# Patient Record
Sex: Female | Born: 1950 | Race: Black or African American | Hispanic: No | Marital: Single | Smoking: Former smoker
Health system: Southern US, Community
[De-identification: ages and names within clinical notes are randomized; demographics above are authoritative.]

## PROBLEM LIST (undated history)

## (undated) ENCOUNTER — Emergency Department (HOSPITAL_BASED_OUTPATIENT_CLINIC_OR_DEPARTMENT_OTHER): Admission: EM | Discharge status: Absent without leave | Payer: Medicare Other

## (undated) DIAGNOSIS — E785 Hyperlipidemia, unspecified: Secondary | ICD-10-CM

## (undated) DIAGNOSIS — Z860100 Personal history of colon polyps, unspecified: Secondary | ICD-10-CM

## (undated) DIAGNOSIS — M1990 Unspecified osteoarthritis, unspecified site: Secondary | ICD-10-CM

## (undated) DIAGNOSIS — C50919 Malignant neoplasm of unspecified site of unspecified female breast: Secondary | ICD-10-CM

## (undated) DIAGNOSIS — R9430 Abnormal result of cardiovascular function study, unspecified: Secondary | ICD-10-CM

## (undated) DIAGNOSIS — I219 Acute myocardial infarction, unspecified: Secondary | ICD-10-CM

## (undated) DIAGNOSIS — I10 Essential (primary) hypertension: Secondary | ICD-10-CM

## (undated) DIAGNOSIS — M5430 Sciatica, unspecified side: Secondary | ICD-10-CM

## (undated) DIAGNOSIS — G8929 Other chronic pain: Secondary | ICD-10-CM

## (undated) DIAGNOSIS — Z5189 Encounter for other specified aftercare: Secondary | ICD-10-CM

## (undated) DIAGNOSIS — F1010 Alcohol abuse, uncomplicated: Secondary | ICD-10-CM

## (undated) DIAGNOSIS — M549 Dorsalgia, unspecified: Secondary | ICD-10-CM

## (undated) DIAGNOSIS — Z720 Tobacco use: Secondary | ICD-10-CM

## (undated) DIAGNOSIS — E663 Overweight: Secondary | ICD-10-CM

## (undated) DIAGNOSIS — IMO0001 Reserved for inherently not codable concepts without codable children: Secondary | ICD-10-CM

## (undated) DIAGNOSIS — G4730 Sleep apnea, unspecified: Secondary | ICD-10-CM

## (undated) DIAGNOSIS — I2510 Atherosclerotic heart disease of native coronary artery without angina pectoris: Secondary | ICD-10-CM

## (undated) DIAGNOSIS — R112 Nausea with vomiting, unspecified: Secondary | ICD-10-CM

## (undated) DIAGNOSIS — I509 Heart failure, unspecified: Secondary | ICD-10-CM

## (undated) DIAGNOSIS — Z951 Presence of aortocoronary bypass graft: Secondary | ICD-10-CM

## (undated) DIAGNOSIS — N390 Urinary tract infection, site not specified: Secondary | ICD-10-CM

## (undated) HISTORY — DX: Tobacco use: Z72.0

## (undated) HISTORY — DX: Personal history of colonic polyps: Z86.010

## (undated) HISTORY — DX: Presence of aortocoronary bypass graft: Z95.1

## (undated) HISTORY — DX: Alcohol abuse, uncomplicated: F10.10

## (undated) HISTORY — DX: Encounter for other specified aftercare: Z51.89

## (undated) HISTORY — DX: Nausea with vomiting, unspecified: R11.2

## (undated) HISTORY — DX: Atherosclerotic heart disease of native coronary artery without angina pectoris: I25.10

## (undated) HISTORY — DX: Unspecified osteoarthritis, unspecified site: M19.90

## (undated) HISTORY — DX: Sleep apnea, unspecified: G47.30

## (undated) HISTORY — PX: CORONARY STENT PLACEMENT: SHX1402

## (undated) HISTORY — DX: Hyperlipidemia, unspecified: E78.5

## (undated) HISTORY — DX: Malignant neoplasm of unspecified site of unspecified female breast: C50.919

## (undated) HISTORY — DX: Personal history of colon polyps, unspecified: Z86.0100

## (undated) HISTORY — DX: Essential (primary) hypertension: I10

## (undated) HISTORY — DX: Overweight: E66.3

## (undated) HISTORY — DX: Abnormal result of cardiovascular function study, unspecified: R94.30

---

## 1981-09-14 HISTORY — PX: GASTRIC BYPASS: SHX52

## 1982-09-14 DIAGNOSIS — Z5189 Encounter for other specified aftercare: Secondary | ICD-10-CM

## 1982-09-14 HISTORY — DX: Encounter for other specified aftercare: Z51.89

## 1985-09-14 HISTORY — PX: MASTECTOMY: SHX3

## 1987-09-15 HISTORY — PX: ABDOMINAL HYSTERECTOMY: SHX81

## 2004-03-19 ENCOUNTER — Emergency Department (HOSPITAL_COMMUNITY): Admission: EM | Admit: 2004-03-19 | Discharge: 2004-03-19 | Payer: Self-pay | Admitting: Emergency Medicine

## 2004-06-14 ENCOUNTER — Emergency Department (HOSPITAL_COMMUNITY): Admission: EM | Admit: 2004-06-14 | Discharge: 2004-06-15 | Payer: Self-pay | Admitting: Emergency Medicine

## 2004-09-04 ENCOUNTER — Emergency Department (HOSPITAL_COMMUNITY): Admission: EM | Admit: 2004-09-04 | Discharge: 2004-09-04 | Payer: Self-pay | Admitting: Emergency Medicine

## 2005-03-11 ENCOUNTER — Emergency Department (HOSPITAL_COMMUNITY): Admission: EM | Admit: 2005-03-11 | Discharge: 2005-03-11 | Payer: Self-pay | Admitting: Emergency Medicine

## 2005-03-28 ENCOUNTER — Emergency Department (HOSPITAL_COMMUNITY): Admission: EM | Admit: 2005-03-28 | Discharge: 2005-03-29 | Payer: Self-pay | Admitting: Emergency Medicine

## 2005-04-14 ENCOUNTER — Emergency Department (HOSPITAL_COMMUNITY): Admission: EM | Admit: 2005-04-14 | Discharge: 2005-04-14 | Payer: Self-pay | Admitting: Emergency Medicine

## 2005-07-16 ENCOUNTER — Emergency Department (HOSPITAL_COMMUNITY): Admission: EM | Admit: 2005-07-16 | Discharge: 2005-07-17 | Payer: Self-pay | Admitting: *Deleted

## 2006-05-24 ENCOUNTER — Ambulatory Visit: Payer: Self-pay | Admitting: Cardiology

## 2006-05-24 ENCOUNTER — Inpatient Hospital Stay (HOSPITAL_COMMUNITY): Admission: EM | Admit: 2006-05-24 | Discharge: 2006-06-05 | Payer: Self-pay | Admitting: *Deleted

## 2006-05-26 ENCOUNTER — Encounter: Payer: Self-pay | Admitting: Vascular Surgery

## 2006-05-30 ENCOUNTER — Encounter: Payer: Self-pay | Admitting: Surgery

## 2006-06-17 ENCOUNTER — Ambulatory Visit: Payer: Self-pay | Admitting: Cardiology

## 2006-07-23 ENCOUNTER — Ambulatory Visit: Payer: Self-pay | Admitting: Cardiology

## 2006-10-29 ENCOUNTER — Ambulatory Visit: Payer: Self-pay | Admitting: Nurse Practitioner

## 2006-10-29 ENCOUNTER — Observation Stay (HOSPITAL_COMMUNITY): Admission: EM | Admit: 2006-10-29 | Discharge: 2006-10-30 | Payer: Self-pay | Admitting: Emergency Medicine

## 2006-11-11 ENCOUNTER — Ambulatory Visit: Payer: Self-pay

## 2006-11-16 ENCOUNTER — Ambulatory Visit: Payer: Self-pay

## 2006-11-16 ENCOUNTER — Ambulatory Visit: Payer: Self-pay | Admitting: Cardiology

## 2006-12-02 ENCOUNTER — Encounter (HOSPITAL_COMMUNITY): Admission: RE | Admit: 2006-12-02 | Discharge: 2007-01-24 | Payer: Self-pay | Admitting: Cardiology

## 2007-02-11 ENCOUNTER — Ambulatory Visit: Payer: Self-pay | Admitting: Oncology

## 2007-03-17 LAB — COMPREHENSIVE METABOLIC PANEL
ALT: 12 U/L (ref 0–35)
AST: 15 U/L (ref 0–37)
Albumin: 4.3 g/dL (ref 3.5–5.2)
BUN: 15 mg/dL (ref 6–23)
CO2: 25 mEq/L (ref 19–32)
Calcium: 9.6 mg/dL (ref 8.4–10.5)
Chloride: 107 mEq/L (ref 96–112)
Potassium: 4.2 mEq/L (ref 3.5–5.3)

## 2007-03-17 LAB — CBC WITH DIFFERENTIAL/PLATELET
BASO%: 0.5 % (ref 0.0–2.0)
Basophils Absolute: 0 10*3/uL (ref 0.0–0.1)
EOS%: 0.8 % (ref 0.0–7.0)
HCT: 38.7 % (ref 34.8–46.6)
HGB: 13.2 g/dL (ref 11.6–15.9)
MCH: 28.2 pg (ref 26.0–34.0)
MONO#: 0.5 10*3/uL (ref 0.1–0.9)
NEUT#: 2.7 10*3/uL (ref 1.5–6.5)
RDW: 15.7 % — ABNORMAL HIGH (ref 11.3–14.5)
WBC: 6.6 10*3/uL (ref 3.9–10.0)
lymph#: 3.3 10*3/uL (ref 0.9–3.3)

## 2007-03-17 LAB — LACTATE DEHYDROGENASE: LDH: 209 U/L (ref 94–250)

## 2007-04-18 ENCOUNTER — Ambulatory Visit: Payer: Self-pay | Admitting: Oncology

## 2007-04-28 ENCOUNTER — Encounter: Admission: RE | Admit: 2007-04-28 | Discharge: 2007-04-28 | Payer: Self-pay | Admitting: Oncology

## 2007-07-18 ENCOUNTER — Emergency Department (HOSPITAL_COMMUNITY): Admission: EM | Admit: 2007-07-18 | Discharge: 2007-07-18 | Payer: Self-pay | Admitting: Emergency Medicine

## 2007-07-20 ENCOUNTER — Ambulatory Visit: Payer: Self-pay | Admitting: Oncology

## 2007-08-05 ENCOUNTER — Emergency Department (HOSPITAL_COMMUNITY): Admission: EM | Admit: 2007-08-05 | Discharge: 2007-08-05 | Payer: Self-pay | Admitting: Emergency Medicine

## 2007-09-15 HISTORY — PX: CORONARY ARTERY BYPASS GRAFT: SHX141

## 2007-09-16 ENCOUNTER — Ambulatory Visit: Payer: Self-pay | Admitting: Gastroenterology

## 2007-10-30 ENCOUNTER — Emergency Department (HOSPITAL_COMMUNITY): Admission: EM | Admit: 2007-10-30 | Discharge: 2007-10-30 | Payer: Self-pay | Admitting: Emergency Medicine

## 2007-12-09 ENCOUNTER — Emergency Department (HOSPITAL_COMMUNITY): Admission: EM | Admit: 2007-12-09 | Discharge: 2007-12-09 | Payer: Self-pay | Admitting: Emergency Medicine

## 2007-12-13 ENCOUNTER — Emergency Department (HOSPITAL_COMMUNITY): Admission: EM | Admit: 2007-12-13 | Discharge: 2007-12-14 | Payer: Self-pay | Admitting: Emergency Medicine

## 2008-01-24 ENCOUNTER — Emergency Department (HOSPITAL_COMMUNITY): Admission: EM | Admit: 2008-01-24 | Discharge: 2008-01-24 | Payer: Self-pay | Admitting: Emergency Medicine

## 2008-02-14 ENCOUNTER — Emergency Department (HOSPITAL_COMMUNITY): Admission: EM | Admit: 2008-02-14 | Discharge: 2008-02-14 | Payer: Self-pay | Admitting: Emergency Medicine

## 2008-06-23 ENCOUNTER — Inpatient Hospital Stay (HOSPITAL_COMMUNITY): Admission: AD | Admit: 2008-06-23 | Discharge: 2008-06-24 | Payer: Self-pay | Admitting: Obstetrics & Gynecology

## 2008-07-23 ENCOUNTER — Ambulatory Visit: Payer: Self-pay | Admitting: Oncology

## 2008-07-23 LAB — COMPREHENSIVE METABOLIC PANEL
ALT: 16 U/L (ref 0–35)
BUN: 13 mg/dL (ref 6–23)
CO2: 25 mEq/L (ref 19–32)
Calcium: 9.1 mg/dL (ref 8.4–10.5)
Chloride: 106 mEq/L (ref 96–112)
Creatinine, Ser: 0.92 mg/dL (ref 0.40–1.20)
Glucose, Bld: 125 mg/dL — ABNORMAL HIGH (ref 70–99)
Total Bilirubin: 0.4 mg/dL (ref 0.3–1.2)

## 2008-07-23 LAB — CBC WITH DIFFERENTIAL/PLATELET
Basophils Absolute: 0 10*3/uL (ref 0.0–0.1)
Eosinophils Absolute: 0.1 10*3/uL (ref 0.0–0.5)
HCT: 39.1 % (ref 34.8–46.6)
HGB: 13.1 g/dL (ref 11.6–15.9)
LYMPH%: 47.5 % (ref 14.0–48.0)
MCHC: 33.4 g/dL (ref 32.0–36.0)
MONO#: 0.4 10*3/uL (ref 0.1–0.9)
NEUT%: 42.1 % (ref 39.6–76.8)
Platelets: 183 10*3/uL (ref 145–400)
WBC: 4.8 10*3/uL (ref 3.9–10.0)
lymph#: 2.3 10*3/uL (ref 0.9–3.3)

## 2008-07-23 LAB — LACTATE DEHYDROGENASE: LDH: 200 U/L (ref 94–250)

## 2008-10-19 ENCOUNTER — Ambulatory Visit: Payer: Self-pay | Admitting: Oncology

## 2008-10-23 ENCOUNTER — Ambulatory Visit: Payer: Self-pay | Admitting: Cardiology

## 2008-10-23 LAB — COMPREHENSIVE METABOLIC PANEL
Alkaline Phosphatase: 97 U/L (ref 39–117)
CO2: 25 mEq/L (ref 19–32)
Creatinine, Ser: 0.84 mg/dL (ref 0.40–1.20)
Glucose, Bld: 113 mg/dL — ABNORMAL HIGH (ref 70–99)
Sodium: 139 mEq/L (ref 135–145)
Total Bilirubin: 0.6 mg/dL (ref 0.3–1.2)
Total Protein: 7.1 g/dL (ref 6.0–8.3)

## 2008-10-23 LAB — CBC WITH DIFFERENTIAL/PLATELET
BASO%: 1.2 % (ref 0.0–2.0)
Eosinophils Absolute: 0 10*3/uL (ref 0.0–0.5)
HCT: 38.7 % (ref 34.8–46.6)
LYMPH%: 53.9 % — ABNORMAL HIGH (ref 14.0–48.0)
MCHC: 33.5 g/dL (ref 32.0–36.0)
MCV: 84 fL (ref 81.0–101.0)
MONO%: 6.2 % (ref 0.0–13.0)
NEUT%: 38.1 % — ABNORMAL LOW (ref 39.6–76.8)
Platelets: 237 10*3/uL (ref 145–400)
RBC: 4.61 10*6/uL (ref 3.70–5.32)

## 2008-10-23 LAB — LACTATE DEHYDROGENASE: LDH: 194 U/L (ref 94–250)

## 2008-11-09 ENCOUNTER — Emergency Department (HOSPITAL_COMMUNITY): Admission: EM | Admit: 2008-11-09 | Discharge: 2008-11-09 | Payer: Self-pay | Admitting: Emergency Medicine

## 2008-11-14 ENCOUNTER — Ambulatory Visit: Payer: Self-pay | Admitting: Gastroenterology

## 2008-11-28 ENCOUNTER — Encounter: Payer: Self-pay | Admitting: Gastroenterology

## 2008-11-28 ENCOUNTER — Ambulatory Visit: Payer: Self-pay | Admitting: Gastroenterology

## 2008-12-03 ENCOUNTER — Encounter: Payer: Self-pay | Admitting: Gastroenterology

## 2009-01-17 ENCOUNTER — Ambulatory Visit: Payer: Self-pay | Admitting: Cardiology

## 2009-01-30 ENCOUNTER — Emergency Department (HOSPITAL_COMMUNITY): Admission: EM | Admit: 2009-01-30 | Discharge: 2009-01-30 | Payer: Self-pay | Admitting: Emergency Medicine

## 2009-02-24 ENCOUNTER — Emergency Department (HOSPITAL_COMMUNITY): Admission: EM | Admit: 2009-02-24 | Discharge: 2009-02-24 | Payer: Self-pay | Admitting: Emergency Medicine

## 2009-03-02 ENCOUNTER — Emergency Department (HOSPITAL_COMMUNITY): Admission: EM | Admit: 2009-03-02 | Discharge: 2009-03-03 | Payer: Self-pay | Admitting: Internal Medicine

## 2009-04-25 ENCOUNTER — Ambulatory Visit: Payer: Self-pay | Admitting: Oncology

## 2009-04-29 LAB — COMPREHENSIVE METABOLIC PANEL
AST: 18 U/L (ref 0–37)
Albumin: 4.3 g/dL (ref 3.5–5.2)
Alkaline Phosphatase: 101 U/L (ref 39–117)
Potassium: 4.2 mEq/L (ref 3.5–5.3)
Sodium: 141 mEq/L (ref 135–145)
Total Bilirubin: 0.5 mg/dL (ref 0.3–1.2)
Total Protein: 7 g/dL (ref 6.0–8.3)

## 2009-04-29 LAB — CBC WITH DIFFERENTIAL/PLATELET
BASO%: 0.4 % (ref 0.0–2.0)
EOS%: 0.6 % (ref 0.0–7.0)
Eosinophils Absolute: 0 10*3/uL (ref 0.0–0.5)
LYMPH%: 51.8 % — ABNORMAL HIGH (ref 14.0–49.7)
MCH: 27.8 pg (ref 25.1–34.0)
MCHC: 32.7 g/dL (ref 31.5–36.0)
MCV: 85.1 fL (ref 79.5–101.0)
MONO%: 7.1 % (ref 0.0–14.0)
NEUT#: 2.1 10*3/uL (ref 1.5–6.5)
RBC: 4.52 10*6/uL (ref 3.70–5.45)
RDW: 16.7 % — ABNORMAL HIGH (ref 11.2–14.5)

## 2009-05-16 ENCOUNTER — Observation Stay (HOSPITAL_COMMUNITY): Admission: EM | Admit: 2009-05-16 | Discharge: 2009-05-17 | Payer: Self-pay | Admitting: Emergency Medicine

## 2009-05-16 ENCOUNTER — Ambulatory Visit: Payer: Self-pay | Admitting: Internal Medicine

## 2009-05-22 ENCOUNTER — Telehealth (INDEPENDENT_AMBULATORY_CARE_PROVIDER_SITE_OTHER): Payer: Self-pay | Admitting: *Deleted

## 2009-05-23 ENCOUNTER — Ambulatory Visit: Payer: Self-pay

## 2009-05-27 ENCOUNTER — Encounter (INDEPENDENT_AMBULATORY_CARE_PROVIDER_SITE_OTHER): Payer: Self-pay

## 2009-05-27 ENCOUNTER — Ambulatory Visit: Payer: Self-pay

## 2009-06-02 ENCOUNTER — Encounter: Payer: Self-pay | Admitting: Cardiology

## 2009-06-04 ENCOUNTER — Encounter (INDEPENDENT_AMBULATORY_CARE_PROVIDER_SITE_OTHER): Payer: Self-pay | Admitting: *Deleted

## 2009-06-05 ENCOUNTER — Telehealth (INDEPENDENT_AMBULATORY_CARE_PROVIDER_SITE_OTHER): Payer: Self-pay

## 2009-06-11 ENCOUNTER — Ambulatory Visit: Payer: Self-pay

## 2009-06-11 ENCOUNTER — Encounter: Payer: Self-pay | Admitting: Cardiology

## 2009-07-15 ENCOUNTER — Emergency Department (HOSPITAL_COMMUNITY): Admission: EM | Admit: 2009-07-15 | Discharge: 2009-07-16 | Payer: Self-pay | Admitting: Emergency Medicine

## 2009-08-14 ENCOUNTER — Telehealth (INDEPENDENT_AMBULATORY_CARE_PROVIDER_SITE_OTHER): Payer: Self-pay | Admitting: *Deleted

## 2009-09-11 ENCOUNTER — Emergency Department (HOSPITAL_COMMUNITY): Admission: EM | Admit: 2009-09-11 | Discharge: 2009-09-11 | Payer: Self-pay | Admitting: Emergency Medicine

## 2009-10-28 ENCOUNTER — Ambulatory Visit: Payer: Self-pay | Admitting: Oncology

## 2009-10-31 LAB — COMPREHENSIVE METABOLIC PANEL
ALT: 24 U/L (ref 0–35)
AST: 30 U/L (ref 0–37)
Albumin: 3.9 g/dL (ref 3.5–5.2)
Calcium: 8.6 mg/dL (ref 8.4–10.5)
Chloride: 106 mEq/L (ref 96–112)
Potassium: 4.1 mEq/L (ref 3.5–5.3)
Sodium: 140 mEq/L (ref 135–145)

## 2009-10-31 LAB — CBC WITH DIFFERENTIAL/PLATELET
BASO%: 0.2 % (ref 0.0–2.0)
EOS%: 0.1 % (ref 0.0–7.0)
MCH: 28.5 pg (ref 25.1–34.0)
MCHC: 33.2 g/dL (ref 31.5–36.0)
RDW: 16.5 % — ABNORMAL HIGH (ref 11.2–14.5)
lymph#: 1.1 10*3/uL (ref 0.9–3.3)

## 2009-11-28 ENCOUNTER — Encounter (INDEPENDENT_AMBULATORY_CARE_PROVIDER_SITE_OTHER): Payer: Self-pay | Admitting: *Deleted

## 2009-12-24 ENCOUNTER — Encounter: Payer: Self-pay | Admitting: Cardiology

## 2010-01-15 ENCOUNTER — Encounter: Payer: Self-pay | Admitting: Cardiology

## 2010-01-16 ENCOUNTER — Ambulatory Visit: Payer: Self-pay | Admitting: Cardiology

## 2010-02-20 ENCOUNTER — Ambulatory Visit: Payer: Self-pay | Admitting: Cardiology

## 2010-02-20 ENCOUNTER — Emergency Department (HOSPITAL_COMMUNITY): Admission: EM | Admit: 2010-02-20 | Discharge: 2010-02-20 | Payer: Self-pay | Admitting: Emergency Medicine

## 2010-04-17 ENCOUNTER — Emergency Department (HOSPITAL_COMMUNITY): Admission: EM | Admit: 2010-04-17 | Discharge: 2010-04-18 | Payer: Self-pay | Admitting: Emergency Medicine

## 2010-04-30 ENCOUNTER — Inpatient Hospital Stay (HOSPITAL_COMMUNITY): Admission: AD | Admit: 2010-04-30 | Discharge: 2010-04-30 | Payer: Self-pay | Admitting: Obstetrics & Gynecology

## 2010-06-12 ENCOUNTER — Emergency Department (HOSPITAL_COMMUNITY): Admission: EM | Admit: 2010-06-12 | Discharge: 2010-06-12 | Payer: Self-pay | Admitting: Emergency Medicine

## 2010-07-16 ENCOUNTER — Encounter: Payer: Self-pay | Admitting: Cardiology

## 2010-07-16 ENCOUNTER — Inpatient Hospital Stay (HOSPITAL_COMMUNITY): Admission: AD | Admit: 2010-07-16 | Discharge: 2010-07-20 | Payer: Self-pay | Admitting: Cardiology

## 2010-07-16 ENCOUNTER — Ambulatory Visit: Payer: Self-pay | Admitting: Cardiology

## 2010-07-17 ENCOUNTER — Ambulatory Visit: Payer: Self-pay | Admitting: Internal Medicine

## 2010-07-17 ENCOUNTER — Encounter: Payer: Self-pay | Admitting: Cardiology

## 2010-07-18 ENCOUNTER — Encounter: Payer: Self-pay | Admitting: Gastroenterology

## 2010-07-19 ENCOUNTER — Encounter: Payer: Self-pay | Admitting: Gastroenterology

## 2010-07-21 ENCOUNTER — Emergency Department (HOSPITAL_COMMUNITY): Admission: EM | Admit: 2010-07-21 | Discharge: 2010-07-21 | Payer: Self-pay | Admitting: Emergency Medicine

## 2010-07-21 ENCOUNTER — Encounter: Payer: Self-pay | Admitting: Cardiology

## 2010-08-11 ENCOUNTER — Encounter: Payer: Self-pay | Admitting: Cardiology

## 2010-08-12 ENCOUNTER — Ambulatory Visit: Payer: Self-pay | Admitting: Cardiology

## 2010-10-06 ENCOUNTER — Encounter (HOSPITAL_COMMUNITY): Payer: Self-pay | Admitting: Oncology

## 2010-10-15 NOTE — Consult Note (Signed)
[facility]   Imported By: Roderic Ovens 03/07/2010 16:15:12  _____________________________________________________________________  External Attachment:    Type:   Image     Comment:   External Document

## 2010-10-15 NOTE — Letter (Signed)
Summary: Appointment - Reminder 2  [facility], Main Office  [address]   [place], [place] [address]   Phone: [phone]  Fax: [phone]     November 28, 2009 MRN: [id number]   Patricia Cuevas [address] [place], [place]  [address]   Dear [name],  Our records indicate that it is time to schedule a follow-up appointment with Dr. Myrtis Ser. It is very important that we reach you to schedule this appointment. We look forward to participating in your health care needs. Please contact us at the number listed above at your earliest convenience to schedule your appointment.  If you are unable to make an appointment at this time, give [place] a call so we can update our records.     Sincerely,   [facility] Scheduling Team

## 2010-10-15 NOTE — Assessment & Plan Note (Signed)
Summary: dod add-on CP/lwb  Medications Added HYDROCODONE-ACETAMINOPHEN 10-325 MG TABS (HYDROCODONE-ACETAMINOPHEN) as needed ALBUTEROL SULFATE (2.5 MG/3ML) 0.083% NEBU (ALBUTEROL SULFATE) as needed      Allergies Added: NKDA  Visit Type:  Follow-up Primary Provider:  Ellison Hughs  CC:  Add on - Chest pains.  History of Present Illness: Yesterday she was having quite a bit of pain, with pain in R shoulder radiating to R arm  This was not like when she first had a heart attack.  She recently saw Dr. Myrtis Ser for these symptoms, and she was told to go to the ER.  NTG helped, and then went away.  She has had more problems recently, and the symptoms have been the same.   She sometimes will get short of breath.   She is getting tired more easily.  No pain at rest.  Patient does not smoke.  She stopped smoking about four years ago.    Current Medications (verified): 1)  Lipitor 80 Mg Tabs (Atorvastatin Calcium) .... Take One Tablet By Mouth Daily. 2)  Metoprolol Tartrate 25 Mg Tabs (Metoprolol Tartrate) .... Take One Tablet By Mouth Twice A Day 3)  Lasix 40 Mg Tabs (Furosemide) .[name] Kitchen.. 1 Once Daily 4)  Aspirin 81 Mg Tbec (Aspirin) .... Take One Tablet By Mouth Daily 5)  Lisinopril-Hydrochlorothiazide 20-12.5 Mg Tabs (Lisinopril-Hydrochlorothiazide) .[name] Kitchen.. 1 Once Daily 6)  Metformin Hcl 500 Mg Tabs (Metformin Hcl) .... Take 1 Once Daily 7)  Fosamax 70 Mg Tabs (Alendronate Sodium) .[name] Kitchen.. 1 Q Week 8)  Imdur 60 Mg Xr24h-Tab (Isosorbide Mononitrate) .[name] Kitchen.. 1 Once Daily 9)  Hydrocodone-Acetaminophen 10-325 Mg Tabs (Hydrocodone-Acetaminophen) .... As Needed 10)  Albuterol Sulfate (2.5 Mg/44ml) 0.083% Nebu (Albuterol Sulfate) .... As Needed  Allergies (verified): No Known Drug Allergies  Past History:  Past Medical History: Last updated: 01/16/2010 Dyslipidemia LV FUNCTION....normal  /  No echo data as of Jan 15, 2010 CAD.[name] KitchenMarland KitchenMyoview March, 2008... no scar or ischemia... normal EF /  nuclear...  September, 2010... no scar or ischemia.... ejection fraction 64% CABG  September, 2007.[name] KitchenLIMA to the LAD, SVG to diagonal, sequential SVG OM one and OM 2, sequential SVG distal RCA and PDA diabetes Overweight Tobacco and alcohol abuse in the past.... resolved stomach stapling 1985 followed by weight loss and then return of weight Status post hysterectomy Breast cancer status post bilateral mastectomy Osteoporosis Obstructive sleep apnea.... CPAP being arranged as of May, 2011  Past Surgical History: CABG 2007  Family History: Reviewed history from 12/08/2008 and no changes required. Mother deceased.  Had a history of hypertension,  diabetes, heart disease.  Father deceased secondary to coronary artery  disease.  Siblings:  One sister with coronary artery disease.  Social History: Reviewed history from 12/08/2008 and no changes required. Disabled  Divorced  Tobacco Use - Former.   Vital Signs:  Patient profile:   60 year old female Height:      59 inches Weight:      222 pounds BMI:     45.00 Pulse rate:   62 / minute Pulse rhythm:   regular Resp:     18 per minute BP sitting:   142 / 80  (left arm) Cuff size:   large  Vitals Entered By: Vikki Ports (July 16, 2010 12:03 PM)  Physical Exam  General:  Well developed, well nourished, in no acute distress. Head:  normocephalic and atraumatic Eyes:  PERRLA/EOM intact; conjunctiva and lids normal. Lungs:  Clear bilaterally to auscultation and percussion. Heart:  PMI non  displaced.  Normal S1 and S2.  No murmur Pulses:  pulses normal in all 4 extremities Extremities:  No clubbing or cyanosis. Neurologic:  Alert and oriented x 3.  No focal abnormalities.    EKG  Procedure date:  07/16/2010  Findings:      NSR.  Nonspecific T abnormality.  Impression & Recommendations:  Problem # 1:  CORONARY ARTERY BYPASS GRAFT, HX OF (ICD-V45.81) Prior CABG as noted.  No definite ECG changes.  Needs enzymes and rule out.   Favor restudy to assess coronary anatomy.  Symptoms releived by NTG.  Reviewed risks with patient, and she consents. She was scheduled for an endo tomorrow.    Problem # 2:  * BRADYCARDIA WITH SLEEP STUDY see report  Problem # 3:  DYSLIPIDEMIA (ICD-272.4) on LLT Her updated medication list for this problem includes:    Lipitor 80 Mg Tabs (Atorvastatin calcium) .[name] Kitchen... Take one tablet by mouth daily.  Problem # 4:  DM (ICD-250.00) On metformin.  Will hold for cath.  Her updated medication list for this problem includes:    Aspirin 81 Mg Tbec (Aspirin) .[name] Kitchen... Take one tablet by mouth daily    Lisinopril-hydrochlorothiazide 20-12.5 Mg Tabs (Lisinopril-hydrochlorothiazide) .[name] Kitchen... 1 once daily    Metformin Hcl 500 Mg Tabs (Metformin hcl) .[name] Kitchen... Take 1 once daily

## 2010-10-15 NOTE — Procedures (Signed)
Summary: Upper Endoscopy  Patient: Patricia Cuevas Note: All result statuses are Final unless otherwise noted.  Tests: (1) Upper Endoscopy (EGD)   EGD Upper Endoscopy       DONE     [facility]     [address]     [place], [place]  [address]           ENDOSCOPY PROCEDURE REPORT           PATIENT:  Patricia Cuevas, Patricia Cuevas  MR#:  [id number]     BIRTHDATE:  11-Jan-1951, 59 yrs. old  GENDER:  female           ENDOSCOPIST:  Barbette Hair. Arlyce Dice, MD     Referred by:           PROCEDURE DATE:  07/18/2010     PROCEDURE:  EGD, diagnostic 43235, Maloney Dilation of Esophagus     ASA CLASS:  Class II     INDICATIONS:  chest pain, dysphagia           MEDICATIONS:   Fentanyl 75 mcg IV, Versed 6 mg IV, glycopyrrolate     (Robinal) 0.2 mg IV     TOPICAL ANESTHETIC:  Cetacaine Spray           DESCRIPTION OF PROCEDURE:   After the risks benefits and     alternatives of the procedure were thoroughly explained, informed     consent was obtained.  The EG-[id number] ([id number]) endoscope was     introduced through the mouth and advanced to the , without     limitations.  The instrument was slowly withdrawn as the mucosa     was fully examined.     <<PROCEDUREIMAGES>>           A stricture was found at the gastroesophageal junction. Minimal     narrowing GE junction. Dilation with maloney dilator 18mm Minimal     resistance; no heme  Post-operative change was noted. Gastric     pouch with questionable gastrojejunostomy. Unable to pass scope     through either because of positioning or possibly stenosis (see     image002).    Retroflexed views revealed no abnormalities.    The     scope was then withdrawn from the patient and the procedure     completed.           COMPLICATIONS:  None           ENDOSCOPIC IMPRESSION:     1) Stricture at the gastroesophageal junction     2) Post-operative change - questionable stenosis at     gastrojejunostomy     RECOMMENDATIONS:     1) continue PPI   2) hold fosamax     3) UGI series           REPEAT EXAM:  No           ______________________________     Barbette Hair. Arlyce Dice, MD           CC:  Herby Abraham, MD           n.     Rosalie Doctor:   Barbette Hair. Leiya Keesey at 07/18/2010 03:54 PM           Christoper Allegra, [id number]  Note: An exclamation mark (!) indicates a result that was not dispersed into the flowsheet. Document Creation Date: 07/18/2010 3:55 PM _______________________________________________________________________  (1) Order result status: Final Collection or observation date-time: 07/18/2010 15:46 Requested date-time:  Receipt date-time:  Reported date-time:  Referring Physician:   Ordering Physician: Melvia Heaps [phone]) Specimen Source:  Source: Launa Grill Order Number: [id number] Lab site:

## 2010-10-15 NOTE — Miscellaneous (Signed)
  Clinical Lists Changes  Problems: Added new problem of DM (ICD-250.00) Observations: Added new observation of PAST MED HX: Dyslipidemia LV FUNCTION....normal  /  No echo data as of Jan 15, 2010 CAD.[name] KitchenMarland KitchenMyoview March, 2008... no scar or ischemia... normal EF CABG  September, 2007.[name] KitchenLIMA to the LAD, SVG to diagonal, sequential SVG OM one and OM 2, sequential SVG distal RCA and PDA diabetes Overweight Tobacco and alcohol abuse in the past.... resolved stomach stapling 1985 followed by weight loss and then return of weight Status post hysterectomy Breast cancer status post bilateral mastectomy Osteoporosis (01/15/2010 12:58) Added new observation of PRIMARY MD: Ellison Hughs (01/15/2010 12:58)       Past History:  Past Medical History: Dyslipidemia LV FUNCTION....normal  /  No echo data as of Jan 15, 2010 CAD.[name] KitchenMarland KitchenMyoview March, 2008... no scar or ischemia... normal EF CABG  September, 2007.[name] KitchenLIMA to the LAD, SVG to diagonal, sequential SVG OM one and OM 2, sequential SVG distal RCA and PDA diabetes Overweight Tobacco and alcohol abuse in the past.... resolved stomach stapling 1985 followed by weight loss and then return of weight Status post hysterectomy Breast cancer status post bilateral mastectomy Osteoporosis

## 2010-10-15 NOTE — Cardiovascular Report (Signed)
Summary: Pre Cath/Percutaneous Orders   Pre Cath/Percutaneous Orders   Imported By: Roderic Ovens 07/28/2010 12:22:32  _____________________________________________________________________  External Attachment:    Type:   Image     Comment:   External Document

## 2010-10-15 NOTE — Assessment & Plan Note (Signed)
Summary: eph/blockage/mt   Visit Type:  post hospital visit Primary Adelia Baptista:  Vedia Coffer, MSN, NP  CC:  CAD.  History of Present Illness: The patient is seen for a post hospital visit.  She had been admitted on July 16, 2010.  I have reviewed the hospital records.  She had significant nausea and vomiting.  There was concern that this could be both cardiac or GI.  A cardiac workup included repeat catheterization.  The results are outlined in the past medical history.  The patient did have one occluded vein graft to the diagonal.  Medical therapy was recommended.  She had a complete GI workup.  There were significant GI findings in her meds were adjusted.  She is seen GI followup as an outpatient.  She feels much better.  Current Medications (verified): 1)  Lipitor 80 Mg Tabs (Atorvastatin Calcium) .... Take One Tablet By Mouth Daily. 2)  Metoprolol Tartrate 25 Mg Tabs (Metoprolol Tartrate) .... Take One Tablet By Mouth Twice A Day 3)  Lasix 40 Mg Tabs (Furosemide) .[name] Kitchen.. 1 Once Daily 4)  Aspirin 81 Mg Tbec (Aspirin) .... Take One Tablet By Mouth Daily 5)  Lisinopril-Hydrochlorothiazide 20-12.5 Mg Tabs (Lisinopril-Hydrochlorothiazide) .[name] Kitchen.. 1 Once Daily 6)  Metformin Hcl 500 Mg Tabs (Metformin Hcl) .... Take 1 Once Daily 7)  Imdur 60 Mg Xr24h-Tab (Isosorbide Mononitrate) .[name] Kitchen.. 1 Once Daily 8)  Hydrocodone-Acetaminophen 10-325 Mg Tabs (Hydrocodone-Acetaminophen) .... As Needed 9)  Albuterol Sulfate (2.5 Mg/50ml) 0.083% Nebu (Albuterol Sulfate) .... As Needed 10)  Aciphex 20 Mg Tbec (Rabeprazole Sodium) .... Take 1 Tablet By Mouth Once A Day  Allergies (verified): No Known Drug Allergies  Past History:  Past Medical History: Dyslipidemia LV FUNCTION....normal  /  No echo data as of Jan 15, 2010 /  normal.... catheterization... November, 2011 CAD.[name] KitchenMarland KitchenMyoview March, 2008... no scar or ischemia... normal EF /  nuclear... September, 2010... no scar or ischemia.... ejection fraction 64% /   catheterization November 4,2011..patent LIMA to the LAD .[name] Kitchen small caliber distal vessel with diffuse plaque, patent SVG to first and second OM, patent SVG to PDA and PLA, occluded SVG to diagonal, medical therapy recommended CABG  September, 2007.[name] KitchenLIMA to the LAD, SVG to diagonal, sequential SVG OM one and OM 2, sequential SVG distal RCA and PDA diabetes Overweight Tobacco and alcohol abuse in the past.... resolved stomach stapling 1985 followed by weight loss and then return of weight Status post hysterectomy.. Breast cancer status post bilateral mastectomy Osteoporosis Obstructive sleep apnea.... CPAP being arranged as of May, 2011 Nausea, vomiting,... during hospitalization November, 2011... endoscopy.. 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  All other systems are reviewed and are negative.  Vital Signs:  Patient profile:   60 year old female Height:      59 inches Weight:      222 pounds BMI:     45.00 Pulse rate:   75 / minute BP sitting:   112 / 66  (left arm) Cuff size:   regular  Vitals Entered By: Hardin Negus, RMA (August 12, 2010 10:37 AM)  Physical Exam  General:  patient is stable but overweight. Eyes:  no xanthelasma. Neck:  no jugular distention. Lungs:  lungs are clear.  Respiratory effort  is not labored.   Heart:  cardiac exam reveals S1-S2.  No clicks or significant murmurs. Abdomen:  abdomen soft. Extremities:  no peripheral edema. Psych:  patient is oriented to person time and place.  Affect is normal.   Impression & Recommendations:  Problem # 1:  CAD (ICD-414.00)  Her updated medication list for this problem includes:     Metoprolol Tartrate 25 Mg Tabs (Metoprolol tartrate) .[name] Kitchen... Take one tablet by mouth twice a day    Aspirin 81 Mg Tbec (Aspirin) .[name] Kitchen... Take one tablet by mouth daily    Lisinopril-hydrochlorothiazide 20-12.5 Mg Tabs (Lisinopril-hydrochlorothiazide) .[name] Kitchen... 1 once daily    Imdur 60 Mg Xr24h-tab (Isosorbide mononitrate) .[name] Kitchen... 1 once daily Coronary disease is stable.  No further workup. I will see her back in one year for followup.  Problem # 2:  * GI SYMPTOMS The GI findings from her recent hospitalization are listed in the past medical history.  She is feeling better.  No further workup.  Patient Instructions: 1)  Your physician wants you to follow-up in:  1 year.  You will receive a reminder letter in the mail two months in advance. If you don't receive a letter, please call our office to schedule the follow-up appointment.

## 2010-10-15 NOTE — Miscellaneous (Signed)
  Clinical Lists Changes  Observations: Added new observation of PAST MED HX: Dyslipidemia LV FUNCTION....normal  /  No echo data as of Jan 15, 2010 /  normal.... catheterization... November, 2011 CAD.[name] KitchenMarland KitchenMyoview March, 2008... no scar or ischemia... normal EF /  nuclear... September, 2010... no scar or ischemia.... ejection fraction 64% /  catheterization November 4,2011..patent LIMA to the LAD .[name] Kitchen small caliber distal vessel with diffuse plaque, patent SVG to first and second OM, patent SVG to PDA and PLA, occluded SVG to diagonal, medical therapy recommended CABG  September, 2007.[name] KitchenLIMA to the LAD, SVG to diagonal, sequential SVG OM one and OM 2, sequential SVG distal RCA and PDA diabetes Overweight Tobacco and alcohol abuse in the past.... resolved stomach stapling 1985 followed by weight loss and then return of weight Status post hysterectomy Breast cancer status post bilateral mastectomy Osteoporosis Obstructive sleep apnea.... CPAP being arranged as of May, 2011 Nausea, vomiting,... during hospitalization November, 2011... endoscopy.. 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 catheterization... November, 2011 CAD.[name] KitchenMarland KitchenMyoview March, 2008... no scar or ischemia... normal EF /  nuclear... September, 2010... no scar or ischemia.... ejection fraction 64% /  catheterization November 4,2011..patent LIMA to the LAD .[name] Kitchen small caliber distal vessel with diffuse plaque, patent SVG to first and second OM, patent SVG to PDA and PLA, occluded SVG to diagonal, medical therapy recommended CABG  September, 2007.[name] KitchenLIMA to the  LAD, SVG to diagonal, sequential SVG OM one and OM 2, sequential SVG distal RCA and PDA diabetes Overweight Tobacco and alcohol abuse in the past.... resolved stomach stapling 1985 followed by weight loss and then return of weight Status post hysterectomy Breast cancer status post bilateral mastectomy Osteoporosis Obstructive sleep apnea.... CPAP being arranged as of May, 2011 Nausea, vomiting,... during hospitalization November, 2011... endoscopy.. stricture at GE junction and postoperative changes questionable for stenosis of gastrojejunostomy......, upper GI disruptive primary peristaltic wave,GE reflux, delayed emptying of the proximal gastric pouch

## 2010-10-15 NOTE — Assessment & Plan Note (Signed)
Summary: f1y  Medications Added VITAMIN D (ERGOCALCIFEROL) 50000 UNIT CAPS (ERGOCALCIFEROL) weekly      Allergies Added: NKDAI  Visit Type:  Follow-up Primary Provider:  Ellison Hughs  CC:  CAD.  History of Present Illness: The patient is seen for followup of coronary artery disease.  Patient underwent CABG in 2007.  Myoview revealed no scar or ischemia in March 2008. there was normal LV function at that time. I saw the patient in the office May, 2010.  She was stable at that time.  She was admitted to the hospital overnight in September, 2010.  There is no M. I.  She then had a followup nuclear scan June 11, 2009.  Ejection fraction was 64% and there was no ischemia or scar.  Patient has not been having chest pain or shortness of breath.  She has not had syncope or presyncope.  She had a sleep study and CPAP is being arranged but she doesn't have it yet.  She was told that "her heart stopped on 2 occasions."  I do not have these records yet.  Current Medications (verified): 1)  Lipitor 80 Mg Tabs (Atorvastatin Calcium) .... Take One Tablet By Mouth Daily. 2)  Metoprolol Tartrate 25 Mg Tabs (Metoprolol Tartrate) .... Take One Tablet By Mouth Twice A Day 3)  Lasix 40 Mg Tabs (Furosemide) .[name] Kitchen.. 1 Once Daily 4)  Aspirin 81 Mg Tbec (Aspirin) .... Take One Tablet By Mouth Daily 5)  Lisinopril-Hydrochlorothiazide 20-12.5 Mg Tabs (Lisinopril-Hydrochlorothiazide) .[name] Kitchen.. 1 Once Daily 6)  Vitamin D (Ergocalciferol) 50000 Unit Caps (Ergocalciferol) .... Weekly 7)  Metformin Hcl 500 Mg Tabs (Metformin Hcl) .... Take 1 Once Daily 8)  Fosamax 70 Mg Tabs (Alendronate Sodium) .[name] Kitchen.. 1 Q Week 9)  Imdur 60 Mg Xr24h-Tab (Isosorbide Mononitrate) .[name] Kitchen.. 1 Once Daily 10)  Hydrocodone .... As Needed 11)  Albuterol Sulfate .... As Needed  Allergies (verified): No Known Drug Allergies  Past History:  Past Medical History: Dyslipidemia LV FUNCTION....normal  /  No echo data as of Jan 15, 2010 CAD.[name] KitchenMarland KitchenMyoview March, 2008... no scar or ischemia... normal EF /  nuclear... September, 2010... no scar or ischemia.... ejection fraction 64% CABG  September, 2007.[name] KitchenLIMA to the LAD, SVG to diagonal, sequential SVG OM one and OM 2, sequential SVG distal RCA and PDA diabetes Overweight Tobacco and alcohol abuse in the past.... resolved stomach stapling 1985 followed by weight loss and then return of weight Status post hysterectomy Breast cancer status post bilateral mastectomy Osteoporosis Obstructive sleep apnea.... CPAP being arranged as of May, 2011  Review of Systems       Patient denies fever, chills, headaches,sweats, rash, headache.  She denies change in vision or change in hearing.  There is no chest pain or shortness of breath or cough.  She has no GI or GU symptoms.  All of the systems are reviewed and are negative.  Vital Signs:  Patient profile:   60 year old female Height:      59 inches Weight:      215 pounds BMI:     43.58 Pulse rate:   75 / minute BP sitting:   110 / 58  (left arm) Cuff size:   large  Vitals Entered By: Hardin Negus, RMA (Jan 16, 2010 10:58 AM)  Physical Exam  General:  patient is stable today. Head:  head is atraumatic. Eyes:  no xanthelasma. Neck:  no jugular venous distention. Chest Wall:  no chest wall tenderness. Lungs:  lungs are clear.  Respiratory effort is nonlabored. Heart:  cardiac exam reveals S1-S2.  No clicks or significant murmurs. Abdomen:  abdomen is soft. Msk:  no musculoskeletal deformities. Extremities:  no peripheral edema. Skin:  no skin rashes. Psych:  patient is oriented to person time and place.  Affect is normal.   Impression & Recommendations:  Problem # 1:  SLEEP APNEA, OBSTRUCTIVE (ICD-327.23) The patient gives a history now of having had a sleep study.  She is to receive CPAP.  Problem # 2:  CAD (ICD-414.00)  The following medications were removed from the medication list:    Nitroglycerin 0.4 Mg  Subl (Nitroglycerin) .[name] Kitchen... Take 1 tab sl as needed chest pain Her updated medication list for this problem includes:    Metoprolol Tartrate 25 Mg Tabs (Metoprolol tartrate) .[name] Kitchen... Take one tablet by mouth twice a day    Aspirin 81 Mg Tbec (Aspirin) .[name] Kitchen... Take one tablet by mouth daily    Lisinopril-hydrochlorothiazide 20-12.5 Mg Tabs (Lisinopril-hydrochlorothiazide) .[name] Kitchen... 1 once daily    Imdur 60 Mg Xr24h-tab (Isosorbide mononitrate) .[name] Kitchen... 1 once daily Coronary disease is stable.  I have reviewed completely the records from the hospital from September, 2010.  I have reviewed the nuclear scan report from September, 2010.  She is not having any significant symptoms at this time.  Medical therapy to be continued.  Problem # 3:  * BRADYCARDIA WITH SLEEP STUDY The patient reports that her "heart stopped" on 2 occasions during her sleep study.  I suspected she had bradycardia related to her sleep apnea.  We will obtain the full report.  She does not need further workup at this time until we have more information.  Problem # 4:  DYSLIPIDEMIA (ICD-272.4)  Her updated medication list for this problem includes:    Lipitor 80 Mg Tabs (Atorvastatin calcium) .[name] Kitchen... Take one tablet by mouth daily. Patient is being treated for her hyperlipidemia.  No change in therapy.  Patient Instructions: 1)  Follow up in 6 months

## 2010-10-16 NOTE — Consult Note (Signed)
Summary: [facility]  [facility]   Imported By: Marylou Mccoy 09/04/2010 14:44:57  _____________________________________________________________________  External Attachment:    Type:   Image     Comment:   External Document

## 2010-10-24 ENCOUNTER — Other Ambulatory Visit (HOSPITAL_COMMUNITY): Payer: Self-pay | Admitting: Oncology

## 2010-10-24 ENCOUNTER — Encounter (HOSPITAL_BASED_OUTPATIENT_CLINIC_OR_DEPARTMENT_OTHER): Payer: Medicaid Other | Admitting: Oncology

## 2010-10-24 DIAGNOSIS — C50919 Malignant neoplasm of unspecified site of unspecified female breast: Secondary | ICD-10-CM

## 2010-10-24 DIAGNOSIS — Z800 Family history of malignant neoplasm of digestive organs: Secondary | ICD-10-CM

## 2010-10-24 DIAGNOSIS — M899 Disorder of bone, unspecified: Secondary | ICD-10-CM

## 2010-10-24 LAB — COMPREHENSIVE METABOLIC PANEL
ALT: 18 U/L (ref 0–35)
CO2: 23 mEq/L (ref 19–32)
Calcium: 9.5 mg/dL (ref 8.4–10.5)
Chloride: 105 mEq/L (ref 96–112)
Creatinine, Ser: 0.82 mg/dL (ref 0.40–1.20)
Glucose, Bld: 139 mg/dL — ABNORMAL HIGH (ref 70–99)
Sodium: 140 mEq/L (ref 135–145)
Total Protein: 6.8 g/dL (ref 6.0–8.3)

## 2010-10-24 LAB — CBC WITH DIFFERENTIAL/PLATELET
BASO%: 0.5 % (ref 0.0–2.0)
Eosinophils Absolute: 0.1 10*3/uL (ref 0.0–0.5)
HCT: 40.9 % (ref 34.8–46.6)
MCHC: 32.5 g/dL (ref 31.5–36.0)
MONO#: 0.5 10*3/uL (ref 0.1–0.9)
NEUT#: 2.1 10*3/uL (ref 1.5–6.5)
NEUT%: 36.9 % — ABNORMAL LOW (ref 38.4–76.8)
WBC: 5.6 10*3/uL (ref 3.9–10.3)
lymph#: 3 10*3/uL (ref 0.9–3.3)

## 2010-10-24 LAB — LACTATE DEHYDROGENASE: LDH: 189 U/L (ref 94–250)

## 2010-11-25 LAB — GLUCOSE, CAPILLARY
Glucose-Capillary: 106 mg/dL — ABNORMAL HIGH (ref 70–99)
Glucose-Capillary: 107 mg/dL — ABNORMAL HIGH (ref 70–99)
Glucose-Capillary: 127 mg/dL — ABNORMAL HIGH (ref 70–99)
Glucose-Capillary: 128 mg/dL — ABNORMAL HIGH (ref 70–99)
Glucose-Capillary: 128 mg/dL — ABNORMAL HIGH (ref 70–99)
Glucose-Capillary: 129 mg/dL — ABNORMAL HIGH (ref 70–99)
Glucose-Capillary: 134 mg/dL — ABNORMAL HIGH (ref 70–99)
Glucose-Capillary: 143 mg/dL — ABNORMAL HIGH (ref 70–99)
Glucose-Capillary: 150 mg/dL — ABNORMAL HIGH (ref 70–99)
Glucose-Capillary: 156 mg/dL — ABNORMAL HIGH (ref 70–99)

## 2010-11-25 LAB — DIFFERENTIAL
Basophils Absolute: 0 10*3/uL (ref 0.0–0.1)
Basophils Absolute: 0 10*3/uL (ref 0.0–0.1)
Eosinophils Relative: 1 % (ref 0–5)
Eosinophils Relative: 1 % (ref 0–5)
Lymphocytes Relative: 30 % (ref 12–46)
Lymphocytes Relative: 49 % — ABNORMAL HIGH (ref 12–46)
Lymphs Abs: 2.3 10*3/uL (ref 0.7–4.0)
Monocytes Absolute: 0.4 10*3/uL (ref 0.1–1.0)
Neutro Abs: 4.5 10*3/uL (ref 1.7–7.7)
Neutrophils Relative %: 59 % (ref 43–77)

## 2010-11-25 LAB — BASIC METABOLIC PANEL
BUN: 8 mg/dL (ref 6–23)
Calcium: 8.3 mg/dL — ABNORMAL LOW (ref 8.4–10.5)
Calcium: 9.3 mg/dL (ref 8.4–10.5)
Chloride: 100 mEq/L (ref 96–112)
Creatinine, Ser: 0.86 mg/dL (ref 0.4–1.2)
Creatinine, Ser: 0.94 mg/dL (ref 0.4–1.2)
GFR calc Af Amer: >60 mL/min (ref >60)
GFR calc Af Amer: >60 mL/min (ref >60)
GFR calc non Af Amer: >60 mL/min (ref >60)
GFR calc non Af Amer: >60 mL/min (ref >60)
Sodium: 140 mEq/L (ref 135–145)

## 2010-11-25 LAB — CBC
HCT: 36.1 % (ref 36.0–46.0)
Hemoglobin: 10.8 g/dL — ABNORMAL LOW (ref 12.0–15.0)
MCH: 28.3 pg (ref 26.0–34.0)
MCH: 28.5 pg (ref 26.0–34.0)
MCHC: 31.2 g/dL (ref 30.0–36.0)
MCHC: 31.6 g/dL (ref 30.0–36.0)
MCV: 86.7 fL (ref 78.0–100.0)
MCV: 88.7 fL (ref 78.0–100.0)
Platelets: 183 10*3/uL (ref 150–400)
Platelets: 196 10*3/uL (ref 150–400)
Platelets: 201 10*3/uL (ref 150–400)
Platelets: 212 10*3/uL (ref 150–400)
Platelets: 219 10*3/uL (ref 150–400)
RBC: 3.94 MIL/uL (ref 3.87–5.11)
RBC: 4.05 MIL/uL (ref 3.87–5.11)
RBC: 4.07 MIL/uL (ref 3.87–5.11)
RBC: 4.54 MIL/uL (ref 3.87–5.11)
RBC: 4.7 MIL/uL (ref 3.87–5.11)
RDW: 15.1 % (ref 11.5–15.5)
RDW: 15.2 % (ref 11.5–15.5)
RDW: 15.5 % (ref 11.5–15.5)
WBC: 5.9 10*3/uL (ref 4.0–10.5)
WBC: 6.8 10*3/uL (ref 4.0–10.5)
WBC: 7.6 10*3/uL (ref 4.0–10.5)

## 2010-11-25 LAB — CARDIAC PANEL(CRET KIN+CKTOT+MB+TROPI)
CK, MB: 2.8 ng/mL (ref 0.3–4.0)
CK, MB: 4.4 ng/mL — ABNORMAL HIGH (ref 0.3–4.0)
CK, MB: 5 ng/mL — ABNORMAL HIGH (ref 0.3–4.0)
Relative Index: 1.2 (ref 0.0–2.5)
Relative Index: 1.5 (ref 0.0–2.5)
Relative Index: 1.5 (ref 0.0–2.5)
Total CK: 294 U/L — ABNORMAL HIGH (ref 7–177)
Total CK: 329 U/L — ABNORMAL HIGH (ref 7–177)
Troponin I: <0.01 ng/mL (ref 0.00–0.06)

## 2010-11-25 LAB — COMPREHENSIVE METABOLIC PANEL
AST: 27 U/L (ref 0–37)
Albumin: 3.8 g/dL (ref 3.5–5.2)
Chloride: 101 mEq/L (ref 96–112)
Creatinine, Ser: 0.77 mg/dL (ref 0.4–1.2)
GFR calc Af Amer: >60 mL/min (ref >60)
Potassium: 3.9 mEq/L (ref 3.5–5.1)
Total Bilirubin: 0.6 mg/dL (ref 0.3–1.2)

## 2010-11-25 LAB — D-DIMER, QUANTITATIVE: D-Dimer, Quant: <0.22 ug/mL-FEU (ref 0.00–0.48)

## 2010-11-28 LAB — CBC
Hemoglobin: 12.9 g/dL (ref 12.0–15.0)
MCH: 28.8 pg (ref 26.0–34.0)
MCHC: 32.7 g/dL (ref 30.0–36.0)
MCV: 88.1 fL (ref 78.0–100.0)
RBC: 4.49 MIL/uL (ref 3.87–5.11)

## 2010-11-28 LAB — URINE MICROSCOPIC-ADD ON

## 2010-11-28 LAB — URINALYSIS, ROUTINE W REFLEX MICROSCOPIC
Glucose, UA: NEGATIVE mg/dL
Ketones, ur: NEGATIVE mg/dL
Nitrite: POSITIVE — AB
Protein, ur: 30 mg/dL — AB
pH: 5.5 (ref 5.0–8.0)

## 2010-11-28 LAB — URINE CULTURE
Colony Count: >=100000
Culture  Setup Time: 201108180221

## 2010-12-01 LAB — POCT CARDIAC MARKERS
CKMB, poc: 3.7 ng/mL (ref 1.0–8.0)
Myoglobin, poc: 90.9 ng/mL (ref 12–200)
Troponin i, poc: <0.05 ng/mL (ref 0.00–0.09)
Troponin i, poc: <0.05 ng/mL (ref 0.00–0.09)

## 2010-12-01 LAB — CBC
Platelets: 193 10*3/uL (ref 150–400)
RDW: 15.9 % — ABNORMAL HIGH (ref 11.5–15.5)
WBC: 5.7 10*3/uL (ref 4.0–10.5)

## 2010-12-01 LAB — D-DIMER, QUANTITATIVE: D-Dimer, Quant: 0.26 ug/mL-FEU (ref 0.00–0.48)

## 2010-12-01 LAB — POCT I-STAT, CHEM 8
BUN: 15 mg/dL (ref 6–23)
Chloride: 109 mEq/L (ref 96–112)
Potassium: 3.9 mEq/L (ref 3.5–5.1)
Sodium: 141 mEq/L (ref 135–145)
TCO2: 24 mmol/L (ref 0–100)

## 2010-12-01 LAB — DIFFERENTIAL
Basophils Absolute: 0.1 10*3/uL (ref 0.0–0.1)
Lymphocytes Relative: 45 % (ref 12–46)
Lymphs Abs: 2.6 10*3/uL (ref 0.7–4.0)
Neutro Abs: 2.7 10*3/uL (ref 1.7–7.7)
Neutrophils Relative %: 47 % (ref 43–77)

## 2010-12-17 LAB — BASIC METABOLIC PANEL
BUN: 8 mg/dL (ref 6–23)
CO2: 28 mEq/L (ref 19–32)
Chloride: 106 mEq/L (ref 96–112)
Potassium: 4.1 mEq/L (ref 3.5–5.1)

## 2010-12-17 LAB — DIFFERENTIAL
Lymphs Abs: 3.2 10*3/uL (ref 0.7–4.0)
Monocytes Relative: 10 % (ref 3–12)
Neutro Abs: 2.3 10*3/uL (ref 1.7–7.7)
Neutrophils Relative %: 37 % — ABNORMAL LOW (ref 43–77)

## 2010-12-17 LAB — CBC
HCT: 37.3 % (ref 36.0–46.0)
MCV: 86.4 fL (ref 78.0–100.0)
Platelets: 204 10*3/uL (ref 150–400)
WBC: 6.1 10*3/uL (ref 4.0–10.5)

## 2010-12-19 LAB — COMPREHENSIVE METABOLIC PANEL
Alkaline Phosphatase: 98 U/L (ref 39–117)
BUN: 9 mg/dL (ref 6–23)
Chloride: 108 mEq/L (ref 96–112)
Glucose, Bld: 125 mg/dL — ABNORMAL HIGH (ref 70–99)
Potassium: 3.7 mEq/L (ref 3.5–5.1)
Total Bilirubin: 0.6 mg/dL (ref 0.3–1.2)

## 2010-12-19 LAB — BRAIN NATRIURETIC PEPTIDE: Pro B Natriuretic peptide (BNP): <30 pg/mL (ref 0.0–100.0)

## 2010-12-19 LAB — CK TOTAL AND CKMB (NOT AT ARMC)
CK, MB: 4.9 ng/mL — ABNORMAL HIGH (ref 0.3–4.0)
Total CK: 307 U/L — ABNORMAL HIGH (ref 7–177)

## 2010-12-19 LAB — GLUCOSE, CAPILLARY
Glucose-Capillary: 107 mg/dL — ABNORMAL HIGH (ref 70–99)
Glucose-Capillary: 121 mg/dL — ABNORMAL HIGH (ref 70–99)
Glucose-Capillary: 130 mg/dL — ABNORMAL HIGH (ref 70–99)

## 2010-12-19 LAB — LIPID PANEL
Cholesterol: 139 mg/dL (ref 0–200)
HDL: 41 mg/dL (ref >39)
Total CHOL/HDL Ratio: 3.4 RATIO

## 2010-12-19 LAB — DIFFERENTIAL
Basophils Relative: 1 % (ref 0–1)
Eosinophils Absolute: 0 10*3/uL (ref 0.0–0.7)
Neutro Abs: 3.3 10*3/uL (ref 1.7–7.7)
Neutrophils Relative %: 52 % (ref 43–77)

## 2010-12-19 LAB — POCT CARDIAC MARKERS: Myoglobin, poc: 74 ng/mL (ref 12–200)

## 2010-12-19 LAB — CBC
HCT: 39 % (ref 36.0–46.0)
Hemoglobin: 12.7 g/dL (ref 12.0–15.0)
WBC: 6.4 10*3/uL (ref 4.0–10.5)

## 2010-12-19 LAB — PROTIME-INR
INR: 1.1 (ref 0.00–1.49)
Prothrombin Time: 14 seconds (ref 11.6–15.2)

## 2010-12-19 LAB — CARDIAC PANEL(CRET KIN+CKTOT+MB+TROPI)
CK, MB: 9.8 ng/mL — ABNORMAL HIGH (ref 0.3–4.0)
Relative Index: 3.2 — ABNORMAL HIGH (ref 0.0–2.5)
Total CK: 304 U/L — ABNORMAL HIGH (ref 7–177)
Troponin I: 0.01 ng/mL (ref 0.00–0.06)

## 2010-12-22 LAB — URINALYSIS, ROUTINE W REFLEX MICROSCOPIC
Bilirubin Urine: NEGATIVE
Ketones, ur: NEGATIVE mg/dL
Nitrite: NEGATIVE
pH: 6.5 (ref 5.0–8.0)

## 2010-12-22 LAB — DIFFERENTIAL
Basophils Absolute: 0 10*3/uL (ref 0.0–0.1)
Eosinophils Absolute: 0 10*3/uL (ref 0.0–0.7)
Eosinophils Relative: 0 % (ref 0–5)
Eosinophils Relative: 0 % (ref 0–5)
Lymphocytes Relative: 42 % (ref 12–46)
Lymphocytes Relative: 45 % (ref 12–46)
Lymphs Abs: 3.5 10*3/uL (ref 0.7–4.0)
Monocytes Relative: 6 % (ref 3–12)
Monocytes Relative: 7 % (ref 3–12)
Neutro Abs: 4.4 10*3/uL (ref 1.7–7.7)
Neutrophils Relative %: 51 % (ref 43–77)

## 2010-12-22 LAB — COMPREHENSIVE METABOLIC PANEL
ALT: 14 U/L (ref 0–35)
AST: 21 U/L (ref 0–37)
Calcium: 9 mg/dL (ref 8.4–10.5)
GFR calc Af Amer: >60 mL/min (ref >60)
Sodium: 139 mEq/L (ref 135–145)
Total Protein: 6.9 g/dL (ref 6.0–8.3)

## 2010-12-22 LAB — CBC
HCT: 39.1 % (ref 36.0–46.0)
Hemoglobin: 13.1 g/dL (ref 12.0–15.0)
MCHC: 32.3 g/dL (ref 30.0–36.0)
MCHC: 33.4 g/dL (ref 30.0–36.0)
Platelets: 201 10*3/uL (ref 150–400)
RDW: 16.8 % — ABNORMAL HIGH (ref 11.5–15.5)
RDW: 17.2 % — ABNORMAL HIGH (ref 11.5–15.5)

## 2010-12-25 LAB — GLUCOSE, CAPILLARY: Glucose-Capillary: 145 mg/dL — ABNORMAL HIGH (ref 70–99)

## 2010-12-30 LAB — GLUCOSE, CAPILLARY: Glucose-Capillary: 150 mg/dL — ABNORMAL HIGH (ref 70–99)

## 2011-01-26 ENCOUNTER — Encounter: Payer: Self-pay | Admitting: Cardiology

## 2011-01-26 DIAGNOSIS — E119 Type 2 diabetes mellitus without complications: Secondary | ICD-10-CM | POA: Insufficient documentation

## 2011-01-26 DIAGNOSIS — C50919 Malignant neoplasm of unspecified site of unspecified female breast: Secondary | ICD-10-CM | POA: Insufficient documentation

## 2011-01-26 DIAGNOSIS — R9430 Abnormal result of cardiovascular function study, unspecified: Secondary | ICD-10-CM | POA: Insufficient documentation

## 2011-01-26 DIAGNOSIS — I10 Essential (primary) hypertension: Secondary | ICD-10-CM | POA: Insufficient documentation

## 2011-01-26 DIAGNOSIS — E785 Hyperlipidemia, unspecified: Secondary | ICD-10-CM | POA: Insufficient documentation

## 2011-01-27 NOTE — Assessment & Plan Note (Signed)
[facility]                         GASTROENTEROLOGY OFFICE NOTE   Patricia Cuevas                     MRN:          [id number]  DATE:09/16/2007                            DOB:          09-28-1950    REFERRING PHYSICIAN:  Ashok Croon   [name] was referred by a physician at the [facility].  She does not remember the physician's name and we have no  record of that visit here in E Chart or in the [facility] chart or in the  [facility] EMR System.   REASON FOR REFERRAL:  [name] was referred here to discuss colorectal  cancer screening with colonoscopy.   HPI:  [name] is a pleasant, 60 year old woman, who has never had  troubles with her bowels.  She has no constipation, diarrhea or overt GI  bleeding.  She was recently told that she should be evaluated for a  colonoscopy.  She had labs done in November of 2008 that showed she was  not anemic.   REVIEW OF SYSTEMS:  Notable for stable weight, otherwise essentially  normal, and is available on our nursing intake sheet.   PAST MEDICAL HISTORY:  Coronary artery disease with a six-vessel  coronary artery bypass grafting in 2007.  Gastric bypass in 1988.  She  had breast cancer, treated with bilateral mastectomy, no chemotherapy or  radiation.  She had a hysterectomy.  She has arthritis, elevated  cholesterol, diabetes, hypertension.   CURRENT MEDICINES:  Lipitor, lisinopril, metoprolol, aspirin, Arimidex,  Lasix, Fosamax, potassium and albuterol inhaler as directed.   ALLERGIES:  No known drug allergies.   SOCIAL HISTORY:  Divorced, two children.  Nonsmoker, nondrinker.   FAMILY HISTORY:  No colon cancer or colon polyps in family.   PHYSICAL EXAM:  Patient is 4 feet 11 inches, 225 pounds.  Blood pressure  126/80, pulse 80.  CONSTITUTIONAL:  Obese, otherwise well-appearing.  NEUROLOGIC:  Alert and oriented times three.  EYES:  Extraocular movements intact.  MOUTH:  Oropharynx moist, no lesions.  NECK:  Supple, no lymphadenopathy.  CARDIOVASCULAR/HEART:  Regular rate and rhythm.  LUNGS:  Clear to auscultation bilaterally.  ABDOMEN:  Soft, nontender, nondistended.  Normal bowel sounds.  EXTREMITIES:  No lower extremity edema.  SKIN:  No rashes or lesions on visible extremities.   ASSESSMENT AND PLAN:  Patient is a 60 year old woman at average risk for  colorectal cancer.   We will arrange for [name] to have colonoscopy performed at her  soonest convenience.  I see no reason for any further blood tests or  imaging studies prior to then.  I discussed risks, benefits, and  alternatives to colonoscopy and she understands and wishes to proceed.     Rachael Fee, MD  Electronically Signed    DPJ/MedQ  DD: 09/16/2007  DT: 09/16/2007  Job #: [id number]   cc:   [facility]

## 2011-01-27 NOTE — Assessment & Plan Note (Signed)
[facility]                            CARDIOLOGY OFFICE NOTE   [place]ERCAD[place], CO                     MRN:          [id number]  DATE:10/23/2008                            DOB:          12/22/1950    [name] is here for cardiology followup.  We know that she has  coronary disease.  She has had some chest pain.  She ran out of  nitroglycerin.  I am seeing her to help reassess her situation today.  The patient underwent CABG in September 2007.  She actually had a  followup nuclear scan in February 2008, showing no ischemia.   PAST MEDICAL HISTORY:   ALLERGIES:  No known drug allergies.   MEDICATIONS:  1. Lipitor 80.  2. Metoprolol 25 b.i.d.  3. Aspirin 81.  4. Vitamin D.  5. Metformin.  6. Lisinopril and hydrochlorothiazide.  7. Fosamax.   OTHER MEDICAL PROBLEMS:  See the list below.   REVIEW OF SYSTEMS:  She is not having any GI or GU complaints.  She has  no fevers or chills.  There are no headaches.  There is no skin rashes.  Otherwise, review of systems is negative.   PHYSICAL EXAMINATION:  VITAL SIGNS:  Blood pressure is 138/ 82 with a  pulse of 79.  GENERAL:  The patient is oriented to person, time, and place.  Affect is  normal.  HEENT:  No xanthelasma.  She has normal extraocular motion.  NECK:  There are no carotid bruits.  There is no jugular venous  distention.  LUNGS:  Clear.  Respiratory effort is not labored.  CARDIAC:  S1 with an S2.  There are no clicks or significant murmurs.  ABDOMEN:  Soft, but obese.  EXTREMITIES:  She has no peripheral edema.   EKG reveals nonspecific ST-T wave changes.  There are no change.   Problems include,  1. History of stomach stapling in 1985 with weight loss after that,      but return of weight.  2. History of good left ventricular function.  3. History of coronary artery bypass graft in September 2007.  4. Recurring chest pain.  I had seen her for this in 2008 and her  Myoview showed no ischemia.  I am not convinced it is ischemia at      this time; however, it is possible.  We will give her a trial of      Imdur to see if this helps.  5. History of status post hysterectomy.  6. Breast cancer status post bilateral mastectomy.  7. Hypercholesterolemia on medication.  8. Osteoporosis on Fosamax.   The patient is stable today.  We will give her prescriptions as needed.  Also I will start her on 60 mg of Imdur daily and I will see her for  followup.     Luis Abed, MD, [facility]  Electronically Signed    JDK/MedQ  DD: 10/23/2008  DT: 10/24/2008  Job #: [id number]   cc:   Vedia Coffer, NP

## 2011-01-27 NOTE — H&P (Signed)
[name], [name]              ACCOUNT NO.:  [id number]   MEDICAL RECORD NO.:  [id number]          PATIENT TYPE:  INP   LOCATION:  3735                         FACILITY:  [facility]   PHYSICIAN:  Patricia Canning. Ladona Ridgel, MD    DATE OF BIRTH:  06-15-1951   DATE OF ADMISSION:  05/16/2009  DATE OF DISCHARGE:                              HISTORY & PHYSICAL   PRIMARY CARDIOLOGIST:  Patricia Abed, MD, [facility]   PRIMARY CARE PHYSICIAN:  Patricia Cuevas, ANP-C   REASON FOR ADMISSION:  Chest pain.   HISTORY OF PRESENT ILLNESS:  [name] is a 60 year old African American  female with a known history of CAD, S/P CABG on May 31, 2006 (see  details in the past medical history section), NIDDM, morbid obesity,  remote tobacco abuse disorder with 120 plus pack-year history, quitting  4 years ago, history of EtOH abuse, quitting 7 years ago,  hyperlipidemia, and history of chest pain without evidence of cardiac  etiology, presenting with atypical chest pain since approximately 3 a.m.  this morning.  The patient developed a cold with productive cough  approximately 1 week ago, but otherwise was in her usual state of health  until approximately 3 a.m. this morning.  The patient was awakened with  sharp, right-sided chest pain lasting seconds, and diffuse chest pain,  also sharp and also lasting only seconds.  This was followed by a  choking sensation/shortness of breath and numbness in her upper  chest/neck.  The patient also describes chest burning in her chest  centrally that she likens to heartburn.  The patient reports funny  feeling with all these symptoms and some of what she describes she says  was also which she experienced in prior MI in 2007.  Her symptoms waxed  and waned all morning, however, she is currently asymptomatic.  When she  arrived, her heart rate and BP were within normal limits and remained  so.  EKG without acute changes and point-of-care markers negative x1.   PAST  MEDICAL HISTORY:  1. CAD.      a.     CABG x6 on May 31, 2006, with LIMA to LAD, SVG to D,       sequential SVG to OM1 and OM2, sequential SVG to distal RCA and       posterior descending artery.      b.     Last Myoview on November 16, 2006, without evidence of ischemia       or scar, normal EF.  2. Non-insulin-dependent diabetes mellitus.  3. Remote tobacco abuse, quit 4 years ago.      a.     120 plus pack-year history.  4. Hyperlipidemia.  5. Morbid obesity.  6. History of EtOH abuse, quit 7 years ago.  7. History of chest pain without objective evidence of cardiac      etiology.  8. S/P stomach stapling with significant weight loss at that time.  9. Osteoporosis (on Fosamax).  10.S/P hysterectomy.  11.Asthma.  12.History of nephrolithiasis.   SOCIAL HISTORY:  The patient lives in [place] with her husband.  She  has above-noted tobacco abuse history and EtOH abuse history.  She  denies any illicit drug use or herbal medications other than vitamin D.  She has a regular diet and rarely exercises.  Reports no changes in  chronic DOE nor any exertional angina recently.   REVIEW OF SYSTEMS:  Please see HPI.  Otherwise, the patient had minimal  hematuria last week, thought to be attributed to nephrolithiasis.  All  other systems reviewed and were negative.   CODE STATUS:  Full.   ALLERGIES:  NKDA.   MEDICATIONS:  1. Lipitor 80 mg p.o. nightly.  2. Metoprolol tartrate 25 mg p.o. b.i.d.  3. Lasix 40 mg p.o. daily.  4. Enteric-coated aspirin 81 mg p.o. daily.  5. Lisinopril 20 mg p.o. daily.  6. Hydrochlorothiazide 12.5 mg p.o. daily.  7. Vitamin D daily.  8. Metformin 500 mg p.o. daily.  9. Fosamax 70 mg p.o. every weekly.  10.Imdur XR 60 mg p.o. daily.  11.Nitroglycerin p.r.n. for chest pain.  12.Hydrocodone p.r.n.  13.Albuterol inhaler p.r.n.  14.In the ED, given 1-inch nitro paste.   PHYSICAL EXAMINATION:  VITAL SIGNS:  Initially in the ED, the patient   afebrile; BP 133/65, currently 122/65; initially, pulse 79, currently  remained in the 70s; respiration rate initially 16, now 18; she is 100%  on 3 L by nasal cannula.  GENERAL:  The patient is alert and oriented x3, in no apparent distress.  Able to speak and move fairly easily without any respiratory distress.  HEENT:  Head is normocephalic and atraumatic.  Pupils equal, round, and  reactive to light.  Extraocular muscles are intact.  Nares are patent  without discharge.  Oropharynx without erythema or exudates.  NECK:  Supple without lymphadenopathy.  No JVD.  No thyromegaly.  No  bruits.  HEART:  Rate is regular with audible S1 and S2.  No clicks, rubs,  murmurs, or gallops.  Pulses are 2+ and equal in both upper and lower  extremities bilaterally.  LUNGS:  Clear to auscultation bilaterally.  Decreased breath sounds at  the right base.  SKIN:  No rashes, lesions, or petechiae.  ABDOMEN:  Soft, nontender, and nondistended.  Normal abdominal bowel  sounds.  No rebound or guarding.  No hepatosplenomegaly.  No pulsations.  The patient is morbidly obese.  EXTREMITIES:  No clubbing, cyanosis, or edema.  MUSCULOSKELETAL:  No joint deformity or effusions.  No spinal or CVA  tenderness.  CHEST:  Nontender.  NEUROLOGIC:  Cranial nerves II through XII are grossly intact.  Strength  5/5 in all extremities and axial groups.  Normal sensation throughout  and normal cerebellar function.   RADIOLOGY:  Chest x-ray showed stable increased right hemidiaphragm with  right base atelectasis.   EKG rhythm, normal sinus, rate 76, biphasic T-waves in V1, V2, and  flattened I and aVL.  No significant Q-waves, normal axis, no evidence  of hypertrophy.  Intervals within normal limits.  No significant change  from prior tracing completed June 2009.   LABORATORIES:  WBC 6.4, normal differential, HGB 12.7, HCT 39.0, and PLT  count 200.  Sodium 143, potassium 3.7, chloride 108, CO2 of 26, BUN 9,   creatinine 0.79, and glucose 125.  Liver function tests within normal  limits.  BNP less than 30.  First set of point-of-care markers is  negative.   ASSESSMENT/PLAN:  [name] is a 60 year old African American female  with the above-noted medical history, who presents with atypical chest  pain  since approximately 3 a.m. this morning with thus far no objective  evidence of cardiac etiology.  1. Chest pain.  As it is atypical, we will admit to observation and      rule out serial cardiac enzymes.  Should all enzymes have been      negative, the patient will likely follow up with her primary      cardiologist for another Myoview.  2. Obesity.  The patient could benefit from nutrition consult as well      as renewed education on the importance of proper diet and exercise      compliance.  Question need for more structured plan for weight loss      given the patient's size.  We will discuss with attending.  3. Insulin-dependent diabetes.  We will continue metformin and      initiate insulin sliding scale sensitive.  4. Hyperlipidemia.  Continue home statin.   The patient will be admitted to observation on telemetry with likely  discharge in a.m.  If serial cardiac enzymes are negative, we will  follow up as above.      Jarrett Ables, PAC      Patricia Canning. Ladona Ridgel, MD  Electronically Signed    MS/MEDQ  D:  05/16/2009  T:  05/17/2009  Job:  [id number]

## 2011-01-30 NOTE — H&P (Signed)
Patricia Cuevas, Patricia Cuevas              ACCOUNT NO.:  [id number]   MEDICAL RECORD NO.:  [id number]          PATIENT TYPE:  OBV   LOCATION:  3701                         FACILITY:  [facility]   PHYSICIAN:  Dorian Pod, ACNP  DATE OF BIRTH:  [birth year]   DATE OF ADMISSION:  10/29/2006  DATE OF DISCHARGE:  10/30/2006                              HISTORY & PHYSICAL   PRIMARY CARDIOLOGIST:  Dr. Riley Kill; previously Dr. Lambert Keto in [place].   PRIMARY CARE:  Dr. Julieta Gutting.   Patricia Cuevas is a pleasant, 60 year old African American female with known  history of coronary artery disease, status post MI and coronary artery  bypass grafting in September of [address]19.  Previously had stents status  post MIs x2 at [facility], prior to moving to the [place]  area.  She presents to [facility] Emergency Room on the day of  admission, complaining of some chest discomfort that she describes more  GI in origin.  She states onset began last night around 10:30 p.m.  She  had eaten some fried pork chops and greens that had been flavored with  pork, and had tried to go to sleep.  Began having some indigestion,  substernal chest discomfort.  She states it felt like a tightness in her  esophageal area.  She took 1 nitroglycerin, with relief.  She originally  rated her pain a 10, and then it decreased to a 7, and then eventually  patient dozed off after about an hour.  She denied any associated  symptoms with the chest discomfort.  She states when she woke up this  morning and got up, she began having some reflux-type symptoms and  decided to come to the emergency room.  She states she has been doing  very well since she had her MI and bypass in the fall of last year.  She  has a very sedentary lifestyle.  Apparently she has a home health nurse  and aide who are with her on a daily basis, and a sister with all of her  housekeeping and chores.  The most exertional thing she does is to climb  steps to  go into her home.  This does not cause any chest discomfort,  but she does become dyspneic on exertion.  She states her CBGs run  between 110 and 120s at home.  Has never experienced any discomfort like  this before.  She states that it is nothing like her discomfort prior to  her MIs in the past.   ALLERGIES:  NO KNOWN DRUG ALLERGIES.   MEDICATIONS:  Include:  1. Lipitor 80.  2. Lisinopril 5.  3. Metoprolol 25 b.i.d.  4. Aspirin 325.  5. Arimidex 1 mg.  6. Lasix 40 daily.  7. Kay Ciel 20 daily .  8. Fosamax.  9. Albuterol inhaler p.r.n.   PAST MEDICAL HISTORY:  Includes:  1. Hypertension.  2. Hyperlipidemia.  3. Coronary artery disease status post CABG x6 in September of 2007.  4. Post MI.  Previous MIs and stents done at [facility] hospital.  5. Diabetes, which patient denies having.  6. Stomach  stapling in 1985.  Patient states prior to her stomach      staple she weighed 500+ pounds.  7. History of breast cancer status post bilateral mastectomies.   SOCIAL HISTORY:  She lives in [place] with her daughter and grandson.  She is unemployed, on disability.  She has 1 child.  She is divorced,  quit smoking in September of 2007.  She is on a regular diet.   FAMILY HISTORY:  Mother deceased.  Had a history of hypertension,  diabetes, heart disease.  Father deceased secondary to coronary artery  disease.  Siblings:  One sister with coronary artery disease.   REVIEW OF SYSTEMS:  Positive for chest pain, GERD symptoms, reflux.  All  other systems are negative, per patient.   PHYSICAL EXAMINATION:  VITAL SIGNS:  Temp 97.1.  Pulse 75.  Respirations  19.  Blood pressure 124/79.  Patient is saturating at 97% on room air.  GENERAL:  She is in no acute distress.  HEENT:  Normocephalic and atraumatic.  Pupils are equal, round and  reactive to light.  Sclerae are clear.  NECK:  Supple, without lymphadenopathy.  No bruits.  No definitive JVD.  However, difficult to evaluate,  secondary to body  habitus.  CARDIOVASCULAR:  Exam reveals an S1 and S2, regular rate and rhythm.  Pulses 2+ and equal, without bruits.  LUNGS:  She has distant breath sounds bilaterally, more pronounced in  the right base.  Also poor inspiratory effort.  SKIN:  Warm and dry.  ABDOMEN:  Soft and nontender.  Positive bowel sounds.  Very protuberant.  EXTREMITIES:  Lower extremities are without clubbing, cyanosis or edema.  NEUROLOGICALLY:  Patient is alert and oriented x3.  Cranial nerves II-  XII grossly intact.   Chest x-ray showing chronically elevated right diaphragm, with right  lower lobe atelectasis.  Otherwise,  no acute findings.  EKG had a rate  of 64, sinus rhythm, flat T waves, with improvement from previous EKG in  October of 2007.   LABORATORY WORK:  Hemoglobin and hematocrit 14.6 and 43, sodium 139,  potassium 4.0, BUN 7, creatinine 0.9, with a glucose of 85.  Cardiac  markers:  Troponin less than 0.05, CK-MB 1.2.   Dr. Alberta Bing brought in to examine and assess patient.  Chest  pain not really ischemic.  Admit patient for 23-hour observation, cycle  enzymes.  If stable, plan on discharging in the a.m., and follow up  outpatient with a stress Myoview with Dr. Riley Kill.      Dorian Pod, ACNP     MB/MEDQ  D:  10/29/2006  T:  10/30/2006  Job:  [id number]

## 2011-01-30 NOTE — Discharge Summary (Signed)
[name], [name]              ACCOUNT NO.:  [id number]   MEDICAL RECORD NO.:  [id number]          PATIENT TYPE:  OBV   LOCATION:  3701                         FACILITY:  [facility]   PHYSICIAN:  Gerrit Friends. Dietrich Pates, MD, FACCDATE OF BIRTH:  Nov 28, 1950   DATE OF ADMISSION:  10/29/2006  DATE OF DISCHARGE:  10/30/2006                               DISCHARGE SUMMARY   PRIMARY CARDIOLOGIST:  Dr. Myrtis Ser.   PRIMARY CARE:  Vedia Coffer.   DISCHARGE DIAGNOSIS:  Chest pain.   Negative cardiac enzymes this admission.  EKG showing shallow T-wave  inversions, interior septal with no change.  The patient is being  discharged home with instructions to complete an outpatient stress  Myoview and follow up with Dr. Tenny Craw.   PAST MEDICAL HISTORY:  Includes hypertension, hyperlipidemia, coronary  artery disease, status post CABG times 6 in September of 2007 following  an non-ST elevated MI, with previous stents and MI's at [facility].  Diabetes, which the patient denies having.  Stomach stapling  in 1985, the patient states at one time she weighed greater than 500  pounds.  History of breast cancer, status post bilateral mastectomy.   HOSPITAL COURSE:  [name] is a 60 year old African-American female,  disabled secondary to past medical history, who was at home on the day  of admission, had just eaten fried pork chops and greens cooked in pork  seasoning, who began having some indigestion and chest discomfort.  She  states she took a nitroglycerin and that seemed to help decrease her  pain from a 10 to a 7.  However, the pain continued for approximately  one hour and the patient finally dozed off.  On day of admission, she  states that when she got up, she once again began having reflux-type  symptoms.  She called our office and was instructed to come to the  emergency room for further evaluation.  In the ER, her EKG showed no  change from previous EKG's.  Point of care markers were  negative.  The  patient was admitted for 23-hours observation.  Ruled out for MI.  Dr.  Dietrich Pates in to see the patient on the day of discharge.  The patient is  being discharged home to follow up with Dr. Myrtis Ser on November 16, 2006 at  8:45 a.m.  Prior to this, she has a stress test scheduled, adenosine  Myoview November 11, 2006 at 8:15 a.m. at our office.  She was  instructed to continue a heart-healthy diabetic diet.  Activity as  tolerated.   MEDICATIONS AT DISCHARGE:  As prior to this admission including:  1. Lipitor 80.  2. Lisinopril 5.  3. Metoprolol 25 b.i.d.  4. Aspirin 325.  5. Arimidex 1 mg.  6. Lasix 40 mg.  7. Fosamax 70.  8. KCl 20 meq.  9. Albuterol inhaler as previously prescribed.  10.Oxycodone as previously prescribed.   Duration of discharge encounter is 30 minutes.      Dorian Pod, ACNP      Gerrit Friends. Dietrich Pates, MD, [facility]  Electronically Signed    MB/MEDQ  D:  10/30/2006  T:  10/31/2006  Job:  [id number]   cc:   Aurelio Brash, N.P.

## 2011-01-30 NOTE — Op Note (Signed)
NAME:  Patricia Cuevas, Patricia Cuevas              ACCOUNT NO.:  [id number]   MEDICAL RECORD NO.:  [id number]          PATIENT TYPE:  INP   LOCATION:  2314                         FACILITY:  [facility]   PHYSICIAN:  Evelene Croon, M.D.     DATE OF BIRTH:  09/20/1950   DATE OF PROCEDURE:  05/31/2006  DATE OF DISCHARGE:                                 OPERATIVE REPORT   PREOPERATIVE AND POSTOPERATIVE DIAGNOSIS:  Severe three-vessel coronary  artery disease with unstable angina, status post non-ST segment elevation  MI.   OPERATIVE PROCEDURE:  Median sternotomy, extracorporeal circulation,  coronary bypass graft surgery x 6 using a left internal mammary artery graft  to left anterior descending coronary artery, with a saphenous vein graft to  diagonal branch of the LAD, a sequential saphenous vein graft to the first  and second obtuse marginal branches of left circumflex coronary artery, and  a sequential saphenous vein graft to the distal right coronary artery and  the posterior descending coronary artery.  Endoscopic vein harvesting from  the right and left leg.   ATTENDING SURGEON:  Evelene Croon, M.D.   ASSISTANT:  Jerold Coombe, P.A.   ANESTHESIA:  General endotracheal.   CLINICAL HISTORY:  This patient is a 60 year old woman who was admitted with  unstable angina and ruled in for a non-ST segment elevation MI.  She had a  history of coronary disease status post stenting of her left circumflex  coronary artery in the past.  Repeat cardiac catheterization on 05/26/2006  showed severe three-vessel disease.  The LAD had a 50% stenosis after the  diagonal branch and then a 90% segmental stenosis.  The diagonal itself was  moderate size vessel that had 40 and 50% proximal stenoses.  The left  circumflex had 80% stenosis before the stent.  The stent extended up to the  bifurcation of the first and second marginal branches.  The stent was widely  patent.  The right coronary was diffusely diseased  with 40% proximal and a  long 80% midvessel stenosis.  The posterior descending branch had a 90%  ostial stenosis.  There were a couple small posterolateral branches.  The  left ventricular function was normal.  There is no significant gradient  across the aortic valve and no mitral regurgitation.  After review of the  angiogram and examination the patient was felt that coronary bypass graft  surgery was the best treatment to further ischemia and infarction.  I  discussed the operative procedure with the patient and her family including  alternatives, benefits, and risks including bleeding, blood transfusion,  infection, stroke, myocardial infarction, graft failure, and death.  They  understood and agreed to proceed.   OPERATIVE PROCEDURE:  The patient was taken off the room, placed on table  supine position.  After induction of general endotracheal anesthesia a Foley  catheter was placed in bladder using sterile technique.  Then the chest,  abdomen and both lower extremities were prepped and draped usual sterile  manner.  The chest was entered through a median sternotomy incision.  The  pericardium opened in the midline.  Examination of heart showed good  ventricular contractility.  The ascending aorta had no palpable plaques in  it.   Then the left internal mammary artery was flushed from the chest wall as  pedicle graft.  This is a medium caliber vessel with excellent blood flow  through it.  At the same time a segment of greater saphenous vein was  harvested from the right thigh using endoscopic vein harvest technique.  This vein was of medium size and good quality.  We also harvested a segment  of vein going down the right leg but this vein bifurcated just below the  knee and two small branches that were not of suitable size.  Therefore we  had to harvest another segment of saphenous vein from the left thigh using  endoscopic vein harvest technique and this vein was also of medium  size and  good quality.   Then the patient was heparinized and when an adequate activated clotting  time was achieved, the distal ascending aorta was cannulated using a 20-  [place] aortic cannula for arterial inflow.  Venous outflow was achieved  using a two-stage venous cannula for the right atrial appendage.  Antegrade  cardioplegia and vent cannula was inserted in the aortic root.   The patient placed on cardiopulmonary bypass and distal coronaries  identified.  The LAD was intramyocardial but was located in its midportion  were was a large graftable vessel.  Diagonal branch was diffusely diseased  but graftable distally.  The first and second marginal branches were both  large graftable vessels.  The stent appeared to extend right up into the  bifurcation of these vessels.  Therefore both were grafted.  The right  coronary artery was diffusely diseased.  Just beyond the takeoff of  posterior descending branch,  there was an area that was mildly diseased but  soft enough to graft.  The posterior descending itself was a medium size  graftable vessel distally.  The posterolateral branches themselves were not  graftable.   Then the aorta was crossclamped and 500 mL of cold blood antegrade  cardioplegia was administered in the aortic root with quick arrest the  heart.  Systemic hypothermia to 20 degrees centigrade and topical  hypothermic with iced saline was used.  Temperature probe placed in the  septum insulating pad in the pericardium.   The first distal anastomosis was performed to the first marginal branch.  The internal diameter was about 1.75 mm.  Conduit used was a segment of  greater saphenous vein, the anastomosis performed in a sequential side-to-  side manner using continuous 7-0 Prolene suture.  Flow was noted through the  graft and was excellent.   The second distal anastomosis was performed to the second marginal branch. The internal diameter of this vessel was also  about 1.75 mm.  Conduit used  was the same segment of greater saphenous vein.  The anastomosis performed  in a sequential end-to-side manner using continuous 7-0 Prolene suture.  Flow was noted through the graft and was excellent.  Then a dose of  cardioplegia given down vein graft and in the aortic root.   The third distal anastomosis was performed to the distal right coronary  artery just beyond takeoff of posterior descending branch.  The internal  diameter was about 1.75 mm.  Conduit used was a second segment of greater  saphenous vein.  The anastomosis formed a sequential side-to-side manner  using continuous 7-0 Prolene suture.  Flow was noted through the  graft and  was excellent.   The fourth distal anastomosis was performed to the posterior descending  branch.  The internal diameter was about 1.6 mm.  Conduit used was the same  segment of greater saphenous vein.  The anastomosis performed in a  sequential end-to-side manner using continuous 7-0 Prolene suture.  Flow was  measured through the graft was excellent.  Then another dose of cardioplegia  given down vein grafts and aortic root.   The fifth distal anastomosis was performed to diagonal branch.  The internal  diameter was 1.6 mm.  Conduit used was a third segment of greater saphenous  vein and anastomosis performed in end-to-side manner continuous 7-0 Prolene  suture.  Flow was measured through the graft and was excellent.   The sixth distal anastomosis was performed to the midportion of left  anterior descending coronary.  The internal diameter was about 2 mm.  Conduit used was a left internal mammary graft and this was brought through  an opening in the left pericardium anterior the phrenic nerve.  It was  anastomosed the LAD in end-to-side manner continuous 8-0 Prolene suture.  The pedicle was sutured the epicardium 6-0 Prolene procedures.  The patient  rewarmed 37 degrees centigrade.  The three proximal vein graft  anastomosis  was then performed the aortic root in end-to-side manner continuous 6-0  Prolene suture.  The clamp removed from mammary pedicle.  There is rapid  warming of the ventricular septum and return of spontaneous ventricular  fibrillation.  Crossclamp removed with time 91 minutes.  The patient  defibrillated into sinus rhythm.   The proximal and distal anastomoses appeared hemostatic, line of the grafts  satisfactory.  Graft markers placed around the proximal anastomoses.  Two  temporary right ventricular and right atrial pacing wires placed and brought  out through the skin.   When the patient rewarmed 37 degrees centigrade.  She was weaned from  cardiopulmonary bypass on no inotropic agents.  Total bypass time was 111  minutes.  Cardiac function appeared excellent with cardiac output of 4.5  liters minute.  Protamine was given and the venous and aortic cannulas  removed without difficulty.  Hemostasis was achieved.  The patient was given 10 units of platelets due to platelet count 100,000.  Then three chest tubes  were placed, tube in the post pericardium, one in left pleural space, one in  the anterior mediastinum.  The pericardium was loosely reapproximated over  the heart.  Sternum was closed #6 stainless steel wires.  Fascia was closed  with continuous #1 Vicryl suture.  Subcu tissue was closed with continuous 2-  0 Vicryl and the skin with 3-0 Vicryl subcuticular closure.  The lower  extremity vein harvest site was closed in layers in similar manner.  The  sponge, needles and instrument counts correct according to scrub nurse.  Dry  sterile dressing applied over the incisions around the chest tubes which  were hooked to Pleur-Evac suction.  The patient remained hemodynamically  stable, transferred to the SICU in guarded but stable condition.      Evelene Croon, M.D.  Electronically Signed     BB/MEDQ  D:  05/31/2006  T:  06/01/2006  Job:  [id number]   cc:   Arturo Morton.  Riley Kill, MD, [facility]

## 2011-01-30 NOTE — H&P (Signed)
NAMEQUETZALLI, CLOS              ACCOUNT NO.:  [id number]   MEDICAL RECORD NO.:  [id number]          PATIENT TYPE:  EMS   LOCATION:  ED                           [facility]ACILITY[facility]  [facility]   PHYSICIAN:  Lonia Blood, M.D.DATE OF BIRTH:  [birth year]   DATE OF ADMISSION:  05/24/2006  DATE OF DISCHARGE:                                HISTORY & PHYSICAL   PRIMARY CARE PHYSICIAN:  Unassigned.   CARDIOLOGIST:  Dr. Lambert Keto in [place], [place].   CHIEF COMPLAINT:  Chest pain.   HISTORY OF PRESENT ILLNESS:  Ms. 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  She is unable to tell me when  her last investigational cardiac study was accomplished.  She was in her  usual state of health until she ate a hotdog this evening.  She suffered the  immediate acute onset of left-sided chest pressure.  She is somewhat  difficult to pin down in describing the exact nature of the pain.  She  repeatedly refers to it, however, as a grabbing pressure sensation, but then  also mentions the word stabbing on numerous occasions.  She denies  diaphoresis or shortness of breath during her spell.  The episode was  worsened with physical exertion.  It improved with rest.  It lasted for  approximately 5-10 minutes initially and then resolved.  Over the next hour,  it recurred on a frequent basis.  Her daughter, with whom she lives, became  concerned and prompted her to present to the emergency room.  She has had  pain off and on during her ER stay.  She is not able to tell me if in fact  she was suffering pain during the time of her EKG.  She otherwise has not  had pain recently when exerting herself.  She has not noted significant  intolerance to exertion, though she admits that she is not a very active  person at  present.  She says she has been taking all of her medications.  There has been no fever, chills, nausea, vomiting, abdominal pain or  diarrhea.  She is being treated for a urinary tract infection using Cipro,  which was begun approximately 5 days ago.   REVIEW OF SYSTEMS:  A comprehensive review of systems is carried out and is  unremarkable with exception of the elements noted in the history of present  illness above.   PAST MEDICAL HISTORY:  1. Tobacco abuse in the amount of 1/2 pack per day for multiple years.  2. History of stomach stapling in 1985.  3. Hypertension.  4. Coronary artery disease, status post MI x2 per patient report with PTCA      and multiple stents placed approximately 3-5 years ago per patient.  5. Status post hysterectomy.  6. History of breast cancer, status post bilateral mastectomies.  7. Hypercholesterolemia.   OUTPATIENT MEDICATIONS:  1. Metoprolol ER 100 mg daily.  2. Arimidex 1 mg q.a.m.  3. Ciprofloxacin b.i.d. for 5 days now.  4. Fosamax daily.  5. Tums daily.  6. Aspirin 81 mg daily.  7. Lipitor, dose unknown, daily.   ALLERGIES:  No known drug allergies.   FAMILY HISTORY:  The patient's mother is deceased due to infection, but  also had diabetes and hypertension.  The patient's father is deceased with a  history of coronary artery disease, diabetes and hypertension, though the  age at which these began is not clear.  The patient has 1 younger sister  with coronary artery disease.   SOCIAL HISTORY:  The patient now lives in [place] and is interested in  changing all of her care to the [place] area.  She is unemployed.  She is  divorced.  She has 1 child who has diabetes.   DATA REVIEW:  Hemoglobin, white count, platelet count and MCV are normal.  Sodium, potassium, chloride, bicarb, BUN and creatinine are normal.  Calcium  is normal.  Serum glucose is elevated at 156.  LFTs are all normal.  Albumin  is 3.5.  Point-of-care markers are  negative x3 total.  D-dimer is normal.   Twelve-lead EKG reveals normal sinus rhythm and there are no acute ST or T  wave changes.   PHYSICAL EXAMINATION:  VITAL SIGNS:  Temperature 97.1, blood pressure  148/84, heart rate 61, respiratory rate 22, O2 SAT is 96% on room air.  GENERAL:  A well-developed, well-nourished female in no acute respiratory  distress with stable respirations.  HEENT:  Normocephalic, atraumatic.  Pupils equal, round and reactive to  light and accommodation.  Extraocular muscles intact bilaterally.  OC/OP  clear.  NECK:  Very short and obese, but no apparent JVD.  LUNGS:  Clear to auscultation bilaterally with no wheezes or rhonchi.  CARDIOVASCULAR:  Regular rate and rhythm without murmur, gallop or rub,  normal S1 and S2.  ABDOMEN:  Obese, soft.  Bowel sounds present.  No hepatosplenomegaly.  No  rebound.  No ascites.  EXTREMITIES:  No significant cyanosis, clubbing or edema in bilateral lower  extremities.  VASCULAR:  Dorsalis pedis 2+ bilaterally.  NEUROLOGIC:  Alert and oriented x4.  Cranial nerves II-XI intact laterally.  Strength 5/5 in bilateral upper and lower extremities.  Intact sensation to  tough throughout.   IMPRESSION AND PLAN:  1. Chest pain:  While there are many components of [name]'s chest pain      that are atypical, she also does have some characteristics that are      concerning for angina.  Her EKG is in fact completely normal; however,      I am not able to confirm that it was during an actual episode of pain.      Point-of-care cardiac markers are negative x3, but it is early in the      course for this patient and this does not rule out the possibility of      coronary disease or even a mild ischemia in the hyperacute period.  I      am comforted by a normal D-dimer, however.  Given the patient's      multiple risk factors and known personal history of coronary disease, I     do feel that a full-fledged 24-hour rule-out is  indicated.  We will      also consult the cardiologist at the light of day, so that the patient      can become connected with a  local doctor.  She will be treated with all      the typical medications.  2. Hypertension:  It is not clear to me why this patient is not on an ACE      inhibitor.  Her renal function is normal.  We will initiate therapy      during this hospital stay and follow her creatinine as well as her      potassium.  3. Tobacco abuse:  I have counseled the patient as to the absolute need      for her to discontinue smoking immediately.  I will follow this up with      a tobacco cessation consultation during this hospital stay to further      encourage this patient.  I have advised her of the multiple deleterious      effects of ongoing tobacco abuse and its direct correlation to her      coronary artery disease.  4. Hypercholesterolemia:  The patient reports that she is on Lipitor, but      does not have a bottle with her and cannot tell me the dose that she      takes.  We will check a fasting lipid level to confirm that she      requires this medication.  5. Hyperglycemia:  The patient has no personal history of diabetes, but      has multiple family members with diabetes.  I suspect that she likely      has diabetes that has simply not been diagnosed.  I will check a      hemoglobin A1c and will check capillary blood glucoses on a twice-daily      basis for 48 hours before proceeding further.      Lonia Blood, M.D.  Electronically Signed     JTM/MEDQ  D:  05/24/2006  T:  05/24/2006  Job:  [id number]

## 2011-01-30 NOTE — Consult Note (Signed)
Patricia Cuevas, Patricia Cuevas              ACCOUNT NO.:  [id number]   MEDICAL RECORD NO.:  [id number]          PATIENT TYPE:  INP   LOCATION:  1409                         FACILITY:  [facility]   PHYSICIAN:  Luis Abed, MD, FACCDATE OF BIRTH:  [birth year]   DATE OF CONSULTATION:  05/24/2006  DATE OF DISCHARGE:                                   CONSULTATION   PRIMARY CARE PHYSICIAN:  Aurelio Brash, N.P.  at [facility].   PRIMARY CARDIOLOGIST:  Dr. Lambert Keto at [facility].   CHIEF COMPLAINT:  Chest pain.   HISTORY OF PRESENT ILLNESS:  [name] is a 60 year old African American  female with a history of coronary artery disease who describes a long  history of exertional chest pain.  She saw her primary cardiologist and  apparently had an exercise treadmill test but not a nuclear study which is  reported to me as negative.  She describes the pain as burning and as it  gets worse there is also a shooting pain.  She feels that this is eased off  by someone pressing on her back and the scapular area, by rest and by  nitroglycerin and by Tums.  She states that she gets these symptoms  consistently when she exerts herself and that they have progressed to the  point that she is unable to walk from her home to the sidewalk without pain.   Yesterday at supper she has part of a hotdog, a piece of barbecued chicken  and some banana pudding.  After eating all this, she developed burning chest  pain for which she took a total of five Tums.  This did not resolve so she  took sublingual nitroglycerin.  When that did not relieve her symptoms, she  took a second sublingual nitroglycerin.  She states the pain was initially a  10/10 and after the medication it decreased to 03/10.  Her daughter was  concerned, so brought her to the emergency room anyway.  She developed  worsening chest pain and it reached again 10/10.  According to the record  the only medication she was given was  morphine.  She states that this does  help temporarily.  Since admission she has had five or six episodes but she  is getting complete relief of the symptoms in between.  Each episode last 2-  3 minutes.  She states that the symptoms she has been having with exertion  at home are the same as the symptoms she had yesterday but not as bad.  She  is currently pain free.   PAST MEDICAL HISTORY:  1. She is status post MI x2 with percutaneous intervention (records sent      for from Dr. Sharyl Nimrod office and from [facility]).  2. Hypertension.  3. Hyperlipidemia.  4. Obesity.  5. Ongoing tobacco use.  6. Family history of coronary artery disease.  7. Diabetes diagnosed today.  8. History of osteoporosis.  9. Recently diagnosed with urinary tract infection.  10.Remote history of breast cancer.   SURGICAL HISTORY:  She is status post cardiac catheterization as  well as  stomach stapling about 20 years ago, hysterectomy and bilateral mastectomy.   ALLERGIES:  No known drug allergies.   MEDICATIONS:  Currently include aspirin 81 mg a day.  Lovenox 100 mg q.12 h.  Zestril 10 mg a day.  Lopressor 50 mg b.i.d.  Protonix 40 mg a day.  Nitroglycerin paste 1/2 inch q. 6 hours.   SOCIAL HISTORY:  She lives in [place] with her daughter and two  grandchildren.  She is unemployed.  She has a 30 pack year history of  tobacco use.  She denies alcohol or drug abuse.   FAMILY HISTORY:  Both of her parents died in their 108s.  Her father had  heart disease.  Her mother did not.  Her sister died with heart disease in  her 34s.   REVIEW OF SYSTEMS:  About 20 years ago before the stomach stapling she  weighed 525 pounds.  She has multiple GI symptoms including abdominal pain,  frequent reflux symptoms as well as nausea and vomiting when she tries even  when she only eats a very small amount of food.  Yesterday was an usually  large meal.  She has some chronic dyspnea on exertion but exerts  herself  very little.  She has multiple arthralgias including right lower extremity  pain.  She denies any dysuria at this time.  Review of systems is otherwise  negative.   PHYSICAL EXAM:  VITAL SIGNS:  Temperature is 97.4, blood pressure 110/60,  heart rate 56, respiratory rate 20.  GENERAL:  She is well-developed obese African American female in no acute  distress.  HEENT:  Her head is normocephalic and atraumatic with pupils equal, round,  reactive to light accommodation, extraocular movements intact.  Sclera  clear, nares without discharge.  NECK:  There is no lymphadenopathy, thyromegaly, bruit or JVD noted.  CVA:  Heart is regular in rate and rhythm with an S1-S2 and soft systolic  murmur is noted.  Distal pulses are 2+ in all four extremities and no  femoral bruits are appreciated.  LUNGS:  With auscultation bilaterally.  SKIN:  No rashes or lesions noted.  ABDOMEN:  Soft with active bowel sounds.  Diffuse tenderness but no  splenomegaly is noted.  EXTREMITIES:  Her right lower extremity is diffusely tender.  There is no  point tenderness.  No cords are noted and there is no specific calf  tenderness.  There is no cyanosis, clubbing or edema.  MUSCULOSKELETAL:  There is no joint deformity or effusions and no spine or  CVA tenderness.  NEURO:  She is alert and oriented.  Cranial nerves II-XII grossly intact.   CHEST X-RAY:  Shows decreased lung volumes and no acute disease.   EKG is sinus rhythm rate 62 with no acute ischemic changes.   LABORATORY VALUES:  Hemoglobin A1c 7.3, D-dimer 0.27.  CK-MB #1 356/5.8, #2  301/5.6 (the index within normal limits).  Troponin I 0.04 then 0.09.  Point  care markers prior to these enzymes were negative x3.  Total cholesterol  237, triglycerides 162, HDL 42, LDL 163, hemoglobin 13.3, hematocrit 39.4,  WBC 6.5, platelets 237, sodium 139, potassium 3.9, chloride 105, CO2 28, BUN 13, creatinine 0.5, glucose 156, INR 1.1.   Chest pain is  difficult to distinguish GI symptoms from anginal symptoms but  her history is concerning for exertional angina and her troponin is mildly  elevated.  It is appropriate to continue her aspirin, Lovenox, beta blocker  and statin but  we will not add a 2B3A at this time.  Cardiac catheterization  versus stress testing was discussed with the patient, her ex-husband and her  daughter.  The risks and benefits of the cardiac catheterization as well as  a stress test were discussed.  Cardiac catheterization is indicated to  further define her anatomy as she is at high risk for progression of  coronary artery disease but the patient wishes to think about it and discuss  it with the family before she finally decides.   GI symptoms:  [name] was not on a daily proton pump inhibitor prior to  admission.  She also had frequent episodes of nausea and vomiting.  It is  appropriate to have her on a daily proton pump inhibitor and she may need  nutritional counseling to help her get appropriate nutrition in a volume her  stomach will tolerate.   [name] is otherwise under the excellent care of the [facility] team.     ______________________________  Theodore Demark, PA-C    ______________________________  Luis Abed, MD, [facility]    RB/MEDQ  D:  05/24/2006  T:  05/25/2006  Job:  [id number]   cc:   Dr. Harlin [facility]

## 2011-01-30 NOTE — Assessment & Plan Note (Signed)
[facility]                            CARDIOLOGY OFFICE NOTE   DANETTA, PROM                     MRN:          [id number]  DATE:11/16/2006                            DOB:          1951-08-26    [name] is seen for cardiology followup.  I had seen her last in  October of 2007.  Since that time, she has been admitted to [facility].  She had chest discomfort and was admitted for a short evaluation.  Enzymes were negative.  It was felt that she could be discharged home  with an outpatient Myoview scan.  This was done today.  It is normal.  There is no sign of scar or ischemia.   The patient notes today that she has discomfort in her right foot and  knee and shoulder.  When she has this discomfort, it causes her to have  more difficulty with her chest.  Overall, I believe her cardiac status  is stable.   PAST MEDICAL HISTORY:   ALLERGIES:  NO KNOWN DRUG ALLERGIES.   MEDICATIONS:  1. Lipitor 80.  2. Lisinopril 5.  3. Metoprolol 25 b.i.d.  4. Aspirin 325.  5. Arimidex 1.  6. Lasix 40.  7. Fosamax daily.  8. KCl and albuterol as directed.   OTHER MEDICAL PROBLEMS:  See the list below.   REVIEW OF SYSTEMS:  Patient is not having any GI or GU symptoms.  She  has pain in her right shoulder when she combs her hair.  She has  discomfort in her knee and her foot when she tries to walk.  Otherwise,  her review of systems is negative.   PHYSICAL EXAMINATION:  Weight today is 213 pounds.  This is similar to  the prior visit.  Blood pressure is 116/78 with a pulse of 73.  The patient is oriented to person, time, and place.  Her affect is  normal.  There is no xanthelasma.  There is normal extraocular motion.  LUNGS:  Clear.  Respiratory effort is not labored.  CARDIAC EXAM:  Reveals an S1 with an S2.  There are no clicks or  significant murmurs.  ABDOMEN:  Obese.  She has no significant peripheral edema.  She does have soreness when  moving her knee without any marked physical findings, and the same is  true in her shoulder.   Patient underwent a stress Myoview scan today done with adenosine.  It  is normal.  There is no sign of scar or ischemia.   PROBLEMS:  1. History of tobacco use in the past.  2. History of stomach stapling in 1985 with significant weight loss      after that.  3. History of good left ventricular function.  4. History of coronary artery bypass grafting, May 31, 2006,      with a coronary artery bypass grafting x6, left internal mammary      artery to the left anterior descending, saphenous vein graft to the      diagonal, sequential saphenous vein graft to the first obtuse      marginal and  second obtuse marginal of the circumflex, and      sequential saphenous vein graft to the distal right and posterior      descending.  5. Recurrent chest pain recently with an admission with no myocardial      infarction, and a Myoview scan showing no marked ischemia.  6. Status post hysterectomy.  7. History of breast cancer, status post bilateral mastectomy.  8. Hypercholesterolemia, on medication.  9. Osteoporosis.  The patient is on Fosamax.  10.Pain in the shoulder and in her knee.  I have encouraged her to see      her primary physician.  She is trying to have her primary care      transferred here to [facility].  In the meantime, if she needs to      take a medication for this discomfort, naproxen will be the drug of      choice from the cardiovascular viewpoint.  This can be obtained      through the brand name Aleve or in a generic form.  I will see her      back for cardiology followup in 6 months.     Luis Abed, MD, [facility]  Electronically Signed    JDK/MedQ  DD: 11/16/2006  DT: 11/16/2006  Job #: [id number]   cc:   Aurelio Brash, N.P., [facility]

## 2011-01-30 NOTE — Discharge Summary (Signed)
NAMELAVORIS, CANIZALES              ACCOUNT NO.:  [id number]   MEDICAL RECORD NO.:  [id number]          PATIENT TYPE:  INP   LOCATION:  2021                         FACILITY:  [facility]   PHYSICIAN:  Evelene Croon, M.D.     DATE OF BIRTH:  1950-12-02   DATE OF ADMISSION:  05/26/2006  DATE OF DISCHARGE:                                 DISCHARGE SUMMARY   PRIMARY DIAGNOSIS:  Severe 3-vessel coronary artery disease with unstable  angina status post non-ST-segment elevation myocardial infarction.   SECONDARY DIAGNOSES:  1. History of coronary artery disease status post myocardial infarction      with percutaneous intervention.  2. Hypertension.  3. Hyperlipidemia.  4. Obesity.  5. Ongoing tobacco use.  6. Family history of coronary artery disease.  7. Newly diagnosed diabetes mellitus.  8. History of osteoporosis.  9. Remote history of breast cancer.   IN HOSPITAL OPERATIONS/PROCEDURES:  1. Cardiac catheterization.  2. Coronary artery bypass grafting x6 using left internal mammary artery      to left anterior descending coronary artery, saphenous vein graft to      diagonal branch of LAD, sequential saphenous vein graft to first and      second obtuse marginal branches of the left circumflex coronary artery,      sequential saphenous vein graft to distal right coronary artery and      posterior descending coronary artery.  3. Endoscopic vein harvesting of right and left leg.   HISTORY AND PHYSICAL/HOSPITAL COURSE:  The patient is a 60 year old female  who was admitted with unstable angina ruled in for a non-ST segment  elevation myocardial infarction.  She had a history of coronary artery  disease status post stenting of her left circumflex coronary artery in the  past.  Repeat cardiac catheterization on 05/26/2006, showed severe 3-vessel  coronary artery disease.  LAD had a 50% stenosis at the diagonal branch and  then 90% segmental stenosis.  The diagonal itself was moderate in  size  vessel that had 40% to 50% proximal stenosis.  The left circumflex had 80%  stenosis before the stent.  The stent extended up to the bifurcation of the  first and second marginal branches.  The stent was widely patent.  The right  coronary artery was diffusely diseased with 40% proximal long, 80% mid  vessel stenosis.  Posterior descending branch had a 90% ostial stenosis.  Left ventricular function was normal.  There was no significant gradient  across the aortic valve and no mitral regurgitation.   Following catheterization, Dr. Laneta Simmers was consulted.  Dr. Laneta Simmers saw and  evaluated the patient.  He discussed with the patient undergoing coronary  artery bypass grafting.  He discussed the risks and benefits.  The patient  acknowledged her understanding and agreed to proceed.  Surgery was scheduled  for 05/31/2006.  Prior to undergoing surgery, the patient had bilateral  carotid duplex ultrasound scan done which showed no significant ICA  stenosis.  She did remain stable preoperatively.   For details of the patient's past medical history and physical examination  please see dictated history and  physical.   The patient was taken to the operating room on 05/31/2006, where she  underwent coronary artery bypass grafting x6 using the left internal mammary  artery graft to left anterior descending coronary artery, saphenous vein  graft to diagonal branch of the LAD, sequential saphenous vein graft to  first and second obtuse marginal branches of left circumflex coronary  artery, sequential saphenous vein graft to distal right coronary artery and  posterior descending coronary artery.  Endoscopic vein harvesting from right  and left leg. The patient tolerated the procedure well and was transferred  up to intensive care unit  in stable condition.   The patient's postoperative course is pretty much unremarkable.  Mainly  postoperatively she was seen to be hemodynamically stable.  She did  remain  intubated overnight due to poor weaning parameters.  Patient was able to be  extubated the morning of postop day 1.  Following extubation, the patient is  out of bed to chair.  She did have a low grade temperature postop day 1,  this was monitored which resolved by evening of postop day 1.  She remained  afebrile the remainder of her postoperative course.  The patient was out of  bed, ambulating well by postoperative day 2.  She was transferred out to  2000 on postoperative day 2.  Vital signs are monitored during her hospital  course and seemed to be stable.  She was able to be weaned off oxygen  satting greater than 90% on room air.  Aggressive incentive spirometer were  encouraged.  The patient seemed to be in normal sinus rhythm  postoperatively.  Respiratory was clear to auscultation bilaterally.  The  incision is clean, dry and intact and healing well.  She did have some  volume overload postoperatively and she was started diuretic.  Last weight  obtained postoperative day 4 was 231 pounds, preoperative of 225.  The  patient was tolerating a regular diet well, no nausea or vomiting noted.  Bowel movements within normal limits.  The patient was newly diagnosed  diabetic on admission.  Blood sugars were monitored postoperatively, the  major seemed to be stable.  The patient was not started on any insulin or  p.o. medication.  We will arrange for outpatient diabetes education.   The patient seemed to be ready for discharge home postoperative day 5,  06/05/2006.   FOLLOWUP:  A follow up appointment was arranged with Dr. Cornelius Moras on 06/28/2006  at 11:30 a.m.  The patient will need to follow up with Dr. Myrtis Ser in 2 weeks.  She will need to contact him for this appointment.  PA and lateral chest x-  ray will be obtained at this appointment which she will then bring with her  to Dr. Orvan July appointment.  ACTIVITY:  The patient instructed no driving until released to do so, no  lifting  over 10 pounds.  She was told to ambulate 3-4 times per day,  progress as tolerating.  Continue her breathing exercises.   INCISIONAL CARE:  The patient was told she is allowed to shower, washing her  incision using soap and water.  She is to contact the office if she develops  any drainage from any of her incision sites.  She is also to contact the  office if she develops temperature greater than 101.0 degree Fahrenheit.   DIET:  The patient was instructed on diet to be low fat, low salt.   DISCHARGE MEDICATIONS:  1. Aspirin 325 mg daily.  2. Lopressor 25 mg two times daily.  3. Lisinopril 5 mg daily.  4. Lipitor 80 mg at night.  5. Lasix 40 mg daily x5 days.  6. Potassium chloride 20 mEq daily x5 days.  7. Arimidex 1 mg daily.  8. Fosamax daily.  9. Albuterol 2 puffs p.r.n.  10.Oxycodone 5 mg 1-2 tabs every 4-6 hours p.r.n. pain.      Theda Belfast, [place]      Evelene Croon, M.D.  Electronically Signed    KMD/MEDQ  D:  06/04/2006  T:  06/04/2006  Job:  [id number]

## 2011-01-30 NOTE — Cardiovascular Report (Signed)
[name], Patricia              ACCOUNT NO.:  [id number]   MEDICAL RECORD NO.:  [id number]          PATIENT TYPE:  INP   LOCATION:  2902                         FACILITY:  [facility]   PHYSICIAN:  Arturo Morton. Riley Kill, MD, FACCDATE OF BIRTH:  March 07, 1951   DATE OF PROCEDURE:  DATE OF DISCHARGE:  05/26/2006                              CARDIAC CATHETERIZATION   INDICATIONS:  [name] is a 60 year old woman who presents with non-ST-  elevation MI with borderline enzymes.  She has had previous stenting in [place] to the circumflex coronary artery.  The current study is done to  assess coronary anatomy.   PROCEDURE:  1. Left heart catheterization.  2. Selective coronary arteriography.  3. Selective left ventriculography.  4. Subclavian angiography.   DESCRIPTION OF PROCEDURE:  The patient was brought to the catheterization  laboratory and prepped and draped in the usual fashion.  Through an anterior  puncture the right femoral artery was easily entered and a 6-French sheath  was placed.  Views of left and right coronaries were obtained in multiple  angiographic projections.  We obtained a subclavian view because of the need  for potential revascularization surgery.  Central aortic and left  ventricular pressures were measured with a pigtail.  Ventriculography was  performed in the RAO projection.  There were no complications.  The patient  was taken to the holding area in satisfactory clinical condition.   During the course the procedure the patient did receive labetalol because of  elevated blood pressure.   HEMODYNAMIC DATA:  1. Central aortic pressure 166/100, mean 128.  2. Left ventricular pressure 157/14.  3. No gradient pullback across aortic valve.   ANGIOGRAPHIC DATA:  1. Ventriculography was done in the RAO projection.  Overall systolic      function is preserved.  No definite wall motion abnormalities are seen.  2. The subclavian appears to be widely patent as does the  internal      mammary.  3. The right coronary artery is fairly heavily calcified and diffusely      diseased.  There is about 40% narrowing proximally, then segmental      disease of 50% up to 80% in the mid vessel overlying the RV branch.      Distally the PDA has what appears to be an 80-90% ostial stenosis.      There is collateralization of the PDA from the left coronary injection.  4. The left main is free of critical disease.  5. The LAD courses to the apex.  After the major diagonal is a 50%      segmental lesion, then a 90% area of focal stenosis.  The diagonal      itself has a fair amount of diffuse disease with 40% and 50% narrowing.  6. The circumflex has about 40% narrowing and then an 80% eccentric      stenosis prior to the stent.  The stent does not demonstrate extensive      in-stent restenosis but is patent.  The distal vessel consists of two      fairly large marginal  branches.   CONCLUSIONS:  1. Preserved left ventricular function.  2. Advance three-vessel coronary artery disease.   DISPOSITION:  The patient has evidence of three-vessel coronary disease and  preserved LV function.  She is a candidate for multivessel intervention but  the lesions are not ideal, involving both the ostium of the PDA and a  segmental lesion of the LAD.  She is a candidate, but revascularizations  surgery is also an option, and would result in decreased need for repeat  intervention at 1 year.  I will have the cardiac surgical team see the  patient and present the options, and I will discuss this with her as well.      Arturo Morton. Riley Kill, MD, [facility]  Electronically Signed     TDS/MEDQ  D:  05/26/2006  T:  05/26/2006  Job:  [id number]   cc:   Lonia Blood, M.D.  Thomas C. Wall, MD, [facility]  CV Laboratory

## 2011-01-30 NOTE — Assessment & Plan Note (Signed)
[facility]                              CARDIOLOGY OFFICE NOTE   KAZUE, CERRO                     MRN:          [id number]  DATE:06/17/2006                            DOB:          [birth year]    I saw [name] in consultation at [facility] on 05/24/2006.  She  has a history of coronary disease.  She had been followed in [facility], but  has asked that we follow her here on an ongoing basis.  She presented with  chest pain.  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  It was appropriate  to proceed with cardiac catheterization with her symptoms. This was done on  05/26/2006 by Dr. Riley Kill.  The patient had good LV function and advanced 3-  vessel disease. It was felt that CABG was indicated.  Surgery was done on  05/31/2006 by Dr. Laneta Simmers. The patient received CABG x6 with a LIMA to the  LAD, SVG to the diagonal, sequential SVG to the first and second OM of the  circumflex, sequential SVG to the distal right and the posterior descending.  She recovered nicely and has done well and she is now here for followup.  She is recovering and is not having any significant problems.   ALLERGIES:  No known drug allergies.   MEDICATIONS:  1. Lipitor 80.  2. __________ 1 mg daily.  3. Lisinopril 5.  4. Metoprolol 25 b.i.d. and aspirin 325.   PAST MEDICAL HISTORY:  Other medical problems see the list below.   REVIEW OF SYSTEMS:  She is recovering, today, and doing relatively well.  She has no significant complaints.   PHYSICAL EXAMINATION:  The patient is stable.  She is significantly  overweight.  She is oriented to person, time, and place.  Affect is normal.  Blood pressure is 109/76 with a pulse of 57.  Weight is 213 pounds.  HEENT:  Reveals no xanthelasma.  She has normal extraocular motion.  There were no carotid bruits. There is no jugular venous distention.  LUNGS:  Reveal some decreased breath sounds in her right base.  CARDIAC  EXAM:  Reveals an S1 with an S2.  There are no clicks or significant  murmurs.  Her chest wound is nicely healing.  Her legs are healed.  Her  abdomen is obese but soft. She has no significant peripheral edema.   EKG:  Today shows sinus rhythm.  She does have inverted T waves in V1-V3.   PROBLEMS INCLUDE:  1. History of tobacco in the past.  2. History of stomach stapling in 1985.  3. Hypertension.  4. Coronary disease, now status post coronary artery bypass graft.  5. Status post hysterectomy.  6. History of breast cancer, status post bilateral mastectomies.  7. Hypercholesterolemia.  8. Diabetes.  9. Osteoporosis.   Overall she is doing well.  She needs a chest x-ray today, which will be  obtained and she will take it with her to her appointment with Dr. Cornelius Moras who  is following up with Dr. Laneta Simmers.  I will see her  back for cardiology  followup in 4 weeks.            ______________________________  Luis Abed, MD, [facility]     JDK/MedQ  DD:  06/17/2006  DT:  06/18/2006  Job #:  [id number]   cc:   [facility] Aurelio Brash, NP

## 2011-02-04 ENCOUNTER — Encounter: Payer: Self-pay | Admitting: Cardiology

## 2011-02-05 ENCOUNTER — Ambulatory Visit (INDEPENDENT_AMBULATORY_CARE_PROVIDER_SITE_OTHER): Payer: Medicaid Other | Admitting: Cardiology

## 2011-02-05 ENCOUNTER — Encounter: Payer: Self-pay | Admitting: Cardiology

## 2011-02-05 ENCOUNTER — Emergency Department (HOSPITAL_COMMUNITY): Payer: Medicaid Other

## 2011-02-05 ENCOUNTER — Emergency Department (HOSPITAL_COMMUNITY)
Admission: EM | Admit: 2011-02-05 | Discharge: 2011-02-06 | Disposition: A | Discharge status: Home / Self Care | Payer: Medicaid Other | Attending: Emergency Medicine | Admitting: Emergency Medicine

## 2011-02-05 ENCOUNTER — Telehealth: Payer: Self-pay | Admitting: Gastroenterology

## 2011-02-05 DIAGNOSIS — E119 Type 2 diabetes mellitus without complications: Secondary | ICD-10-CM | POA: Insufficient documentation

## 2011-02-05 DIAGNOSIS — I2510 Atherosclerotic heart disease of native coronary artery without angina pectoris: Secondary | ICD-10-CM

## 2011-02-05 DIAGNOSIS — M7989 Other specified soft tissue disorders: Secondary | ICD-10-CM | POA: Insufficient documentation

## 2011-02-05 DIAGNOSIS — I10 Essential (primary) hypertension: Secondary | ICD-10-CM

## 2011-02-05 DIAGNOSIS — Y9289 Other specified places as the place of occurrence of the external cause: Secondary | ICD-10-CM | POA: Insufficient documentation

## 2011-02-05 DIAGNOSIS — J45909 Unspecified asthma, uncomplicated: Secondary | ICD-10-CM | POA: Insufficient documentation

## 2011-02-05 DIAGNOSIS — J3489 Other specified disorders of nose and nasal sinuses: Secondary | ICD-10-CM | POA: Insufficient documentation

## 2011-02-05 DIAGNOSIS — G4733 Obstructive sleep apnea (adult) (pediatric): Secondary | ICD-10-CM

## 2011-02-05 DIAGNOSIS — K219 Gastro-esophageal reflux disease without esophagitis: Secondary | ICD-10-CM

## 2011-02-05 DIAGNOSIS — E7800 Pure hypercholesterolemia, unspecified: Secondary | ICD-10-CM

## 2011-02-05 DIAGNOSIS — X500XXA Overexertion from strenuous movement or load, initial encounter: Secondary | ICD-10-CM | POA: Insufficient documentation

## 2011-02-05 DIAGNOSIS — R072 Precordial pain: Secondary | ICD-10-CM

## 2011-02-05 DIAGNOSIS — S93609A Unspecified sprain of unspecified foot, initial encounter: Secondary | ICD-10-CM | POA: Insufficient documentation

## 2011-02-05 DIAGNOSIS — M79609 Pain in unspecified limb: Secondary | ICD-10-CM | POA: Insufficient documentation

## 2011-02-05 NOTE — Patient Instructions (Signed)
Your physician recommends that you have lab work today: BMP and CBC  Your physician recommends that you continue on your current medications as directed. Please refer to the Current Medication list given to you today.  You have been referred to Dr Marcelyn Bruins for Obstructive Sleep Apnea and Dr Lina Sar for GI work-up  Your physician has requested that you have a lexiscan myoview. For further information please visit [email or web address] Please follow instruction sheet, as given.  Your physician recommends that you schedule a follow-up appointment in: 1 WEEK with Dr Myrtis Ser after [facility]

## 2011-02-06 LAB — BASIC METABOLIC PANEL
BUN: 10 mg/dL (ref 6–23)
CO2: 30 mEq/L (ref 19–32)
Chloride: 103 mEq/L (ref 96–112)
Creatinine, Ser: 0.9 mg/dL (ref 0.4–1.2)
Glucose, Bld: 117 mg/dL — ABNORMAL HIGH (ref 70–99)

## 2011-02-06 LAB — CBC WITH DIFFERENTIAL/PLATELET
Basophils Relative: 0.3 % (ref 0.0–3.0)
Hemoglobin: 13.6 g/dL (ref 12.0–15.0)
Lymphocytes Relative: 39.8 % (ref 12.0–46.0)
MCHC: 33.2 g/dL (ref 30.0–36.0)
Monocytes Relative: 4.5 % (ref 3.0–12.0)
Neutro Abs: 4.2 10*3/uL (ref 1.4–7.7)
RBC: 4.67 Mil/uL (ref 3.87–5.11)

## 2011-02-06 NOTE — Telephone Encounter (Signed)
That is good.  She had EGD with DR. Arlyce Dice 6 months ago in hospital already.

## 2011-02-06 NOTE — Telephone Encounter (Signed)
Pt put on for 03/10/11 for non cardiac chest pain.  Dr Riley Kill wanted pt seen in 2-3 weeks.  Merita Norton Dr Thomasene Mohair nurse asked if she could be worked in sooner with extender but this is not an urgent appt.  Is 03/10/11 ok?

## 2011-02-11 ENCOUNTER — Ambulatory Visit (HOSPITAL_COMMUNITY): Payer: Medicaid Other | Attending: Cardiology | Admitting: Radiology

## 2011-02-11 ENCOUNTER — Encounter: Payer: Self-pay | Admitting: Cardiology

## 2011-02-11 ENCOUNTER — Telehealth: Payer: Self-pay | Admitting: Cardiology

## 2011-02-11 DIAGNOSIS — R072 Precordial pain: Secondary | ICD-10-CM | POA: Insufficient documentation

## 2011-02-11 DIAGNOSIS — E119 Type 2 diabetes mellitus without complications: Secondary | ICD-10-CM

## 2011-02-11 DIAGNOSIS — I2510 Atherosclerotic heart disease of native coronary artery without angina pectoris: Secondary | ICD-10-CM

## 2011-02-11 DIAGNOSIS — R079 Chest pain, unspecified: Secondary | ICD-10-CM

## 2011-02-11 DIAGNOSIS — I25810 Atherosclerosis of coronary artery bypass graft(s) without angina pectoris: Secondary | ICD-10-CM

## 2011-02-11 MED ORDER — TECHNETIUM TC 99M TETROFOSMIN IV KIT
33.0000 | PACK | Freq: Once | INTRAVENOUS | Status: AC | PRN
  Administered 2011-02-11: 33 via INTRAVENOUS

## 2011-02-11 MED ORDER — REGADENOSON 0.4 MG/5ML IV SOLN
0.4000 mg | Freq: Once | INTRAVENOUS | Status: AC
  Administered 2011-02-11: 0.4 mg via INTRAVENOUS

## 2011-02-11 MED ORDER — TECHNETIUM TC 99M TETROFOSMIN IV KIT
11.0000 | PACK | Freq: Once | INTRAVENOUS | Status: AC | PRN
  Administered 2011-02-11: 11 via INTRAVENOUS

## 2011-02-11 NOTE — Telephone Encounter (Signed)
Calling re status of medical clearence

## 2011-02-11 NOTE — Progress Notes (Signed)
[facility] SITE 3 NUCLEAR MED [address] [place] [address] [phone]  Cardiology Nuclear Med Study  Patricia Cuevas is a 60 y.o. female [id number] 08-15-1951   Nuclear Med Background Indication for Stress Test:  Evaluation for Ischemia and Graft Patency History: '07 CABG: x6, 05/12 Echo: EF 65% @ [facility],07/18/10 Heart Catheterization: Occludded SVG-DIAG Med-Tx , 09/10 MPS: INF Thinning/(-) ischemia '07 Myocardial Infarction, and Stents: Prior to Bypass Cardiac Risk Factors: Family History - CAD, History of Smoking, Hypertension, Lipids and NIDDM  Symptoms:  Chest Pain and Palpitations   Nuclear Pre-Procedure Caffeine/Decaff Intake:  None NPO After: 9:00pm   Lungs:  clear IV 0.9% NS with Angio Cath:  22g  IV Site: R Hand  IV Started by:  Stanton Kidney, EMT-P  Chest Size (in):  48 Cup Size: n/a  Height: 4\' 11"  (1.499 m)  Weight:  224 lb (101.606 kg)  BMI:  Body mass index is 45.24 kg/(m^2). Tech Comments:  NA    Nuclear Med Study 1 or 2 day study: 1 day  Stress Test Type:  Eugenie Birks  Reading MD: Kristeen Miss, MD  Order Authorizing Provider:  J.Katz  Resting Radionuclide: Technetium 41m Tetrofosmin  Resting Radionuclide Dose: 11 mCi   Stress Radionuclide:  Technetium 65m Tetrofosmin  Stress Radionuclide Dose: 33 mCi           Stress Protocol Rest HR: 75 Stress HR: 107  Rest BP: 126/78 Stress BP: 136/76  Exercise Time (min): n/a METS: n/a   Predicted Max HR: 159 bpm % Max HR: 67.3 bpm Rate Pressure Product: 21308   Dose of Adenosine (mg):  n/a Dose of Lexiscan: 0.4 mg  Dose of Atropine (mg): n/a Dose of Dobutamine: n/a mcg/kg/min (at max HR)  Stress Test Technologist: Milana Na, EMT-P  Nuclear Technologist:  Domenic Polite, CNMT     Rest Procedure:  Myocardial perfusion imaging was performed at rest 45 minutes following the intravenous administration of Technetium 70m Tetrofosmin. Rest ECG: NSR  Stress Procedure:  The patient received  IV Lexiscan 0.4 mg over 15-seconds.  Technetium 71m Tetrofosmin injected at 30-seconds.  There were non significant changes and occ pvcs/pacs  with Lexiscan.  Quantitative spect images were obtained after a 45 minute delay. Stress ECG: No significant change from baseline ECG  QPS Raw Data Images:  Mild diaphragmatic attenuation.  Normal left ventricular size. Stress Images:  There is a small area of mild attenuation in the basal inferior region. Rest Images:  There is a small area of mild attenuation in the basal inferior region. Subtraction (SDS):  No evidence of ischemia. Transient Ischemic Dilatation (Normal <1.22):  0.97 Lung/Heart Ratio (Normal <0.45):  0.39  Quantitative Gated Spect Images QGS EDV:  75 ml QGS ESV:  27 ml QGS cine images:  NL LV Function; NL Wall Motion QGS EF: 64%  Impression Exercise Capacity:  Lexiscan with no exercise. BP Response:  Normal blood pressure response. Clinical Symptoms:  No chest pain. ECG Impression:  No significant ST segment change suggestive of ischemia. Comparison with Prior Nuclear Study: No significant change from previous study  Overall Impression:  Low risk stress nuclear study.  There is a very small area of mild attenuation in the inferior basal region at rest and with exertion.  The uptake in the other regions is normal.  The LV function is normal and the inferior wall contracts well.  This attenuation may be due to  diaphragmatic attenuation.   Elyn Aquas., MD, [facility]

## 2011-02-11 NOTE — Telephone Encounter (Signed)
[facility]--pt had myoview today and is scheduled to see Dr Myrtis Ser on 02/16/11 for follow-up.  This is Dr Henrietta Hoover patient.  Pt was suppose to have a GI procedure but this was cancelled due to pt complaining of CP. I spoke with Dorathy Daft and made her aware of this information. She request that we fax a note of clearance to [phone] after her 02/16/11 OV.

## 2011-02-12 ENCOUNTER — Encounter: Payer: Self-pay | Admitting: Cardiology

## 2011-02-12 DIAGNOSIS — G4733 Obstructive sleep apnea (adult) (pediatric): Secondary | ICD-10-CM | POA: Insufficient documentation

## 2011-02-12 DIAGNOSIS — R112 Nausea with vomiting, unspecified: Secondary | ICD-10-CM | POA: Insufficient documentation

## 2011-02-12 DIAGNOSIS — I2510 Atherosclerotic heart disease of native coronary artery without angina pectoris: Secondary | ICD-10-CM | POA: Insufficient documentation

## 2011-02-12 DIAGNOSIS — Z951 Presence of aortocoronary bypass graft: Secondary | ICD-10-CM | POA: Insufficient documentation

## 2011-02-12 DIAGNOSIS — E663 Overweight: Secondary | ICD-10-CM | POA: Insufficient documentation

## 2011-02-12 NOTE — Progress Notes (Signed)
Copy routed to Dr. Katz.Falecha L Clark ° ° °

## 2011-02-16 ENCOUNTER — Ambulatory Visit (INDEPENDENT_AMBULATORY_CARE_PROVIDER_SITE_OTHER): Payer: Medicaid Other | Admitting: Cardiology

## 2011-02-16 ENCOUNTER — Encounter: Payer: Self-pay | Admitting: Cardiology

## 2011-02-16 DIAGNOSIS — I10 Essential (primary) hypertension: Secondary | ICD-10-CM

## 2011-02-16 DIAGNOSIS — I25119 Atherosclerotic heart disease of native coronary artery with unspecified angina pectoris: Secondary | ICD-10-CM | POA: Insufficient documentation

## 2011-02-16 DIAGNOSIS — I2510 Atherosclerotic heart disease of native coronary artery without angina pectoris: Secondary | ICD-10-CM

## 2011-02-16 NOTE — Assessment & Plan Note (Signed)
Blood pressure is controlled. No change in therapy. 

## 2011-02-16 NOTE — Patient Instructions (Signed)
Your physician wants you to follow-up in:  6 months. You will receive a reminder letter in the mail two months in advance. If you don't receive a letter, please call our office to schedule the follow-up appointment.   

## 2011-02-16 NOTE — Progress Notes (Signed)
HPI Patient is seen for the continued evaluation of coronary artery disease.  She is having a GI workup in [facility] and we need further cardiac information before her GI workup can be completed.  She was seen in the office on Feb 05, 2011. It was felt that she should have a nuclear scan and this was arranged.  Skin was done Feb 11, 2011.  Ejection fraction was 64%.  There was no scar or ischemia.  Patient has known coronary disease.  She underwent CABG in 2007.  Her last catheterization was done in November, 2011.  The LIMA was patent to the LAD.  There was small caliber distal vessel with diffuse plaque.  There was a patent SVG to the first and second OM.  There was patent SVG to the posterior descending and posterolateral.  There was an occluded SVG to the diagonal.  Medical therapy had been recommended at that time.  Patient is not having any exertional symptoms at this time. No Known Allergies  Current Outpatient Prescriptions  Medication Sig Dispense Refill  . albuterol (PROVENTIL) (2.5 MG/3ML) 0.083% nebulizer solution Take 2.5 mg by nebulization every 6 (six) hours as needed.        [name] Kitchen atorvastatin (LIPITOR) 80 MG tablet Take 80 mg by mouth daily.        . furosemide (LASIX) 40 MG tablet Take 40 mg by mouth daily.        [name] Kitchen HYDROcodone-acetaminophen (NORCO) 10-325 MG per tablet Take 1 tablet by mouth every 6 (six) hours as needed.        . isosorbide mononitrate (IMDUR) 60 MG 24 hr tablet Take 60 mg by mouth daily.        [name] Kitchen lisinopril-hydrochlorothiazide (PRINZIDE,ZESTORETIC) 20-12.5 MG per tablet Take 1 tablet by mouth daily.        . metFORMIN (GLUMETZA) 500 MG (MOD) 24 hr tablet Take 500 mg by mouth daily with breakfast.        . metoprolol tartrate (LOPRESSOR) 25 MG tablet Take 25 mg by mouth 2 (two) times daily.        . RABEprazole (ACIPHEX) 20 MG tablet Take 20 mg by mouth daily.        [name] Kitchen aspirin 81 MG tablet Take 81 mg by mouth daily.          History   Social History  .  Marital Status: Divorced    Spouse Name: N/A    Number of Children: N/A  . Years of Education: N/A   Occupational History  . Disabled    Social History Main Topics  . Smoking status: Former Games developer  . Smokeless tobacco: Not on file  . Alcohol Use: Not on file  . Drug Use: Not on file  . Sexually Active: Not on file   Other Topics Concern  . Not on file   Social History Narrative  . No narrative on file    Family History  Problem Relation Age of Onset  . Hypertension Mother   . Heart disease Mother   . Diabetes Mother   . Heart disease Father   . Heart disease Sister     Past Medical History  Diagnosis Date  . Hx of CABG     2007  . Breast cancer     Double mastectomy 2007  . Hypertension   . Diabetes mellitus   . Ejection fraction     EF 65%, echo, [place], Jan 16, 2011  . Sleep apnea  CPAP being arranged May, 2011  . Dyslipidemia   . CAD (coronary artery disease)     Nuclear stress test Feb 11, 2011, EF 64%, no scar or ischemia    //         catheterization, November, 2011,patent LIMA-LAD-small caliber distal vessel with diffuse plaque, patent SVG to OM1 and OM 2, patent SVG to PDA and PLA, occluded SVG to diagonal, medical therapy  . Overweight     stomach stapling 1985 followed by weight loss, then return of weight  . Tobacco abuse     in the past, resolved  . Alcohol abuse     in the past, resolved  . Nausea & vomiting     hospitalization, November, 2011, stricture GE junction, questionable stenosis gastrojejunostomy, disruptive primary peristaltic wave, GE reflux, delayed emptying proximal gastric pouch    Past Surgical History  Procedure Date  . Coronary artery bypass graft     ROS  Patient denies fever, chills, headache, sweats, rash, change in vision, change in hearing, chest pain, cough, nausea vomiting, urinary symptoms.  All other systems are reviewed and are negative  PHYSICAL EXAM Patient is overweight.  She is stable.  Head is  atraumatic.  There is no xanthelasma.  There is no jugular venous distention.  Lungs are clear.  Respiratory effort is nonlabored.  Cardiac exam reveals muscle and S2.  No clicks or significant murmurs.  The abdomen is soft.  There is no peripheral edema. Filed Vitals:   02/16/11 1032  BP: 115/72  Pulse: 78  Height: 4\' 11"  (1.499 m)  Weight: 220 lb (99.791 kg)    EKG Is not done today.  I have reviewed the nuclear exercise test report carefully as outlined above.  ASSESSMENT & PLAN

## 2011-02-16 NOTE — Progress Notes (Signed)
Dr Myrtis Ser reviewed w/pt at [facility] 6/4 Patricia Cuevas 11:01 AM

## 2011-02-16 NOTE — Assessment & Plan Note (Signed)
Coronary disease is stable.  The patient's recent nuclear test reveals no ischemia.  She is cleared to have further GI evaluation.

## 2011-02-18 ENCOUNTER — Institutional Professional Consult (permissible substitution): Payer: Medicaid Other | Admitting: Pulmonary Disease

## 2011-02-20 ENCOUNTER — Encounter: Payer: Self-pay | Admitting: Pulmonary Disease

## 2011-02-20 ENCOUNTER — Ambulatory Visit (INDEPENDENT_AMBULATORY_CARE_PROVIDER_SITE_OTHER): Payer: Medicaid Other | Admitting: Pulmonary Disease

## 2011-02-20 VITALS — BP 112/70 | HR 72 | Temp 98.0°F | Ht 59.0 in | Wt 222.2 lb

## 2011-02-20 DIAGNOSIS — G4730 Sleep apnea, unspecified: Secondary | ICD-10-CM

## 2011-02-20 NOTE — Patient Instructions (Signed)
Will start on cpap for your sleep apnea.  Please call if having tolerance issues Work on weight loss followup with me in 5 weeks.

## 2011-02-20 NOTE — Progress Notes (Signed)
  Subjective:    Patient ID: Patricia Cuevas, female    DOB: 1950-10-09, 60 y.o.   MRN: [id number]  HPI The pt is a 60y/o female who I have been asked to see for possible osa.  She apparently underwent a sleep study in [place] back in FEB?, and has never been contacted with the results.  She was found to have moderate osa, with AHI 23/hr.  Her history is significant for: -loud snoring and abnormal breathing pattern during sleep -frequent awakenings, and only rested upon arising 50% of the time. -definite sleep pressure during the day with inactivity, and can doze at times -sleepiness while a passenger in a car, but not while driving. -weight up 10 pounds over the last 2 yrs, and epworth score today is 10.  Sleep Questionnaire: What time do you typically go to bed?( Between what hours) 10pm-11pm How long does it take you to fall asleep? 2-3 minutes How many times during the night do you wake up? 3 What time do you get out of bed to start your day? 0730 Do you drive or operate heavy machinery in your occupation? No How much has your weight changed (up or down) over the past two years? (In pounds) 10 lb (4.536 kg) Have you ever had a sleep study before? Yes If yes, location of study? Across the street from [place] If yes, date of study? Feb 2012 Do you currently use CPAP? No Do you wear oxygen at any time? No    Review of Systems  Constitutional: Negative for fever and unexpected weight change.  HENT: Positive for rhinorrhea and sinus pressure. Negative for ear pain, nosebleeds, congestion, sore throat, sneezing, trouble swallowing, dental problem and postnasal drip.   Eyes: Positive for redness and itching.  Respiratory: Positive for shortness of breath. Negative for cough, chest tightness and wheezing.   Cardiovascular: Negative for palpitations and leg swelling.  Gastrointestinal: Negative for nausea and vomiting.  Genitourinary: Negative for dysuria.  Musculoskeletal: Negative for joint swelling.    Skin: Negative for rash.  Neurological: Negative for headaches.  Hematological: Does not bruise/bleed easily.  Psychiatric/Behavioral: Negative for dysphoric mood. The patient is not nervous/anxious.        Objective:   Physical Exam Constitutional:  Obese female, no acute distress  HENT:  Nares patent without discharge, large turbinates with narrowing.  Oropharynx without exudate, palate and uvula are elongated with soft tissue redundancy  Eyes:  Perrla, eomi, no scleral icterus  Neck:  No JVD, ?enlarged thyroid  Cardiovascular:  Normal rate, regular rhythm, no rubs or gallops.  No murmurs        Intact distal pulses  Pulmonary :  Normal breath sounds, no stridor or respiratory distress   No rales, rhonchi, or wheezing  Abdominal:  Soft, nondistended, bowel sounds present.  No tenderness noted.   Musculoskeletal: mimal lower extremity edema noted.  Lymph Nodes:  No cervical lymphadenopathy noted  Skin:  No cyanosis noted  Neurologic:  Alert, appropriate, moves all 4 extremities without obvious deficit.         Assessment & Plan:

## 2011-02-21 NOTE — Progress Notes (Signed)
HPI:  [name] is seen as an add on today.  3 weeks ago she had a sinus infection, and started walking and gets tired very easily.  She had some chest discomfort.  She is having some ongoing GI evaluation in [facility] and they wanted her assessed here.  Her last cath showed an occluded diagonal graft, although the flow to the native diagonal was excellent.  She did not get her workup completed here.  She was seen there and evaluated.  She probably should also have sleep evaluation  Current Outpatient Prescriptions  Medication Sig Dispense Refill  . albuterol (PROVENTIL) (2.5 MG/3ML) 0.083% nebulizer solution Take 2.5 mg by nebulization every 6 (six) hours as needed.        [name] Kitchen aspirin 81 MG tablet Take 81 mg by mouth daily.        [name] Kitchen atorvastatin (LIPITOR) 80 MG tablet Take 80 mg by mouth daily.        . furosemide (LASIX) 40 MG tablet Take 40 mg by mouth daily.        [name] Kitchen HYDROcodone-acetaminophen (NORCO) 10-325 MG per tablet Take 1 tablet by mouth every 6 (six) hours as needed.        . isosorbide mononitrate (IMDUR) 60 MG 24 hr tablet Take 60 mg by mouth daily.        [name] Kitchen lisinopril-hydrochlorothiazide (PRINZIDE,ZESTORETIC) 20-12.5 MG per tablet Take 1 tablet by mouth daily.        . metFORMIN (GLUMETZA) 500 MG (MOD) 24 hr tablet Take 500 mg by mouth daily with breakfast.        . metoprolol tartrate (LOPRESSOR) 25 MG tablet Take 25 mg by mouth 2 (two) times daily.        . RABEprazole (ACIPHEX) 20 MG tablet Take 20 mg by mouth daily.          No Known Allergies  Past Medical History  Diagnosis Date  . Hx of CABG     2007  . Breast cancer     Double mastectomy 2007  . Hypertension   . Diabetes mellitus   . Ejection fraction     EF 65%, echo, [place], Jan 16, 2011  . Sleep apnea     CPAP being arranged May, 2011  . Dyslipidemia   . CAD (coronary artery disease)     Nuclear stress test Feb 11, 2011, EF 64%, no scar or ischemia    //         catheterization, November, 2011,patent  LIMA-LAD-small caliber distal vessel with diffuse plaque, patent SVG to OM1 and OM 2, patent SVG to PDA and PLA, occluded SVG to diagonal, medical therapy  . Overweight     stomach stapling 1985 followed by weight loss, then return of weight  . Tobacco abuse     in the past, resolved  . Alcohol abuse     in the past, resolved  . Nausea & vomiting     hospitalization, November, 2011, stricture GE junction, questionable stenosis gastrojejunostomy, disruptive primary peristaltic wave, GE reflux, delayed emptying proximal gastric pouch    Past Surgical History  Procedure Date  . Coronary artery bypass graft     Family History  Problem Relation Age of Onset  . Hypertension Mother   . Heart disease Mother   . Diabetes Mother   . Heart disease Father   . Heart disease Sister   . Stomach cancer Sister   . Asthma Sister     History  Social History  . Marital Status: Divorced    Spouse Name: N/A    Number of Children: N/A  . Years of Education: N/A   Occupational History  . Disabled    Social History Main Topics  . Smoking status: Former Smoker -- 3.0 packs/day for 27 years    Types: Cigarettes    Quit date: 09/14/2000  . Smokeless tobacco: Former Neurosurgeon    Types: Chew    Quit date: 09/14/1990  . Alcohol Use: No  . Drug Use: Not on file  . Sexually Active: Not on file   Other Topics Concern  . Not on file   Social History Narrative  . No narrative on file    ROS: Please see the HPI.  All other systems reviewed and negative.  PHYSICAL EXAM:  BP 112/80  Pulse 80  Ht 4\' 11"  (1.499 m)  Wt 221 lb (100.245 kg)  BMI 44.64 kg/m2  General: Well developed, well nourished, in no acute distress. Head:  Normocephalic and atraumatic. Neck: no JVD Lungs: Clear to auscultation and percussion. Heart: Normal S1 and S2.  No murmur, rubs or gallops.  Abdomen:  Normal bowel sounds; soft; non tender; no organomegaly Pulses: Pulses normal in all 4 extremities. Extremities: No  clubbing or cyanosis. No edema. Neurologic: Alert and oriented x 3.  EKG:  NSR.  Nonspecific T wave abnormality  ASSESSMENT AND PLAN:

## 2011-02-24 DIAGNOSIS — K219 Gastro-esophageal reflux disease without esophagitis: Secondary | ICD-10-CM | POA: Insufficient documentation

## 2011-02-24 NOTE — Assessment & Plan Note (Signed)
Stable at present.   

## 2011-02-24 NOTE — Assessment & Plan Note (Signed)
Would favor doing a nuclear scan, although cath data looked pretty good.  After this, we will have her see Dr. Myrtis Ser to summarize:  Cath data November 2011   CONCLUSIONS:   1. Normal left ventricular function.   2. Patent internal mammary to the LAD with a small caliber distal       vessel that may demonstrate some diffuse plaque.   3. Patent saphenous vein graft to the first and second obtuse       marginals.   4. Continued patent saphenous vein graft to the PDA, PLA segment.   5. Occluded saphenous vein graft to the diagonal.   6. Preserved left ventricular function.      DISPOSITION:  Based on the above findings, I would continue medical   therapy.  We will get a D-dimer given her presentation.  She was   scheduled for endoscopy.  I will review the findings with Dr. Myrtis Ser.

## 2011-02-24 NOTE — Assessment & Plan Note (Signed)
On going assessment 

## 2011-02-26 ENCOUNTER — Emergency Department (HOSPITAL_COMMUNITY)
Admission: EM | Admit: 2011-02-26 | Discharge: 2011-02-27 | Disposition: A | Discharge status: Home / Self Care | Payer: Medicaid Other | Attending: Emergency Medicine | Admitting: Emergency Medicine

## 2011-02-26 DIAGNOSIS — Z87442 Personal history of urinary calculi: Secondary | ICD-10-CM | POA: Insufficient documentation

## 2011-02-26 DIAGNOSIS — J45909 Unspecified asthma, uncomplicated: Secondary | ICD-10-CM | POA: Insufficient documentation

## 2011-02-26 DIAGNOSIS — E119 Type 2 diabetes mellitus without complications: Secondary | ICD-10-CM | POA: Insufficient documentation

## 2011-02-26 DIAGNOSIS — N3090 Cystitis, unspecified without hematuria: Secondary | ICD-10-CM | POA: Insufficient documentation

## 2011-02-26 DIAGNOSIS — I10 Essential (primary) hypertension: Secondary | ICD-10-CM | POA: Insufficient documentation

## 2011-02-26 DIAGNOSIS — Z853 Personal history of malignant neoplasm of breast: Secondary | ICD-10-CM | POA: Insufficient documentation

## 2011-02-26 DIAGNOSIS — I2510 Atherosclerotic heart disease of native coronary artery without angina pectoris: Secondary | ICD-10-CM | POA: Insufficient documentation

## 2011-02-27 ENCOUNTER — Encounter: Payer: Self-pay | Admitting: Pulmonary Disease

## 2011-02-27 LAB — URINALYSIS, ROUTINE W REFLEX MICROSCOPIC
Bilirubin Urine: NEGATIVE
Glucose, UA: NEGATIVE mg/dL
Ketones, ur: NEGATIVE mg/dL
Protein, ur: NEGATIVE mg/dL
Urobilinogen, UA: 1 mg/dL (ref 0.0–1.0)

## 2011-02-27 LAB — URINE MICROSCOPIC-ADD ON

## 2011-02-27 NOTE — Assessment & Plan Note (Signed)
The pt has moderate osa by her sleep study in the past, and clearly is symptomatic at night and during the day.  I have had a long discussion with the pt about sleep apnea, including its impact on QOL and CV health.  Treatment options can include a trial of weight loss alone, upper airway surgery, dental appliance, and cpap.  After discussing with her, will start with cpap while she works on weight loss.  I will set the patient up on cpap at a moderate pressure level to allow for desensitization, and will troubleshoot the device over the next 4-6weeks if needed.  The pt is to call me if having issues with tolerance.  Will then optimize the pressure once patient is able to wear cpap on a consistent basis.

## 2011-03-01 LAB — URINE CULTURE
Colony Count: >=100000
Culture  Setup Time: 201206151218

## 2011-03-10 ENCOUNTER — Ambulatory Visit (INDEPENDENT_AMBULATORY_CARE_PROVIDER_SITE_OTHER): Payer: Medicaid Other | Admitting: Gastroenterology

## 2011-03-10 ENCOUNTER — Encounter: Payer: Self-pay | Admitting: Gastroenterology

## 2011-03-10 VITALS — BP 138/76 | HR 64 | Ht 59.0 in | Wt 224.0 lb

## 2011-03-10 DIAGNOSIS — K625 Hemorrhage of anus and rectum: Secondary | ICD-10-CM

## 2011-03-10 NOTE — Progress Notes (Signed)
HPI: This is a pleasant 60 year old woman who saw a GI MD in [place] for the  "non-cardiac CP), saw him 2 months ago. We have none of those records and she doesn't really recall what he has told her. She was referred here by Dr. Riley Kill for evaluation of NCP.  Dr. Rodena Piety.  I believe she had some chest pains and Dr. Noe Gens recommended she have repeat cardiac evaluation before proceeding with any interventional, invasive testing.  I did colonoscopy for her 11/2008; 2 small TAs removed, +hems, she was recommended to have repeat colonoscopy at 5 year interval.  Recently hospitalized and underwent EGD with Dr. Arlyce Dice for 'dysphagia, CP." He described "minimal GE narrowing" which was dilated up to 18mm with maloney, ?stricture at remote GJ anastomosis site.  That was followed up with UGI, she showed delayed gastric empyting that actually improved when she was supine (worse when standing).  She has seen red blood in stool.   She was also seeing red blood in her urine as well. Has not seen a urologist yet.    She saw blood in stool 2 months ago, 3 times, only with BMs, no constipation.  No any pain.  She underwent FOBtesting by PCP and it was positive.  Intermittent abd pains.  Overall weight is up and down.    CBC last month was normal     Review of systems: Pertinent positive and negative review of systems were noted in the above HPI section.  All other review of systems was otherwise negative.   Past Medical History  Diagnosis Date  . Hx of CABG     2007  . Breast cancer     Double mastectomy 2007  . Hypertension   . Diabetes mellitus   . Ejection fraction     EF 65%, echo, [place], Jan 16, 2011  . Sleep apnea     CPAP being arranged May, 2011  . Dyslipidemia   . CAD (coronary artery disease)     Nuclear stress test Feb 11, 2011, EF 64%, no scar or ischemia    //         catheterization, November, 2011,patent LIMA-LAD-small caliber distal vessel with diffuse plaque, patent SVG to OM1  and OM 2, patent SVG to PDA and PLA, occluded SVG to diagonal, medical therapy  . Overweight     stomach stapling 1985 followed by weight loss, then return of weight  . Tobacco abuse     in the past, resolved  . Alcohol abuse     in the past, resolved  . Nausea & vomiting     hospitalization, November, 2011, stricture GE junction, questionable stenosis gastrojejunostomy, disruptive primary peristaltic wave, GE reflux, delayed emptying proximal gastric pouch  . Hx of colonic polyps     Past Surgical History  Procedure Date  . Coronary artery bypass graft   . Mastectomy   . Coronary stent placement   . Abdominal hysterectomy      reports that she quit smoking about 10 years ago. Her smoking use included Cigarettes. She has a 81 pack-year smoking history. She quit smokeless tobacco use about 20 years ago. Her smokeless tobacco use included Chew. She reports that she does not drink alcohol or use illicit drugs.  family history includes Asthma in her sister; Colon cancer in her father; Diabetes in her mother; Heart disease in her father, mother, and sister; Hypertension in her mother; and Stomach cancer in her sister.    Current Medications, Allergies  were all reviewed with the patient via [facility] electronic medical record system.    Physical Exam: BP 138/76  Pulse 64  Ht 4\' 11"  (1.499 m)  Wt 224 lb (101.606 kg)  BMI 45.24 kg/m2 Constitutional: generally well-appearing except for morbid obesity Psychiatric: alert and oriented x3 Eyes: extraocular movements intact Mouth: oral pharynx moist, no lesions Neck: supple no lymphadenopathy Cardiovascular: heart regular rate and rhythm: cabg scar Lungs: clear to auscultation bilaterally Abdomen: soft, nontender, nondistended, no obvious ascites, no peritoneal signs, normal bowel sounds Extremities: no lower extremity edema bilaterally Skin: no lesions on visible extremities Rectal examination with female assistance in room  found a small, nonthrombosed external anal hemorrhoids   Assessment and plan: 60 y.o. female with minor intermittent rectal bleeding from hemorrhoids  I reassured her that her minor red rectal bleeding is likely from hemorrhoids. She does tend to be constipated and has to push and strain and so I have asked her to start fiber supplements daily. She had a colonoscopy 2 years ago and that does not need to be repeated now.

## 2011-03-10 NOTE — Patient Instructions (Addendum)
We will get records sent from [facility] from Dr. Noe Gens (EGD, any esophageal testing, recent office notes) so that we do not order any unnecessary duplicate tests. Please start taking citrucel (orange flavored) powder fiber supplement.  This may cause some bloating at first but that usually goes away. Begin with a small spoonful and work your way up to a large, heaping spoonful daily over a week. Sitz baths twice daily. You are already set up for repeat colonoscopy for polyp surveillance in 2015 (February)     Sitz Bath A sitz bath is a warm water bath taken in the sitting position that covers only the hips and buttocks. It may be used for either healing or hygiene purposes. The water may contain medication. Sitz baths are often used to relieve pain, itching or muscle spasms. Moist heat will help you heal and relax. Follow these steps carefully: HOME CARE INSTRUCTIONS  Fill the bathtub half full with warm water. Not Too Hot!   Sit in the water and open the drain a little.   Turn on the warm water to keep the tub half full. Keep the water running constantly.   Soak in the water for 15 to 20 minutes.   After the sitz bath, blot dry the affected part first.   Take 3 to 4 sitz baths a day.  SEEK MEDICAL CARE IF: You get worse instead of better; stop the sitz baths. Document Released: 05/23/2004 Document Re-Released: 09/22/2009 [facility] Patient Information 2011 [place], [place].

## 2011-03-13 ENCOUNTER — Telehealth: Payer: Self-pay | Admitting: Gastroenterology

## 2011-03-13 NOTE — Telephone Encounter (Signed)
[name] per Dr Noe Gens nurse Angie the pt was treated in July 2011 for H pylori with Amox 500 2 po bid x 14 days and biaxin 500 1 po qd x 14 days.

## 2011-03-13 NOTE — Telephone Encounter (Signed)
EGD [place], Dr. Noe Gens, 1/11: ?4cm Barretts, s/p gastric bypass anatomy, 7mm antral ulcer; biopsies showed NO Barrett's, + H. Pylori EGD [place], Dr. Noe Gens, 7/11; 7mm persistent  Antral ulcer, s/p gastric bypass anatomy, "gastric mucosa @ distal 3cm of esophagus:" +/- for Barrett's changes, + h pylori  Colonoscopy Dr. Noe Gens, 10/2009: 2 8mm polyps removed; one was serrated adenoma, the other [place]; recommended to have repeat in 1 year due to poor prep.  I did colonoscopy for her 1 year prior, good prep, removed one TA, already in for recall in 2015 which is a better interval than Dr. Noe Gens'    I cannot tell if she was ever treated for H. Pylori by any of Dr. Noe Gens records.  Patty, can you call to check if they have any record of ever treating her H. Pylori ulcer noted in 2011 with antibiotics.

## 2011-03-27 ENCOUNTER — Encounter: Payer: Self-pay | Admitting: Pulmonary Disease

## 2011-03-27 ENCOUNTER — Ambulatory Visit (INDEPENDENT_AMBULATORY_CARE_PROVIDER_SITE_OTHER): Payer: Medicaid Other | Admitting: Pulmonary Disease

## 2011-03-27 VITALS — BP 110/70 | HR 70 | Temp 97.8°F | Ht 59.0 in | Wt 224.8 lb

## 2011-03-27 DIAGNOSIS — G4733 Obstructive sleep apnea (adult) (pediatric): Secondary | ICD-10-CM

## 2011-03-27 NOTE — Progress Notes (Signed)
  Subjective:    Patient ID: Patricia Cuevas, female    DOB: [birth year], 60 y.o.   MRN: [id number]  HPI The pt comes in today for f/u of her known moderate osa.  She was started on cpap last visit, and has done well with the device.  She has only been wearing 4 hrs a night because she thought that was her limit per dme, but I explained that is the minimum under medicare guidelines.  She wishes to wear all night.  She denies any issues with mask or pressure, and feels she is sleeping much better with improved daytime alertness.    Review of Systems  Constitutional: Negative for fever and unexpected weight change.  HENT: Negative for ear pain, nosebleeds, congestion, sore throat, rhinorrhea, sneezing, trouble swallowing, dental problem, postnasal drip and sinus pressure.   Eyes: Negative for redness and itching.  Respiratory: Negative for cough, chest tightness, shortness of breath and wheezing.   Cardiovascular: Negative for palpitations and leg swelling.  Gastrointestinal: Negative for nausea and vomiting.  Genitourinary: Negative for dysuria.  Musculoskeletal: Negative for joint swelling.  Skin: Negative for rash.  Neurological: Positive for headaches.  Hematological: Does not bruise/bleed easily.  Psychiatric/Behavioral: Negative for dysphoric mood. The patient is not nervous/anxious.        Objective:   Physical Exam Ow female in nad Nares without discharge, no skin breakdown or pressure necrosis from cpap mask Chest clear LE with mild edema, no cyanosis noted. Appears alert, not sleepy, moves all 4        Assessment & Plan:

## 2011-03-27 NOTE — Patient Instructions (Signed)
Continue on cpap.  Wear the whole night that you are sleeping Will get your pressure adjusted, and let you know the results Work on weight loss followup with me in 6mos.

## 2011-03-30 ENCOUNTER — Telehealth: Payer: Self-pay | Admitting: Pulmonary Disease

## 2011-03-30 NOTE — Telephone Encounter (Signed)
Pt says she is waking up every morning with a dry mouth and  for the past 2 mornings she has been coughing up yellow mucus in the mornings and feels like she has water in her ears. She does not have any trouble with CPAP pressure. Pls advise.

## 2011-03-30 NOTE — Telephone Encounter (Signed)
Dry mouth is typically caused by mouth opening if she has a nasal mask.  If she has a full face mask, more than likely due to a lack of humidity.  She needs to turn the heat up on her humidifier to get more moisture to see if that will help.  If she has nasal mask, may have to try a full face or add a chin strap.  She can also use nasal saline spray each night before putting on cpap to help moisturize and clean out nose.

## 2011-03-30 NOTE — Telephone Encounter (Signed)
ATC NA and no option given to leave a msg, WCB on 03/31/11

## 2011-03-31 NOTE — Assessment & Plan Note (Signed)
The pt is doing well with cpap, and denies any pressure or mask issues.  She now understands she is to wear the whole night.  We need to optimize her pressure, and have encouraged her to work on weight loss.  Care Plan:  At this point, will arrange for the patient's machine to be changed over to auto mode for 2 weeks to optimize their pressure.  I will review the downloaded data once sent by dme, and also evaluate for compliance, leaks, and residual osa.  I will call the patient and dme to discuss the results, and have the patient's machine set appropriately.  This will serve as the pt's cpap pressure titration.

## 2011-03-31 NOTE — Telephone Encounter (Signed)
Spoke with the pt and she states she has the full face mask, so I advised her to turn up the humidifier. She states she went to her PCP and she was diagnosed with a sinus infection so she thinks this has something to do with it as well. I advised to follow pcp recs for infection and to increase humidity and if no relief to let us know. Carron Curie, CMA

## 2011-04-27 ENCOUNTER — Emergency Department (HOSPITAL_COMMUNITY): Payer: Medicaid Other

## 2011-04-27 ENCOUNTER — Emergency Department (HOSPITAL_COMMUNITY)
Admission: EM | Admit: 2011-04-27 | Discharge: 2011-04-27 | Disposition: A | Discharge status: Home / Self Care | Payer: Medicaid Other | Attending: Emergency Medicine | Admitting: Emergency Medicine

## 2011-04-27 DIAGNOSIS — E119 Type 2 diabetes mellitus without complications: Secondary | ICD-10-CM | POA: Insufficient documentation

## 2011-04-27 DIAGNOSIS — R42 Dizziness and giddiness: Secondary | ICD-10-CM | POA: Insufficient documentation

## 2011-04-27 DIAGNOSIS — R059 Cough, unspecified: Secondary | ICD-10-CM | POA: Insufficient documentation

## 2011-04-27 DIAGNOSIS — I10 Essential (primary) hypertension: Secondary | ICD-10-CM | POA: Insufficient documentation

## 2011-04-27 DIAGNOSIS — R51 Headache: Secondary | ICD-10-CM | POA: Insufficient documentation

## 2011-04-27 DIAGNOSIS — I2510 Atherosclerotic heart disease of native coronary artery without angina pectoris: Secondary | ICD-10-CM | POA: Insufficient documentation

## 2011-04-27 DIAGNOSIS — J45909 Unspecified asthma, uncomplicated: Secondary | ICD-10-CM | POA: Insufficient documentation

## 2011-04-27 DIAGNOSIS — R05 Cough: Secondary | ICD-10-CM | POA: Insufficient documentation

## 2011-04-27 DIAGNOSIS — J3489 Other specified disorders of nose and nasal sinuses: Secondary | ICD-10-CM | POA: Insufficient documentation

## 2011-04-27 LAB — CBC
Hemoglobin: 12.8 g/dL (ref 12.0–15.0)
RBC: 4.53 MIL/uL (ref 3.87–5.11)

## 2011-04-27 LAB — POCT I-STAT, CHEM 8
Calcium, Ion: 1.23 mmol/L (ref 1.12–1.32)
Creatinine, Ser: 0.7 mg/dL (ref 0.50–1.10)
Hemoglobin: 13.9 g/dL (ref 12.0–15.0)
Sodium: 139 mEq/L (ref 135–145)
TCO2: 25 mmol/L (ref 0–100)

## 2011-04-27 LAB — DIFFERENTIAL
Basophils Absolute: 0 10*3/uL (ref 0.0–0.1)
Basophils Relative: 0 % (ref 0–1)
Neutro Abs: 1.8 10*3/uL (ref 1.7–7.7)
Neutrophils Relative %: 38 % — ABNORMAL LOW (ref 43–77)

## 2011-05-21 ENCOUNTER — Emergency Department (HOSPITAL_COMMUNITY)
Admission: EM | Admit: 2011-05-21 | Discharge: 2011-05-21 | Disposition: A | Discharge status: Home / Self Care | Payer: Medicaid Other | Attending: Emergency Medicine | Admitting: Emergency Medicine

## 2011-05-21 ENCOUNTER — Emergency Department (HOSPITAL_COMMUNITY): Payer: Medicaid Other

## 2011-05-21 DIAGNOSIS — R141 Gas pain: Secondary | ICD-10-CM | POA: Insufficient documentation

## 2011-05-21 DIAGNOSIS — J45909 Unspecified asthma, uncomplicated: Secondary | ICD-10-CM | POA: Insufficient documentation

## 2011-05-21 DIAGNOSIS — N390 Urinary tract infection, site not specified: Secondary | ICD-10-CM | POA: Insufficient documentation

## 2011-05-21 DIAGNOSIS — E119 Type 2 diabetes mellitus without complications: Secondary | ICD-10-CM | POA: Insufficient documentation

## 2011-05-21 DIAGNOSIS — I10 Essential (primary) hypertension: Secondary | ICD-10-CM | POA: Insufficient documentation

## 2011-05-21 DIAGNOSIS — R142 Eructation: Secondary | ICD-10-CM | POA: Insufficient documentation

## 2011-05-21 DIAGNOSIS — Z853 Personal history of malignant neoplasm of breast: Secondary | ICD-10-CM | POA: Insufficient documentation

## 2011-05-21 DIAGNOSIS — R109 Unspecified abdominal pain: Secondary | ICD-10-CM | POA: Insufficient documentation

## 2011-05-21 DIAGNOSIS — M545 Low back pain, unspecified: Secondary | ICD-10-CM | POA: Insufficient documentation

## 2011-05-21 DIAGNOSIS — I2510 Atherosclerotic heart disease of native coronary artery without angina pectoris: Secondary | ICD-10-CM | POA: Insufficient documentation

## 2011-05-21 DIAGNOSIS — M129 Arthropathy, unspecified: Secondary | ICD-10-CM | POA: Insufficient documentation

## 2011-05-21 DIAGNOSIS — Z79899 Other long term (current) drug therapy: Secondary | ICD-10-CM | POA: Insufficient documentation

## 2011-05-21 DIAGNOSIS — R112 Nausea with vomiting, unspecified: Secondary | ICD-10-CM | POA: Insufficient documentation

## 2011-05-21 DIAGNOSIS — Z951 Presence of aortocoronary bypass graft: Secondary | ICD-10-CM | POA: Insufficient documentation

## 2011-05-21 DIAGNOSIS — R10819 Abdominal tenderness, unspecified site: Secondary | ICD-10-CM | POA: Insufficient documentation

## 2011-05-21 LAB — POCT I-STAT, CHEM 8
Calcium, Ion: 1.15 mmol/L (ref 1.12–1.32)
Glucose, Bld: 181 mg/dL — ABNORMAL HIGH (ref 70–99)
HCT: 43 % (ref 36.0–46.0)
Hemoglobin: 14.6 g/dL (ref 12.0–15.0)

## 2011-05-21 LAB — LACTIC ACID, PLASMA: Lactic Acid, Venous: 1.4 mmol/L (ref 0.5–2.2)

## 2011-05-21 LAB — URINALYSIS, ROUTINE W REFLEX MICROSCOPIC
Hgb urine dipstick: NEGATIVE
Protein, ur: NEGATIVE mg/dL
Urobilinogen, UA: 1 mg/dL (ref 0.0–1.0)

## 2011-05-21 LAB — DIFFERENTIAL
Basophils Absolute: 0 10*3/uL (ref 0.0–0.1)
Basophils Relative: 0 % (ref 0–1)
Eosinophils Absolute: 0 10*3/uL (ref 0.0–0.7)
Eosinophils Relative: 1 % (ref 0–5)

## 2011-05-21 LAB — CBC
Platelets: 200 10*3/uL (ref 150–400)
RDW: 14.9 % (ref 11.5–15.5)
WBC: 6.6 10*3/uL (ref 4.0–10.5)

## 2011-05-21 LAB — URINE MICROSCOPIC-ADD ON

## 2011-05-21 MED ORDER — IOHEXOL 300 MG/ML  SOLN
100.0000 mL | Freq: Once | INTRAMUSCULAR | Status: AC | PRN
  Administered 2011-05-21: 100 mL via INTRAVENOUS

## 2011-05-22 LAB — URINE CULTURE: Colony Count: >=100000

## 2011-05-24 ENCOUNTER — Other Ambulatory Visit: Payer: Self-pay | Admitting: Pulmonary Disease

## 2011-05-24 DIAGNOSIS — G4733 Obstructive sleep apnea (adult) (pediatric): Secondary | ICD-10-CM

## 2011-06-09 LAB — URINE MICROSCOPIC-ADD ON

## 2011-06-09 LAB — URINALYSIS, ROUTINE W REFLEX MICROSCOPIC
Glucose, UA: 250 — AB
Nitrite: NEGATIVE
Protein, ur: NEGATIVE
pH: 5.5

## 2011-06-09 LAB — DIFFERENTIAL
Basophils Absolute: 0.1
Eosinophils Absolute: 0.1
Lymphocytes Relative: 49 — ABNORMAL HIGH
Monocytes Relative: 11
Neutro Abs: 2.5
Neutrophils Relative %: 38 — ABNORMAL LOW

## 2011-06-09 LAB — POCT I-STAT, CHEM 8
BUN: 9
Calcium, Ion: 1.18
Sodium: 139
TCO2: 24

## 2011-06-09 LAB — URINE CULTURE
Colony Count: NO GROWTH
Culture: NO GROWTH

## 2011-06-09 LAB — CBC
Hemoglobin: 12.2
RDW: 16.1 — ABNORMAL HIGH

## 2011-06-11 LAB — POCT I-STAT, CHEM 8
Chloride: 104
Creatinine, Ser: 0.8
Glucose, Bld: 91
Hemoglobin: 13.6
Potassium: 4.1

## 2011-06-11 LAB — POCT CARDIAC MARKERS
CKMB, poc: 3.2
Troponin i, poc: <0.05

## 2011-06-11 LAB — URINE MICROSCOPIC-ADD ON

## 2011-06-11 LAB — DIFFERENTIAL
Eosinophils Absolute: 0
Lymphs Abs: 2.7
Monocytes Relative: 8
Neutrophils Relative %: 44

## 2011-06-11 LAB — CBC
MCV: 83.6
RBC: 4.47
WBC: 6.1

## 2011-06-11 LAB — URINALYSIS, ROUTINE W REFLEX MICROSCOPIC
Glucose, UA: NEGATIVE
Hgb urine dipstick: NEGATIVE
Nitrite: POSITIVE — AB
Specific Gravity, Urine: 1.026
pH: 5.5

## 2011-06-11 LAB — URINE CULTURE: Colony Count: 80000

## 2011-06-16 LAB — URINALYSIS, ROUTINE W REFLEX MICROSCOPIC
Glucose, UA: NEGATIVE
Ketones, ur: NEGATIVE
pH: 7

## 2011-06-16 LAB — URINE CULTURE: Colony Count: >=100000

## 2011-06-16 LAB — URINE MICROSCOPIC-ADD ON

## 2011-06-23 LAB — I-STAT 8, (EC8 V) (CONVERTED LAB)
Acid-Base Excess: 2
BUN: 9
Bicarbonate: 29.9 — ABNORMAL HIGH
HCT: 43
Hemoglobin: 14.6
Operator id: 151321
Sodium: 141
TCO2: 32
pCO2, Ven: 57.4 — ABNORMAL HIGH

## 2011-06-23 LAB — URINALYSIS, ROUTINE W REFLEX MICROSCOPIC
Bilirubin Urine: NEGATIVE
Glucose, UA: NEGATIVE
Hgb urine dipstick: NEGATIVE
Ketones, ur: NEGATIVE
Nitrite: NEGATIVE
Protein, ur: NEGATIVE
Specific Gravity, Urine: 1.024
Urobilinogen, UA: 1
pH: 7

## 2011-06-23 LAB — CBC
HCT: 39
Hemoglobin: 12.9
MCHC: 32.9
MCV: 83.4
Platelets: 254
RBC: 4.68
RDW: 16.5 — ABNORMAL HIGH
WBC: 7

## 2011-06-23 LAB — DIFFERENTIAL
Basophils Absolute: 0
Basophils Relative: 1
Eosinophils Absolute: 0
Eosinophils Relative: 1
Lymphocytes Relative: 48 — ABNORMAL HIGH
Lymphs Abs: 3.4 — ABNORMAL HIGH
Monocytes Absolute: 0.5
Monocytes Relative: 7
Neutro Abs: 3.1
Neutrophils Relative %: 44

## 2011-06-23 LAB — HEPATIC FUNCTION PANEL
ALT: 19
AST: 32
Alkaline Phosphatase: 103
Indirect Bilirubin: 0.9
Total Protein: 6.5

## 2011-06-23 LAB — POCT CARDIAC MARKERS
CKMB, poc: 3.9
Troponin i, poc: <0.05

## 2011-06-23 LAB — URINE MICROSCOPIC-ADD ON

## 2011-07-16 ENCOUNTER — Encounter: Payer: Self-pay | Admitting: Cardiology

## 2011-07-16 ENCOUNTER — Ambulatory Visit (INDEPENDENT_AMBULATORY_CARE_PROVIDER_SITE_OTHER): Payer: Medicaid Other | Admitting: Cardiology

## 2011-07-16 DIAGNOSIS — I10 Essential (primary) hypertension: Secondary | ICD-10-CM

## 2011-07-16 DIAGNOSIS — E663 Overweight: Secondary | ICD-10-CM

## 2011-07-16 DIAGNOSIS — I2510 Atherosclerotic heart disease of native coronary artery without angina pectoris: Secondary | ICD-10-CM

## 2011-07-16 NOTE — Assessment & Plan Note (Signed)
Blood pressure is controlled. No change in therapy. 

## 2011-07-16 NOTE — Patient Instructions (Signed)
Your physician wants you to follow-up in:  12 months.  You will receive a reminder letter in the mail two months in advance. If you don't receive a letter, please call our office to schedule the follow-up appointment.   

## 2011-07-16 NOTE — Assessment & Plan Note (Signed)
Continued attempts for weight loss are still important for her.

## 2011-07-16 NOTE — Progress Notes (Signed)
HPI Patient is seen for cardiology followup.  He is doing well.  She is not having any significant chest pain.  I saw her last in June, 2012.  She underwent CABG in 2007.  Her last catheter was in November, 2011.  Medical therapy was recommended.  A nuclear scan had been done in May, 2012 showing no significant ischemia or scar. No Known Allergies  Current Outpatient Prescriptions  Medication Sig Dispense Refill  . albuterol (PROVENTIL) (2.5 MG/3ML) 0.083% nebulizer solution Take 2.5 mg by nebulization every 6 (six) hours as needed.        [name] Kitchen aspirin 81 MG tablet Take 81 mg by mouth daily.        . furosemide (LASIX) 40 MG tablet Take 40 mg by mouth daily.        [name] Kitchen HYDROcodone-acetaminophen (NORCO) 10-325 MG per tablet Take 1 tablet by mouth every 6 (six) hours as needed.        . isosorbide mononitrate (IMDUR) 60 MG 24 hr tablet Take 60 mg by mouth daily.        [name] Kitchen lisinopril-hydrochlorothiazide (PRINZIDE,ZESTORETIC) 20-12.5 MG per tablet Take 1 tablet by mouth daily.        . metFORMIN (GLUMETZA) 500 MG (MOD) 24 hr tablet Take 500 mg by mouth daily with breakfast.        . metoprolol tartrate (LOPRESSOR) 25 MG tablet Take 25 mg by mouth 2 (two) times daily.        [name] Kitchen atorvastatin (LIPITOR) 80 MG tablet Take 80 mg by mouth daily.          History   Social History  . Marital Status: Single    Spouse Name: N/A    Number of Children: N/A  . Years of Education: N/A   Occupational History  . Disabled    Social History Main Topics  . Smoking status: Former Smoker -- 3.0 packs/day for 27 years    Types: Cigarettes    Quit date: 09/14/2000  . Smokeless tobacco: Former Neurosurgeon    Types: Chew    Quit date: 09/14/1990  . Alcohol Use: No  . Drug Use: No  . Sexually Active: Not on file   Other Topics Concern  . Not on file   Social History Narrative  . No narrative on file    Family History  Problem Relation Age of Onset  . Hypertension Mother   . Heart disease Mother   . Diabetes  Mother   . Heart disease Father   . Heart disease Sister   . Stomach cancer Sister   . Asthma Sister   . Colon cancer Father     Past Medical History  Diagnosis Date  . Hx of CABG     2007  . Breast cancer     Double mastectomy 2007  . Hypertension   . Diabetes mellitus   . Ejection fraction     EF 65%, echo, [place], Jan 16, 2011  . Sleep apnea     CPAP being arranged May, 2011  . Dyslipidemia   . CAD (coronary artery disease)     Nuclear stress test Feb 11, 2011, EF 64%, no scar or ischemia    //         catheterization, November, 2011,patent LIMA-LAD-small caliber distal vessel with diffuse plaque, patent SVG to OM1 and OM 2, patent SVG to PDA and PLA, occluded SVG to diagonal, medical therapy  . Overweight     stomach stapling 1985 followed by  weight loss, then return of weight  . Tobacco abuse     in the past, resolved  . Alcohol abuse     in the past, resolved  . Nausea & vomiting     hospitalization, November, 2011, stricture GE junction, questionable stenosis gastrojejunostomy, disruptive primary peristaltic wave, GE reflux, delayed emptying proximal gastric pouch  . Hx of colonic polyps     Past Surgical History  Procedure Date  . Coronary artery bypass graft   . Mastectomy   . Coronary stent placement   . Abdominal hysterectomy     ROS  Patient denies fever, chills, headache, sweats, rash, change in vision, change in hearing, chest pain, cough, nausea vomiting, urinary symptoms.  All other systems are reviewed and are negative. PHYSICAL EXAM Patient is oriented to person time and place.  Affect is normal.  She is here with a friend/family member.  There is no jugular venous distention.  She is overweight.  Lungs are clear.  Respiratory effort is nonlabored.  Cardiac exam reveals S1 and S2.  There are no clicks or significant murmurs.  Abdomen is soft there is no peripheral edema. Filed Vitals:   07/16/11 1010  BP: 128/76  Pulse: 70  Height: 4\' 11"  (1.499  m)  Weight: 222 lb (100.699 kg)    EKG is not done today.  ASSESSMENT & PLAN

## 2011-07-16 NOTE — Assessment & Plan Note (Signed)
Coronary disease is stable.  No further workup is needed. 

## 2011-09-23 ENCOUNTER — Encounter (HOSPITAL_COMMUNITY): Payer: Self-pay | Admitting: Emergency Medicine

## 2011-09-23 ENCOUNTER — Emergency Department (HOSPITAL_COMMUNITY): Payer: Medicaid Other

## 2011-09-23 ENCOUNTER — Emergency Department (HOSPITAL_COMMUNITY)
Admission: EM | Admit: 2011-09-23 | Discharge: 2011-09-24 | Disposition: A | Discharge status: Home / Self Care | Payer: Medicaid Other | Attending: Emergency Medicine | Admitting: Emergency Medicine

## 2011-09-23 DIAGNOSIS — Z853 Personal history of malignant neoplasm of breast: Secondary | ICD-10-CM | POA: Insufficient documentation

## 2011-09-23 DIAGNOSIS — J111 Influenza due to unidentified influenza virus with other respiratory manifestations: Secondary | ICD-10-CM | POA: Insufficient documentation

## 2011-09-23 DIAGNOSIS — E785 Hyperlipidemia, unspecified: Secondary | ICD-10-CM | POA: Insufficient documentation

## 2011-09-23 DIAGNOSIS — Z7982 Long term (current) use of aspirin: Secondary | ICD-10-CM | POA: Insufficient documentation

## 2011-09-23 DIAGNOSIS — R509 Fever, unspecified: Secondary | ICD-10-CM | POA: Insufficient documentation

## 2011-09-23 DIAGNOSIS — I2510 Atherosclerotic heart disease of native coronary artery without angina pectoris: Secondary | ICD-10-CM | POA: Insufficient documentation

## 2011-09-23 DIAGNOSIS — R070 Pain in throat: Secondary | ICD-10-CM | POA: Insufficient documentation

## 2011-09-23 DIAGNOSIS — R5383 Other fatigue: Secondary | ICD-10-CM | POA: Insufficient documentation

## 2011-09-23 DIAGNOSIS — R05 Cough: Secondary | ICD-10-CM | POA: Insufficient documentation

## 2011-09-23 DIAGNOSIS — R5381 Other malaise: Secondary | ICD-10-CM | POA: Insufficient documentation

## 2011-09-23 DIAGNOSIS — R059 Cough, unspecified: Secondary | ICD-10-CM | POA: Insufficient documentation

## 2011-09-23 DIAGNOSIS — Z79899 Other long term (current) drug therapy: Secondary | ICD-10-CM | POA: Insufficient documentation

## 2011-09-23 DIAGNOSIS — J3489 Other specified disorders of nose and nasal sinuses: Secondary | ICD-10-CM | POA: Insufficient documentation

## 2011-09-23 DIAGNOSIS — R51 Headache: Secondary | ICD-10-CM | POA: Insufficient documentation

## 2011-09-23 DIAGNOSIS — E119 Type 2 diabetes mellitus without complications: Secondary | ICD-10-CM | POA: Insufficient documentation

## 2011-09-23 DIAGNOSIS — IMO0001 Reserved for inherently not codable concepts without codable children: Secondary | ICD-10-CM | POA: Insufficient documentation

## 2011-09-23 DIAGNOSIS — I10 Essential (primary) hypertension: Secondary | ICD-10-CM | POA: Insufficient documentation

## 2011-09-23 MED ORDER — ONDANSETRON HCL 4 MG PO TABS: 4.0000 mg | ORAL_TABLET | Freq: Four times a day (QID) | ORAL | Status: AC

## 2011-09-23 NOTE — ED Provider Notes (Signed)
History     CSN: [id number]  Arrival date & time 09/23/11  4098   First MD Initiated Contact with Patient 09/23/11 2150      Chief Complaint  Patient presents with  . Generalized Body Aches    (Consider location/radiation/quality/duration/timing/severity/associated sxs/prior treatment) HPI Comments: Patient reports she's had myalgias, low-grade fever, headache, sore throat, cough for the past week was seen by her primary care physician was given a Z-Pak for sinusitis, but nothing for her myalgias and was told she most likely had the flu, or she's not had her immunization this year.  She's just not feeling well.  Still having cough, myalgias, when she does take Tylenol or Motrin.  Her fever does resolve and she feels marginally better  The history is provided by the patient.    Past Medical History  Diagnosis Date  . Hx of CABG     2007  . Breast cancer     Double mastectomy 2007  . Hypertension   . Diabetes mellitus   . Ejection fraction     EF 65%, echo, [place], Jan 16, 2011  . Sleep apnea     CPAP being arranged May, 2011  . Dyslipidemia   . CAD (coronary artery disease)     Nuclear stress test Feb 11, 2011, EF 64%, no scar or ischemia    //         catheterization, November, 2011,patent LIMA-LAD-small caliber distal vessel with diffuse plaque, patent SVG to OM1 and OM 2, patent SVG to PDA and PLA, occluded SVG to diagonal, medical therapy  . Overweight     stomach stapling 1985 followed by weight loss, then return of weight  . Tobacco abuse     in the past, resolved  . Alcohol abuse     in the past, resolved  . Nausea & vomiting     hospitalization, November, 2011, stricture GE junction, questionable stenosis gastrojejunostomy, disruptive primary peristaltic wave, GE reflux, delayed emptying proximal gastric pouch  . Hx of colonic polyps     Past Surgical History  Procedure Date  . Coronary artery bypass graft   . Mastectomy   . Coronary stent placement   .  Abdominal hysterectomy     Family History  Problem Relation Age of Onset  . Hypertension Mother   . Heart disease Mother   . Diabetes Mother   . Heart disease Father   . Heart disease Sister   . Stomach cancer Sister   . Asthma Sister   . Colon cancer Father     History  Substance Use Topics  . Smoking status: Former Smoker -- 3.0 packs/day for 27 years    Types: Cigarettes    Quit date: 09/14/2000  . Smokeless tobacco: Former Neurosurgeon    Types: Chew    Quit date: 09/14/1990  . Alcohol Use: No    OB History    Grav Para Term Preterm Abortions TAB SAB Ect Mult Living                  Review of Systems  Constitutional: Positive for fever, chills and fatigue.  HENT: Positive for rhinorrhea and sinus pressure.   Respiratory: Positive for cough.   Cardiovascular: Negative for chest pain.  Gastrointestinal: Negative for nausea.  Genitourinary: Negative for dysuria.  Musculoskeletal: Positive for myalgias.  Neurological: Negative for dizziness, weakness and numbness.  Hematological: Negative.   Psychiatric/Behavioral: Negative.     Allergies  Review of patient's allergies indicates no known  allergies.  Home Medications   Current Outpatient Rx  Name Route Sig Dispense Refill  . ALBUTEROL SULFATE (2.5 MG/3ML) 0.083% IN NEBU Nebulization Take 2.5 mg by nebulization every 6 (six) hours as needed. Shortness of breath    . ASPIRIN 81 MG PO TABS Oral Take 81 mg by mouth daily.      . ATORVASTATIN CALCIUM 80 MG PO TABS Oral Take 80 mg by mouth daily.      . FUROSEMIDE 40 MG PO TABS Oral Take 40 mg by mouth daily.      [name] Kitchen HYDROCODONE-ACETAMINOPHEN 10-325 MG PO TABS Oral Take 1 tablet by mouth every 6 (six) hours as needed. For pain    . ISOSORBIDE MONONITRATE ER 60 MG PO TB24 Oral Take 60 mg by mouth daily.      [name] Kitchen LISINOPRIL-HYDROCHLOROTHIAZIDE 20-12.5 MG PO TABS Oral Take 1 tablet by mouth daily.      [name] Kitchen METFORMIN HCL ER (MOD) 1000 MG PO TB24 Oral Take 1,000 mg by mouth daily  with breakfast.    . METOPROLOL TARTRATE 25 MG PO TABS Oral Take 25 mg by mouth 2 (two) times daily.      [name] Kitchen NITROGLYCERIN 0.4 MG SL SUBL Sublingual Place 0.4 mg under the tongue every 5 (five) minutes as needed. For chest pain    . ONDANSETRON HCL 4 MG PO TABS Oral Take 1 tablet (4 mg total) by mouth every 6 (six) hours. 12 tablet 0    BP 133/78  Pulse 84  Temp(Src) 98.3 F (36.8 C) (Oral)  Resp 17  SpO2 97%  Physical Exam  Constitutional: She is oriented to person, place, and time. She appears well-developed.  HENT:  Head: Normocephalic.  Eyes: Pupils are equal, round, and reactive to light.  Neck: Normal range of motion.  Cardiovascular: Normal rate.   Pulmonary/Chest: No respiratory distress. She has no wheezes. She exhibits no tenderness.  Abdominal: Bowel sounds are normal.  Musculoskeletal: Normal range of motion. She exhibits no edema and no tenderness.  Neurological: She is alert and oriented to person, place, and time.  Skin: Skin is warm and dry.    ED Course  Procedures (including critical care time)  Labs Reviewed - No data to display Dg Chest 2 View  09/23/2011  *RADIOLOGY REPORT*  Clinical Data: Cough.  Short of breath.  Fever.  CHEST - 2 VIEW  Comparison: 07/21/2010.  Findings: Median sternotomy.  Right axillary dissection.  Surgical staples near the stomach; question gastric bypass.  Chronic elevation of the right hemidiaphragm.  Left basilar atelectasis. No airspace disease, effusion or consolidation. Right AC joint osteoarthritis.  IMPRESSION: Chronic changes of the chest and abdomen without acute cardiopulmonary disease.  Original Report Authenticated By: Andreas Newport, M.D.     1. Influenza      Will obtain a chest x-ray just to verify that there is no complicating pneumonia from her influenza MDM  Is likely, seasonal influenza        Arman Filter, NP 09/23/11 2259  Arman Filter, NP 09/23/11 2352

## 2011-09-23 NOTE — ED Notes (Signed)
Pt st's she was dx with Flu by her MD last week.  Was feeling better till yesterday, starting aching all over again.

## 2011-09-23 NOTE — ED Notes (Signed)
Diagnosed with flu last week and given zpak. C/o increased cough and body aches beginning yesterday. Also assoc with hot and cold chills. Pt in nad.

## 2011-09-23 NOTE — ED Notes (Signed)
Patient transported to X-ray 

## 2011-09-24 NOTE — ED Provider Notes (Signed)
Medical screening examination/treatment/procedure(s) were performed by non-physician practitioner and as supervising physician I was immediately available for consultation/collaboration.  Jaydynn Wolford L Amiyrah Lamere, MD 09/24/11 1656 

## 2011-10-02 ENCOUNTER — Telehealth: Payer: Self-pay | Admitting: Oncology

## 2011-10-02 NOTE — Telephone Encounter (Signed)
called pt and informed her that her appt for feb was moved to 11/13/2011

## 2011-10-27 ENCOUNTER — Telehealth: Payer: Self-pay | Admitting: Oncology

## 2011-10-27 NOTE — Telephone Encounter (Signed)
called pt and informed her that we moved her appt on 03/01 to 03/21

## 2011-11-13 ENCOUNTER — Other Ambulatory Visit: Payer: Medicaid Other | Admitting: Lab

## 2011-11-13 ENCOUNTER — Ambulatory Visit: Payer: Medicaid Other | Admitting: Oncology

## 2011-12-03 ENCOUNTER — Other Ambulatory Visit (HOSPITAL_BASED_OUTPATIENT_CLINIC_OR_DEPARTMENT_OTHER): Payer: Medicaid Other | Admitting: Lab

## 2011-12-03 ENCOUNTER — Ambulatory Visit (HOSPITAL_BASED_OUTPATIENT_CLINIC_OR_DEPARTMENT_OTHER): Payer: Medicaid Other | Admitting: Physician Assistant

## 2011-12-03 ENCOUNTER — Other Ambulatory Visit: Payer: Medicaid Other | Admitting: Lab

## 2011-12-03 ENCOUNTER — Ambulatory Visit: Payer: Medicaid Other | Admitting: Physician Assistant

## 2011-12-03 VITALS — BP 126/73 | HR 80 | Temp 97.1°F | Ht 59.0 in | Wt 224.1 lb

## 2011-12-03 DIAGNOSIS — C50919 Malignant neoplasm of unspecified site of unspecified female breast: Secondary | ICD-10-CM

## 2011-12-03 DIAGNOSIS — M949 Disorder of cartilage, unspecified: Secondary | ICD-10-CM

## 2011-12-03 DIAGNOSIS — Z800 Family history of malignant neoplasm of digestive organs: Secondary | ICD-10-CM

## 2011-12-03 LAB — COMPREHENSIVE METABOLIC PANEL
ALT: 26 U/L (ref 0–35)
AST: 23 U/L (ref 0–37)
CO2: 29 mEq/L (ref 19–32)
Chloride: 105 mEq/L (ref 96–112)
Sodium: 142 mEq/L (ref 135–145)
Total Bilirubin: 0.4 mg/dL (ref 0.3–1.2)
Total Protein: 6.3 g/dL (ref 6.0–8.3)

## 2011-12-03 LAB — CBC WITH DIFFERENTIAL/PLATELET
BASO%: 0.9 % (ref 0.0–2.0)
EOS%: 1.2 % (ref 0.0–7.0)
LYMPH%: 46.8 % (ref 14.0–49.7)
MCH: 28.6 pg (ref 25.1–34.0)
MCHC: 33.2 g/dL (ref 31.5–36.0)
MONO#: 0.7 10*3/uL (ref 0.1–0.9)
RBC: 4.63 10*6/uL (ref 3.70–5.45)
WBC: 7.7 10*3/uL (ref 3.9–10.3)
lymph#: 3.6 10*3/uL — ABNORMAL HIGH (ref 0.9–3.3)

## 2011-12-03 LAB — LACTATE DEHYDROGENASE: LDH: 207 U/L (ref 94–250)

## 2011-12-03 NOTE — Progress Notes (Signed)
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  Since her last clinic visit in February 2012, she had  URI requiring Zpack  With resolution of symptoms. She also had Klebsiella in urine in 02/2011, treated with antibiotics. Currently, she reports some intermittent mild fatigue, but no difficulty completing ADLs.  No fevers, chills or night sweats.  No shortness of breath or cough.  She has normal appetite and has had no issues with nausea, vomiting, constipation or diarrhea.  No abdominal pain or cramping or rectal bleeding.  No dysuria, urinary frequency or hematuria.  No swelling of extremities although she experiences some arthritic pain controlled with medications.  Her last labs on February 2012 showed an LDH of 189. Today's LDH pending. She states she is up todate with mammograms, and is due for another one soon. Reports are not available for review.    MEDICAL HISTORY: Past Medical History  Diagnosis Date  . Hx of CABG     2007  . Breast cancer     Double mastectomy 2007  . Hypertension   . Diabetes mellitus   . Ejection fraction     EF 65%, echo, [place], Jan 16, 2011  . Sleep apnea     CPAP being arranged May, 2011  . Dyslipidemia   . CAD (coronary artery disease)     Nuclear stress test Feb 11, 2011, EF 64%, no scar or ischemia    //         catheterization, November, 2011,patent LIMA-LAD-small caliber distal vessel with diffuse plaque, patent SVG to OM1 and OM 2, patent SVG to PDA and PLA, occluded SVG to diagonal, medical therapy  . Overweight     stomach stapling 1985 followed by weight loss, then return of weight  . Tobacco abuse     in the past, resolved  . Alcohol abuse     in the past, resolved  . Nausea & vomiting     hospitalization, November, 2011, stricture GE junction, questionable stenosis gastrojejunostomy,  disruptive primary peristaltic wave, GE reflux, delayed emptying proximal gastric pouch  . Hx of colonic polyps     SURGICAL HISTORY:  Past Surgical History  Procedure Date  . Coronary artery bypass graft   . Mastectomy   . Coronary stent placement   . Abdominal hysterectomy     MEDICATIONS: Current Outpatient Prescriptions  Medication Sig Dispense Refill  . albuterol (PROVENTIL) (2.5 MG/3ML) 0.083% nebulizer solution Take 2.5 mg by nebulization every 6 (six) hours as needed. Shortness of breath      . aspirin 81 MG tablet Take 81 mg by mouth daily.        [name] Kitchen atorvastatin (LIPITOR) 80 MG tablet Take 80 mg by mouth daily.        . furosemide (LASIX) 40 MG tablet Take 40 mg by mouth daily.        [name] Kitchen HYDROcodone-acetaminophen (NORCO) 10-325 MG per tablet Take 1 tablet by mouth every 6 (six) hours as needed. For pain      . isosorbide mononitrate (IMDUR) 60 MG 24 hr tablet Take 60 mg by mouth daily.        [name] Kitchen lisinopril-hydrochlorothiazide (PRINZIDE,ZESTORETIC) 20-12.5 MG per tablet Take 1 tablet by mouth daily.        . metFORMIN (GLUMETZA) 1000 MG (MOD) 24 hr tablet Take 1,000 mg by mouth daily with breakfast.      .  metoprolol tartrate (LOPRESSOR) 25 MG tablet Take 25 mg by mouth 2 (two) times daily.        . nitroGLYCERIN (NITROSTAT) 0.4 MG SL tablet Place 0.4 mg under the tongue every 5 (five) minutes as needed. For chest pain        ALLERGIES:   has no known allergies.  REVIEW OF SYSTEMS:  The rest of the 14-point review of system was negative.   There were no vitals filed for this visit. Wt Readings from Last 3 Encounters:  07/16/11 222 lb (100.699 kg)  03/27/11 224 lb 12.8 oz (101.969 kg)  03/10/11 224 lb (101.606 kg)   ECOG Performance status:   PHYSICAL EXAMINATION:   In general, this is a well-developed, well-nourished black female in no acute distress.  HEENT:  Sclerae nonicteric.  There is no oral thrush or mucositis.  Skin:  No rashes or lesions.  Lymph:  No peripheral  lymphadenopathy.  Cardiac:  Reveals regular rate and rhythm without murmurs or gallops.  Peripheral pulses are palpable.  Chest:  Lungs are clear to auscultation.  She has bilateral mastectomy scars with no chest wall tenderness or masses palpated.  Abdomen:  Positive bowel sounds, soft, nontender, nondistended.  No organomegaly.  Extremities:  Without edema or cyanosis.  Neuro:  Alert and oriented times three.  Strength, sensation and coordination all grossly intact.    LABORATORY/RADIOLOGY DATA:   Lab 12/03/11 1043  WBC 7.7  HGB 13.3  HCT 39.9  PLT 221  MCV 86.2  MCH 28.6  MCHC 33.2  RDW 16.3*  LYMPHSABS 3.6*  MONOABS 0.7  EOSABS 0.1  BASOSABS 0.1  BANDABS --    CMP   No results found for this basename: NA:5,K:5,CL:5,CO2:5,GLUCOSE:5,BUN:5,CREATININE:5,GFRCGP,:5,CALCIUM:5,MG:5,AST:5,ALT:5,ALKPHOS:5,BILITOT:5 in the last 168 hours      Component Value Date/Time   BILITOT 0.4 10/24/2010 1233   BILIDIR 0.5* 07/18/2007 1916   IBILI 0.9 07/18/2007 1916     Radiology Studies:  No results found.     ASSESSMENT AND PLAN:   1.  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  Tumor was 2.0 x 1.7 x 2.2 cm with vascular invasion.  Estrogen receptor 95% and progesterone receptor 20%.  HER-2/neu was 1+.  She had initially undergone a right lumpectomy, but went on to receive modified right radical mastectomy with tumor stage T2 N0.  She also underwent left mastectomy at the same time.  Postoperatively, she did not receive radiation chemotherapy, but at some point was on both tamoxifen and Arimidex, which she continued through November 2009.  She has had no evidence of disease recurrence since that time.  Of note, her last chest x-ray PA and lateral was during a hospitalization in July 21, 2010 and revealed increase elevation of the right hemi-diaphragm stable minimal bibasilar atelectasis or scarring. Patient is clinically  stable a this time.   2.  Dr. Arline Asp has seen and evaluated the patient. He discussed with her her current status.He has given the patient the option to return to our office in 1 year versus return to her primary physician who is to follow up with yearly mammograms. She has opted to follow up with her PCP . We will be happy to see the patient as needed, and has been instructed to call us if she has any questions or concerns. It has been a pleasure to care for [name].

## 2012-01-11 ENCOUNTER — Encounter (HOSPITAL_COMMUNITY): Payer: Self-pay | Admitting: *Deleted

## 2012-01-11 ENCOUNTER — Emergency Department (HOSPITAL_COMMUNITY)
Admission: EM | Admit: 2012-01-11 | Discharge: 2012-01-11 | Disposition: A | Discharge status: Home / Self Care | Payer: Medicaid Other | Attending: Emergency Medicine | Admitting: Emergency Medicine

## 2012-01-11 DIAGNOSIS — Z853 Personal history of malignant neoplasm of breast: Secondary | ICD-10-CM | POA: Insufficient documentation

## 2012-01-11 DIAGNOSIS — J309 Allergic rhinitis, unspecified: Secondary | ICD-10-CM | POA: Insufficient documentation

## 2012-01-11 DIAGNOSIS — G4730 Sleep apnea, unspecified: Secondary | ICD-10-CM | POA: Insufficient documentation

## 2012-01-11 DIAGNOSIS — I10 Essential (primary) hypertension: Secondary | ICD-10-CM | POA: Insufficient documentation

## 2012-01-11 DIAGNOSIS — J320 Chronic maxillary sinusitis: Secondary | ICD-10-CM | POA: Insufficient documentation

## 2012-01-11 DIAGNOSIS — Z87891 Personal history of nicotine dependence: Secondary | ICD-10-CM | POA: Insufficient documentation

## 2012-01-11 DIAGNOSIS — I2510 Atherosclerotic heart disease of native coronary artery without angina pectoris: Secondary | ICD-10-CM | POA: Insufficient documentation

## 2012-01-11 DIAGNOSIS — Z951 Presence of aortocoronary bypass graft: Secondary | ICD-10-CM | POA: Insufficient documentation

## 2012-01-11 DIAGNOSIS — Z79899 Other long term (current) drug therapy: Secondary | ICD-10-CM | POA: Insufficient documentation

## 2012-01-11 DIAGNOSIS — E119 Type 2 diabetes mellitus without complications: Secondary | ICD-10-CM | POA: Insufficient documentation

## 2012-01-11 DIAGNOSIS — J45909 Unspecified asthma, uncomplicated: Secondary | ICD-10-CM | POA: Insufficient documentation

## 2012-01-11 MED ORDER — PREDNISONE 20 MG PO TABS: 40.0000 mg | ORAL_TABLET | Freq: Every day | ORAL | Status: AC

## 2012-01-11 MED ORDER — CETIRIZINE HCL 10 MG PO TABS: 10.0000 mg | ORAL_TABLET | Freq: Every day | ORAL | Status: DC

## 2012-01-11 MED ORDER — AZITHROMYCIN 250 MG PO TABS: 250.0000 mg | ORAL_TABLET | Freq: Every day | ORAL | Status: AC

## 2012-01-11 NOTE — ED Notes (Signed)
Pt c/o wheezing, woke up from sleep at 3 am, used inhaler x 3 with no relief.  Audible expiratory wheezing

## 2012-01-11 NOTE — Discharge Instructions (Signed)
Please use the medicines exactly as prescribed and return to see your doctor in the next few days if your symptoms continue.  Please return for severe or worsening pain, cough, high fevers.  If the cough / itching or sneezing and congestion is severe you may take 50mg  of benadryl in addition to the other medicines listed above.  You are not wheezing right now - continue to use your albuterol as needed for ongoing wheezing or coughing.

## 2012-01-11 NOTE — ED Provider Notes (Signed)
History     CSN: [id number]  Arrival date & time 01/11/12  0540   First MD Initiated Contact with Patient 01/11/12 0559      Chief Complaint  Patient presents with  . Asthma    (Consider location/radiation/quality/duration/timing/severity/associated sxs/prior treatment) HPI Comments: Pt has had 2 days of cough, sinus drainage, nasal congestion, eye itching, and sneezing - she states she has had allergic rhinitis and seasonal allergies in the past similar to this.  She denies fevers.  She has had albuterol pta with some improvement.  Sx are moderate at this time.  Patient is a 61 y.o. female presenting with asthma. The history is provided by the patient and the spouse.  Asthma Associated symptoms include shortness of breath. Pertinent negatives include no chest pain and no headaches.    Past Medical History  Diagnosis Date  . Hx of CABG     2007  . Breast cancer     Double mastectomy 2007  . Hypertension   . Diabetes mellitus   . Ejection fraction     EF 65%, echo, [place], Jan 16, 2011  . Sleep apnea     CPAP being arranged May, 2011  . Dyslipidemia   . CAD (coronary artery disease)     Nuclear stress test Feb 11, 2011, EF 64%, no scar or ischemia    //         catheterization, November, 2011,patent LIMA-LAD-small caliber distal vessel with diffuse plaque, patent SVG to OM1 and OM 2, patent SVG to PDA and PLA, occluded SVG to diagonal, medical therapy  . Overweight     stomach stapling 1985 followed by weight loss, then return of weight  . Tobacco abuse     in the past, resolved  . Alcohol abuse     in the past, resolved  . Nausea & vomiting     hospitalization, November, 2011, stricture GE junction, questionable stenosis gastrojejunostomy, disruptive primary peristaltic wave, GE reflux, delayed emptying proximal gastric pouch  . Hx of colonic polyps   . Asthma     Past Surgical History  Procedure Date  . Coronary artery bypass graft   . Mastectomy   . Coronary  stent placement   . Abdominal hysterectomy     Family History  Problem Relation Age of Onset  . Hypertension Mother   . Heart disease Mother   . Diabetes Mother   . Heart disease Father   . Heart disease Sister   . Stomach cancer Sister   . Asthma Sister   . Colon cancer Father     History  Substance Use Topics  . Smoking status: Former Smoker -- 3.0 packs/day for 27 years    Types: Cigarettes    Quit date: 09/14/2000  . Smokeless tobacco: Former Neurosurgeon    Types: Chew    Quit date: 09/14/1990  . Alcohol Use: No    OB History    Grav Para Term Preterm Abortions TAB SAB Ect Mult Living                  Review of Systems  Constitutional: Negative for fever and chills.  HENT: Positive for congestion, rhinorrhea and sneezing.   Eyes: Positive for itching. Negative for photophobia, pain, redness and visual disturbance.  Respiratory: Positive for cough, shortness of breath and wheezing. Negative for chest tightness.   Cardiovascular: Negative for chest pain.  Gastrointestinal: Negative for nausea and vomiting.  Genitourinary: Negative for dysuria.  Musculoskeletal: Negative for back  pain and joint swelling.  Skin: Negative for rash.  Neurological: Negative for headaches.    Allergies  Review of patient's allergies indicates no known allergies.  Home Medications   Current Outpatient Rx  Name Route Sig Dispense Refill  . ALBUTEROL SULFATE (2.5 MG/3ML) 0.083% IN NEBU Nebulization Take 2.5 mg by nebulization every 6 (six) hours as needed. Shortness of breath    . ASPIRIN 81 MG PO TABS Oral Take 81 mg by mouth daily.      . AZITHROMYCIN 250 MG PO TABS Oral Take 1 tablet (250 mg total) by mouth daily. Take first 2 tablets together, then 1 every day until finished. 6 tablet 0  . CETIRIZINE HCL 10 MG PO TABS Oral Take 1 tablet (10 mg total) by mouth daily. 30 tablet 1  . LISINOPRIL-HYDROCHLOROTHIAZIDE 20-12.5 MG PO TABS Oral Take 1 tablet by mouth daily.      [name] Kitchen NITROGLYCERIN  0.4 MG SL SUBL Sublingual Place 0.4 mg under the tongue every 5 (five) minutes as needed. For chest pain    . PREDNISONE 20 MG PO TABS Oral Take 2 tablets (40 mg total) by mouth daily. 10 tablet 0    BP 133/62  Pulse 85  Temp(Src) 98.3 F (36.8 C) (Oral)  Resp 16  SpO2 94%  Physical Exam  Nursing note and vitals reviewed. Constitutional: She appears well-developed and well-nourished. No distress.  HENT:  Head: Normocephalic and atraumatic.  Mouth/Throat: Oropharynx is clear and moist. No oropharyngeal exudate.       OP clear, MMM, nares with clear rhinorrhea and swelling of turbinats bialterally.  ttp over sinuses  Eyes: EOM are normal. Pupils are equal, round, and reactive to light. Right eye exhibits no discharge. Left eye exhibits no discharge. No scleral icterus.       Slight conj injection bil  Neck: Normal range of motion. Neck supple. No JVD present. No thyromegaly present.  Cardiovascular: Normal rate, regular rhythm, normal heart sounds and intact distal pulses.  Exam reveals no gallop and no friction rub.   No murmur heard. Pulmonary/Chest: Effort normal and breath sounds normal. No respiratory distress. She has no wheezes. She has no rales.       NO WHEEZING, normal WOB  Abdominal: Soft. Bowel sounds are normal. She exhibits no distension and no mass. There is no tenderness.  Musculoskeletal: Normal range of motion. She exhibits no edema and no tenderness.  Lymphadenopathy:    She has no cervical adenopathy.  Neurological: She is alert. Coordination normal.  Skin: Skin is warm and dry. No rash noted. No erythema.  Psychiatric: She has a normal mood and affect. Her behavior is normal.    ED Course  Procedures (including critical care time)  Labs Reviewed - No data to display No results found.   1. Allergic rhinitis   2. Asthma   3.  sinusitis    MDM  Pt appears to have allergic rhinitis and seasonal allergies - no wheezing at this time, no resp distress.   Appears stable for d/c - start meds as below.  Pt took amox for sinusitis 1 month ago per med records from her primary doctor.  States improved but now has sx again - likely interrelated to allergies.  Discharge Prescriptions include:  Zyrtec Prednisone Zithromax        Vida Roller, MD 01/11/12 [phone]

## 2012-01-11 NOTE — ED Notes (Signed)
Patient is AOx4 and comfortable with her discharge instructions. 

## 2012-01-29 ENCOUNTER — Inpatient Hospital Stay (HOSPITAL_COMMUNITY)
Admission: EM | Admit: 2012-01-29 | Discharge: 2012-01-30 | DRG: 313 | Disposition: A | Discharge status: Home / Self Care | Payer: Medicaid Other | Attending: Cardiology | Admitting: Cardiology

## 2012-01-29 ENCOUNTER — Emergency Department (HOSPITAL_COMMUNITY): Payer: Medicaid Other

## 2012-01-29 ENCOUNTER — Encounter (HOSPITAL_COMMUNITY): Payer: Self-pay

## 2012-01-29 DIAGNOSIS — Z9861 Coronary angioplasty status: Secondary | ICD-10-CM

## 2012-01-29 DIAGNOSIS — I252 Old myocardial infarction: Secondary | ICD-10-CM

## 2012-01-29 DIAGNOSIS — K219 Gastro-esophageal reflux disease without esophagitis: Secondary | ICD-10-CM | POA: Diagnosis present

## 2012-01-29 DIAGNOSIS — E119 Type 2 diabetes mellitus without complications: Secondary | ICD-10-CM | POA: Diagnosis present

## 2012-01-29 DIAGNOSIS — R0789 Other chest pain: Principal | ICD-10-CM | POA: Diagnosis present

## 2012-01-29 DIAGNOSIS — I2510 Atherosclerotic heart disease of native coronary artery without angina pectoris: Secondary | ICD-10-CM | POA: Diagnosis present

## 2012-01-29 DIAGNOSIS — E669 Obesity, unspecified: Secondary | ICD-10-CM | POA: Diagnosis present

## 2012-01-29 DIAGNOSIS — Z79899 Other long term (current) drug therapy: Secondary | ICD-10-CM

## 2012-01-29 DIAGNOSIS — G4733 Obstructive sleep apnea (adult) (pediatric): Secondary | ICD-10-CM | POA: Diagnosis present

## 2012-01-29 DIAGNOSIS — E785 Hyperlipidemia, unspecified: Secondary | ICD-10-CM | POA: Diagnosis present

## 2012-01-29 DIAGNOSIS — Z7982 Long term (current) use of aspirin: Secondary | ICD-10-CM

## 2012-01-29 DIAGNOSIS — R079 Chest pain, unspecified: Secondary | ICD-10-CM

## 2012-01-29 DIAGNOSIS — I25119 Atherosclerotic heart disease of native coronary artery with unspecified angina pectoris: Secondary | ICD-10-CM | POA: Diagnosis present

## 2012-01-29 DIAGNOSIS — I10 Essential (primary) hypertension: Secondary | ICD-10-CM | POA: Diagnosis present

## 2012-01-29 DIAGNOSIS — J45909 Unspecified asthma, uncomplicated: Secondary | ICD-10-CM | POA: Diagnosis present

## 2012-01-29 DIAGNOSIS — Z853 Personal history of malignant neoplasm of breast: Secondary | ICD-10-CM

## 2012-01-29 DIAGNOSIS — E663 Overweight: Secondary | ICD-10-CM | POA: Diagnosis present

## 2012-01-29 DIAGNOSIS — Z951 Presence of aortocoronary bypass graft: Secondary | ICD-10-CM

## 2012-01-29 DIAGNOSIS — Z87891 Personal history of nicotine dependence: Secondary | ICD-10-CM

## 2012-01-29 HISTORY — DX: Acute myocardial infarction, unspecified: I21.9

## 2012-01-29 LAB — DIFFERENTIAL
Basophils Absolute: 0 10*3/uL (ref 0.0–0.1)
Basophils Relative: 0 % (ref 0–1)
Eosinophils Absolute: 0 10*3/uL (ref 0.0–0.7)
Eosinophils Relative: 1 % (ref 0–5)
Monocytes Absolute: 0.4 10*3/uL (ref 0.1–1.0)
Monocytes Relative: 9 % (ref 3–12)
Neutro Abs: 1.2 10*3/uL — ABNORMAL LOW (ref 1.7–7.7)

## 2012-01-29 LAB — CBC
HCT: 38.7 % (ref 36.0–46.0)
HCT: 39.5 % (ref 36.0–46.0)
Hemoglobin: 12.5 g/dL (ref 12.0–15.0)
Hemoglobin: 12.8 g/dL (ref 12.0–15.0)
MCH: 28.2 pg (ref 26.0–34.0)
MCH: 28.4 pg (ref 26.0–34.0)
MCHC: 32.3 g/dL (ref 30.0–36.0)
MCV: 87.6 fL (ref 78.0–100.0)
Platelets: 191 10*3/uL (ref 150–400)
RBC: 4.51 MIL/uL (ref 3.87–5.11)
RDW: 14.8 % (ref 11.5–15.5)

## 2012-01-29 LAB — CARDIAC PANEL(CRET KIN+CKTOT+MB+TROPI)
CK, MB: 4.5 ng/mL — ABNORMAL HIGH (ref 0.3–4.0)
Total CK: 278 U/L — ABNORMAL HIGH (ref 7–177)
Troponin I: <0.3 ng/mL (ref <0.30)
Troponin I: <0.3 ng/mL (ref <0.30)

## 2012-01-29 LAB — BASIC METABOLIC PANEL
BUN: 8 mg/dL (ref 6–23)
Calcium: 9 mg/dL (ref 8.4–10.5)
Chloride: 103 mEq/L (ref 96–112)
Creatinine, Ser: 0.71 mg/dL (ref 0.50–1.10)
GFR calc Af Amer: >90 mL/min (ref >90)
GFR calc non Af Amer: >90 mL/min (ref >90)

## 2012-01-29 LAB — POCT I-STAT TROPONIN I: Troponin i, poc: 0.03 ng/mL (ref 0.00–0.08)

## 2012-01-29 LAB — GLUCOSE, CAPILLARY: Glucose-Capillary: 135 mg/dL — ABNORMAL HIGH (ref 70–99)

## 2012-01-29 MED ORDER — PANTOPRAZOLE SODIUM 40 MG PO TBEC
40.0000 mg | DELAYED_RELEASE_TABLET | Freq: Every day | ORAL | Status: DC
  Administered 2012-01-30: 40 mg via ORAL

## 2012-01-29 MED ORDER — SODIUM CHLORIDE 0.9 % IJ SOLN: 3.0000 mL | INTRAMUSCULAR | Status: DC | PRN

## 2012-01-29 MED ORDER — SIMVASTATIN 20 MG PO TABS
20.0000 mg | ORAL_TABLET | Freq: Every day | ORAL | Status: DC
  Administered 2012-01-30: 20 mg via ORAL
  Filled 2012-01-29: qty 1

## 2012-01-29 MED ORDER — LISINOPRIL-HYDROCHLOROTHIAZIDE 20-12.5 MG PO TABS: 1.0000 | ORAL_TABLET | Freq: Every day | ORAL | Status: DC

## 2012-01-29 MED ORDER — LORATADINE 10 MG PO TABS
10.0000 mg | ORAL_TABLET | Freq: Every day | ORAL | Status: DC
  Administered 2012-01-30: 10 mg via ORAL
  Filled 2012-01-29: qty 1

## 2012-01-29 MED ORDER — GLIPIZIDE 10 MG PO TABS
10.0000 mg | ORAL_TABLET | Freq: Every day | ORAL | Status: DC
  Administered 2012-01-30: 10 mg via ORAL
  Filled 2012-01-29 (×2): qty 1

## 2012-01-29 MED ORDER — LISINOPRIL 20 MG PO TABS
20.0000 mg | ORAL_TABLET | Freq: Every day | ORAL | Status: DC
  Administered 2012-01-30: 20 mg via ORAL
  Filled 2012-01-29: qty 1

## 2012-01-29 MED ORDER — HYDROCODONE-ACETAMINOPHEN 10-325 MG PO TABS: 1.0000 | ORAL_TABLET | Freq: Every day | ORAL | Status: DC | PRN

## 2012-01-29 MED ORDER — ONDANSETRON HCL 4 MG/2ML IJ SOLN: 4.0000 mg | Freq: Four times a day (QID) | INTRAMUSCULAR | Status: DC | PRN

## 2012-01-29 MED ORDER — SUCRALFATE 1 GM/10ML PO SUSP
1.0000 g | Freq: Two times a day (BID) | ORAL | Status: DC
  Filled 2012-01-29 (×2): qty 10

## 2012-01-29 MED ORDER — HYDROCHLOROTHIAZIDE 12.5 MG PO CAPS
12.5000 mg | ORAL_CAPSULE | Freq: Every day | ORAL | Status: DC
  Administered 2012-01-30: 12.5 mg via ORAL
  Filled 2012-01-29: qty 1

## 2012-01-29 MED ORDER — METOCLOPRAMIDE HCL 5 MG PO TABS
5.0000 mg | ORAL_TABLET | Freq: Two times a day (BID) | ORAL | Status: DC
  Filled 2012-01-29: qty 1

## 2012-01-29 MED ORDER — ALBUTEROL SULFATE HFA 108 (90 BASE) MCG/ACT IN AERS: 2.0000 | INHALATION_SPRAY | Freq: Four times a day (QID) | RESPIRATORY_TRACT | Status: DC | PRN

## 2012-01-29 MED ORDER — ZOLPIDEM TARTRATE 5 MG PO TABS: 10.0000 mg | ORAL_TABLET | Freq: Every evening | ORAL | Status: DC | PRN

## 2012-01-29 MED ORDER — NITROGLYCERIN 0.4 MG SL SUBL: 0.4000 mg | SUBLINGUAL_TABLET | SUBLINGUAL | Status: DC | PRN

## 2012-01-29 MED ORDER — ALPRAZOLAM 0.25 MG PO TABS: 0.2500 mg | ORAL_TABLET | Freq: Two times a day (BID) | ORAL | Status: DC | PRN

## 2012-01-29 MED ORDER — ENOXAPARIN SODIUM 40 MG/0.4ML ~~LOC~~ SOLN
40.0000 mg | SUBCUTANEOUS | Status: DC
  Administered 2012-01-29: 40 mg via SUBCUTANEOUS
  Filled 2012-01-29 (×2): qty 0.4

## 2012-01-29 MED ORDER — SODIUM CHLORIDE 0.9 % IV SOLN: 250.0000 mL | INTRAVENOUS | Status: DC | PRN

## 2012-01-29 MED ORDER — METOCLOPRAMIDE HCL 5 MG PO TABS
5.0000 mg | ORAL_TABLET | Freq: Two times a day (BID) | ORAL | Status: DC
  Administered 2012-01-30: 5 mg via ORAL
  Filled 2012-01-29 (×3): qty 1

## 2012-01-29 MED ORDER — SODIUM CHLORIDE 0.9 % IJ SOLN
3.0000 mL | Freq: Two times a day (BID) | INTRAMUSCULAR | Status: DC
  Administered 2012-01-29 – 2012-01-30 (×2): 3 mL via INTRAVENOUS

## 2012-01-29 MED ORDER — ASPIRIN 81 MG PO CHEW
324.0000 mg | CHEWABLE_TABLET | Freq: Once | ORAL | Status: AC
  Administered 2012-01-29: 324 mg via ORAL
  Filled 2012-01-29: qty 4

## 2012-01-29 MED ORDER — ASPIRIN EC 81 MG PO TBEC
81.0000 mg | DELAYED_RELEASE_TABLET | Freq: Every day | ORAL | Status: DC
  Administered 2012-01-30: 81 mg via ORAL
  Filled 2012-01-29: qty 1

## 2012-01-29 MED ORDER — ACETAMINOPHEN 325 MG PO TABS: 650.0000 mg | ORAL_TABLET | ORAL | Status: DC | PRN

## 2012-01-29 NOTE — ED Notes (Signed)
Pt complains of chest pain onset 20 mins ago while walking into store also reports headache

## 2012-01-29 NOTE — ED Notes (Signed)
MD at bedside.  Cardiologist.

## 2012-01-29 NOTE — ED Provider Notes (Signed)
History     CSN: [id number]  Arrival date & time 01/29/12  1130   First MD Initiated Contact with Patient 01/29/12 1139      Chief Complaint  Patient presents with  . Chest Pain    (Consider location/radiation/quality/duration/timing/severity/associated sxs/prior treatment) HPI History provided by pt.   Pt was walking through store at approx 11am today when she developed a "hard" or sharp,  pain in the center of her chest that radiated to her throat and lasted for close to an hour.  After pain resolved, had multiple brief pains of lesser severity at rest.  No modifying factors.  Associated w/ SOB; no nausea or diaphoresis.  Has not had LE pain/edema.  Pt has h/o MI and reports that today's symptoms are similar to what she experienced at that time.  Per prior chart, pt had a CABG in 2007, most recent cath in 07/2010(medical management recommended) and nuclear scan in 01/2011 that didn't show any significant ischemia.     Past Medical History  Diagnosis Date  . Hx of CABG     2007  . Breast cancer     Double mastectomy 2007  . Hypertension   . Diabetes mellitus   . Ejection fraction     EF 65%, echo, [place], Jan 16, 2011  . Sleep apnea     CPAP being arranged May, 2011  . Dyslipidemia   . CAD (coronary artery disease)     Nuclear stress test Feb 11, 2011, EF 64%, no scar or ischemia    //         catheterization, November, 2011,patent LIMA-LAD-small caliber distal vessel with diffuse plaque, patent SVG to OM1 and OM 2, patent SVG to PDA and PLA, occluded SVG to diagonal, medical therapy  . Overweight     stomach stapling 1985 followed by weight loss, then return of weight  . Tobacco abuse     in the past, resolved  . Alcohol abuse     in the past, resolved  . Nausea & vomiting     hospitalization, November, 2011, stricture GE junction, questionable stenosis gastrojejunostomy, disruptive primary peristaltic wave, GE reflux, delayed emptying proximal gastric pouch  . Hx of  colonic polyps   . Asthma     Past Surgical History  Procedure Date  . Coronary artery bypass graft   . Mastectomy   . Coronary stent placement   . Abdominal hysterectomy     Family History  Problem Relation Age of Onset  . Hypertension Mother   . Heart disease Mother   . Diabetes Mother   . Heart disease Father   . Heart disease Sister   . Stomach cancer Sister   . Asthma Sister   . Colon cancer Father     History  Substance Use Topics  . Smoking status: Former Smoker -- 3.0 packs/day for 27 years    Types: Cigarettes    Quit date: 09/14/2000  . Smokeless tobacco: Former Neurosurgeon    Types: Chew    Quit date: 09/14/1990  . Alcohol Use: No    OB History    Grav Para Term Preterm Abortions TAB SAB Ect Mult Living                  Review of Systems  All other systems reviewed and are negative.    Allergies  Review of patient's allergies indicates no known allergies.  Home Medications   Current Outpatient Rx  Name Route Sig Dispense Refill  .  ALBUTEROL SULFATE HFA 108 (90 BASE) MCG/ACT IN AERS Inhalation Inhale 2 puffs into the lungs every 6 (six) hours as needed. For shortness of breath    . ALBUTEROL SULFATE (2.5 MG/3ML) 0.083% IN NEBU Nebulization Take 2.5 mg by nebulization every 6 (six) hours as needed. Shortness of breath    . ASPIRIN EC 81 MG PO TBEC Oral Take 81 mg by mouth daily.    [name] Kitchen CETIRIZINE HCL 10 MG PO TABS Oral Take 10 mg by mouth daily.    [name] Kitchen GLIPIZIDE 10 MG PO TABS Oral Take 10 mg by mouth daily.    [name] Kitchen HYDROCODONE-ACETAMINOPHEN 10-300 MG PO TABS Oral Take 1 tablet by mouth 2 (two) times daily as needed. For pain    . LISINOPRIL-HYDROCHLOROTHIAZIDE 20-12.5 MG PO TABS Oral Take 1 tablet by mouth daily.      [name] Kitchen METOCLOPRAMIDE HCL 5 MG PO TABS Oral Take 5 mg by mouth 2 (two) times daily.    [name] Kitchen NITROGLYCERIN 0.4 MG SL SUBL Sublingual Place 0.4 mg under the tongue every 5 (five) minutes as needed. For chest pain    . RABEPRAZOLE SODIUM 20 MG PO TBEC Oral  Take 20 mg by mouth daily.    [name] Kitchen SIMVASTATIN 20 MG PO TABS Oral Take 20 mg by mouth daily.    . SUCRALFATE 1 GM/10ML PO SUSP Oral Take 1 g by mouth 2 (two) times daily.      BP 171/68  Pulse 79  Temp(Src) 98.1 F (36.7 C) (Oral)  Resp 18  SpO2 96%  Physical Exam  Nursing note and vitals reviewed. Constitutional: She is oriented to person, place, and time. She appears well-developed and well-nourished. No distress.       obese  HENT:  Head: Normocephalic and atraumatic.  Eyes:       Normal appearance  Neck: Normal range of motion.  Cardiovascular: Normal rate, regular rhythm and intact distal pulses.   Pulmonary/Chest: Effort normal and breath sounds normal. No respiratory distress.       Mild sternal ttp.  No pleuritic pain reported  Abdominal: Soft. Bowel sounds are normal. She exhibits no distension. There is no tenderness. There is no guarding.  Musculoskeletal: Normal range of motion.       No peripheral edema or calf tenderness  Neurological: She is alert and oriented to person, place, and time.  Skin: Skin is warm and dry. No rash noted.  Psychiatric: She has a normal mood and affect. Her behavior is normal.    ED Course  Procedures (including critical care time)  Labs Reviewed  DIFFERENTIAL - Abnormal; Notable for the following:    Neutrophils Relative 27 (*)    Neutro Abs 1.2 (*)    Lymphocytes Relative 63 (*)    All other components within normal limits  BASIC METABOLIC PANEL - Abnormal; Notable for the following:    Glucose, Bld 149 (*)    All other components within normal limits  CARDIAC PANEL(CRET KIN+CKTOT+MB+TROPI) - Abnormal; Notable for the following:    Total CK 294 (*)    CK, MB 5.0 (*)    All other components within normal limits  GLUCOSE, CAPILLARY - Abnormal; Notable for the following:    Glucose-Capillary 135 (*)    All other components within normal limits  CBC  POCT I-STAT TROPONIN I  CBC  CREATININE, SERUM  CARDIAC PANEL(CRET  KIN+CKTOT+MB+TROPI)  CARDIAC PANEL(CRET KIN+CKTOT+MB+TROPI)  BASIC METABOLIC PANEL  CBC   Dg Chest 2 View  01/29/2012  *RADIOLOGY REPORT*  Clinical Data: 61 year old female with chest pain, shortness of breath.  CHEST - 2 VIEW  Comparison: 09/23/2011 and earlier.  Findings: Chronic moderate elevation of the right hemidiaphragm is stable.  Stable cardiac size and mediastinal contours.  Sequelae of CABG.  No pneumothorax, pulmonary edema, pleural effusion or acute pulmonary opacity.  Stable postoperative changes to the right axilla. No acute osseous abnormality identified.  IMPRESSION: No acute cardiopulmonary abnormality.  Original Report Authenticated By: Ulla Potash III, M.D.     1. Chest pain   2. CAD (coronary artery disease)       MDM  61yo F w/ h/o MI and CABG presents w/ exertional, radiating CP that feels similar to her past MI.  Currently CP free. Aspirin given. No acute findings on exam.  EKG non-ischemic.  Troponin and CXR neg.  [facility] consulted for admission.          Otilio Miu, [place] 01/29/12 2230

## 2012-01-29 NOTE — Consult Note (Addendum)
Cardiology Admission Note   Patient ID: [name] MRN: [id number], DOB/AGE: [birth year]   Admit date: 01/29/2012 Date of Consult: 01/29/2012  Primary Physician: Aurelio Brash., NP, NP Primary Cardiologist: Jerral Bonito, MD  Pt. Profile: [name] is a 61yo female with PMHx significant for CAD (past cardiac history outlined below), type 2 DM, HTN, HL, history of tobacco (20+ pack-year history) and alcohol abuse, breast cancer (double mastectomy 2007 s/p tamoxifen and arimidex therapy; no recurrence since 05/2008), OSA and obesity who presents to [facility] ED today with complaints of chest pain.   Problem List: Past Medical History  Diagnosis Date  . Hx of CABG     2007  . Breast cancer     Double mastectomy 2007; s/p tamoxifen and arimidex therapy; no recurrence since 05/2008  . Hypertension   . Diabetes mellitus   . Ejection fraction     EF 65%, echo, [place], Jan 16, 2011  . Sleep apnea     CPAP being arranged May, 2011  . Dyslipidemia   . CAD (coronary artery disease)     Nuclear stress test Feb 11, 2011, EF 64%, no scar or ischemia    //         catheterization, November, 2011,patent LIMA-LAD-small caliber distal vessel with diffuse plaque, patent SVG to OM1 and OM 2, patent SVG to PDA and PLA, occluded SVG to diagonal, medical therapy  . Overweight     stomach stapling 1985 followed by weight loss, then return of weight  . Tobacco abuse     in the past, resolved  . Alcohol abuse     in the past, resolved  . Nausea & vomiting     hospitalization, November, 2011, stricture GE junction, questionable stenosis gastrojejunostomy, disruptive primary peristaltic wave, GE reflux, delayed emptying proximal gastric pouch  . Hx of colonic polyps   . Asthma     Past Surgical History  Procedure Date  . Coronary artery bypass graft   . Mastectomy   . Coronary stent placement   . Abdominal hysterectomy     Past Cardiac History  CABG x 6 in 09/07: LIMA-LAD, SVG-diagonal,  sequential SVG-OM1-OM2, sequential SVG-distal RCA and PDA.   Last 2D echo 05/12: LVEF 65%, no WMAs, mild LVH and mild diastolic dysfunction, moderate RV dilatation, normal function, mild LA dilatation  Last cardiac cath 11/11:   1. Normal left ventricular function. 2. Patent internal mammary to the LAD with a small caliber distal     vessel that may demonstrate some diffuse plaque. 3. Patent saphenous vein graft to the first and second obtuse     marginals. 4. Continued patent saphenous vein graft to the PDA, PLA segment. 5. Occluded saphenous vein graft to the diagonal. 6. Preserved left ventricular function.  Last stress test 05/12: Low-risk stress nuclear test. There is a very small area of mild attenuation in the inferior basal region at rest and with exertion. The uptake in the other regions is normal. The LV function is normal and the inferior wall contracts well. This attenuation may be due to diaphragmatic attenuation.  Allergies: No Known Allergies  HPI:   Prior cath as noted above. Nonobstructive CAD noted. The recommendation was for medical therapy at that time. A Lexiscan Myoview revealed no evidence of ischemia. She was last seen in the office by Dr. Myrtis Ser in 11/12. CAD and HTN were found to be stable.   She reports walking to the store when she experienced sharp, substernal chest pain  radiating to her neck. It lasted for "a couple minutes" and remitted. She reported associated HA, palpitations and lightheadedness. She denies shortness of breath, syncope, n/v/d, diaphoresis, orthopnea, PND or LE swelling. She denies fevers or chills. She does endorse a sinus infection last week with associated upper respiratory congestion. She reports a recent trip to [place], [place] a couple weeks ago, but states she stopped to rest frequently. She reports taking antibiotics for a UTI two weeks ago. Otherwise no urinary changes. She employs the use of a walker to ambulate and takes short walks. There  has been no decreased activity level. This episode was reminiscent of her prior angina, but less severe and transient.   Upon ED arrival, EKG reveals NSR without evidence of ischemia. POC troponin-I was WNL. CBC and BMET unremarkable. CXR reveals no acute cardiopulmonary abnormalities. BP is elevated at 145-171/68-88. Otherwise VSS.   Home Medications: Prior to Admission medications   Medication Sig Start Date End Date Taking? Authorizing Provider  albuterol (PROVENTIL HFA;VENTOLIN HFA) 108 (90 BASE) MCG/ACT inhaler Inhale 2 puffs into the lungs every 6 (six) hours as needed. For shortness of breath   Yes Historical Provider, MD  albuterol (PROVENTIL) (2.5 MG/3ML) 0.083% nebulizer solution Take 2.5 mg by nebulization every 6 (six) hours as needed. Shortness of breath   Yes Historical Provider, MD  aspirin EC 81 MG tablet Take 81 mg by mouth daily.   Yes Historical Provider, MD  cetirizine (ZYRTEC) 10 MG tablet Take 10 mg by mouth daily.   Yes Historical Provider, MD  glipiZIDE (GLUCOTROL) 10 MG tablet Take 10 mg by mouth daily.   Yes Historical Provider, MD  Hydrocodone-Acetaminophen 10-300 MG TABS Take 1 tablet by mouth 2 (two) times daily as needed. For pain   Yes Historical Provider, MD  lisinopril-hydrochlorothiazide (PRINZIDE,ZESTORETIC) 20-12.5 MG per tablet Take 1 tablet by mouth daily.     Yes Historical Provider, MD  metoCLOPramide (REGLAN) 5 MG tablet Take 5 mg by mouth 2 (two) times daily.   Yes Historical Provider, MD  nitroGLYCERIN (NITROSTAT) 0.4 MG SL tablet Place 0.4 mg under the tongue every 5 (five) minutes as needed. For chest pain   Yes Historical Provider, MD  RABEprazole (ACIPHEX) 20 MG tablet Take 20 mg by mouth daily.   Yes Historical Provider, MD  simvastatin (ZOCOR) 20 MG tablet Take 20 mg by mouth daily.   Yes Historical Provider, MD  sucralfate (CARAFATE) 1 GM/10ML suspension Take 1 g by mouth 2 (two) times daily.   Yes Historical Provider, MD    Inpatient  Medications:     . aspirin  324 mg Oral Once    (Not in a hospital admission)  Family History  Problem Relation Age of Onset  . Hypertension Mother   . Heart disease Mother   . Diabetes Mother   . Heart disease Father   . Heart disease Sister   . Stomach cancer Sister   . Asthma Sister   . Colon cancer Father      History   Social History  . Marital Status: Single    Spouse Name: N/A    Number of Children: N/A  . Years of Education: N/A   Occupational History  . Disabled    Social History Main Topics  . Smoking status: Former Smoker -- 3.0 packs/day for 27 years    Types: Cigarettes    Quit date: 09/14/2000  . Smokeless tobacco: Former Neurosurgeon    Types: Chew    Quit date: 09/14/1990  .  Alcohol Use: No  . Drug Use: No  . Sexually Active: Not on file   Other Topics Concern  . Not on file   Social History Narrative  . No narrative on file     Review of Systems: General: negative for chills, fever, night sweats or weight changes.  Cardiovascular: positive for chest pain, palpitations, lightheadedness, negative for dyspnea on exertion, edema, orthopnea, paroxysmal nocturnal dyspnea or shortness of breath Dermatological: negative for rash Respiratory: negative for cough or wheezing Urologic: negative for hematuria Abdominal: negative for nausea, vomiting, diarrhea, bright red blood per rectum, melena, or hematemesis Neurologic: negative for visual changes, syncope, or dizziness All other systems reviewed and are otherwise negative except as noted above.  Physical Exam: Blood pressure 175/70, pulse 81, temperature 98.1 F (36.7 C), temperature source Oral, resp. rate 21, SpO2 98.00%.   General: Obese, well developed, in no acute distress. Head: Normocephalic, atraumatic, sclera non-icteric, no xanthomas, nares are without discharge.  Neck: Negative for carotid bruits. JVD not elevated. Lungs: Clear bilaterally to auscultation without wheezes, rales, or rhonchi.  Breathing is unlabored. Heart: RRR with S1 S2. No murmurs, rubs, or gallops appreciated. Abdomen: Soft, non-tender, non-distended with normoactive bowel sounds. No hepatomegaly. No rebound/guarding. No obvious abdominal masses. Msk:  Strength and tone appears normal for age. Extremities: No clubbing, cyanosis or edema.  Distal pedal pulses are 2+ and equal bilaterally. Neuro: Alert and oriented X 3. Moves all extremities spontaneously. Psych:  Responds to questions appropriately with a normal affect.  Labs: Recent Labs  Basename 01/29/12 1206   WBC 4.5   HGB 12.5   HCT 38.7   MCV 87.2   PLT 185    Lab 01/29/12 1206  NA 139  K 3.9  CL 103  CO2 25  BUN 8  CREATININE 0.71  CALCIUM 9.0  PROT --  BILITOT --  ALKPHOS --  ALT --  AST --  AMYLASE --  LIPASE --  GLUCOSE 149*   Radiology/Studies: Dg Chest 2 View  01/29/2012  *RADIOLOGY REPORT*  Clinical Data: 61 year old female with chest pain, shortness of breath.  CHEST - 2 VIEW  Comparison: 09/23/2011 and earlier.  Findings: Chronic moderate elevation of the right hemidiaphragm is stable.  Stable cardiac size and mediastinal contours.  Sequelae of CABG.  No pneumothorax, pulmonary edema, pleural effusion or acute pulmonary opacity.  Stable postoperative changes to the right axilla. No acute osseous abnormality identified.  IMPRESSION: No acute cardiopulmonary abnormality.  Original Report Authenticated By: Ulla Potash III, M.D.    EKG: NSR, 86 bpm, no ST-T wave changes  ASSESSMENT:   1. Chest pain 2. CAD 3. Type 2 DM 4. HTN 5. HL 6. OSA  - not on CPAP for the past 2 months.  7. History of breast cancer  - no recurrence since 05/2008  DISCUSSION/PLAN:  Chest pain somewhat reminiscent of prior angina and MI in a patient with known cardiac disease s/p bypass grafting six years ago and significant cardiac risk factors. Initial troponin and EKG in the ED reveal no evidence of acute ischemia. Comfortable at present. No prior  or subsequent episodes of chest pain. She did have some evidence of prior graft occlusion (SVG-diagonal); however believed to be due to competitive antegrade flow from the diagonal. Would favor evaluating for continued graft patency. Admit to telemetry for ACS rule-out. Cycle cardiac biomarkers. Daily EKGs. SSI while inpatient. Continue ASA/ACEi/statin/HCTZ. Will add NTG SL PRN for pain.  Signed, R. Hurman Horn, PA-C 01/29/2012, 2:39 PM  History and all data above reviewed.  Patient examined.  I agree with the findings as above.  The patient exam reveals COR: RRR  ,  Lungs: Clear  ,  Abd: Positive bowel sounds, no rebound no guarding, Ext No edema no calf tenderness.  All available labs, radiology testing, previous records reviewed. Agree with documented assessment and plan.  Chest pain mostly atypical.  However, she has known disease.  No objective evidence of ischemia.  Rule out MI if enzymes negative and no further pain she could be discharged in the am with out patient testing.  Jerico Grisso  3:00 PM  01/29/2012

## 2012-01-29 NOTE — ED Notes (Signed)
Pt c/o mid-sternum chest pain radiating up into her neck while walking into a store, pt reports her chest pain was relieved when she set down, pt denies sob, N/V, back.abd pain, or diaphoresis.

## 2012-01-29 NOTE — ED Notes (Signed)
IV team paged for IV access.

## 2012-01-29 NOTE — ED Notes (Signed)
[id number] Ready 

## 2012-01-30 LAB — CARDIAC PANEL(CRET KIN+CKTOT+MB+TROPI): CK, MB: 3.9 ng/mL (ref 0.3–4.0)

## 2012-01-30 LAB — BASIC METABOLIC PANEL
Chloride: 104 mEq/L (ref 96–112)
GFR calc Af Amer: >90 mL/min (ref >90)
GFR calc non Af Amer: >90 mL/min (ref >90)
Potassium: 3.9 mEq/L (ref 3.5–5.1)
Sodium: 139 mEq/L (ref 135–145)

## 2012-01-30 LAB — CBC
HCT: 38.8 % (ref 36.0–46.0)
Hemoglobin: 12.6 g/dL (ref 12.0–15.0)
RBC: 4.44 MIL/uL (ref 3.87–5.11)
WBC: 4.8 10*3/uL (ref 4.0–10.5)

## 2012-01-30 NOTE — ED Provider Notes (Signed)
Medical screening examination/treatment/procedure(s) were conducted as a shared visit with non-physician practitioner(s) and myself.  I personally evaluated the patient during the encounter  Cheri Guppy, MD 01/30/12 [phone]

## 2012-01-30 NOTE — Discharge Summary (Signed)
Discharge Summary   Patient ID: Patricia Cuevas: [id number], DOB/AGE: [birth year] 61 y.o. Admit date: 01/29/2012 D/C date:     01/30/2012    Primary Care Physician:  VERTEFEUILLE,CYNTHIA K., NP, NP  Primary Cardiologist:  Dr. Zackery Barefoot  Primary Electrophysiologist:  None   Reason for Admission:  Chest Pain   Primary Discharge Diagnoses:   1.  Chest Pain  - Atypical;  Etiology not clear  - Plan outpatient stress testing to assess for ischemia   Secondary Discharge Diagnoses:   Past Medical History  Diagnosis Date  . Hx of CABG     2007  . Breast cancer     Double mastectomy 2007; s/p tamoxifen and arimidex therapy; no recurrence since 05/2008  . Hypertension   . Diabetes mellitus   . Ejection fraction     EF 65%, echo, [place], Jan 16, 2011  . Sleep apnea     CPAP being arranged May, 2011  . Dyslipidemia   . CAD (coronary artery disease)     Nuclear stress test Feb 11, 2011, EF 64%, no scar or ischemia    //         catheterization, November, 2011,patent LIMA-LAD-small caliber distal vessel with diffuse plaque, patent SVG to OM1 and OM 2, patent SVG to PDA and PLA, occluded SVG to diagonal, medical therapy  . Overweight     stomach stapling 1985 followed by weight loss, then return of weight  . Tobacco abuse     in the past, resolved  . Alcohol abuse     in the past, resolved  . Nausea & vomiting     hospitalization, November, 2011, stricture GE junction, questionable stenosis gastrojejunostomy, disruptive primary peristaltic wave, GE reflux, delayed emptying proximal gastric pouch  . Hx of colonic polyps   . Asthma   . Myocardial infarction      Procedures Performed This Admission:   None   Hospital Course: Patricia Cuevas is a 61 y.o. female with a h/o CAD, s/p CABG 2007, DM2, HTN, HL, tobacco abuse and ETOH abuse, breast CA, s/p bilat mastectomy, OSA and obesity.   She presented to the ED with complaints of chest pain with assoc headache, palpitations and  lightheadedness.  She was admitted for further eval and management.  Symptoms largely felt to be atypical.  Last myoview in 5/12 was low risk.  She subsequently ruled out of MI.  ECGs remained stable.  She was evaluated by Dr. Hillis Range on 01/30/2012 and felt stable for d/c to home.  She will need outpatient stress testing and f/u in the office.    Discharge Vitals: Blood pressure 127/69, pulse 71, temperature 98.2 F (36.8 C), temperature source Oral, resp. rate 14, SpO2 99.00%.  Labs: Lab Results  Component Value Date   WBC 4.8 01/30/2012   HGB 12.6 01/30/2012   HCT 38.8 01/30/2012   MCV 87.4 01/30/2012   PLT 178 01/30/2012     Lab 01/30/12 0354  NA 139  K 3.9  CL 104  CO2 25  BUN 8  CREATININE 0.68  CALCIUM 9.4  PROT --  BILITOT --  ALKPHOS --  ALT --  AST --  GLUCOSE 182*    Basename 01/30/12 0354 01/29/12 2035 01/29/12 1622  CKTOTAL 253* 278* 294*  CKMB 3.9 4.5* 5.0*  TROPONINI <0.30 <0.30 <0.30    Diagnostic Studies/Procedures:    Dg Chest 2 View  01/29/2012    IMPRESSION: No acute cardiopulmonary abnormality.  Original Report  Authenticated By: Harley Hallmark, M.D.    Discharge Medications   Medication List  As of 01/30/2012 12:42 PM   TAKE these medications         albuterol (2.5 MG/3ML) 0.083% nebulizer solution   Commonly known as: PROVENTIL   Take 2.5 mg by nebulization every 6 (six) hours as needed. Shortness of breath      albuterol 108 (90 BASE) MCG/ACT inhaler   Commonly known as: PROVENTIL HFA;VENTOLIN HFA   Inhale 2 puffs into the lungs every 6 (six) hours as needed. For shortness of breath      aspirin EC 81 MG tablet   Take 81 mg by mouth daily.      cetirizine 10 MG tablet   Commonly known as: ZYRTEC   Take 10 mg by mouth daily.      glipiZIDE 10 MG tablet   Commonly known as: GLUCOTROL   Take 10 mg by mouth daily.      Hydrocodone-Acetaminophen 10-300 MG Tabs   Take 1 tablet by mouth 2 (two) times daily as needed. For pain       lisinopril-hydrochlorothiazide 20-12.5 MG per tablet   Commonly known as: PRINZIDE,ZESTORETIC   Take 1 tablet by mouth daily.      metoCLOPramide 5 MG tablet   Commonly known as: REGLAN   Take 5 mg by mouth 2 (two) times daily.      nitroGLYCERIN 0.4 MG SL tablet   Commonly known as: NITROSTAT   Place 0.4 mg under the tongue every 5 (five) minutes as needed. For chest pain      RABEprazole 20 MG tablet   Commonly known as: ACIPHEX   Take 20 mg by mouth daily.      simvastatin 20 MG tablet   Commonly known as: ZOCOR   Take 20 mg by mouth daily.      sucralfate 1 GM/10ML suspension   Commonly known as: CARAFATE   Take 1 g by mouth 2 (two) times daily.            Disposition   The patient will be discharged in stable condition to home. Discharge Orders    Future Orders Please Complete By Expires   Diet - low sodium heart healthy      Diet Carb Modified      Increase activity slowly        Follow-up Information    Follow up with [facility] in 1 week. (office will call to arrange stress testing)    Contact information:   [address] [place] [address]       Follow up with Willa Rough, MD in 2 weeks. (office will call to schedule follow up with Dr. Myrtis Ser or Physician Assistant)    Contact information:   [address] [place] [address] [phone]            Duration of Discharge Encounter: Greater than 30 minutes including physician and PA time.  Luna Glasgow, PA-C  12:42 PM 01/30/2012        [address] [place], [place]  [address] Phone: [phone] Fax:  [phone]   I have seen, examined the patient, and reviewed the above assessment and plan.   Co Sign: Hillis Range, MD 01/30/2012 4:22 PM

## 2012-01-30 NOTE — Progress Notes (Signed)
SUBJECTIVE: The patient is doing well today.  At this time, she denies chest pain, shortness of breath, or any new concerns.  Her pain has resolved and she wants to go home.     [name] Kitchen aspirin  324 mg Oral Once  . aspirin EC  81 mg Oral Daily  . enoxaparin  40 mg Subcutaneous Q24H  . glipiZIDE  10 mg Oral QAC breakfast  . hydrochlorothiazide  12.5 mg Oral Daily  . lisinopril  20 mg Oral Daily  . loratadine  10 mg Oral Daily  . metoCLOPramide  5 mg Oral BID AC  . pantoprazole  40 mg Oral Q1200  . simvastatin  20 mg Oral Daily  . sodium chloride  3 mL Intravenous Q12H  . sucralfate  1 g Oral BID AC  . DISCONTD: lisinopril-hydrochlorothiazide  1 tablet Oral Daily  . DISCONTD: metoCLOPramide  5 mg Oral BID AC      OBJECTIVE: Physical Exam: Filed Vitals:   01/29/12 1413 01/29/12 1700 01/29/12 2200 01/30/12 0600  BP: 175/70 159/81 128/62 127/69  Pulse: 81 82 69 71  Temp:  97.5 F (36.4 C) 98.3 F (36.8 C) 98.2 F (36.8 C)  TempSrc:  Oral    Resp: 21 20 18 14   SpO2: 98% 97% 96% 99%    Intake/Output Summary (Last 24 hours) at 01/30/12 1044 Last data filed at 01/30/12 0900  Gross per 24 hour  Intake    240 ml  Output      0 ml  Net    240 ml    Telemetry reveals sinus rhythm  GEN- The patient is overweight appearing, alert and oriented x 3 today.   Head- normocephalic, atraumatic Eyes-  Sclera clear, conjunctiva pink Ears- hearing intact Oropharynx- clear Neck- supple, no JVP Lymph- no cervical lymphadenopathy Lungs- Clear to ausculation bilaterally, normal work of breathing Heart- Regular rate and rhythm, no murmurs, rubs or gallops, PMI not laterally displaced GI- soft, NT, ND, + BS Extremities- no clubbing, cyanosis, or edema   LABS: Basic Metabolic Panel:  Basename 01/30/12 0354 01/29/12 1623 01/29/12 1206  NA 139 -- 139  K 3.9 -- 3.9  CL 104 -- 103  CO2 25 -- 25  GLUCOSE 182* -- 149*  BUN 8 -- 8  CREATININE 0.68 0.67 --  CALCIUM 9.4 -- 9.0  MG -- -- --    PHOS -- -- --   Liver Function Tests: No results found for this basename: AST:2,ALT:2,ALKPHOS:2,BILITOT:2,PROT:2,ALBUMIN:2 in the last 72 hours No results found for this basename: LIPASE:2,AMYLASE:2 in the last 72 hours CBC:  Basename 01/30/12 0354 01/29/12 1623 01/29/12 1206  WBC 4.8 4.8 --  NEUTROABS -- -- 1.2*  HGB 12.6 12.8 --  HCT 38.8 39.5 --  MCV 87.4 87.6 --  PLT 178 191 --   Cardiac Enzymes:  Basename 01/30/12 0354 01/29/12 2035 01/29/12 1622  CKTOTAL 253* 278* 294*  CKMB 3.9 4.5* 5.0*  CKMBINDEX -- -- --  TROPONINI <0.30 <0.30 <0.30    ASSESSMENT AND PLAN:  Principal Problem:  *Chest pain Active Problems:  Hypertension  Diabetes mellitus  Dyslipidemia  Hx of CABG  OSA (obstructive sleep apnea)  CAD (coronary artery disease)  GERD (gastroesophageal reflux disease)  1. Atypical Chest pain - troponin negative 2. CAD  3. Type 2 DM  4. HTN  5. HL  6. OSA  - not on CPAP for the past 2 months.  7. History of breast cancer    Discharge to home today with outpatient  myoview.  We will continue her current medicines and make sure that she has slNTG at home.   Hillis Range, MD 01/30/2012 10:44 AM

## 2012-02-03 NOTE — Progress Notes (Signed)
Utilization review completed.  

## 2012-02-09 ENCOUNTER — Ambulatory Visit (HOSPITAL_COMMUNITY): Payer: Medicaid Other | Attending: Cardiology | Admitting: Radiology

## 2012-02-09 DIAGNOSIS — J45909 Unspecified asthma, uncomplicated: Secondary | ICD-10-CM | POA: Insufficient documentation

## 2012-02-09 DIAGNOSIS — E119 Type 2 diabetes mellitus without complications: Secondary | ICD-10-CM | POA: Insufficient documentation

## 2012-02-09 DIAGNOSIS — R002 Palpitations: Secondary | ICD-10-CM | POA: Insufficient documentation

## 2012-02-09 DIAGNOSIS — R42 Dizziness and giddiness: Secondary | ICD-10-CM | POA: Insufficient documentation

## 2012-02-09 DIAGNOSIS — Z8249 Family history of ischemic heart disease and other diseases of the circulatory system: Secondary | ICD-10-CM | POA: Insufficient documentation

## 2012-02-09 DIAGNOSIS — E785 Hyperlipidemia, unspecified: Secondary | ICD-10-CM | POA: Insufficient documentation

## 2012-02-09 DIAGNOSIS — Z87891 Personal history of nicotine dependence: Secondary | ICD-10-CM | POA: Insufficient documentation

## 2012-02-09 DIAGNOSIS — R079 Chest pain, unspecified: Secondary | ICD-10-CM | POA: Insufficient documentation

## 2012-02-09 DIAGNOSIS — Z951 Presence of aortocoronary bypass graft: Secondary | ICD-10-CM | POA: Insufficient documentation

## 2012-02-09 DIAGNOSIS — I2510 Atherosclerotic heart disease of native coronary artery without angina pectoris: Secondary | ICD-10-CM

## 2012-02-09 DIAGNOSIS — I10 Essential (primary) hypertension: Secondary | ICD-10-CM | POA: Insufficient documentation

## 2012-02-09 DIAGNOSIS — I252 Old myocardial infarction: Secondary | ICD-10-CM | POA: Insufficient documentation

## 2012-02-09 MED ORDER — TECHNETIUM TC 99M TETROFOSMIN IV KIT
33.0000 | PACK | Freq: Once | INTRAVENOUS | Status: AC | PRN
  Administered 2012-02-09: 33 via INTRAVENOUS

## 2012-02-09 MED ORDER — TECHNETIUM TC 99M TETROFOSMIN IV KIT
11.0000 | PACK | Freq: Once | INTRAVENOUS | Status: AC | PRN
  Administered 2012-02-09: 11 via INTRAVENOUS

## 2012-02-09 MED ORDER — REGADENOSON 0.4 MG/5ML IV SOLN
0.4000 mg | Freq: Once | INTRAVENOUS | Status: AC
  Administered 2012-02-09: 0.4 mg via INTRAVENOUS

## 2012-02-09 NOTE — Progress Notes (Signed)
[facility] SITE 3 NUCLEAR MED [address] [place] [address] [phone]  Cardiology Nuclear Med Study  Patricia Cuevas is a 61 y.o. female     MRN : [id number]     DOB: [birth year]  Procedure Date: 02/09/2012  Nuclear Med Background Indication for Stress Test:  Evaluation for Ischemia, Graft Patency and 01/29/12 Post Hospital: [facility]> Chest Pain, (-) enzymes History:  Asthma and '07MI-CABG x6,Stents prior to bypass, 07/2010 Heart Cath: occludded SVG-Diagonal other grafts patent Rx management 5/12 MPS: EF: 64% (-) ischemia -ECHO: EF: 65% @ [facility] Cardiac Risk Factors: Family History - CAD, History of Smoking, Hypertension, Lipids and NIDDM  Symptoms:  Chest Pain, Light-Headedness and Palpitations   Nuclear Pre-Procedure Caffeine/Decaff Intake:  None NPO After: 10:00pm   Lungs:  clear O2 Sat: 95% on room air. IV 0.9% NS with Angio Cath:  20g  IV Site: R Antecubital  IV Started by:  Stanton Kidney, EMT-P  Chest Size (in):  48 Cup Size: D  Height: 4\' 11"  (1.499 m)  Weight:  217 lb (98.431 kg)  BMI:  Body mass index is 43.83 kg/(m^2). Tech Comments:  NA    Nuclear Med Study 1 or 2 day study: 1 day  Stress Test Type:  Eugenie Birks  Reading MD: Cassell Clement, MD  Order Authorizing Provider:  J.Katz MD  Resting Radionuclide: Technetium 10m Tetrofosmin  Resting Radionuclide Dose: 11.0 mCi   Stress Radionuclide:  Technetium 41m Tetrofosmin  Stress Radionuclide Dose: 33.0 mCi           Stress Protocol Rest HR: 63 Stress HR: 107  Rest BP: 134/83 Stress BP: 148/89  Exercise Time (min): n/a METS: n/a   Predicted Max HR: 158 bpm % Max HR: 67.72 bpm Rate Pressure Product: 53664   Dose of Adenosine (mg):  n/a Dose of Lexiscan: 0.4 mg  Dose of Atropine (mg): n/a Dose of Dobutamine: n/a mcg/kg/min (at max HR)  Stress Test Technologist: Milana Na, EMT-P  Nuclear Technologist:  Domenic Polite, CNMT     Rest Procedure:  Myocardial perfusion imaging was performed at  rest 45 minutes following the intravenous administration of Technetium 64m Tetrofosmin. Rest ECG: Sinus Bradycardia  Stress Procedure:  The patient received IV Lexiscan 0.4 mg over 15-seconds.  Technetium 95m Tetrofosmin injected at 30-seconds.  There were non specific changes, fatigue, feels weird all over, and a rare pvc with Lexiscan.  Quantitative spect images were obtained after a 45 minute delay. Stress ECG: No significant change from baseline ECG  QPS Raw Data Images:  Normal; no motion artifact; normal heart/lung ratio. Stress Images:  Normal homogeneous uptake in all areas of the myocardium. Rest Images:  Normal homogeneous uptake in all areas of the myocardium. Subtraction (SDS):  No evidence of ischemia. Transient Ischemic Dilatation (Normal <1.22): 0.83 Lung/Heart Ratio (Normal <0.45):  0.39  Quantitative Gated Spect Images QGS EDV:  70 ml QGS ESV:  24 ml  Impression Exercise Capacity:  Lexiscan with no exercise. BP Response:  Normal blood pressure response. Clinical Symptoms:  No chest pain. ECG Impression:  No significant ST segment change suggestive of ischemia. Comparison with Prior Nuclear Study: No significant change from previous study  Overall Impression:  Normal stress nuclear study.  LV Ejection Fraction: 66%.  LV Wall Motion:  NL LV Function; NL Wall Motion  [employer]

## 2012-02-16 ENCOUNTER — Encounter: Payer: Self-pay | Admitting: Cardiology

## 2012-02-17 ENCOUNTER — Ambulatory Visit (INDEPENDENT_AMBULATORY_CARE_PROVIDER_SITE_OTHER): Payer: Medicaid Other | Admitting: Cardiology

## 2012-02-17 ENCOUNTER — Encounter: Payer: Self-pay | Admitting: Cardiology

## 2012-02-17 VITALS — BP 110/80 | HR 76 | Ht 59.0 in | Wt 217.0 lb

## 2012-02-17 DIAGNOSIS — I2510 Atherosclerotic heart disease of native coronary artery without angina pectoris: Secondary | ICD-10-CM

## 2012-02-17 DIAGNOSIS — R9430 Abnormal result of cardiovascular function study, unspecified: Secondary | ICD-10-CM

## 2012-02-17 DIAGNOSIS — R0989 Other specified symptoms and signs involving the circulatory and respiratory systems: Secondary | ICD-10-CM

## 2012-02-17 DIAGNOSIS — E785 Hyperlipidemia, unspecified: Secondary | ICD-10-CM

## 2012-02-17 DIAGNOSIS — I10 Essential (primary) hypertension: Secondary | ICD-10-CM

## 2012-02-17 NOTE — Assessment & Plan Note (Signed)
Lipids are being treated. No change in therapy. 

## 2012-02-17 NOTE — Assessment & Plan Note (Signed)
Coronary disease is stable. Her recent nuclear scan reveals no obvious ischemia. No further workup.

## 2012-02-17 NOTE — Patient Instructions (Signed)
Your physician wants you to follow-up in:  12 months.  You will receive a reminder letter in the mail two months in advance. If you don't receive a letter, please call our office to schedule the follow-up appointment.   

## 2012-02-17 NOTE — Assessment & Plan Note (Addendum)
LV function continues to be normal. Ejection fraction was 66% by her nuclear scan on Feb 09, 2012.

## 2012-02-17 NOTE — Assessment & Plan Note (Signed)
Blood pressure is controlled. No change in therapy. 

## 2012-02-17 NOTE — Progress Notes (Signed)
HPI  Patient is seen to followup coronary disease. She is also seen to followup a hospitalization. The patient has known coronary disease. She had some chest pain and was admitted to the hospital on Jan 29, 2012. I have reviewed the discharge summary. I have reviewed the lab work. The patient had a followup outpatient nuclear stress study. This was done May, 2013. Ejection fraction is normal. There was no significant ischemia.  Patient returns today. She's not having any chest pain. She does have some exertional shortness of breath. She is overweight.  No Known Allergies  Current Outpatient Prescriptions  Medication Sig Dispense Refill  . albuterol (PROVENTIL HFA;VENTOLIN HFA) 108 (90 BASE) MCG/ACT inhaler Inhale 2 puffs into the lungs every 6 (six) hours as needed. For shortness of breath      . albuterol (PROVENTIL) (2.5 MG/3ML) 0.083% nebulizer solution Take 2.5 mg by nebulization every 6 (six) hours as needed. Shortness of breath      . aspirin EC 81 MG tablet Take 81 mg by mouth daily.      . cetirizine (ZYRTEC) 10 MG tablet Take 10 mg by mouth daily.      [name] Kitchen glipiZIDE (GLUCOTROL) 10 MG tablet Take 10 mg by mouth daily.      . Hydrocodone-Acetaminophen 10-300 MG TABS Take 1 tablet by mouth 2 (two) times daily as needed. For pain      . lisinopril-hydrochlorothiazide (PRINZIDE,ZESTORETIC) 20-12.5 MG per tablet Take 1 tablet by mouth daily.        . metoCLOPramide (REGLAN) 5 MG tablet Take 5 mg by mouth 2 (two) times daily.      . nitroGLYCERIN (NITROSTAT) 0.4 MG SL tablet Place 0.4 mg under the tongue every 5 (five) minutes as needed. For chest pain      . RABEprazole (ACIPHEX) 20 MG tablet Take 20 mg by mouth daily.      . simvastatin (ZOCOR) 20 MG tablet Take 20 mg by mouth daily.      . sucralfate (CARAFATE) 1 GM/10ML suspension Take 1 g by mouth 2 (two) times daily.        History   Social History  . Marital Status: Single    Spouse Name: N/A    Number of Children: N/A  .  Years of Education: N/A   Occupational History  . Disabled    Social History Main Topics  . Smoking status: Former Smoker -- 3.0 packs/day for 27 years    Types: Cigarettes    Quit date: 09/14/2000  . Smokeless tobacco: Never Used  . Alcohol Use: No  . Drug Use: No  . Sexually Active: No   Other Topics Concern  . Not on file   Social History Narrative  . No narrative on file    Family History  Problem Relation Age of Onset  . Hypertension Mother   . Heart disease Mother   . Diabetes Mother   . Heart disease Father   . Heart disease Sister   . Stomach cancer Sister   . Asthma Sister   . Colon cancer Father     Past Medical History  Diagnosis Date  . Hx of CABG     2007  . Breast cancer     Double mastectomy 2007; s/p tamoxifen and arimidex therapy; no recurrence since 05/2008  . Hypertension   . Diabetes mellitus   . Ejection fraction     EF 65%, echo, [place], Jan 16, 2011  . Sleep apnea  CPAP being arranged May, 2011  . Dyslipidemia   . CAD (coronary artery disease)     Nuclear stress test Feb 11, 2011, EF 64%, no scar or ischemia    //         catheterization, November, 2011,patent LIMA-LAD-small caliber distal vessel with diffuse plaque, patent SVG to OM1 and OM 2, patent SVG to PDA and PLA, occluded SVG to diagonal, medical therapy  . Overweight     stomach stapling 1985 followed by weight loss, then return of weight  . Tobacco abuse     in the past, resolved  . Alcohol abuse     in the past, resolved  . Nausea & vomiting     hospitalization, November, 2011, stricture GE junction, questionable stenosis gastrojejunostomy, disruptive primary peristaltic wave, GE reflux, delayed emptying proximal gastric pouch  . Hx of colonic polyps   . Asthma   . Myocardial infarction     Past Surgical History  Procedure Date  . Coronary artery bypass graft   . Mastectomy   . Coronary stent placement   . Abdominal hysterectomy     ROS  Patient denies  fever, chills, headache, sweats, rash, change in vision, change in hearing, chest pain, cough, nausea vomiting, urinary symptoms. All other systems are reviewed and are negative.  PHYSICAL EXAM Patient is overweight. She is stable. There is no jugulovenous distention. Lungs are clear. Respiratory effort is nonlabored. Cardiac exam reveals S1 and S2. There no clicks or significant murmurs. The abdomen is soft. There is no peripheral edema. There no musculoskeletal deformities. There are no skin rashes.  Filed Vitals:   02/17/12 1049  BP: 110/80  Pulse: 76  Height: 4\' 11"  (1.499 m)  Weight: 217 lb (98.431 kg)     ASSESSMENT & PLAN

## 2012-05-18 ENCOUNTER — Telehealth: Payer: Self-pay | Admitting: *Deleted

## 2012-05-18 NOTE — Telephone Encounter (Signed)
Walk in form received at 9:50 for her with c/o "pain" written and "need to see doctor."  Told [name] she does not know who her doctor is.  Reviewed Epic and [facility] records.  Patient seen last December 03, 2011 by [facility] and has been released to PCP after being followed for adenocarcinoma of right breast.  Has been released to PCP.  [name] reports coming here because of "stool problems".  Asked about pain she wrote on walk in form and admits to having pain with stools.  Has recently been evaluated with stool cards and learned she has blood in her stool.  Was told to come here after being asked what doctors she has seen in the past.  Asked who her PCP is and was given name Ellison Hughs who this nurse is not familiar with.  Asked if she's had a colonoscopy or other GI test.  Denies colonoscopy.  Instructed to call PCP and ask for GI consult.    [name] says she can't afford the bras for her breast and this nurse told her to have PCP write a prescription for Bra's and prosthesis.  She asked for the names of providers she has seen at [facility] to be written.  Left to get this information and patient no longer in the lobby.  [facility], restrooms, [place] and halls.  [name] has left the building.

## 2012-05-18 NOTE — Telephone Encounter (Signed)
Called [name] at home number.  Explained that I came back to lobby with information she requested and she had left.  Asked if she still wanted providers names and she does.  Confirmed home address and will mail last two office notes.  Reviewed our discussion earlier today.  Asked who Dr. Dell Ponto is and she has "seen him once at [facility] [address] Office but usually see's Arline Asp who is a NP.  Instructed to have MD make GI referral and order bras.

## 2012-06-08 ENCOUNTER — Emergency Department (HOSPITAL_COMMUNITY)
Admission: EM | Admit: 2012-06-08 | Discharge: 2012-06-08 | Disposition: A | Discharge status: Home / Self Care | Payer: Medicaid Other | Attending: Emergency Medicine | Admitting: Emergency Medicine

## 2012-06-08 ENCOUNTER — Emergency Department (HOSPITAL_COMMUNITY): Payer: Medicaid Other

## 2012-06-08 ENCOUNTER — Encounter (HOSPITAL_COMMUNITY): Payer: Self-pay | Admitting: Emergency Medicine

## 2012-06-08 DIAGNOSIS — J45901 Unspecified asthma with (acute) exacerbation: Secondary | ICD-10-CM | POA: Insufficient documentation

## 2012-06-08 DIAGNOSIS — E785 Hyperlipidemia, unspecified: Secondary | ICD-10-CM | POA: Insufficient documentation

## 2012-06-08 DIAGNOSIS — G4730 Sleep apnea, unspecified: Secondary | ICD-10-CM | POA: Insufficient documentation

## 2012-06-08 DIAGNOSIS — I2510 Atherosclerotic heart disease of native coronary artery without angina pectoris: Secondary | ICD-10-CM | POA: Insufficient documentation

## 2012-06-08 DIAGNOSIS — Z853 Personal history of malignant neoplasm of breast: Secondary | ICD-10-CM | POA: Insufficient documentation

## 2012-06-08 DIAGNOSIS — Z951 Presence of aortocoronary bypass graft: Secondary | ICD-10-CM | POA: Insufficient documentation

## 2012-06-08 DIAGNOSIS — Z87891 Personal history of nicotine dependence: Secondary | ICD-10-CM | POA: Insufficient documentation

## 2012-06-08 DIAGNOSIS — I252 Old myocardial infarction: Secondary | ICD-10-CM | POA: Insufficient documentation

## 2012-06-08 DIAGNOSIS — I10 Essential (primary) hypertension: Secondary | ICD-10-CM | POA: Insufficient documentation

## 2012-06-08 DIAGNOSIS — J329 Chronic sinusitis, unspecified: Secondary | ICD-10-CM | POA: Insufficient documentation

## 2012-06-08 DIAGNOSIS — E119 Type 2 diabetes mellitus without complications: Secondary | ICD-10-CM | POA: Insufficient documentation

## 2012-06-08 DIAGNOSIS — Z79899 Other long term (current) drug therapy: Secondary | ICD-10-CM | POA: Insufficient documentation

## 2012-06-08 MED ORDER — LEVOFLOXACIN 500 MG PO TABS: 500.0000 mg | ORAL_TABLET | Freq: Every day | ORAL | Status: DC

## 2012-06-08 MED ORDER — ALBUTEROL SULFATE HFA 108 (90 BASE) MCG/ACT IN AERS: 2.0000 | INHALATION_SPRAY | RESPIRATORY_TRACT | Status: DC | PRN

## 2012-06-08 MED ORDER — ALBUTEROL SULFATE (5 MG/ML) 0.5% IN NEBU
5.0000 mg | INHALATION_SOLUTION | Freq: Once | RESPIRATORY_TRACT | Status: AC
Start: 2012-06-08 — End: 2012-06-08
  Administered 2012-06-08: 5 mg via RESPIRATORY_TRACT
  Filled 2012-06-08: qty 1

## 2012-06-08 MED ORDER — PREDNISONE 20 MG PO TABS: 40.0000 mg | ORAL_TABLET | Freq: Every day | ORAL | Status: DC

## 2012-06-08 NOTE — ED Notes (Signed)
PT. REPORTS PROGRESSING SOB WITH WHEEZING , PRODUCTIVE COUGH AND CHEST CONGESTION ONSET LAST NIGHT UNRELIEVED BY MDI .

## 2012-06-08 NOTE — ED Provider Notes (Signed)
Medical screening examination/treatment/procedure(s) were conducted as a shared visit with non-physician practitioner(s) and myself.  I personally evaluated the patient during the encounter  Please see my separate respective documentation pertaining to this patient encounter   Vida Roller, MD 06/08/12 [phone]

## 2012-06-08 NOTE — ED Provider Notes (Signed)
61 year old female with a history of asthma presents with a complaint of approximately 36-48 hours of coughing or shortness of breath and wheezing. The cough is associated with a productive phlegm which is yellow and thick. She also has a sinus headache, sinus drainage, post nasal drip and a mild sore throat with a right-sided earache. She denies fevers or chills, she has a sick contact in her daughter who is at a recent community acquired pneumonia and was treated in the hospital. The patient is currently not taking antibiotics. She has approximately one to 2 episodes of asthma flareup per year and has been using medications at home until recently, she has run out of her medications and that nothing for breakthrough shortness of breath. She denies swelling in her legs, abdominal pain, back pain, rashes, numbness, weakness, dysuria, diarrhea.  Physical exam: The patient appears in no acute distress, she has a mild tachypnea at approximately 18-20 breaths per minute, clear lung sounds without any increased work of breathing. She has mild expiratory wheezing on forced expiration but no rales, no rhonchi and speaks in full sentences without accessory muscle use.  Lower extremities show no signs of edema, strong radial artery pulses, no rashes, normal mental status.  The patient has improved significantly after taking albuterol nebulizer therapy, her chest x-ray does not reveal focal infiltrate but does have bilateral lower lobe atelectasis. I personally interpreted this x-ray and agree with the radiologist interpretation of bilateral atelectasis. She appears stable, will put her on antibiotics for sinusitis and possibly an early community-acquired pneumonia or bronchitis. Levaquin, albuterol inhaler refill, followup with her family Dr. in 2 days if no improvement. I discussed the signs with the patient and she is in agreement with the plan.  Medical screening examination/treatment/procedure(s) were conducted as a  shared visit with non-physician practitioner(s) and myself.  I personally evaluated the patient during the encounter    Vida Roller, MD 06/08/12 [phone]

## 2012-06-08 NOTE — ED Notes (Signed)
Patient transported to X-ray 

## 2012-06-08 NOTE — ED Provider Notes (Signed)
History     CSN: [id number]  Arrival date & time 06/08/12  0153   First MD Initiated Contact with Patient 06/08/12 0202      No chief complaint on file.   (Consider location/radiation/quality/duration/timing/severity/associated sxs/prior treatment) HPI Comments: This is a 61 year old female who presents to the emergency department this evening after having an asthma exacerbation. She states that starting last night her asthma and was no longer controlled with her albuterol inhaler. She states that she has developed chest congestion and a cold since Monday. She also complains of coughing up yellow sputum x 2 days.   The history is provided by the patient. No language interpreter was used.    Past Medical History  Diagnosis Date  . Hx of CABG     2007  . Breast cancer     Double mastectomy 2007; s/p tamoxifen and arimidex therapy; no recurrence since 05/2008  . Hypertension   . Diabetes mellitus   . Ejection fraction     EF 65%, echo, [place], Jan 16, 2011  . Sleep apnea     CPAP being arranged May, 2011  . Dyslipidemia   . CAD (coronary artery disease)     Nuclear stress test Feb 11, 2011, EF 64%, no scar or ischemia    //         catheterization, November, 2011,patent LIMA-LAD-small caliber distal vessel with diffuse plaque, patent SVG to OM1 and OM 2, patent SVG to PDA and PLA, occluded SVG to diagonal, medical therapy  . Overweight     stomach stapling 1985 followed by weight loss, then return of weight  . Tobacco abuse     in the past, resolved  . Alcohol abuse     in the past, resolved  . Nausea & vomiting     hospitalization, November, 2011, stricture GE junction, questionable stenosis gastrojejunostomy, disruptive primary peristaltic wave, GE reflux, delayed emptying proximal gastric pouch  . Hx of colonic polyps   . Asthma   . Myocardial infarction     Past Surgical History  Procedure Date  . Coronary artery bypass graft   . Mastectomy   . Coronary stent  placement   . Abdominal hysterectomy     Family History  Problem Relation Age of Onset  . Hypertension Mother   . Heart disease Mother   . Diabetes Mother   . Heart disease Father   . Heart disease Sister   . Stomach cancer Sister   . Asthma Sister   . Colon cancer Father     History  Substance Use Topics  . Smoking status: Former Smoker -- 3.0 packs/day for 27 years    Types: Cigarettes    Quit date: 09/14/2000  . Smokeless tobacco: Never Used  . Alcohol Use: No    OB History    Grav Para Term Preterm Abortions TAB SAB Ect Mult Living                  Review of Systems  Constitutional: Positive for chills. Negative for fever.  HENT: Positive for congestion.   Respiratory: Positive for cough.   Cardiovascular: Negative for chest pain.  Gastrointestinal: Negative for nausea, vomiting and diarrhea.  Musculoskeletal: Positive for myalgias.  All other systems reviewed and are negative.    Allergies  Review of patient's allergies indicates no known allergies.  Home Medications   Current Outpatient Rx  Name Route Sig Dispense Refill  . ALBUTEROL SULFATE HFA 108 (90 BASE) MCG/ACT IN AERS  Inhalation Inhale 2 puffs into the lungs every 6 (six) hours as needed. For shortness of breath    . ALBUTEROL SULFATE (2.5 MG/3ML) 0.083% IN NEBU Nebulization Take 2.5 mg by nebulization every 6 (six) hours as needed. Shortness of breath    . ASPIRIN EC 81 MG PO TBEC Oral Take 81 mg by mouth daily.    [name] Kitchen CETIRIZINE HCL 10 MG PO TABS Oral Take 10 mg by mouth daily.    [name] Kitchen GLIPIZIDE 10 MG PO TABS Oral Take 10 mg by mouth daily.    [name] Kitchen HYDROCODONE-ACETAMINOPHEN 10-300 MG PO TABS Oral Take 1 tablet by mouth 2 (two) times daily as needed. For pain    . LISINOPRIL-HYDROCHLOROTHIAZIDE 20-12.5 MG PO TABS Oral Take 1 tablet by mouth daily.      [name] Kitchen METOCLOPRAMIDE HCL 5 MG PO TABS Oral Take 5 mg by mouth 2 (two) times daily.    [name] Kitchen NITROGLYCERIN 0.4 MG SL SUBL Sublingual Place 0.4 mg under the tongue  every 5 (five) minutes as needed. For chest pain    . RABEPRAZOLE SODIUM 20 MG PO TBEC Oral Take 20 mg by mouth daily.    [name] Kitchen SIMVASTATIN 20 MG PO TABS Oral Take 20 mg by mouth daily.    . SUCRALFATE 1 GM/10ML PO SUSP Oral Take 1 g by mouth 2 (two) times daily.      BP 165/92  Pulse 77  Temp 98.6 F (37 C) (Oral)  Resp 18  SpO2 95%  Physical Exam  Nursing note and vitals reviewed. Constitutional: She is oriented to person, place, and time. She appears well-developed and well-nourished.  HENT:  Head: Normocephalic and atraumatic.  Eyes: Conjunctivae normal and EOM are normal. Pupils are equal, round, and reactive to light.  Neck: Normal range of motion. Neck supple.  Cardiovascular: Normal rate, regular rhythm and normal heart sounds.   Pulmonary/Chest: No respiratory distress. She has wheezes.  Abdominal: Soft. Bowel sounds are normal.  Musculoskeletal: Normal range of motion.  Neurological: She is alert and oriented to person, place, and time.  Skin: Skin is warm and dry.  Psychiatric: She has a normal mood and affect. Her behavior is normal. Judgment and thought content normal.    ED Course  Procedures (including critical care time)   Dg Chest 2 View  06/08/2012  *RADIOLOGY REPORT*  Clinical Data: Chest pain.  Congestion.  CHEST - 2 VIEW  Comparison: 01/29/2012.  Findings: Elevation of the right hemidiaphragm.  CABG.  Right axillary dissection.  Right greater than left basilar atelectasis. No airspace disease.  Postoperative changes in the left upper quadrant compatible with prior gastric surgery cardiopericardial silhouette within normal limits for size.  Mild tortuosity of the thoracic aorta.  IMPRESSION: Stable appearance of the chest with bilateral basilar atelectasis. CABG, prior gastric surgery and right axillary dissection.  No acute cardiopulmonary disease.   Original Report Authenticated By: Andreas Newport, M.D.        1. Sinusitis   2. Asthma exacerbation        MDM  61 year old female with acute asthma exacerbation.  Symptoms improved greatly following ED nebulizer treatment.  Also has sinusitis which will be treated on an outpatient basis.  Will instruct patient to follow-up with her PCP if no improvement in 3 days.   This patient was discussed with Dr. Hyacinth Meeker, who agrees with the treatment plan.        Roxy Horseman, PA-C 06/08/12 [phone]

## 2012-07-18 ENCOUNTER — Ambulatory Visit (INDEPENDENT_AMBULATORY_CARE_PROVIDER_SITE_OTHER): Payer: Medicaid Other | Admitting: Cardiology

## 2012-07-18 ENCOUNTER — Encounter: Payer: Self-pay | Admitting: Cardiology

## 2012-07-18 VITALS — BP 150/88 | HR 79 | Ht 59.0 in | Wt 221.0 lb

## 2012-07-18 DIAGNOSIS — I2510 Atherosclerotic heart disease of native coronary artery without angina pectoris: Secondary | ICD-10-CM

## 2012-07-18 DIAGNOSIS — I10 Essential (primary) hypertension: Secondary | ICD-10-CM

## 2012-07-18 NOTE — Progress Notes (Signed)
Patient ID: Patricia Cuevas, female   DOB: [birth year], 61 y.o.   MRN: [id number]   HPI  Patient is seen in followup overall cardiac status. She is stable. She does have a history of coronary disease post CABG. When I saw her in June she was stable after a nuclear scan in May, 2013.  No Known Allergies  Current Outpatient Prescriptions  Medication Sig Dispense Refill  . albuterol (PROVENTIL HFA;VENTOLIN HFA) 108 (90 BASE) MCG/ACT inhaler Inhale 2 puffs into the lungs every 6 (six) hours as needed. For shortness of breath      . albuterol (PROVENTIL HFA;VENTOLIN HFA) 108 (90 BASE) MCG/ACT inhaler Inhale 2 puffs into the lungs every 4 (four) hours as needed for wheezing or shortness of breath.  1 Inhaler  3  . aspirin EC 81 MG tablet Take 81 mg by mouth daily.      [name] Kitchen glipiZIDE (GLUCOTROL) 10 MG tablet Take 10 mg by mouth daily.      . Hydrocodone-Acetaminophen 10-300 MG TABS Take 1 tablet by mouth 2 (two) times daily as needed. For pain      . levofloxacin (LEVAQUIN) 500 MG tablet Take 1 tablet (500 mg total) by mouth daily.  7 tablet  0  . lisinopril-hydrochlorothiazide (PRINZIDE,ZESTORETIC) 20-12.5 MG per tablet Take 1 tablet by mouth daily.        . nitroGLYCERIN (NITROSTAT) 0.4 MG SL tablet Place 0.4 mg under the tongue every 5 (five) minutes as needed. For chest pain      . predniSONE (DELTASONE) 20 MG tablet Take 2 tablets (40 mg total) by mouth daily.  10 tablet  0  . RABEprazole (ACIPHEX) 20 MG tablet Take 20 mg by mouth daily.      . simvastatin (ZOCOR) 20 MG tablet Take 20 mg by mouth daily.      . sucralfate (CARAFATE) 1 GM/10ML suspension Take 1 g by mouth 2 (two) times daily.        History   Social History  . Marital Status: Single    Spouse Name: N/A    Number of Children: N/A  . Years of Education: N/A   Occupational History  . Disabled    Social History Main Topics  . Smoking status: Former Smoker -- 3.0 packs/day for 27 years    Types: Cigarettes    Quit date:  09/14/2000  . Smokeless tobacco: Never Used  . Alcohol Use: No  . Drug Use: No  . Sexually Active: Not on file   Other Topics Concern  . Not on file   Social History Narrative  . No narrative on file    Family History  Problem Relation Age of Onset  . Hypertension Mother   . Heart disease Mother   . Diabetes Mother   . Heart disease Father   . Heart disease Sister   . Stomach cancer Sister   . Asthma Sister   . Colon cancer Father     Past Medical History  Diagnosis Date  . Hx of CABG     2007  . Breast cancer     Double mastectomy 2007; s/p tamoxifen and arimidex therapy; no recurrence since 05/2008  . Hypertension   . Diabetes mellitus   . Ejection fraction     EF 65%, echo, [place], Jan 16, 2011  . Sleep apnea     CPAP being arranged May, 2011  . Dyslipidemia   . CAD (coronary artery disease)     Nuclear stress test Feb 11, 2011, EF 64%, no scar or ischemia    //         catheterization, November, 2011,patent LIMA-LAD-small caliber distal vessel with diffuse plaque, patent SVG to OM1 and OM 2, patent SVG to PDA and PLA, occluded SVG to diagonal, medical therapy  . Overweight     stomach stapling 1985 followed by weight loss, then return of weight  . Tobacco abuse     in the past, resolved  . Alcohol abuse     in the past, resolved  . Nausea & vomiting     hospitalization, November, 2011, stricture GE junction, questionable stenosis gastrojejunostomy, disruptive primary peristaltic wave, GE reflux, delayed emptying proximal gastric pouch  . Hx of colonic polyps   . Asthma   . Myocardial infarction     Past Surgical History  Procedure Date  . Coronary artery bypass graft   . Mastectomy   . Coronary stent placement   . Abdominal hysterectomy     Patient Active Problem List  Diagnosis  . Breast cancer  . Hypertension  . Diabetes mellitus  . Dyslipidemia  . Ejection fraction  . Hx of CABG  . Overweight  . OSA (obstructive sleep apnea)  . Nausea &  vomiting  . CAD (coronary artery disease)  . GERD (gastroesophageal reflux disease)  . Chest pain    ROS   Patient denies fever, chills, headache, sweats, rash, change in vision, change in hearing, chest pain, cough, nausea vomiting, urinary symptoms. All other systems are reviewed and are negative.  PHYSICAL EXAM  Patient is overweight is stable. There is no jugulovenous distention. Lungs are clear. Respiratory effort is nonlabored. Cardiac exam reveals S1 and S2. There no clicks or significant murmurs. The abdomen is soft. There is no peripheral edema.  Filed Vitals:   07/18/12 1656  BP: 150/88  Pulse: 79  Height: 4\' 11"  (1.499 m)  Weight: 221 lb (100.245 kg)  SpO2: 97%   EKG is done today and reviewed by me. There sinus rhythm. There are nonspecific ST-T wave changes. There is no significant change.  ASSESSMENT & PLAN

## 2012-07-18 NOTE — Patient Instructions (Addendum)
Your physician wants you to follow-up in: 1 year  You will receive a reminder letter in the mail two months in advance. If you don't receive a letter, please call our office to schedule the follow-up appointment.  Your physician recommends that you continue on your current medications as directed. Please refer to the Current Medication list given to you today.  

## 2012-07-18 NOTE — Assessment & Plan Note (Signed)
Coronary disease is stable. No further workup. I'll see her back in one year.

## 2012-07-18 NOTE — Assessment & Plan Note (Signed)
Systolic blood pressures mildly elevated. No change in her meds today. She ate pork recently. I've talked with her about watching her salt intake.

## 2012-07-27 ENCOUNTER — Inpatient Hospital Stay (HOSPITAL_COMMUNITY)
Admission: EM | Admit: 2012-07-27 | Discharge: 2012-07-28 | DRG: 153 | Disposition: A | Discharge status: Home / Self Care | Payer: Medicaid Other | Attending: Internal Medicine | Admitting: Internal Medicine

## 2012-07-27 ENCOUNTER — Emergency Department (HOSPITAL_COMMUNITY): Payer: Medicaid Other

## 2012-07-27 ENCOUNTER — Encounter (HOSPITAL_COMMUNITY): Payer: Self-pay | Admitting: Emergency Medicine

## 2012-07-27 DIAGNOSIS — J069 Acute upper respiratory infection, unspecified: Principal | ICD-10-CM | POA: Diagnosis present

## 2012-07-27 DIAGNOSIS — Z6841 Body Mass Index (BMI) 40.0 and over, adult: Secondary | ICD-10-CM

## 2012-07-27 DIAGNOSIS — B9789 Other viral agents as the cause of diseases classified elsewhere: Secondary | ICD-10-CM | POA: Diagnosis present

## 2012-07-27 DIAGNOSIS — Z860100 Personal history of colon polyps, unspecified: Secondary | ICD-10-CM

## 2012-07-27 DIAGNOSIS — E785 Hyperlipidemia, unspecified: Secondary | ICD-10-CM | POA: Diagnosis present

## 2012-07-27 DIAGNOSIS — Z90710 Acquired absence of both cervix and uterus: Secondary | ICD-10-CM

## 2012-07-27 DIAGNOSIS — R05 Cough: Secondary | ICD-10-CM

## 2012-07-27 DIAGNOSIS — R509 Fever, unspecified: Secondary | ICD-10-CM

## 2012-07-27 DIAGNOSIS — K219 Gastro-esophageal reflux disease without esophagitis: Secondary | ICD-10-CM | POA: Diagnosis present

## 2012-07-27 DIAGNOSIS — Z9010 Acquired absence of unspecified breast and nipple: Secondary | ICD-10-CM

## 2012-07-27 DIAGNOSIS — R350 Frequency of micturition: Secondary | ICD-10-CM | POA: Diagnosis present

## 2012-07-27 DIAGNOSIS — Z792 Long term (current) use of antibiotics: Secondary | ICD-10-CM

## 2012-07-27 DIAGNOSIS — Z7982 Long term (current) use of aspirin: Secondary | ICD-10-CM

## 2012-07-27 DIAGNOSIS — Z9861 Coronary angioplasty status: Secondary | ICD-10-CM

## 2012-07-27 DIAGNOSIS — R6510 Systemic inflammatory response syndrome (SIRS) of non-infectious origin without acute organ dysfunction: Secondary | ICD-10-CM | POA: Diagnosis present

## 2012-07-27 DIAGNOSIS — I252 Old myocardial infarction: Secondary | ICD-10-CM

## 2012-07-27 DIAGNOSIS — Z9884 Bariatric surgery status: Secondary | ICD-10-CM

## 2012-07-27 DIAGNOSIS — Z951 Presence of aortocoronary bypass graft: Secondary | ICD-10-CM

## 2012-07-27 DIAGNOSIS — Z853 Personal history of malignant neoplasm of breast: Secondary | ICD-10-CM

## 2012-07-27 DIAGNOSIS — Z79899 Other long term (current) drug therapy: Secondary | ICD-10-CM

## 2012-07-27 DIAGNOSIS — E119 Type 2 diabetes mellitus without complications: Secondary | ICD-10-CM | POA: Diagnosis present

## 2012-07-27 DIAGNOSIS — Z87891 Personal history of nicotine dependence: Secondary | ICD-10-CM

## 2012-07-27 DIAGNOSIS — F1011 Alcohol abuse, in remission: Secondary | ICD-10-CM | POA: Diagnosis present

## 2012-07-27 DIAGNOSIS — I25119 Atherosclerotic heart disease of native coronary artery with unspecified angina pectoris: Secondary | ICD-10-CM | POA: Diagnosis present

## 2012-07-27 DIAGNOSIS — I2510 Atherosclerotic heart disease of native coronary artery without angina pectoris: Secondary | ICD-10-CM | POA: Diagnosis present

## 2012-07-27 DIAGNOSIS — J45909 Unspecified asthma, uncomplicated: Secondary | ICD-10-CM | POA: Diagnosis present

## 2012-07-27 DIAGNOSIS — R3915 Urgency of urination: Secondary | ICD-10-CM | POA: Diagnosis present

## 2012-07-27 DIAGNOSIS — I10 Essential (primary) hypertension: Secondary | ICD-10-CM | POA: Diagnosis present

## 2012-07-27 DIAGNOSIS — J9819 Other pulmonary collapse: Secondary | ICD-10-CM | POA: Diagnosis present

## 2012-07-27 DIAGNOSIS — R000 Tachycardia, unspecified: Secondary | ICD-10-CM | POA: Diagnosis present

## 2012-07-27 DIAGNOSIS — R079 Chest pain, unspecified: Secondary | ICD-10-CM | POA: Diagnosis present

## 2012-07-27 DIAGNOSIS — G4730 Sleep apnea, unspecified: Secondary | ICD-10-CM | POA: Diagnosis present

## 2012-07-27 LAB — CBC WITH DIFFERENTIAL/PLATELET
Basophils Absolute: 0 10*3/uL (ref 0.0–0.1)
HCT: 41.9 % (ref 36.0–46.0)
Hemoglobin: 14 g/dL (ref 12.0–15.0)
Lymphocytes Relative: 21 % (ref 12–46)
Lymphs Abs: 2.3 10*3/uL (ref 0.7–4.0)
MCV: 84.8 fL (ref 78.0–100.0)
Monocytes Absolute: 1.4 10*3/uL — ABNORMAL HIGH (ref 0.1–1.0)
Monocytes Relative: 12 % (ref 3–12)
Neutro Abs: 7.3 10*3/uL (ref 1.7–7.7)
RBC: 4.94 MIL/uL (ref 3.87–5.11)
WBC: 11.1 10*3/uL — ABNORMAL HIGH (ref 4.0–10.5)

## 2012-07-27 LAB — POCT I-STAT TROPONIN I

## 2012-07-27 MED ORDER — DEXTROSE 5 % IV SOLN
500.0000 mg | Freq: Once | INTRAVENOUS | Status: AC
  Administered 2012-07-27: 500 mg via INTRAVENOUS
  Filled 2012-07-27: qty 500

## 2012-07-27 MED ORDER — ALBUTEROL SULFATE (5 MG/ML) 0.5% IN NEBU
5.0000 mg | INHALATION_SOLUTION | Freq: Once | RESPIRATORY_TRACT | Status: AC
  Administered 2012-07-27: 5 mg via RESPIRATORY_TRACT
  Filled 2012-07-27: qty 1

## 2012-07-27 MED ORDER — DEXTROSE 5 % IV SOLN
1.0000 g | Freq: Once | INTRAVENOUS | Status: AC
  Administered 2012-07-27: 1 g via INTRAVENOUS
  Filled 2012-07-27: qty 10

## 2012-07-27 MED ORDER — ACETAMINOPHEN 325 MG PO TABS
1000.0000 mg | ORAL_TABLET | Freq: Once | ORAL | Status: AC
  Administered 2012-07-27: 975 mg via ORAL
  Filled 2012-07-27: qty 3

## 2012-07-27 NOTE — ED Notes (Signed)
Pt. States that she has had chest pain for 1 week.  Has not been feeling good.  Pt. Appears SOB and weak.  N/V but denies diarrhea.  Pt. States never have experienced this before.

## 2012-07-27 NOTE — ED Provider Notes (Signed)
History     CSN: [id number]  Arrival date & time 07/27/12  2154   First MD Initiated Contact with Patient 07/27/12 2220      Chief Complaint  Patient presents with  . Chest Pain    (Consider location/radiation/quality/duration/timing/severity/associated sxs/prior treatment) HPI Comments: 61 yo F w a hx of DM, HTN, CAD (Nuclear stress test 5/ 2012, EF 64%, cath 07/2010: patent LIMA-LAD, occluded SVG to diagonal, medical therapy), & asthma presents to the ER c/o "chest cold." Symptom onset began 1 week ago and consist of productive cough (yellow sputum), wheezing, chest tightness, SOB, night sweats & chills. Pt has not taken temp so fever is unknown. Pt states she lives at home w family and has not had recent hospital admission. Pt denies N/V/D, abd pain, leg swelling, left arm pain, or jaw pain. Chest tightness is described as a tightness that worsens when she coughs, relieved by nothing & does not radiate.   The history is provided by the patient.    Past Medical History  Diagnosis Date  . Hx of CABG     2007  . Breast cancer     Double mastectomy 2007; s/p tamoxifen and arimidex therapy; no recurrence since 05/2008  . Hypertension   . Diabetes mellitus   . Ejection fraction     EF 65%, echo, [place], Jan 16, 2011  . Sleep apnea     CPAP being arranged May, 2011  . Dyslipidemia   . CAD (coronary artery disease)     Nuclear stress test Feb 11, 2011, EF 64%, no scar or ischemia    //         catheterization, November, 2011,patent LIMA-LAD-small caliber distal vessel with diffuse plaque, patent SVG to OM1 and OM 2, patent SVG to PDA and PLA, occluded SVG to diagonal, medical therapy  . Overweight     stomach stapling 1985 followed by weight loss, then return of weight  . Tobacco abuse     in the past, resolved  . Alcohol abuse     in the past, resolved  . Nausea & vomiting     hospitalization, November, 2011, stricture GE junction, questionable stenosis gastrojejunostomy,  disruptive primary peristaltic wave, GE reflux, delayed emptying proximal gastric pouch  . Hx of colonic polyps   . Asthma   . Myocardial infarction     Past Surgical History  Procedure Date  . Coronary artery bypass graft   . Mastectomy   . Coronary stent placement   . Abdominal hysterectomy     Family History  Problem Relation Age of Onset  . Hypertension Mother   . Heart disease Mother   . Diabetes Mother   . Heart disease Father   . Heart disease Sister   . Stomach cancer Sister   . Asthma Sister   . Colon cancer Father     History  Substance Use Topics  . Smoking status: Former Smoker -- 3.0 packs/day for 27 years    Types: Cigarettes    Quit date: 09/14/2000  . Smokeless tobacco: Never Used  . Alcohol Use: No    OB History    Grav Para Term Preterm Abortions TAB SAB Ect Mult Living                  Review of Systems  Constitutional: Positive for fever, chills and fatigue.  HENT: Negative for ear pain, congestion, sore throat, rhinorrhea, sneezing, neck pain, neck stiffness, sinus pressure and tinnitus.   Eyes:  Negative for visual disturbance.  Respiratory: Positive for cough and chest tightness.   Cardiovascular: Negative for chest pain and palpitations.  Gastrointestinal: Negative for nausea, vomiting, abdominal pain and diarrhea.  Genitourinary: Negative for dysuria.  Musculoskeletal: Positive for myalgias.  Skin: Negative for color change and rash.  Neurological: Negative for dizziness and weakness.  Hematological: Does not bruise/bleed easily.  Psychiatric/Behavioral: Negative for confusion.  All other systems reviewed and are negative.    Allergies  Review of patient's allergies indicates no known allergies.  Home Medications   Current Outpatient Rx  Name  Route  Sig  Dispense  Refill  . ALBUTEROL SULFATE HFA 108 (90 BASE) MCG/ACT IN AERS   Inhalation   Inhale 2 puffs into the lungs every 6 (six) hours as needed. For shortness of breath          . ASPIRIN EC 81 MG PO TBEC   Oral   Take 81 mg by mouth daily.         [name] Kitchen GLIPIZIDE 10 MG PO TABS   Oral   Take 10 mg by mouth daily.         [name] Kitchen HYDROCODONE-ACETAMINOPHEN 10-300 MG PO TABS   Oral   Take 1 tablet by mouth 2 (two) times daily as needed. For pain         . LISINOPRIL-HYDROCHLOROTHIAZIDE 20-12.5 MG PO TABS   Oral   Take 1 tablet by mouth daily.           [name] Kitchen NITROGLYCERIN 0.4 MG SL SUBL   Sublingual   Place 0.4 mg under the tongue every 5 (five) minutes as needed. For chest pain         . RABEPRAZOLE SODIUM 20 MG PO TBEC   Oral   Take 20 mg by mouth daily.         [name] Kitchen SIMVASTATIN 20 MG PO TABS   Oral   Take 20 mg by mouth daily.         . SUCRALFATE 1 GM/10ML PO SUSP   Oral   Take 1 g by mouth 2 (two) times daily.           BP 122/69  Pulse 116  Temp 102.9 F (39.4 C) (Oral)  Resp 18  SpO2 98%  Physical Exam  Nursing note and vitals reviewed. Constitutional: She is oriented to person, place, and time. She appears well-developed and well-nourished. She appears distressed.  HENT:  Head: Normocephalic and atraumatic.  Eyes: Conjunctivae normal and EOM are normal.  Neck: Normal range of motion.  Cardiovascular:       Tachycardic, no other aberrancy on auscultation   Pulmonary/Chest: Effort normal.       Mild respiratory distress, expiratory wheezing, bilateral rales. Pt able to speak full sentences.   Musculoskeletal: Normal range of motion.  Neurological: She is alert and oriented to person, place, and time.  Skin: Skin is warm and dry. No rash noted. She is not diaphoretic.  Psychiatric: She has a normal mood and affect. Her behavior is normal.    ED Course  Procedures (including critical care time)  Labs Reviewed  CBC WITH DIFFERENTIAL - Abnormal; Notable for the following:    WBC 11.1 (*)     RDW 16.0 (*)     Monocytes Absolute 1.4 (*)     All other components within normal limits  COMPREHENSIVE METABOLIC PANEL -  Abnormal; Notable for the following:    Sodium 128 (*)     Potassium 5.9 (*)  HEMOLYSIS AT THIS LEVEL MAY AFFECT RESULT   Chloride 94 (*)     CO2 18 (*)     Glucose, Bld 276 (*)     Albumin 3.3 (*)     AST 51 (*)     GFR calc non Af Amer 72 (*)     GFR calc Af Amer 83 (*)     All other components within normal limits  POCT I-STAT TROPONIN I  URINALYSIS, ROUTINE W REFLEX MICROSCOPIC  URINE CULTURE  INFLUENZA PANEL BY PCR   Dg Chest 2 View  07/27/2012  *RADIOLOGY REPORT*  Clinical Data: Chest pain  CHEST - 2 VIEW  Comparison: 06/08/2012  Findings: Low lung volumes with bibasilar atelectasis.  Prior CABG. Heart is normal size.  No effusions or acute bony abnormality.  IMPRESSION: Low lung volumes.  Bibasilar atelectasis.   Original Report Authenticated By: Charlett Nose, M.D.      Date: 07/28/2012  Rate: 115  Rhythm: sinus tachycardia  QRS Axis: normal  Intervals: normal  ST/T Wave abnormalities: nonspecific ST/T changes  Conduction Disutrbances:none  Narrative Interpretation:   Old EKG Reviewed: changes noted  No diagnosis found.  12:41 AM pt not in room   MDM  Febrile, tachycardic, leukocytosis, productive cough & CP.  Onset x 1 week. IV rocephin + azithromycin and IVF given. Admitting pt for SIRS for unknown source at this time. Influenza PCR and urine still pending.  At this time there does not appear to be any evidence of an acute emergency medical condition and the patient appears stable for discharge with appropriate outpatient follow up.Diagnosis was discussed with patient who verbalizes understanding and is agreeable to discharge. Pt case discussed with Dr. Silverio Lay who agrees with my plan.              Jaci Carrel, [place] 07/28/12 [phone]

## 2012-07-28 ENCOUNTER — Encounter (HOSPITAL_COMMUNITY): Payer: Self-pay | Admitting: Internal Medicine

## 2012-07-28 DIAGNOSIS — R000 Tachycardia, unspecified: Secondary | ICD-10-CM | POA: Diagnosis present

## 2012-07-28 DIAGNOSIS — E119 Type 2 diabetes mellitus without complications: Secondary | ICD-10-CM

## 2012-07-28 DIAGNOSIS — I2510 Atherosclerotic heart disease of native coronary artery without angina pectoris: Secondary | ICD-10-CM

## 2012-07-28 DIAGNOSIS — I10 Essential (primary) hypertension: Secondary | ICD-10-CM

## 2012-07-28 DIAGNOSIS — R6510 Systemic inflammatory response syndrome (SIRS) of non-infectious origin without acute organ dysfunction: Secondary | ICD-10-CM | POA: Diagnosis present

## 2012-07-28 LAB — COMPREHENSIVE METABOLIC PANEL
AST: 51 U/L — ABNORMAL HIGH (ref 0–37)
BUN: 11 mg/dL (ref 6–23)
CO2: 18 mEq/L — ABNORMAL LOW (ref 19–32)
Chloride: 94 mEq/L — ABNORMAL LOW (ref 96–112)
Creatinine, Ser: 0.85 mg/dL (ref 0.50–1.10)
GFR calc Af Amer: 83 mL/min — ABNORMAL LOW (ref >90)
GFR calc non Af Amer: 72 mL/min — ABNORMAL LOW (ref >90)
Glucose, Bld: 276 mg/dL — ABNORMAL HIGH (ref 70–99)
Total Bilirubin: 0.5 mg/dL (ref 0.3–1.2)

## 2012-07-28 LAB — POCT I-STAT, CHEM 8
BUN: 10 mg/dL (ref 6–23)
Calcium, Ion: 1.09 mmol/L — ABNORMAL LOW (ref 1.13–1.30)
Chloride: 101 mEq/L (ref 96–112)
Glucose, Bld: 293 mg/dL — ABNORMAL HIGH (ref 70–99)
HCT: 42 % (ref 36.0–46.0)
TCO2: 26 mmol/L (ref 0–100)

## 2012-07-28 LAB — CBC
HCT: 40 % (ref 36.0–46.0)
Hemoglobin: 13.3 g/dL (ref 12.0–15.0)
MCH: 28.3 pg (ref 26.0–34.0)
MCV: 85.1 fL (ref 78.0–100.0)
Platelets: 146 10*3/uL — ABNORMAL LOW (ref 150–400)
RBC: 4.7 MIL/uL (ref 3.87–5.11)
WBC: 12.4 10*3/uL — ABNORMAL HIGH (ref 4.0–10.5)

## 2012-07-28 LAB — URINALYSIS, ROUTINE W REFLEX MICROSCOPIC
Glucose, UA: >1000 mg/dL — AB
Ketones, ur: 40 mg/dL — AB
Nitrite: NEGATIVE
pH: 5.5 (ref 5.0–8.0)

## 2012-07-28 LAB — BASIC METABOLIC PANEL
CO2: 19 mEq/L (ref 19–32)
Chloride: 98 mEq/L (ref 96–112)
Glucose, Bld: 280 mg/dL — ABNORMAL HIGH (ref 70–99)
Potassium: 4.5 mEq/L (ref 3.5–5.1)
Sodium: 134 mEq/L — ABNORMAL LOW (ref 135–145)

## 2012-07-28 LAB — GLUCOSE, CAPILLARY: Glucose-Capillary: 209 mg/dL — ABNORMAL HIGH (ref 70–99)

## 2012-07-28 LAB — URINE MICROSCOPIC-ADD ON

## 2012-07-28 LAB — INFLUENZA PANEL BY PCR (TYPE A & B): Influenza A By PCR: NEGATIVE

## 2012-07-28 MED ORDER — INSULIN ASPART 100 UNIT/ML ~~LOC~~ SOLN: 0.0000 [IU] | Freq: Three times a day (TID) | SUBCUTANEOUS | Status: DC

## 2012-07-28 MED ORDER — ACETAMINOPHEN 650 MG RE SUPP: 650.0000 mg | Freq: Four times a day (QID) | RECTAL | Status: DC | PRN

## 2012-07-28 MED ORDER — LEVALBUTEROL HCL 0.63 MG/3ML IN NEBU
0.6300 mg | INHALATION_SOLUTION | Freq: Four times a day (QID) | RESPIRATORY_TRACT | Status: DC
  Filled 2012-07-28 (×5): qty 3

## 2012-07-28 MED ORDER — SODIUM CHLORIDE 0.9 % IV BOLUS (SEPSIS): 1000.0000 mL | Freq: Once | INTRAVENOUS | Status: DC

## 2012-07-28 MED ORDER — ACETAMINOPHEN 325 MG PO TABS
650.0000 mg | ORAL_TABLET | Freq: Four times a day (QID) | ORAL | Status: DC | PRN
  Administered 2012-07-28: 650 mg via ORAL
  Filled 2012-07-28: qty 2

## 2012-07-28 MED ORDER — GUAIFENESIN-DM 100-10 MG/5ML PO SYRP: 5.0000 mL | ORAL_SOLUTION | ORAL | Status: DC | PRN

## 2012-07-28 MED ORDER — ALBUTEROL SULFATE HFA 108 (90 BASE) MCG/ACT IN AERS: 2.0000 | INHALATION_SPRAY | RESPIRATORY_TRACT | Status: DC | PRN

## 2012-07-28 MED ORDER — LEVALBUTEROL HCL 0.63 MG/3ML IN NEBU
0.6300 mg | INHALATION_SOLUTION | Freq: Four times a day (QID) | RESPIRATORY_TRACT | Status: DC | PRN
  Filled 2012-07-28: qty 3

## 2012-07-28 MED ORDER — PANTOPRAZOLE SODIUM 40 MG PO TBEC
40.0000 mg | DELAYED_RELEASE_TABLET | Freq: Every day | ORAL | Status: DC
  Administered 2012-07-28: 40 mg via ORAL
  Filled 2012-07-28: qty 1

## 2012-07-28 MED ORDER — SODIUM CHLORIDE 0.9 % IV BOLUS (SEPSIS)
1000.0000 mL | Freq: Once | INTRAVENOUS | Status: AC
  Administered 2012-07-28: 1000 mL via INTRAVENOUS

## 2012-07-28 MED ORDER — SODIUM CHLORIDE 0.9 % IV SOLN
INTRAVENOUS | Status: DC
  Administered 2012-07-28: 06:00:00 via INTRAVENOUS

## 2012-07-28 MED ORDER — INSULIN ASPART 100 UNIT/ML ~~LOC~~ SOLN
0.0000 [IU] | Freq: Three times a day (TID) | SUBCUTANEOUS | Status: DC
  Administered 2012-07-28: 3 [IU] via SUBCUTANEOUS
  Administered 2012-07-28: 5 [IU] via SUBCUTANEOUS

## 2012-07-28 MED ORDER — SUCRALFATE 1 GM/10ML PO SUSP
1.0000 g | Freq: Two times a day (BID) | ORAL | Status: DC
  Filled 2012-07-28 (×2): qty 10

## 2012-07-28 MED ORDER — ASPIRIN EC 81 MG PO TBEC
81.0000 mg | DELAYED_RELEASE_TABLET | Freq: Every day | ORAL | Status: DC
  Administered 2012-07-28: 81 mg via ORAL
  Filled 2012-07-28: qty 1

## 2012-07-28 MED ORDER — HYDROCODONE-ACETAMINOPHEN 10-325 MG PO TABS: 1.0000 | ORAL_TABLET | Freq: Two times a day (BID) | ORAL | Status: DC | PRN

## 2012-07-28 MED ORDER — ENOXAPARIN SODIUM 40 MG/0.4ML ~~LOC~~ SOLN
40.0000 mg | SUBCUTANEOUS | Status: DC
  Administered 2012-07-28: 40 mg via SUBCUTANEOUS
  Filled 2012-07-28: qty 0.4

## 2012-07-28 MED ORDER — HYDROCODONE-ACETAMINOPHEN 10-300 MG PO TABS: 1.0000 | ORAL_TABLET | Freq: Two times a day (BID) | ORAL | Status: DC | PRN

## 2012-07-28 MED ORDER — SIMVASTATIN 20 MG PO TABS
20.0000 mg | ORAL_TABLET | Freq: Every day | ORAL | Status: DC
  Filled 2012-07-28: qty 1

## 2012-07-28 MED ORDER — NITROGLYCERIN 0.4 MG SL SUBL: 0.4000 mg | SUBLINGUAL_TABLET | SUBLINGUAL | Status: DC | PRN

## 2012-07-28 MED ORDER — SODIUM CHLORIDE 0.9 % IJ SOLN: 3.0000 mL | Freq: Two times a day (BID) | INTRAMUSCULAR | Status: DC

## 2012-07-28 NOTE — H&P (Signed)
Internal Medicine Teaching Service Attending Note Date: 07/28/2012  Patient name: Patricia Cuevas  Medical record number: [id number]  Date of birth: 12-20-1950   I have seen and evaluated Patricia Cuevas and discussed their care with the Residency Team.    [name] is a 61yo woman with extensive cardiac history, HTN, DM and asthma who presented to the ED with complaints of worsening productive cough, sinus pressure and drainage and fatigue X 1 week.  She notes further associated chills and dizziness upon standing over this week.  Her cough gradually became productive of a yellow sputum.  She further reports chest and abdominal pain and urinary incontinence with coughing.  She recently visited her PCP's office where she was given an Rx for Avelox and tamiflu.  She had a fever when seen.  She took one pill of the avelox, however, she was not improved and reported to the ED.  While there, she was noted to be tachycardic, febrile and tachypneic and was given rocephin and azithromycin and fluids.  She has one sick contact, her sister, who recently was treated for pneumonia.  Further symptoms include headache and urinary urgency.  She also had some right ear pain that resolved with avelox dose.   She denies n/v/d, SOB, constipation.   Further PMH, PSH, PFSH, meds/allergies per resident note.   Physical Exam: Blood pressure 102/68, pulse 105, temperature 98.9 F (37.2 C), temperature source Oral, resp. rate 18, height 4\' 11"  (1.499 m), weight 211 lb 3.2 oz (95.8 kg), SpO2 96.00%. General appearance: alert, cooperative and no distress Head: Normocephalic, without obvious abnormality, atraumatic Eyes: EOMI, anicteric sclerae Lungs: diffuse wheeezing, somewhat improved from previous, otherwise good airflow Heart: normal rate when seen, no murmujr noted Abdomen: soft, nt, nd, +BS Extremities: extremities normal, atraumatic, no cyanosis or edema Pulses: 2+ and symmetric Skin: no rash or lesion  noted Neurologic: Grossly normal  Lab results: Results for orders placed during the hospital encounter of 07/27/12 (from the past 24 hour(s))  CBC WITH DIFFERENTIAL     Status: Abnormal   Collection Time   07/27/12 10:08 PM      Component Value Range   WBC 11.1 (*) 4.0 - 10.5 K/uL   RBC 4.94  3.87 - 5.11 MIL/uL   Hemoglobin 14.0  12.0 - 15.0 g/dL   HCT 40.9  81.1 - 91.4 %   MCV 84.8  78.0 - 100.0 fL   MCH 28.3  26.0 - 34.0 pg   MCHC 33.4  30.0 - 36.0 g/dL   RDW 78.2 (*) 95.6 - 21.3 %   Platelets 185  150 - 400 K/uL   Neutrophils Relative 66  43 - 77 %   Neutro Abs 7.3  1.7 - 7.7 K/uL   Lymphocytes Relative 21  12 - 46 %   Lymphs Abs 2.3  0.7 - 4.0 K/uL   Monocytes Relative 12  3 - 12 %   Monocytes Absolute 1.4 (*) 0.1 - 1.0 K/uL   Eosinophils Relative 0  0 - 5 %   Eosinophils Absolute 0.0  0.0 - 0.7 K/uL   Basophils Relative 0  0 - 1 %   Basophils Absolute 0.0  0.0 - 0.1 K/uL  COMPREHENSIVE METABOLIC PANEL     Status: Abnormal   Collection Time   07/27/12 10:08 PM      Component Value Range   Sodium 128 (*) 135 - 145 mEq/L   Potassium 5.9 (*) 3.5 - 5.1 mEq/L   Chloride 94 (*)  96 - 112 mEq/L   CO2 18 (*) 19 - 32 mEq/L   Glucose, Bld 276 (*) 70 - 99 mg/dL   BUN 11  6 - 23 mg/dL   Creatinine, Ser 8.46  0.50 - 1.10 mg/dL   Calcium 9.0  8.4 - 96.2 mg/dL   Total Protein 7.8  6.0 - 8.3 g/dL   Albumin 3.3 (*) 3.5 - 5.2 g/dL   AST 51 (*) 0 - 37 U/L   ALT 21  0 - 35 U/L   Alkaline Phosphatase 81  39 - 117 U/L   Total Bilirubin 0.5  0.3 - 1.2 mg/dL   GFR calc non Af Amer 72 (*) >90 mL/min   GFR calc Af Amer 83 (*) >90 mL/min  POCT I-STAT TROPONIN I     Status: Normal   Collection Time   07/27/12 11:48 PM      Component Value Range   Troponin i, poc 0.00  0.00 - 0.08 ng/mL   Comment 3           URINALYSIS, ROUTINE W REFLEX MICROSCOPIC     Status: Abnormal   Collection Time   07/28/12 12:31 AM      Component Value Range   Color, Urine AMBER (*) YELLOW   APPearance CLOUDY  (*) CLEAR   Specific Gravity, Urine 1.043 (*) 1.005 - 1.030   pH 5.5  5.0 - 8.0   Glucose, UA >1000 (*) NEGATIVE mg/dL   Hgb urine dipstick SMALL (*) NEGATIVE   Bilirubin Urine MODERATE (*) NEGATIVE   Ketones, ur 40 (*) NEGATIVE mg/dL   Protein, ur 952 (*) NEGATIVE mg/dL   Urobilinogen, UA 1.0  0.0 - 1.0 mg/dL   Nitrite NEGATIVE  NEGATIVE   Leukocytes, UA TRACE (*) NEGATIVE  URINE MICROSCOPIC-ADD ON     Status: Abnormal   Collection Time   07/28/12 12:31 AM      Component Value Range   Squamous Epithelial / LPF RARE  RARE   WBC, UA 0-2  <3 WBC/hpf   RBC / HPF 0-2  <3 RBC/hpf   Bacteria, UA FEW (*) RARE   Urine-Other MUCOUS PRESENT    POCT I-STAT, CHEM 8     Status: Abnormal   Collection Time   07/28/12 12:44 AM      Component Value Range   Sodium 135  135 - 145 mEq/L   Potassium 3.8  3.5 - 5.1 mEq/L   Chloride 101  96 - 112 mEq/L   BUN 10  6 - 23 mg/dL   Creatinine, Ser 8.41  0.50 - 1.10 mg/dL   Glucose, Bld 324 (*) 70 - 99 mg/dL   Calcium, Ion 4.01 (*) 1.13 - 1.30 mmol/L   TCO2 26  0 - 100 mmol/L   Hemoglobin 14.3  12.0 - 15.0 g/dL   HCT 02.7  25.3 - 66.4 %  INFLUENZA PANEL BY PCR     Status: Normal   Collection Time   07/28/12  1:52 AM      Component Value Range   Influenza A By PCR NEGATIVE  NEGATIVE   Influenza B By PCR NEGATIVE  NEGATIVE   H1N1 flu by pcr NOT DETECTED  NOT DETECTED  BASIC METABOLIC PANEL     Status: Abnormal   Collection Time   07/28/12  5:30 AM      Component Value Range   Sodium 134 (*) 135 - 145 mEq/L   Potassium 4.5  3.5 - 5.1 mEq/L   Chloride 98  96 - 112 mEq/L   CO2 19  19 - 32 mEq/L   Glucose, Bld 280 (*) 70 - 99 mg/dL   BUN 10  6 - 23 mg/dL   Creatinine, Ser 4.78  0.50 - 1.10 mg/dL   Calcium 8.6  8.4 - 29.5 mg/dL   GFR calc non Af Amer 76 (*) >90 mL/min   GFR calc Af Amer 88 (*) >90 mL/min  CBC     Status: Abnormal   Collection Time   07/28/12  5:30 AM      Component Value Range   WBC 12.4 (*) 4.0 - 10.5 K/uL   RBC 4.70  3.87 -  5.11 MIL/uL   Hemoglobin 13.3  12.0 - 15.0 g/dL   HCT 62.1  30.8 - 65.7 %   MCV 85.1  78.0 - 100.0 fL   MCH 28.3  26.0 - 34.0 pg   MCHC 33.3  30.0 - 36.0 g/dL   RDW 84.6 (*) 96.2 - 95.2 %   Platelets 146 (*) 150 - 400 K/uL  HEMOGLOBIN A1C     Status: Abnormal   Collection Time   07/28/12  5:30 AM      Component Value Range   Hemoglobin A1C 8.8 (*) <5.7 %   Mean Plasma Glucose 206 (*) <117 mg/dL  HIV ANTIBODY (ROUTINE TESTING)     Status: Normal   Collection Time   07/28/12  5:30 AM      Component Value Range   HIV NON REACTIVE  NON REACTIVE  GLUCOSE, CAPILLARY     Status: Abnormal   Collection Time   07/28/12  7:16 AM      Component Value Range   Glucose-Capillary 253 (*) 70 - 99 mg/dL  GLUCOSE, CAPILLARY     Status: Abnormal   Collection Time   07/28/12 11:41 AM      Component Value Range   Glucose-Capillary 209 (*) 70 - 99 mg/dL    Imaging results:  Dg Chest 2 View  07/27/2012  *RADIOLOGY REPORT*  Clinical Data: Chest pain  CHEST - 2 VIEW  Comparison: 06/08/2012  Findings: Low lung volumes with bibasilar atelectasis.  Prior CABG. Heart is normal size.  No effusions or acute bony abnormality.  IMPRESSION: Low lung volumes.  Bibasilar atelectasis.   Original Report Authenticated By: Charlett Nose, M.D.     Assessment and Plan: I agree with the formulated Assessment and Plan with the following changes:   1. Upper respiratory infection - [name] was much improved with antibiotics and fluids as given in the ED, she noted feeling much better and not requiring oxygen - Flu pcr negative, d/c droplet precaution - Blood culture were drawn, but Abx already started and on avelox - Robitussin for cough - CXR as above, not revealing - Tylenol for repeat fever - Nebulizers scheduled and prn - F/U AML  2. HTN - As patient with mildly low BP, BP meds held at this time  3. CAD - Discussion re patients history can be found in resident note - Statin, aspirin - PRN nitro - CE  negative X 1  Other issues as per resident note.   Inez Catalina, MD 11/14/20134:33 PM

## 2012-07-28 NOTE — ED Notes (Signed)
Patient ambulated to bathroom with pulse ox, oxygen dropped from 96% to 92%. Patient ambulated back to room oxygen was 90%.

## 2012-07-28 NOTE — Progress Notes (Signed)
Pt provided with dc instructions and education. Pt has no questions at this time. Pt educated on new medications and which medications she should take tonight. VSS. IV removed with tip intact. Heart monitor cleaned and returned to front. Pt leaving with family for home. Levonne Spiller, RN

## 2012-07-28 NOTE — H&P (Signed)
Hospital Admission Note Date: 07/28/2012  Patient name: Patricia Cuevas Medical record number: [id number] Date of birth: 05-01-1951 Age: 61 y.o. Gender: female PCP: VERTEFEUILLE,CYNTHIA K., NP  Medical Service: Internal Medicine Teaching Service--[facility]  Attending physician: Dr. Debe Coder  1st Contact: Dr. Carmelina Noun  2nd Contact: Dr. Clyde Lundborg     Pager:[phone]   After 5 pm or weekends:  1st Contact:     Pager: [phone]  2nd Contact:     Pager: [phone]  Chief Complaint: Cough and fatigue  History of Present Illness: [name] is a 61 year old female with PMH CAD (s/p CABG 2007 and MI, Nuclear stress test 5/ 2012 EF 64%, Cath 07/2010: patent LIMA-LAD, occluded), HTN, DM, breast cancer s/p double mastectomy 2007, and asthma presents to the ED today with complaints worsening productive cough, sinus pressure, and fatigue x1 week.  [name] claims starting last Friday 07/22/12 she developed a cough, chills, dizziness upon standing fast, and started feeling week.  Her cough then turned productive with yellow sputum over the course of the next few days.  She also endorses chest and abdominal pain along with urinary incontinence with coughing.  She claims, she went to her PCP's office Dr. Katrinka Blazing and saw his NP Humberto Seals) yesterday for her symptoms.  [name] claims that she apparently was noted to have a fever of 110 at that time but did not get admitted to the hospital.  Instead she was prescribed a 7 day course of Avelox 400mg  daily and Tamiflu 75mg .  She has taken 1 pill of the Avelox thus far.  She denies any N/V/D, shortness of breath, chest pain, constipation at this time.  She does complain of headaches, sinus pressure, and urinary urgency and frequency with little output, and decreased appetite x1 week.   She also claims that she had right ear pain but has resolved since she took the Avelox.    In the ED she was noted to have tachycardia (110s), tachypnea, and Tmax 102.9 and  was started on IV Rocephin 1g and Azithromycin 500mg  x1, albuterol nebulizer, and NS 1L bolus x1.   Of note, she reports 1 sick contact--her sister who was admitted to the hospital for PNA and treated with abx.  [name] visited her sister in the hospital last week who had similar symptoms of cough and fever.    Meds: Current Outpatient Rx  Name  Route  Sig  Dispense  Refill  . ALBUTEROL SULFATE HFA 108 (90 BASE) MCG/ACT IN AERS   Inhalation   Inhale 2 puffs into the lungs every 6 (six) hours as needed. For shortness of breath         . ASPIRIN EC 81 MG PO TBEC   Oral   Take 81 mg by mouth daily.         [name] Kitchen GLIPIZIDE 10 MG PO TABS   Oral   Take 10 mg by mouth daily.         [name] Kitchen HYDROCODONE-ACETAMINOPHEN 10-300 MG PO TABS   Oral   Take 1 tablet by mouth 2 (two) times daily as needed. For pain         . LISINOPRIL-HYDROCHLOROTHIAZIDE 20-12.5 MG PO TABS   Oral   Take 1 tablet by mouth daily.           [name] Kitchen NITROGLYCERIN 0.4 MG SL SUBL   Sublingual   Place 0.4 mg under the tongue every 5 (five) minutes as needed. For chest pain         .  RABEPRAZOLE SODIUM 20 MG PO TBEC   Oral   Take 20 mg by mouth daily.         [name] Kitchen SIMVASTATIN 20 MG PO TABS   Oral   Take 20 mg by mouth daily.         . SUCRALFATE 1 GM/10ML PO SUSP   Oral   Take 1 g by mouth 2 (two) times daily.          Allergies: Allergies as of 07/27/2012  . (No Known Allergies)   Past Medical History  Diagnosis Date  . Hx of CABG     2007  . Breast cancer     Double mastectomy 2007; s/p tamoxifen and arimidex therapy; no recurrence since 05/2008  . Hypertension   . Diabetes mellitus   . Ejection fraction     EF 65%, echo, [place], Jan 16, 2011  . Sleep apnea     CPAP being arranged May, 2011  . Dyslipidemia   . CAD (coronary artery disease)     Nuclear stress test Feb 11, 2011, EF 64%, no scar or ischemia    //         catheterization, November, 2011,patent LIMA-LAD-small caliber distal vessel  with diffuse plaque, patent SVG to OM1 and OM 2, patent SVG to PDA and PLA, occluded SVG to diagonal, medical therapy  . Overweight     stomach stapling 1985 followed by weight loss, then return of weight  . Tobacco abuse     in the past, resolved  . Alcohol abuse     in the past, resolved  . Nausea & vomiting     hospitalization, November, 2011, stricture GE junction, questionable stenosis gastrojejunostomy, disruptive primary peristaltic wave, GE reflux, delayed emptying proximal gastric pouch  . Hx of colonic polyps   . Asthma   . Myocardial infarction    Past Surgical History  Procedure Date  . Coronary artery bypass graft   . Mastectomy   . Coronary stent placement   . Abdominal hysterectomy    Family History  Problem Relation Age of Onset  . Hypertension Mother   . Heart disease Mother   . Diabetes Mother   . Heart disease Father   . Heart disease Sister   . Stomach cancer Sister   . Asthma Sister   . Colon cancer Father    History   Social History  . Marital Status: Single    Spouse Name: N/A    Number of Children: N/A  . Years of Education: N/A   Occupational History  . Disabled    Social History Main Topics  . Smoking status: Former Smoker -- 3.0 packs/day for 27 years    Types: Cigarettes    Quit date: 09/14/2000  . Smokeless tobacco: Never Used  . Alcohol Use: No  . Drug Use: No  . Sexually Active: Not on file   Other Topics Concern  . Not on file   Social History Narrative  . No narrative on file   Review of Systems: Pertinent items are noted in HPI.  Physical Exam: Blood pressure 122/69, pulse 116, temperature 102.9 F (39.4 C), temperature source Oral, resp. rate 18, SpO2 98.00%. Vitals reviewed. General: resting in bed, NAD, morbidly obese HEENT: PERRLA, EOMI, no scleral icterus Cardiac: Tachycardia, distant heart sounds Pulm: diffuse expiratory wheezing,  Abd: soft, obese, nontender, nondistended, +BS present Ext: warm and well  perfused, no pedal edema, +2dp b/l, + tenderness to palpation of b/l knees, left  arm lymphadema  Neuro: alert and oriented X3, cranial nerves II-XII grossly intact, strength and sensation to light touch equal in bilateral upper and lower extremities  Lab results: Basic Metabolic Panel:  Basename 07/28/12 0044 07/27/12 2208  NA 135 128*  K 3.8 5.9*  CL 101 94*  CO2 -- 18*  GLUCOSE 293* 276*  BUN 10 11  CREATININE 1.10 0.85  CALCIUM -- 9.0  MG -- --  PHOS -- --   Liver Function Tests:  [facility] 07/27/12 2208  AST 51*  ALT 21  ALKPHOS 81  BILITOT 0.5  PROT 7.8  ALBUMIN 3.3*   CBC:  Basename 07/28/12 0044 07/27/12 2208  WBC -- 11.1*  NEUTROABS -- 7.3  HGB 14.3 14.0  HCT 42.0 41.9  MCV -- 84.8  PLT -- 185    Basename 07/28/12 0031  COLORURINE AMBER*  LABSPEC 1.043*  PHURINE 5.5  GLUCOSEU >1000*  HGBUR SMALL*  BILIRUBINUR MODERATE*  KETONESUR 40*  PROTEINUR 100*  UROBILINOGEN 1.0  NITRITE NEGATIVE  LEUKOCYTESUR TRACE*   Imaging results:  Dg Chest 2 View  07/27/2012  *RADIOLOGY REPORT*  Clinical Data: Chest pain  CHEST - 2 VIEW  Comparison: 06/08/2012  Findings: Low lung volumes with bibasilar atelectasis.  Prior CABG. Heart is normal size.  No effusions or acute bony abnormality.  IMPRESSION: Low lung volumes.  Bibasilar atelectasis.   Original Report Authenticated By: Charlett Nose, M.D.    Other results: EKG: 115bpm, sinus tachycardia, left atrial enlargement, nonspecific ST & T wave changes  Assessment & Plan by Problem: [name] is a 61 year old female with PMH CAD, HTN, DM, and Asthma admitted for SIRS.     SIRS (systemic inflammatory response syndrome)--unknown etiology.  Hx of asthma, denies recent exacerbation. 1 known sick contact--sister with PNA.  Tmax 102.9 on admission, WBC: 11.1 with ANC 7.3, HR: 116, RR 18.  Noted by RN for o2 sat dropping to 92% upon ambulating.  CXR: low lung volumes with bibasilar atelectasis.  U/A: >1000 glucose, moderate  bilirubin, 40 ketones, 100 protein, trace leukocytes, small Hb, mucous present, 0-2 wbc and rbc, few bacteria, nitrite negative.  Possible viral URI vs. Influenza vs. PNA--CAP (although negative CXR, but febrile and slight leukocytosis).  Given IV Rocephin and Azithromycin x1 in ED.    -admit to tele -droplet isolation continuous pulse oximetry -[place] O2, keep o2 >92% -blood cx 2--abx already started prior to draw -f/u influenza pcr -robitussin prn cough -consider repeat cxr if symptoms worsen -IVF--NS@125cc /hr -Tylenol PRN fever -HIV -xopenex nebulizer scheduled and PRN -AM lab: CBC, BMP   Hypertension--BP on admission 122/69, responding to fluids.  Noted to be 98/62 while in ED.  On home regimen Prinzide 20-12.5mg . -IVF -continue to monitor -holding home BP meds -orthostatic vital signs   Diabetes mellitus--glucose 293 on admission, morbidly obese, hx of gastric bypass.  On home regimen: glipizide 10mg . -CBG monitoring -SSI sensitive -HbA1c   CAD (coronary artery disease)--hx of MI, morbid obesity, HTN, DM, HL, s/p CABG 2 vessel 2000,occluded graft with stents with MI~2011,  followed by Dr. Myrtis Ser.  Complaints of chest pain last week with cough.  Cath 07/2010 (Dr. Riley Kill): normal LV function, patent LIMA to distal LAD with small caliber distal vessel that may demonstrate some diffuse plaque, patent SVG to OM 1 and OM2, occluded SVG to the diagonal, and preserved LV function.  Recommend continuing medical therapy.  Echo 01/2011: normal systolic function, EF 65%, no obvious regional wall motion abnormality, mild LVH with mild  diastolic dysfunction, no obvious RVH, mildly enlarged left atrium, trace MR, TI, and mildly elevated pulmonic pressure.  Nuclear stress test 01/2012 (Dr. Patty Sermons): normal stress nuclear study, LVEF 66%, normal LV function and normal wall motion. EKG on admission: 115bpm, sinus tachycardia, left atrial enlargement.  On home regimen of: ASA, Prinzide, and Simvastatin. Trop  poc 0.00 on admission.   -simvastatin -nitrostat PRN -ASA -hold Prinzide in the setting of hypotension   GERD (gastroesophageal reflux disease)--on home regimen Aciphex 20mg .  -protonix  Diet: heart healthy DVT Ppx: Lovenox  Dispo: Disposition is deferred at this time, awaiting improvement of current medical problems. Admitted for observation with anticipated discharge in approximately less than 2 day(s).   The patient does have current PCP (VERTEFEUILLE,CYNTHIA K., NP), therefore will not require OPC follow-up after discharge.   The patient does not have transportation limitations that hinder transportation to clinic appointments.  SignedDarden Palmer 07/28/2012, 1:01 AM

## 2012-07-28 NOTE — Care Management Note (Unsigned)
    Page 1 of 1   07/28/2012     10:39:59 AM   CARE MANAGEMENT NOTE 07/28/2012  Patient:  Patricia Cuevas, Patricia Cuevas   Account Number:  [id number]  Date Initiated:  07/28/2012  Documentation initiated by:  GRAVES-BIGELOW,Geneva Barrero  Subjective/Objective Assessment:   Pt admitted with fever and placed on IV ABX therapy. Pt is form home with family and has an aide from [facility] that visits daily.     Action/Plan:   CM will continue to monitor for additional  disposition needs.   Anticipated DC Date:  07/30/2012   Anticipated DC Plan:  HOME W HOME HEALTH SERVICES      DC Planning Services  CM consult      Choice offered to / List presented to:             Status of service:  In process, will continue to follow Medicare Important Message given?   (If response is "NO", the following Medicare IM given date fields will be blank) Date Medicare IM given:   Date Additional Medicare IM given:    Discharge Disposition:    Per UR Regulation:  Reviewed for med. necessity/level of care/duration of stay  If discussed at Long Length of Stay Meetings, dates discussed:    Comments:

## 2012-07-28 NOTE — Discharge Summary (Signed)
Internal Medicine Teaching [facility] Discharge Note  Name: Patricia Cuevas MRN: [id number] DOB: Jan 17, 1951 61 y.o.  Date of Admission: 07/27/2012 10:08 PM Date of Discharge: 07/28/2012 Attending Physician: Inez Catalina, MD  Discharge Diagnosis: Principal Problem:  *SIRS (systemic inflammatory response syndrome) Active Problems:  Hypertension  Diabetes mellitus  CAD (coronary artery disease)  GERD (gastroesophageal reflux disease)  Tachycardia   Discharge Medications:   Medication List     As of 07/28/2012  4:22 PM    STOP taking these medications         Hydrocodone-Acetaminophen 10-300 MG Tabs      TAKE these medications         albuterol 108 (90 BASE) MCG/ACT inhaler   Commonly known as: PROVENTIL HFA;VENTOLIN HFA   Inhale 2 puffs into the lungs every 6 (six) hours as needed. For shortness of breath      aspirin EC 81 MG tablet   Take 81 mg by mouth daily.      glipiZIDE 10 MG tablet   Commonly known as: GLUCOTROL   Take 10 mg by mouth daily.      guaiFENesin-dextromethorphan 100-10 MG/5ML syrup   Commonly known as: ROBITUSSIN DM   Take 5 mLs by mouth every 4 (four) hours as needed for cough.      lisinopril-hydrochlorothiazide 20-12.5 MG per tablet   Commonly known as: PRINZIDE,ZESTORETIC   Take 1 tablet by mouth daily.      nitroGLYCERIN 0.4 MG SL tablet   Commonly known as: NITROSTAT   Place 0.4 mg under the tongue every 5 (five) minutes as needed. For chest pain      RABEprazole 20 MG tablet   Commonly known as: ACIPHEX   Take 20 mg by mouth daily.      simvastatin 20 MG tablet   Commonly known as: ZOCOR   Take 20 mg by mouth daily.      sucralfate 1 GM/10ML suspension   Commonly known as: CARAFATE   Take 1 g by mouth 2 (two) times daily.          Disposition and follow-up:   Patricia Cuevas was discharged from [facility] in stable condition.  At the hospital follow up visit please address resolution of her  cough. She also needs better control and follow up for her diabetes; she may require insulin. Please also reassess her blood pressure and need for medication--she had low blood pressure while in the hospital.   Follow-up Appointments: Follow-up Information    Follow up with [name], MD. (Monday, November 18th at 2:15PM)    Contact information:   [address] [place] [address] [phone]        Discharge Orders    Future Orders Please Complete By Expires   Diet - low sodium heart healthy      Increase activity slowly         Consultations:  None  Procedures Performed:  Dg Chest 2 View  07/27/2012  *RADIOLOGY REPORT*  Clinical Data: Chest pain  CHEST - 2 VIEW  Comparison: 06/08/2012  Findings: Low lung volumes with bibasilar atelectasis.  Prior CABG. Heart is normal size.  No effusions or acute bony abnormality.  IMPRESSION: Low lung volumes.  Bibasilar atelectasis.   Original Report Authenticated By: Charlett Nose, M.D.    Admission HPI:  Patricia Cuevas is a 61 year old female with PMH CAD (s/p CABG 2007 and MI, Nuclear stress test 5/ 2012 EF 64%,  Cath 07/2010: patent LIMA-LAD, occluded), HTN, DM, breast cancer s/p double mastectomy 2007, and asthma presents to the ED today with complaints worsening productive cough, sinus pressure, and fatigue x1 week. Patricia Cuevas claims starting last Friday 07/22/12 she developed a cough, chills, dizziness upon standing fast, and started feeling week. Her cough then turned productive with yellow sputum over the course of the next few days. She also endorses chest and abdominal pain along with urinary incontinence with coughing. She claims, she went to her PCP's office Dr. Katrinka Blazing and saw his NP Humberto Seals) yesterday for her symptoms. Patricia Cuevas claims that she apparently was noted to have a fever of 110 at that time but did not get admitted to the hospital. Instead she was prescribed a 7 day course of Avelox 400mg  daily and  Tamiflu 75mg . She has taken 1 pill of the Avelox thus far. She denies any N/V/D, shortness of breath, chest pain, constipation at this time. She does complain of headaches, sinus pressure, and urinary urgency and frequency with little output, and decreased appetite x1 week. She also claims that she had right ear pain but has resolved since she took the Avelox.  In the ED she was noted to have tachycardia (110s), tachypnea, and Tmax 102.9 and was started on IV Rocephin 1g and Azithromycin 500mg  x1, albuterol nebulizer, and NS 1L bolus x1. Of note, she reports 1 sick contact--her sister who was admitted to the hospital for PNA and treated with abx. Patricia Cuevas visited her sister in the hospital last week who had similar symptoms of cough and fever.  Hospital Course by problem list: SIRS (systemic inflammatory response syndrome)-Resolved now. Uknown etiology. She has history of asthma but denies recent exacerbation. She has one know sick contact--her sister had pneumonia and was recently discharged from the hospital. She was febrile last night with Tmax 102.9 on admission, she had a mild leukocytosis, WBC: 11.1 with ANC 7.3, HR: 116, RR 18. Noted by RN for o2 sat dropping to 92% upon ambulating. CXR: low lung volumes with bibasilar atelectasis. U/A: >1000 glucose, moderate bilirubin, 40 ketones, 100 protein, trace leukocytes, small Hb, mucous present, 0-2 wbc and rbc, few bacteria, nitrite negative. Given negative influenza panel, and negative CXR, this is likely viral URI, CAP less likely. She was given IV Rocephin and Azithromycin x1 in ED and feels better today. Her WBC is slightly increased compared to yesterday but clinically she improved with no fever today. She had blood cultures pending upon her discharge. She will be discharged home with a follow up appointment with her PCP. She will continued Avelox upon her discharge. She might need a repeat chest Xray if her symptoms are worsening.   Hypertension--BP on  admission 122/69, responded to fluids. Noted to be 98/62 while in ED. On home regimen Prinzide 20-12.5mg . No orthostatic hypotension per recheck vitals but patient was given IVF in ED. BP stable/low overnight at 102/68. She is advised to not take this medication for now but  continued taking it once she increases her PO intake. She needs to follow up with her PCP on November 18th (appointment already scheduled).   Diabetes mellitus--glucose 293 on admission, morbidly obese, hx of gastric bypass. On home regimen: glipizide 10mg  which was held during this admission. Her CBG was elevated to the upper 200s during this hospitalization. She will likely need insulin as outpatient. Her HgbA1C is pending at the time of her discharge.   CAD (coronary artery disease)--She has history of MI, morbid obesity,  HTN, DM, HL, s/p CABG 2 vessel 2000,occluded graft with stents with MI~2011, followed by Dr. Myrtis Ser. Complaints of chest pain last week with cough. Cath 07/2010 (Dr. Riley Kill): normal LV function, patent LIMA to distal LAD with small caliber distal vessel that may demonstrate some diffuse plaque, patent SVG to OM 1 and OM2, occluded SVG to the diagonal, and preserved LV function. Recommend continuing medical therapy. Echo 01/2011: normal systolic function, EF 65%, no obvious regional wall motion abnormality, mild LVH with mild diastolic dysfunction, no obvious RVH, mildly enlarged left atrium, trace MR, TI, and mildly elevated pulmonic pressure. Nuclear stress test 01/2012 (Dr. Patty Sermons): normal stress nuclear study, LVEF 66%, normal LV function and normal wall motion. EKG on admission: 115bpm, sinus tachycardia, left atrial enlargement. On home regimen of: ASA, Prinzide, and Simvastatin. Trop poc 0.00 on admission. We continued her simvastatin, nitrostat PRN, and ASA. She will need close follow up with her PCP.   GERD (gastroesophageal reflux disease). Due to formulary options we gave her protonix. She had no complaints.  She will resume her home regimen Aciphex 20mg  upon her discharge.      Discharge Vitals:  BP 102/68  Pulse 105  Temp 98.9 F (37.2 C) (Oral)  Resp 18  Ht 4\' 11"  (1.499 m)  Wt 211 lb 3.2 oz (95.8 kg)  BMI 42.66 kg/m2  SpO2 96%  Discharge Labs:  Results for orders placed during the hospital encounter of 07/27/12 (from the past 24 hour(s))  CBC WITH DIFFERENTIAL     Status: Abnormal   Collection Time   07/27/12 10:08 PM      Component Value Range   WBC 11.1 (*) 4.0 - 10.5 K/uL   RBC 4.94  3.87 - 5.11 MIL/uL   Hemoglobin 14.0  12.0 - 15.0 g/dL   HCT 81.1  91.4 - 78.2 %   MCV 84.8  78.0 - 100.0 fL   MCH 28.3  26.0 - 34.0 pg   MCHC 33.4  30.0 - 36.0 g/dL   RDW 95.6 (*) 21.3 - 08.6 %   Platelets 185  150 - 400 K/uL   Neutrophils Relative 66  43 - 77 %   Neutro Abs 7.3  1.7 - 7.7 K/uL   Lymphocytes Relative 21  12 - 46 %   Lymphs Abs 2.3  0.7 - 4.0 K/uL   Monocytes Relative 12  3 - 12 %   Monocytes Absolute 1.4 (*) 0.1 - 1.0 K/uL   Eosinophils Relative 0  0 - 5 %   Eosinophils Absolute 0.0  0.0 - 0.7 K/uL   Basophils Relative 0  0 - 1 %   Basophils Absolute 0.0  0.0 - 0.1 K/uL  COMPREHENSIVE METABOLIC PANEL     Status: Abnormal   Collection Time   07/27/12 10:08 PM      Component Value Range   Sodium 128 (*) 135 - 145 mEq/L   Potassium 5.9 (*) 3.5 - 5.1 mEq/L   Chloride 94 (*) 96 - 112 mEq/L   CO2 18 (*) 19 - 32 mEq/L   Glucose, Bld 276 (*) 70 - 99 mg/dL   BUN 11  6 - 23 mg/dL   Creatinine, Ser 5.78  0.50 - 1.10 mg/dL   Calcium 9.0  8.4 - 46.9 mg/dL   Total Protein 7.8  6.0 - 8.3 g/dL   Albumin 3.3 (*) 3.5 - 5.2 g/dL   AST 51 (*) 0 - 37 U/L   ALT 21  0 - 35 U/L  Alkaline Phosphatase 81  39 - 117 U/L   Total Bilirubin 0.5  0.3 - 1.2 mg/dL   GFR calc non Af Amer 72 (*) >90 mL/min   GFR calc Af Amer 83 (*) >90 mL/min  POCT I-STAT TROPONIN I     Status: Normal   Collection Time   07/27/12 11:48 PM      Component Value Range   Troponin i, poc 0.00  0.00 - 0.08  ng/mL   Comment 3           URINALYSIS, ROUTINE W REFLEX MICROSCOPIC     Status: Abnormal   Collection Time   07/28/12 12:31 AM      Component Value Range   Color, Urine AMBER (*) YELLOW   APPearance CLOUDY (*) CLEAR   Specific Gravity, Urine 1.043 (*) 1.005 - 1.030   pH 5.5  5.0 - 8.0   Glucose, UA >1000 (*) NEGATIVE mg/dL   Hgb urine dipstick SMALL (*) NEGATIVE   Bilirubin Urine MODERATE (*) NEGATIVE   Ketones, ur 40 (*) NEGATIVE mg/dL   Protein, ur 161 (*) NEGATIVE mg/dL   Urobilinogen, UA 1.0  0.0 - 1.0 mg/dL   Nitrite NEGATIVE  NEGATIVE   Leukocytes, UA TRACE (*) NEGATIVE  URINE MICROSCOPIC-ADD ON     Status: Abnormal   Collection Time   07/28/12 12:31 AM      Component Value Range   Squamous Epithelial / LPF RARE  RARE   WBC, UA 0-2  <3 WBC/hpf   RBC / HPF 0-2  <3 RBC/hpf   Bacteria, UA FEW (*) RARE   Urine-Other MUCOUS PRESENT    POCT I-STAT, CHEM 8     Status: Abnormal   Collection Time   07/28/12 12:44 AM      Component Value Range   Sodium 135  135 - 145 mEq/L   Potassium 3.8  3.5 - 5.1 mEq/L   Chloride 101  96 - 112 mEq/L   BUN 10  6 - 23 mg/dL   Creatinine, Ser 0.96  0.50 - 1.10 mg/dL   Glucose, Bld 045 (*) 70 - 99 mg/dL   Calcium, Ion 4.09 (*) 1.13 - 1.30 mmol/L   TCO2 26  0 - 100 mmol/L   Hemoglobin 14.3  12.0 - 15.0 g/dL   HCT 81.1  91.4 - 78.2 %  INFLUENZA PANEL BY PCR     Status: Normal   Collection Time   07/28/12  1:52 AM      Component Value Range   Influenza A By PCR NEGATIVE  NEGATIVE   Influenza B By PCR NEGATIVE  NEGATIVE   H1N1 flu by pcr NOT DETECTED  NOT DETECTED  BASIC METABOLIC PANEL     Status: Abnormal   Collection Time   07/28/12  5:30 AM      Component Value Range   Sodium 134 (*) 135 - 145 mEq/L   Potassium 4.5  3.5 - 5.1 mEq/L   Chloride 98  96 - 112 mEq/L   CO2 19  19 - 32 mEq/L   Glucose, Bld 280 (*) 70 - 99 mg/dL   BUN 10  6 - 23 mg/dL   Creatinine, Ser 9.56  0.50 - 1.10 mg/dL   Calcium 8.6  8.4 - 21.3 mg/dL   GFR calc  non Af Amer 76 (*) >90 mL/min   GFR calc Af Amer 88 (*) >90 mL/min  CBC     Status: Abnormal   Collection Time   07/28/12  5:30 AM      Component Value Range   WBC 12.4 (*) 4.0 - 10.5 K/uL   RBC 4.70  3.87 - 5.11 MIL/uL   Hemoglobin 13.3  12.0 - 15.0 g/dL   HCT 81.1  91.4 - 78.2 %   MCV 85.1  78.0 - 100.0 fL   MCH 28.3  26.0 - 34.0 pg   MCHC 33.3  30.0 - 36.0 g/dL   RDW 95.6 (*) 21.3 - 08.6 %   Platelets 146 (*) 150 - 400 K/uL  HEMOGLOBIN A1C     Status: Abnormal   Collection Time   07/28/12  5:30 AM      Component Value Range   Hemoglobin A1C 8.8 (*) <5.7 %   Mean Plasma Glucose 206 (*) <117 mg/dL  HIV ANTIBODY (ROUTINE TESTING)     Status: Normal   Collection Time   07/28/12  5:30 AM      Component Value Range   HIV NON REACTIVE  NON REACTIVE  GLUCOSE, CAPILLARY     Status: Abnormal   Collection Time   07/28/12  7:16 AM      Component Value Range   Glucose-Capillary 253 (*) 70 - 99 mg/dL  GLUCOSE, CAPILLARY     Status: Abnormal   Collection Time   07/28/12 11:41 AM      Component Value Range   Glucose-Capillary 209 (*) 70 - 99 mg/dL    Signed: Sara Chu D 07/28/2012, 4:22 PM   Time Spent on Discharge: 40 minutes Services Ordered on Discharge: None Equipment Ordered on Discharge: None

## 2012-07-28 NOTE — ED Provider Notes (Signed)
Medical screening examination/treatment/procedure(s) were performed by non-physician practitioner and as supervising physician I was immediately available for consultation/collaboration.   David H Yao, MD 07/28/12 1502 

## 2012-07-28 NOTE — Progress Notes (Signed)
Inpatient Diabetes Program Recommendations  AACE/ADA: New Consensus Statement on Inpatient Glycemic Control (2013)  Target Ranges:  Prepandial:   less than 140 mg/dL      Peak postprandial:   less than 180 mg/dL (1-2 hours)      Critically ill patients:  140 - 180 mg/dL   Reason for Visit: Results for Patricia, Cuevas (MRN [id number]) as of 07/28/2012 09:43  Ref. Range 07/28/2012 07:16  Glucose-Capillary Latest Range: 70-99 mg/dL 409 (H)   Note W1X ordered.  According to health history, patient was on Glucotrol 10 mg daily prior to admit.  Please consider adding Lantus 20 units daily to determine pre-hospitalization glycemic control.  Will follow.

## 2012-07-28 NOTE — Progress Notes (Signed)
Subjective: She states feeling much better today. She has been afebrile today but with persistent cough.  She denies chest pain or dysuria. Objective: Vital signs in last 24 hours: Filed Vitals:   07/28/12 0445 07/28/12 0448 07/28/12 0451 07/28/12 1105  BP: 100/38 109/56 108/55 102/68  Pulse: 96 108 105   Temp: 98.7 F (37.1 C)   98.9 F (37.2 C)  TempSrc:    Oral  Resp: 20   18  Height: 4\' 11"  (1.499 m)     Weight: 211 lb 3.2 oz (95.8 kg)     SpO2: 96%   96%   Weight change:   Intake/Output Summary (Last 24 hours) at 07/28/12 1307 Last data filed at 07/28/12 1143  Gross per 24 hour  Intake      0 ml  Output    800 ml  Net   -800 ml   Vitals reviewed. General: morbidly obese woman, resting in bed, in NAD HEENT: no scleral icterus Cardiac: RRR, no rubs, murmurs or gallops Pulm: clear to auscultation bilaterally, no wheezes, rales, or rhonchi Abd: soft, nontender, nondistended, BS present Ext: warm and well perfused, no pedal edema Neuro: alert and oriented X3, cranial nerves II-XII grossly intact, strength and sensation to light touch equal in bilateral upper and lower extremities  Lab Results: Basic Metabolic Panel:  Lab 07/28/12 1027 07/28/12 0044 07/27/12 2208  NA 134* 135 --  K 4.5 3.8 --  CL 98 101 --  CO2 19 -- 18*  GLUCOSE 280* 293* --  BUN 10 10 --  CREATININE 0.81 1.10 --  CALCIUM 8.6 -- 9.0  MG -- -- --  PHOS -- -- --   Liver Function Tests:  Lab 07/27/12 2208  AST 51*  ALT 21  ALKPHOS 81  BILITOT 0.5  PROT 7.8  ALBUMIN 3.3*   CBC:  Lab 07/28/12 0530 07/28/12 0044 07/27/12 2208  WBC 12.4* -- 11.1*  NEUTROABS -- -- 7.3  HGB 13.3 14.3 --  HCT 40.0 42.0 --  MCV 85.1 -- 84.8  PLT 146* -- 185   CBG:  Lab 07/28/12 1141 07/28/12 0716  GLUCAP 209* 253*     Urinalysis:  Lab 07/28/12 0031  COLORURINE AMBER*  LABSPEC 1.043*  PHURINE 5.5  GLUCOSEU >1000*  HGBUR SMALL*  BILIRUBINUR MODERATE*  KETONESUR 40*  PROTEINUR 100*    UROBILINOGEN 1.0  NITRITE NEGATIVE  LEUKOCYTESUR TRACE*   07/28/12: Influenza A and B, and H1N1 by PCR negative 07/28/12: Urine culture, blood culture pending  Micro Results: No results found for this or any previous visit (from the past 240 hour(s)). Studies/Results: Dg Chest 2 View  07/27/2012  *RADIOLOGY REPORT*  Clinical Data: Chest pain  CHEST - 2 VIEW  Comparison: 06/08/2012  Findings: Low lung volumes with bibasilar atelectasis.  Prior CABG. Heart is normal size.  No effusions or acute bony abnormality.  IMPRESSION: Low lung volumes.  Bibasilar atelectasis.   Original Report Authenticated By: Charlett Nose, M.D.    Medications: I have reviewed the patient's current medications. Scheduled Meds:   . [COMPLETED] acetaminophen  975 mg Oral Once  . [COMPLETED] albuterol  5 mg Nebulization Once  . aspirin EC  81 mg Oral Daily  . [COMPLETED] azithromycin  500 mg Intravenous Once  . [COMPLETED] cefTRIAXone (ROCEPHIN)  IV  1 g Intravenous Once  . enoxaparin (LOVENOX) injection  40 mg Subcutaneous Q24H  . insulin aspart  0-9 Units Subcutaneous TID WC  . pantoprazole  40 mg Oral Daily  .  simvastatin  20 mg Oral q1800  . sodium chloride  1,000 mL Intravenous Once  . [COMPLETED] sodium chloride  1,000 mL Intravenous Once  . sodium chloride  3 mL Intravenous Q12H  . sucralfate  1 g Oral BID  . [DISCONTINUED] insulin aspart  0-9 Units Subcutaneous TID WC  . [DISCONTINUED] levalbuterol  0.63 mg Nebulization Q6H   Continuous Infusions:   . sodium chloride 125 mL/hr at 07/28/12 0549   PRN Meds:.acetaminophen, acetaminophen, albuterol, guaiFENesin-dextromethorphan, HYDROcodone-acetaminophen, nitroGLYCERIN, [DISCONTINUED] Hydrocodone-Acetaminophen, [DISCONTINUED] levalbuterol Assessment/Plan: Patricia Cuevas is a 61 year old female with PMH CAD, HTN, DM, and Asthma admitted for SIRS.   SIRS (systemic inflammatory response syndrome)--unknown etiology. Hx of asthma, denies recent exacerbation. 1  known sick contact--sister with PNA. Tmax 102.9 on admission, WBC: 11.1 with ANC 7.3, HR: 116, RR 18. Noted by RN for o2 sat dropping to 92% upon ambulating. CXR: low lung volumes with bibasilar atelectasis. U/A: >1000 glucose, moderate bilirubin, 40 ketones, 100 protein, trace leukocytes, small Hb, mucous present, 0-2 wbc and rbc, few bacteria, nitrite negative. Given negative influenza panel, and negative CXR, this is likely viral URI, CAP less likely. She was given IV Rocephin and Azithromycin x1 in ED and feels better today. He WBC is slightly increased compared to yesterday but clinically she improved with no fever today.  - D/cd droplet isolation given negative influenza panel - continuous pulse oximetry, she is saturating at 96% on RA - blood cx 2 pending--abx already started prior to draw  - robitussin prn cough  - Will consider repeat cxr if symptoms worsen  - IVF--NS@125cc /hr  - Tylenol PRN fever  - HIV screening pending - xopenex nebulizer scheduled and PRN   Hypertension--BP on admission 122/69, responding to fluids. Noted to be 98/62 while in ED. On home regimen Prinzide 20-12.5mg . No orthostatic vitals but patient was given IVF in ED. BP stable overnight at 102/68.  -IVF  -continue to monitor  -holding home BP meds   Diabetes mellitus--glucose 293 on admission, morbidly obese, hx of gastric bypass. On home regimen: glipizide 10mg . She will likely need insulin as outpatient.  -CBG monitoring  -SSI sensitive  -HbA1c pending  CAD (coronary artery disease)--hx of MI, morbid obesity, HTN, DM, HL, s/p CABG 2 vessel 2000,occluded graft with stents with MI~2011, followed by Dr. Myrtis Ser. Complaints of chest pain last week with cough. Cath 07/2010 (Dr. Riley Kill): normal LV function, patent LIMA to distal LAD with small caliber distal vessel that may demonstrate some diffuse plaque, patent SVG to OM 1 and OM2, occluded SVG to the diagonal, and preserved LV function. Recommend continuing medical  therapy. Echo 01/2011: normal systolic function, EF 65%, no obvious regional wall motion abnormality, mild LVH with mild diastolic dysfunction, no obvious RVH, mildly enlarged left atrium, trace MR, TI, and mildly elevated pulmonic pressure. Nuclear stress test 01/2012 (Dr. Patty Sermons): normal stress nuclear study, LVEF 66%, normal LV function and normal wall motion. EKG on admission: 115bpm, sinus tachycardia, left atrial enlargement. On home regimen of: ASA, Prinzide, and Simvastatin. Trop poc 0.00 on admission.  -simvastatin  -nitrostat PRN  -ASA  -hold Prinzide in the setting of hypotension   GERD (gastroesophageal reflux disease)--on home regimen Aciphex 20mg .  -protonix   Diet: heart healthy   DVT Ppx: Lovenox  Dispo: She will likely go home today as she continues to improve. She needs to follow up with her PCP.   The patient does have current PCP (VERTEFEUILLE,CYNTHIA K., NP), therefore will not require OPC follow-up  after discharge.  The patient does not have transportation limitations that hinder transportation to clinic appointments.    LOS: 1 day   Ky Barban 07/28/2012, 1:07 PM

## 2012-07-28 NOTE — Progress Notes (Signed)
UR Completed Paddy Walthall Graves-Bigelow, RN,BSN [phone]  

## 2012-07-29 LAB — URINE CULTURE: Colony Count: NO GROWTH

## 2012-08-03 LAB — CULTURE, BLOOD (ROUTINE X 2)
Culture: NO GROWTH
Culture: NO GROWTH

## 2012-08-27 ENCOUNTER — Encounter (HOSPITAL_COMMUNITY): Payer: Self-pay | Admitting: Emergency Medicine

## 2012-08-27 ENCOUNTER — Emergency Department (HOSPITAL_COMMUNITY): Payer: Medicaid Other

## 2012-08-27 ENCOUNTER — Emergency Department (HOSPITAL_COMMUNITY)
Admission: EM | Admit: 2012-08-27 | Discharge: 2012-08-27 | Disposition: A | Discharge status: Home / Self Care | Payer: Medicaid Other | Attending: Emergency Medicine | Admitting: Emergency Medicine

## 2012-08-27 DIAGNOSIS — R109 Unspecified abdominal pain: Secondary | ICD-10-CM | POA: Insufficient documentation

## 2012-08-27 DIAGNOSIS — E669 Obesity, unspecified: Secondary | ICD-10-CM | POA: Insufficient documentation

## 2012-08-27 DIAGNOSIS — Z860100 Personal history of colon polyps, unspecified: Secondary | ICD-10-CM | POA: Insufficient documentation

## 2012-08-27 DIAGNOSIS — J45909 Unspecified asthma, uncomplicated: Secondary | ICD-10-CM | POA: Insufficient documentation

## 2012-08-27 DIAGNOSIS — Z853 Personal history of malignant neoplasm of breast: Secondary | ICD-10-CM | POA: Insufficient documentation

## 2012-08-27 DIAGNOSIS — I252 Old myocardial infarction: Secondary | ICD-10-CM | POA: Insufficient documentation

## 2012-08-27 DIAGNOSIS — E785 Hyperlipidemia, unspecified: Secondary | ICD-10-CM | POA: Insufficient documentation

## 2012-08-27 DIAGNOSIS — Z951 Presence of aortocoronary bypass graft: Secondary | ICD-10-CM | POA: Insufficient documentation

## 2012-08-27 DIAGNOSIS — E119 Type 2 diabetes mellitus without complications: Secondary | ICD-10-CM | POA: Insufficient documentation

## 2012-08-27 DIAGNOSIS — Z87891 Personal history of nicotine dependence: Secondary | ICD-10-CM | POA: Insufficient documentation

## 2012-08-27 DIAGNOSIS — Z79899 Other long term (current) drug therapy: Secondary | ICD-10-CM | POA: Insufficient documentation

## 2012-08-27 DIAGNOSIS — Z7982 Long term (current) use of aspirin: Secondary | ICD-10-CM | POA: Insufficient documentation

## 2012-08-27 DIAGNOSIS — I10 Essential (primary) hypertension: Secondary | ICD-10-CM | POA: Insufficient documentation

## 2012-08-27 DIAGNOSIS — I2510 Atherosclerotic heart disease of native coronary artery without angina pectoris: Secondary | ICD-10-CM | POA: Insufficient documentation

## 2012-08-27 LAB — URINALYSIS, MICROSCOPIC ONLY
Leukocytes, UA: NEGATIVE
Nitrite: NEGATIVE
Specific Gravity, Urine: 1.029 (ref 1.005–1.030)
pH: 5.5 (ref 5.0–8.0)

## 2012-08-27 LAB — CBC WITH DIFFERENTIAL/PLATELET
Basophils Absolute: 0 10*3/uL (ref 0.0–0.1)
Basophils Relative: 0 % (ref 0–1)
Eosinophils Absolute: 0.1 10*3/uL (ref 0.0–0.7)
Eosinophils Relative: 1 % (ref 0–5)
MCH: 28.5 pg (ref 26.0–34.0)
MCHC: 32.9 g/dL (ref 30.0–36.0)
MCV: 86.7 fL (ref 78.0–100.0)
Monocytes Absolute: 0.4 10*3/uL (ref 0.1–1.0)
Platelets: 205 10*3/uL (ref 150–400)
RDW: 15.8 % — ABNORMAL HIGH (ref 11.5–15.5)
WBC: 6.7 10*3/uL (ref 4.0–10.5)

## 2012-08-27 LAB — COMPREHENSIVE METABOLIC PANEL
ALT: 14 U/L (ref 0–35)
AST: 20 U/L (ref 0–37)
Calcium: 9.8 mg/dL (ref 8.4–10.5)
Creatinine, Ser: 0.66 mg/dL (ref 0.50–1.10)
Sodium: 140 mEq/L (ref 135–145)
Total Protein: 7.2 g/dL (ref 6.0–8.3)

## 2012-08-27 MED ORDER — OXYCODONE-ACETAMINOPHEN 5-325 MG PO TABS: ORAL_TABLET | ORAL | Status: DC

## 2012-08-27 MED ORDER — OXYCODONE-ACETAMINOPHEN 5-325 MG PO TABS
2.0000 | ORAL_TABLET | Freq: Once | ORAL | Status: AC
  Administered 2012-08-27: 2 via ORAL
  Filled 2012-08-27: qty 2

## 2012-08-27 NOTE — ED Notes (Signed)
Patient returned from Ultrasound. 

## 2012-08-27 NOTE — ED Provider Notes (Signed)
History     CSN: [id number]  Arrival date & time 08/27/12  4098   First MD Initiated Contact with Patient 08/27/12 2029      Chief Complaint  Patient presents with  . Abdominal Pain    (Consider location/radiation/quality/duration/timing/severity/associated sxs/prior treatment) HPI  Patricia Cuevas is a 61 y.o. female complaining of epigastric abdominal pain described as severe, sharp, not exacerbated by eating. She denies any fever, nausea vomiting, change in bowel or bladder habits, chest pain, shortness of breath.  Past Medical History  Diagnosis Date  . Hx of CABG     2007  . Hypertension   . Diabetes mellitus   . Ejection fraction     EF 65%, echo, [place], Jan 16, 2011  . Sleep apnea     CPAP being arranged May, 2011  . Dyslipidemia   . CAD (coronary artery disease)     Nuclear stress test Feb 11, 2011, EF 64%, no scar or ischemia    //         catheterization, November, 2011,patent LIMA-LAD-small caliber distal vessel with diffuse plaque, patent SVG to OM1 and OM 2, patent SVG to PDA and PLA, occluded SVG to diagonal, medical therapy  . Overweight     stomach stapling 1985 followed by weight loss, then return of weight  . Tobacco abuse     in the past, resolved  . Alcohol abuse     in the past, resolved  . Nausea & vomiting     hospitalization, November, 2011, stricture GE junction, questionable stenosis gastrojejunostomy, disruptive primary peristaltic wave, GE reflux, delayed emptying proximal gastric pouch  . Hx of colonic polyps   . Asthma   . Myocardial infarction   . Breast cancer     Double mastectomy 2007; s/p tamoxifen and arimidex therapy; no recurrence since 05/2008    Past Surgical History  Procedure Date  . Coronary artery bypass graft   . Mastectomy   . Coronary stent placement   . Abdominal hysterectomy     Family History  Problem Relation Age of Onset  . Hypertension Mother   . Heart disease Mother   . Diabetes Mother   . Heart  disease Father   . Heart disease Sister   . Stomach cancer Sister   . Asthma Sister   . Colon cancer Father     History  Substance Use Topics  . Smoking status: Former Smoker -- 3.0 packs/day for 27 years    Types: Cigarettes    Quit date: 09/14/2000  . Smokeless tobacco: Never Used  . Alcohol Use: No     Comment: History of alcohol abuse but quit 5 years ago    OB History    Grav Para Term Preterm Abortions TAB SAB Ect Mult Living                  Review of Systems  Constitutional: Negative for fever.  Respiratory: Negative for shortness of breath.   Cardiovascular: Negative for chest pain.  Gastrointestinal: Negative for nausea, vomiting, abdominal pain and diarrhea.  All other systems reviewed and are negative.    Allergies  Review of patient's allergies indicates no known allergies.  Home Medications   Current Outpatient Rx  Name  Route  Sig  Dispense  Refill  . ALBUTEROL SULFATE HFA 108 (90 BASE) MCG/ACT IN AERS   Inhalation   Inhale 2 puffs into the lungs every 6 (six) hours as needed. For shortness of breath         .  ASPIRIN EC 81 MG PO TBEC   Oral   Take 81 mg by mouth daily.         [name] Kitchen GLIPIZIDE 10 MG PO TABS   Oral   Take 10 mg by mouth daily.         . GUAIFENESIN-DM 100-10 MG/5ML PO SYRP   Oral   Take 5 mLs by mouth every 4 (four) hours as needed for cough.   118 mL   0   . LISINOPRIL-HYDROCHLOROTHIAZIDE 20-12.5 MG PO TABS   Oral   Take 1 tablet by mouth daily.           [name] Kitchen NITROGLYCERIN 0.4 MG SL SUBL   Sublingual   Place 0.4 mg under the tongue every 5 (five) minutes as needed. For chest pain         . RABEPRAZOLE SODIUM 20 MG PO TBEC   Oral   Take 20 mg by mouth daily.         [name] Kitchen SIMVASTATIN 20 MG PO TABS   Oral   Take 20 mg by mouth daily.         . SUCRALFATE 1 GM/10ML PO SUSP   Oral   Take 1 g by mouth 2 (two) times daily.           BP 122/66  Pulse 83  Temp 98.2 F (36.8 C) (Oral)  Resp 16  Ht 4\' 11"   (1.499 m)  Wt 215 lb (97.523 kg)  BMI 43.42 kg/m2  SpO2 96%  Physical Exam  Nursing note and vitals reviewed. Constitutional: She is oriented to person, place, and time. She appears well-developed and well-nourished. No distress.  HENT:  Head: Normocephalic.  Eyes: Conjunctivae normal and EOM are normal. Pupils are equal, round, and reactive to light.  Cardiovascular: Normal rate.   Pulmonary/Chest: Effort normal and breath sounds normal. No stridor. No respiratory distress. She has no wheezes. She has no rales. She exhibits no tenderness.  Abdominal: Soft. Bowel sounds are normal. She exhibits no distension. There is tenderness.       Mild TTP of RUQ and epigastrum  Musculoskeletal: Normal range of motion.  Neurological: She is alert and oriented to person, place, and time.  Psychiatric: She has a normal mood and affect.    ED Course  Procedures (including critical care time)  Labs Reviewed  URINALYSIS, MICROSCOPIC ONLY - Abnormal; Notable for the following:    APPearance CLOUDY (*)     Bacteria, UA FEW (*)     Squamous Epithelial / LPF MANY (*)     Crystals CA OXALATE CRYSTALS (*)     All other components within normal limits  CBC WITH DIFFERENTIAL - Abnormal; Notable for the following:    RDW 15.8 (*)     Neutrophils Relative 38 (*)     Lymphocytes Relative 56 (*)     All other components within normal limits  COMPREHENSIVE METABOLIC PANEL - Abnormal; Notable for the following:    Glucose, Bld 169 (*)     Total Bilirubin 0.2 (*)     All other components within normal limits  LIPASE, BLOOD   US Abdomen Complete  08/27/2012  *RADIOLOGY REPORT*  Clinical Data:  The right-sided abdominal pain.  COMPLETE ABDOMINAL ULTRASOUND  Comparison:  CT abdomen and pelvis 05/21/2011.  Ultrasound abdomen 07/18/2010.  Findings:  Gallbladder:  The gallbladder is contracted, by history, the patient ate 2 hours ago and this is normal for a nonfasting patient.  No gallbladder wall thickening.  No stones are appreciated.  Common bile duct:  Normal caliber with measured diameter from 2.7- 4.6 mm.  Liver:  Diffusely increased echotexture throughout the liver consistent with fatty infiltration.  No focal lesions are appreciated.  Color flow Doppler imaging of the main portal vein shows flow in the appropriate direction.  IVC:  Not visualized due to overlying bowel gas.  Pancreas:  Portions of the head and tail the pancreas are obscured due to overlying bowel gas.  The pancreatic duct visualized in the body is at upper limits of normal, measuring 2.2 mm diameter.  Spleen:  Spleen length is measured at 2.3 cm.  Normal parenchymal echotexture.  Right Kidney:  Right kidney measures 11 cm length.  No hydronephrosis.  Left Kidney:  Left kidney measures 11.2 cm length.  No hydronephrosis.  Abdominal aorta:  Not visualized due to overlying bowel gas.  IMPRESSION: The gallbladder is contracted, normal for nonfasting patient. Diffuse fatty infiltration of the liver, stable since previous study.  Pancreatic duct diameter is upper limits of normal.   Original Report Authenticated By: Burman Nieves, M.D.      1. Abdominal pain       MDM  Patient was very benign abdominal exam. However she states her pain is quite severe she seems very comfortable.  Urinalysis and blood work showed no acute abnormalities. Ultrasound will be ordered.  Ultrasound shows a contracted gallbladder and with fatty infiltration of the liver.   Serial abdominal exams remained benign. Patient is tolerating by mouth and feels much better after pain medication.  Discussed case with attending who agrees with plan and stability to d/c to home.    Pt verbalized understanding and agrees with care plan. Outpatient follow-up and return precautions given.    New Prescriptions   OXYCODONE-ACETAMINOPHEN (PERCOCET/ROXICET) 5-325 MG PER TABLET    1 to 2 tabs PO q6hrs  PRN for pain          Wynetta Emery, PA-C 08/27/12 2256

## 2012-08-27 NOTE — ED Notes (Signed)
Pt discharged.Vital signs stable and GCS 15 

## 2012-08-27 NOTE — ED Notes (Signed)
Patient with abdominal pain since yesterday.  No nausea or vomiting.  No diarrhea.

## 2012-08-27 NOTE — ED Notes (Signed)
Pt request for IV team to draw her blood

## 2012-08-28 NOTE — ED Provider Notes (Signed)
Medical screening examination/treatment/procedure(s) were performed by non-physician practitioner and as supervising physician I was immediately available for consultation/collaboration.   Charles B. Bernette Mayers, MD 08/28/12 2132

## 2012-11-10 ENCOUNTER — Encounter (HOSPITAL_BASED_OUTPATIENT_CLINIC_OR_DEPARTMENT_OTHER): Payer: Self-pay

## 2012-11-10 ENCOUNTER — Emergency Department (HOSPITAL_BASED_OUTPATIENT_CLINIC_OR_DEPARTMENT_OTHER)
Admission: EM | Admit: 2012-11-10 | Discharge: 2012-11-10 | Disposition: A | Discharge status: Home / Self Care | Payer: Medicaid Other | Attending: Emergency Medicine | Admitting: Emergency Medicine

## 2012-11-10 ENCOUNTER — Emergency Department (HOSPITAL_BASED_OUTPATIENT_CLINIC_OR_DEPARTMENT_OTHER): Payer: Medicaid Other

## 2012-11-10 DIAGNOSIS — I252 Old myocardial infarction: Secondary | ICD-10-CM | POA: Insufficient documentation

## 2012-11-10 DIAGNOSIS — Z853 Personal history of malignant neoplasm of breast: Secondary | ICD-10-CM | POA: Insufficient documentation

## 2012-11-10 DIAGNOSIS — Z860100 Personal history of colon polyps, unspecified: Secondary | ICD-10-CM | POA: Insufficient documentation

## 2012-11-10 DIAGNOSIS — Z8669 Personal history of other diseases of the nervous system and sense organs: Secondary | ICD-10-CM | POA: Insufficient documentation

## 2012-11-10 DIAGNOSIS — Z87891 Personal history of nicotine dependence: Secondary | ICD-10-CM | POA: Insufficient documentation

## 2012-11-10 DIAGNOSIS — Z79899 Other long term (current) drug therapy: Secondary | ICD-10-CM | POA: Insufficient documentation

## 2012-11-10 DIAGNOSIS — I10 Essential (primary) hypertension: Secondary | ICD-10-CM | POA: Insufficient documentation

## 2012-11-10 DIAGNOSIS — J45909 Unspecified asthma, uncomplicated: Secondary | ICD-10-CM | POA: Insufficient documentation

## 2012-11-10 DIAGNOSIS — I2510 Atherosclerotic heart disease of native coronary artery without angina pectoris: Secondary | ICD-10-CM | POA: Insufficient documentation

## 2012-11-10 DIAGNOSIS — M79609 Pain in unspecified limb: Secondary | ICD-10-CM | POA: Insufficient documentation

## 2012-11-10 DIAGNOSIS — Z8679 Personal history of other diseases of the circulatory system: Secondary | ICD-10-CM | POA: Insufficient documentation

## 2012-11-10 DIAGNOSIS — R209 Unspecified disturbances of skin sensation: Secondary | ICD-10-CM | POA: Insufficient documentation

## 2012-11-10 DIAGNOSIS — E119 Type 2 diabetes mellitus without complications: Secondary | ICD-10-CM | POA: Insufficient documentation

## 2012-11-10 DIAGNOSIS — Z7982 Long term (current) use of aspirin: Secondary | ICD-10-CM | POA: Insufficient documentation

## 2012-11-10 DIAGNOSIS — Z951 Presence of aortocoronary bypass graft: Secondary | ICD-10-CM | POA: Insufficient documentation

## 2012-11-10 DIAGNOSIS — M25539 Pain in unspecified wrist: Secondary | ICD-10-CM | POA: Insufficient documentation

## 2012-11-10 DIAGNOSIS — E785 Hyperlipidemia, unspecified: Secondary | ICD-10-CM | POA: Insufficient documentation

## 2012-11-10 LAB — BASIC METABOLIC PANEL
BUN: 11 mg/dL (ref 6–23)
Chloride: 104 mEq/L (ref 96–112)
GFR calc Af Amer: >90 mL/min (ref >90)
GFR calc non Af Amer: >90 mL/min (ref >90)
Glucose, Bld: 126 mg/dL — ABNORMAL HIGH (ref 70–99)
Potassium: 3.9 mEq/L (ref 3.5–5.1)
Sodium: 140 mEq/L (ref 135–145)

## 2012-11-10 MED ORDER — HYDROCODONE-ACETAMINOPHEN 5-325 MG PO TABS
1.0000 | ORAL_TABLET | Freq: Once | ORAL | Status: AC
  Administered 2012-11-10: 1 via ORAL
  Filled 2012-11-10: qty 1

## 2012-11-10 MED ORDER — HYDROCODONE-ACETAMINOPHEN 5-325 MG PO TABS: 1.0000 | ORAL_TABLET | Freq: Four times a day (QID) | ORAL | Status: DC | PRN

## 2012-11-10 NOTE — ED Notes (Signed)
Pt returned from u/s

## 2012-11-10 NOTE — ED Provider Notes (Signed)
History     CSN: [id number]  Arrival date & time 11/10/12  4098   First MD Initiated Contact with Patient 11/10/12 1008      Chief Complaint  Patient presents with  . Numbness  . Wrist Pain    (Consider location/radiation/quality/duration/timing/severity/associated sxs/prior treatment) Patient is a 62 y.o. female presenting with wrist pain. The history is provided by the patient.  Wrist Pain This is a new problem. The current episode started yesterday. The problem occurs constantly. The problem has been gradually worsening. Associated symptoms comments: Noticed some swelling in the wrist and tingling in her fingers that started yesterday. Worse today and pain is shooting up the arm. The symptoms are aggravated by bending and twisting. The symptoms are relieved by rest. She has tried nothing for the symptoms. The treatment provided no relief.    Past Medical History  Diagnosis Date  . Hx of CABG     2007  . Hypertension   . Diabetes mellitus   . Ejection fraction     EF 65%, echo, [place], Jan 16, 2011  . Sleep apnea     CPAP being arranged May, 2011  . Dyslipidemia   . CAD (coronary artery disease)     Nuclear stress test Feb 11, 2011, EF 64%, no scar or ischemia    //         catheterization, November, 2011,patent LIMA-LAD-small caliber distal vessel with diffuse plaque, patent SVG to OM1 and OM 2, patent SVG to PDA and PLA, occluded SVG to diagonal, medical therapy  . Overweight     stomach stapling 1985 followed by weight loss, then return of weight  . Tobacco abuse     in the past, resolved  . Alcohol abuse     in the past, resolved  . Nausea & vomiting     hospitalization, November, 2011, stricture GE junction, questionable stenosis gastrojejunostomy, disruptive primary peristaltic wave, GE reflux, delayed emptying proximal gastric pouch  . Hx of colonic polyps   . Asthma   . Myocardial infarction   . Breast cancer     Double mastectomy 2007; s/p tamoxifen and  arimidex therapy; no recurrence since 05/2008    Past Surgical History  Procedure Laterality Date  . Coronary artery bypass graft    . Mastectomy    . Coronary stent placement    . Abdominal hysterectomy      Family History  Problem Relation Age of Onset  . Hypertension Mother   . Heart disease Mother   . Diabetes Mother   . Heart disease Father   . Heart disease Sister   . Stomach cancer Sister   . Asthma Sister   . Colon cancer Father     History  Substance Use Topics  . Smoking status: Former Smoker -- 3.00 packs/day for 27 years    Types: Cigarettes    Quit date: 09/14/2000  . Smokeless tobacco: Never Used  . Alcohol Use: No     Comment: History of alcohol abuse but quit 5 years ago    OB History   Grav Para Term Preterm Abortions TAB SAB Ect Mult Living                  Review of Systems  Constitutional: Negative for fever.  Musculoskeletal: Positive for joint swelling.  Neurological: Negative for weakness and numbness.       Some tingling in the first 3 fingers but denies true numbness and no weakness  All other  systems reviewed and are negative.    Allergies  Review of patient's allergies indicates no known allergies.  Home Medications   Current Outpatient Rx  Name  Route  Sig  Dispense  Refill  . albuterol (PROVENTIL HFA;VENTOLIN HFA) 108 (90 BASE) MCG/ACT inhaler   Inhalation   Inhale 2 puffs into the lungs every 6 (six) hours as needed. For shortness of breath         . aspirin EC 81 MG tablet   Oral   Take 81 mg by mouth daily.         [name] Kitchen glipiZIDE (GLUCOTROL) 10 MG tablet   Oral   Take 10 mg by mouth daily.         [name] Kitchen guaiFENesin-dextromethorphan (ROBITUSSIN DM) 100-10 MG/5ML syrup   Oral   Take 5 mLs by mouth every 4 (four) hours as needed for cough.   118 mL   0   . HYDROcodone-acetaminophen (NORCO/VICODIN) 5-325 MG per tablet   Oral   Take 1 tablet by mouth every 6 (six) hours as needed for pain.   15 tablet   0   .  lisinopril-hydrochlorothiazide (PRINZIDE,ZESTORETIC) 20-12.5 MG per tablet   Oral   Take 1 tablet by mouth daily.           . nitroGLYCERIN (NITROSTAT) 0.4 MG SL tablet   Sublingual   Place 0.4 mg under the tongue every 5 (five) minutes as needed. For chest pain         . oxyCODONE-acetaminophen (PERCOCET/ROXICET) 5-325 MG per tablet      1 to 2 tabs PO q6hrs  PRN for pain   15 tablet   0   . RABEprazole (ACIPHEX) 20 MG tablet   Oral   Take 20 mg by mouth daily.         . simvastatin (ZOCOR) 20 MG tablet   Oral   Take 20 mg by mouth daily.         . sucralfate (CARAFATE) 1 GM/10ML suspension   Oral   Take 1 g by mouth 2 (two) times daily.           BP 135/77  Pulse 75  Temp(Src) 98.1 F (36.7 C) (Oral)  Resp 18  Ht 4\' 11"  (1.499 m)  Wt 200 lb (90.719 kg)  BMI 40.37 kg/m2  SpO2 100%  Physical Exam  Nursing note and vitals reviewed. Constitutional: She is oriented to person, place, and time. She appears well-developed and well-nourished. No distress.  HENT:  Head: Normocephalic and atraumatic.  Eyes: EOM are normal. Pupils are equal, round, and reactive to light.  Cardiovascular: Normal rate, regular rhythm, normal heart sounds and intact distal pulses.   No murmur heard. Pulmonary/Chest: Effort normal and breath sounds normal. She has no wheezes. She has no rales.  Musculoskeletal:       Right wrist: She exhibits tenderness, swelling and effusion.       Arms: Pain with right wrist flexion and extension.  Swelling of the scapholunate joint. No erythema, induration or fluctuance.  2+ radial pulse and normal sensation in the fingers  Neurological: She is alert and oriented to person, place, and time. She has normal strength. No sensory deficit.  Skin: Skin is warm and dry. No erythema.    ED Course  Procedures (including critical care time)  Labs Reviewed  BASIC METABOLIC PANEL - Abnormal; Notable for the following:    Glucose, Bld 126 (*)    All  other components within normal  limits   Dg Wrist Complete Right  11/10/2012  *RADIOLOGY REPORT*  Clinical Data: Medial pain and swelling.  No trauma.  History breast cancer.  RIGHT WRIST - COMPLETE 3+ VIEW  Comparison: None.  Findings: Age advanced degenerative change about the radiocarpal joint, especially radially.  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 No acute fracture or dislocation.  Chronic deformity of the scaphoid, without acute fracture.  Widening of the scapholunate interval with proximal migration of the capitate.  IMPRESSION: Advanced degenerative changes about the carpus and radiocarpal joint.  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  Proximal capitate migration is likely secondary.   Original Report Authenticated By: Jeronimo Greaves, M.D.    US Venous Img Upper Uni Right  11/10/2012  *RADIOLOGY REPORT*  Clinical Data:  Right upper extremity numbness and swelling  RIGHT UPPER EXTREMITY VENOUS DUPLEX ULTRASOUND  Technique:  Gray-scale sonography with graded compression, as well as color Doppler and duplex ultrasound were performed to evaluate the upper extremity deep venous system from the level of the subclavian vein and including the jugular, axillary, basilic and upper cephalic vein.  Spectral Doppler was utilized to evaluate flow at rest and with distal augmentation maneuvers.  Comparison:  None.  Findings:  Normal compressibility of the upper extremity deep veins is demonstrated.  No venous filling defects visualized on grayscale or color Doppler US.  Normal direction of flow is seen throughout the deep veins.  Spectral Doppler waveforms show normal morphology at rest and with distal augmentation.  IMPRESSION: No evidence of upper extremity deep venous thrombosis.   Original Report Authenticated By: Malachy Moan, M.D.      1. Wrist pain, acute, right       MDM   Patient  here complaining of right wrist pain, swelling and pain up her arm that started last night. She denies any fever or neurologic symptoms. She does have notable swelling of the wrist today and pain with flexion and extension of the wrist. No warmth or erythema of the joints and normal elbow and shoulder joints. Patient does have a prior history of breast cancer and DVT. Last on Coumadin 2 years ago. Upper extremity ultrasound shows no sign of venous thrombosis and plain film shows advanced degenerative changes about the carpus and radiocarpal joints. Also widening of the scapholunate interval suggesting chronic ligamentous insufficiency. This is the area where she is having the pain and swelling. She was placed in a wrist splint and given followup with hand.  Pain was much improved after one Vicodin and she was discharged home with a prescription        Gwyneth Sprout, MD 11/10/12 1222

## 2012-11-10 NOTE — ED Notes (Signed)
Pt reports she developed pain and numbness/tingling in right wrist radiating to upper arm.  Swollen area noted on medical aspect of wrist.

## 2012-11-10 NOTE — ED Notes (Signed)
MD at bedside. 

## 2012-11-10 NOTE — Progress Notes (Signed)
velcro wrist & forearm splint applied to patient's right wrist & forearm.

## 2013-01-06 ENCOUNTER — Encounter (HOSPITAL_COMMUNITY): Payer: Self-pay | Admitting: Adult Health

## 2013-01-06 ENCOUNTER — Emergency Department (HOSPITAL_COMMUNITY): Payer: Medicaid Other

## 2013-01-06 ENCOUNTER — Observation Stay (HOSPITAL_COMMUNITY)
Admission: EM | Admit: 2013-01-06 | Discharge: 2013-01-07 | Disposition: A | Discharge status: Home / Self Care | Payer: Medicaid Other | Attending: Internal Medicine | Admitting: Internal Medicine

## 2013-01-06 DIAGNOSIS — I25119 Atherosclerotic heart disease of native coronary artery with unspecified angina pectoris: Secondary | ICD-10-CM | POA: Diagnosis present

## 2013-01-06 DIAGNOSIS — I2510 Atherosclerotic heart disease of native coronary artery without angina pectoris: Secondary | ICD-10-CM | POA: Insufficient documentation

## 2013-01-06 DIAGNOSIS — K219 Gastro-esophageal reflux disease without esophagitis: Secondary | ICD-10-CM | POA: Insufficient documentation

## 2013-01-06 DIAGNOSIS — R0789 Other chest pain: Principal | ICD-10-CM | POA: Insufficient documentation

## 2013-01-06 DIAGNOSIS — Z9010 Acquired absence of unspecified breast and nipple: Secondary | ICD-10-CM | POA: Insufficient documentation

## 2013-01-06 DIAGNOSIS — Z9861 Coronary angioplasty status: Secondary | ICD-10-CM | POA: Insufficient documentation

## 2013-01-06 DIAGNOSIS — R0602 Shortness of breath: Secondary | ICD-10-CM | POA: Insufficient documentation

## 2013-01-06 DIAGNOSIS — R079 Chest pain, unspecified: Secondary | ICD-10-CM

## 2013-01-06 DIAGNOSIS — E119 Type 2 diabetes mellitus without complications: Secondary | ICD-10-CM | POA: Insufficient documentation

## 2013-01-06 DIAGNOSIS — Z853 Personal history of malignant neoplasm of breast: Secondary | ICD-10-CM | POA: Insufficient documentation

## 2013-01-06 DIAGNOSIS — E785 Hyperlipidemia, unspecified: Secondary | ICD-10-CM | POA: Insufficient documentation

## 2013-01-06 DIAGNOSIS — I10 Essential (primary) hypertension: Secondary | ICD-10-CM | POA: Insufficient documentation

## 2013-01-06 DIAGNOSIS — Z87891 Personal history of nicotine dependence: Secondary | ICD-10-CM

## 2013-01-06 DIAGNOSIS — Z951 Presence of aortocoronary bypass graft: Secondary | ICD-10-CM | POA: Insufficient documentation

## 2013-01-06 DIAGNOSIS — G4733 Obstructive sleep apnea (adult) (pediatric): Secondary | ICD-10-CM | POA: Insufficient documentation

## 2013-01-06 DIAGNOSIS — C50919 Malignant neoplasm of unspecified site of unspecified female breast: Secondary | ICD-10-CM | POA: Diagnosis present

## 2013-01-06 LAB — BASIC METABOLIC PANEL
BUN: 12 mg/dL (ref 6–23)
CO2: 28 mEq/L (ref 19–32)
GFR calc non Af Amer: 89 mL/min — ABNORMAL LOW (ref >90)
Glucose, Bld: 100 mg/dL — ABNORMAL HIGH (ref 70–99)
Potassium: 3.9 mEq/L (ref 3.5–5.1)
Sodium: 141 mEq/L (ref 135–145)

## 2013-01-06 LAB — POCT I-STAT TROPONIN I: Troponin i, poc: 0 ng/mL (ref 0.00–0.08)

## 2013-01-06 LAB — D-DIMER, QUANTITATIVE: D-Dimer, Quant: <0.27 ug/mL-FEU (ref 0.00–0.48)

## 2013-01-06 LAB — CBC
HCT: 42.9 % (ref 36.0–46.0)
Hemoglobin: 14.7 g/dL (ref 12.0–15.0)
MCH: 28.7 pg (ref 26.0–34.0)
MCHC: 34.3 g/dL (ref 30.0–36.0)
RBC: 5.12 MIL/uL — ABNORMAL HIGH (ref 3.87–5.11)

## 2013-01-06 LAB — GLUCOSE, CAPILLARY: Glucose-Capillary: 100 mg/dL — ABNORMAL HIGH (ref 70–99)

## 2013-01-06 NOTE — ED Notes (Signed)
Pt ambulatory to bathroom

## 2013-01-06 NOTE — ED Notes (Signed)
Pt placed on heart monitor.

## 2013-01-06 NOTE — ED Provider Notes (Signed)
History     CSN: [id number]  Arrival date & time 01/06/13  1941   First MD Initiated Contact with Patient 01/06/13 2103      Chief Complaint  Patient presents with  . Shortness of Breath    (Consider location/radiation/quality/duration/timing/severity/associated sxs/prior treatment) HPI Comments: Patient with multiple complaints including tingling in her bilateral feet, intermittent left-sided chest pain lasts 3 minutes at a time and resolves spontaneously. His pain is worse with palpation. She has a history of MI with CABG. This pain is different than her previous chest pain. She endorses shortness of breath is worse with exertion and lying flat it is not going on for quite some time. She also endorses a dry mouth. Good by mouth intake and urine output. No nausea vomiting.  The history is provided by the patient.    Past Medical History  Diagnosis Date  . Hx of CABG     2007  . Hypertension   . Diabetes mellitus   . Ejection fraction     EF 65%, echo, [place], Jan 16, 2011  . Sleep apnea     CPAP being arranged May, 2011  . Dyslipidemia   . CAD (coronary artery disease)     Nuclear stress test Feb 11, 2011, EF 64%, no scar or ischemia    //         catheterization, November, 2011,patent LIMA-LAD-small caliber distal vessel with diffuse plaque, patent SVG to OM1 and OM 2, patent SVG to PDA and PLA, occluded SVG to diagonal, medical therapy  . Overweight     stomach stapling 1985 followed by weight loss, then return of weight  . Tobacco abuse     in the past, resolved  . Alcohol abuse     in the past, resolved  . Nausea & vomiting     hospitalization, November, 2011, stricture GE junction, questionable stenosis gastrojejunostomy, disruptive primary peristaltic wave, GE reflux, delayed emptying proximal gastric pouch  . Hx of colonic polyps   . Asthma   . Myocardial infarction   . Breast cancer     Double mastectomy 2007; s/p tamoxifen and arimidex therapy; no recurrence  since 05/2008    Past Surgical History  Procedure Laterality Date  . Coronary artery bypass graft    . Mastectomy    . Coronary stent placement    . Abdominal hysterectomy      Family History  Problem Relation Age of Onset  . Hypertension Mother   . Heart disease Mother   . Diabetes Mother   . Heart disease Father   . Heart disease Sister   . Stomach cancer Sister   . Asthma Sister   . Colon cancer Father     History  Substance Use Topics  . Smoking status: Former Smoker -- 3.00 packs/day for 27 years    Types: Cigarettes    Quit date: 09/14/2000  . Smokeless tobacco: Never Used  . Alcohol Use: No     Comment: History of alcohol abuse but quit 5 years ago    OB History   Grav Para Term Preterm Abortions TAB SAB Ect Mult Living                  Review of Systems  Constitutional: Positive for activity change and appetite change. Negative for fever.  HENT: Negative for congestion and rhinorrhea.   Respiratory: Positive for chest tightness and shortness of breath.   Cardiovascular: Positive for chest pain.  Gastrointestinal: Negative for nausea,  vomiting and abdominal pain.  Genitourinary: Negative for dysuria, hematuria, vaginal bleeding and vaginal discharge.  Musculoskeletal: Negative for back pain.  Neurological: Negative for dizziness, weakness and headaches.   A complete 10 system review of systems was obtained and all systems are negative except as noted in the HPI and PMH.   Allergies  Review of patient's allergies indicates no known allergies.  Home Medications   Current Outpatient Rx  Name  Route  Sig  Dispense  Refill  . albuterol (PROVENTIL HFA;VENTOLIN HFA) 108 (90 BASE) MCG/ACT inhaler   Inhalation   Inhale 2 puffs into the lungs every 6 (six) hours as needed. For shortness of breath         . aspirin EC 81 MG tablet   Oral   Take 81 mg by mouth every other day.          [name] Kitchen glipiZIDE (GLUCOTROL) 10 MG tablet   Oral   Take 10 mg by mouth  daily.         [name] Kitchen lisinopril-hydrochlorothiazide (PRINZIDE,ZESTORETIC) 20-12.5 MG per tablet   Oral   Take 1 tablet by mouth daily.           . RABEprazole (ACIPHEX) 20 MG tablet   Oral   Take 20 mg by mouth daily.         . simvastatin (ZOCOR) 20 MG tablet   Oral   Take 20 mg by mouth at bedtime.          . sucralfate (CARAFATE) 1 GM/10ML suspension   Oral   Take 1 g by mouth every morning.            BP 155/76  Pulse 70  Temp(Src) 98.1 F (36.7 C) (Oral)  Resp 18  SpO2 97%  Physical Exam  Constitutional: She is oriented to person, place, and time. She appears well-developed and well-nourished. No distress.  HENT:  Head: Normocephalic and atraumatic.  Mouth/Throat: Oropharynx is clear and moist. No oropharyngeal exudate.  Eyes: Conjunctivae and EOM are normal. Pupils are equal, round, and reactive to light.  Neck: Normal range of motion. Neck supple.  Cardiovascular: Normal rate, regular rhythm and normal heart sounds.   Pulmonary/Chest: Effort normal and breath sounds normal. No respiratory distress. She exhibits tenderness.  Reproducible left-sided chest wall tenderness  Abdominal: Soft. There is no tenderness. There is no rebound and no guarding.  Musculoskeletal: Normal range of motion. She exhibits no edema and no tenderness.  Neurological: She is alert and oriented to person, place, and time. No cranial nerve deficit. She exhibits normal muscle tone. Coordination normal.  Skin: Skin is warm.    ED Course  Procedures (including critical care time)  Labs Reviewed  CBC - Abnormal; Notable for the following:    RBC 5.12 (*)    All other components within normal limits  BASIC METABOLIC PANEL - Abnormal; Notable for the following:    Glucose, Bld 100 (*)    GFR calc non Af Amer 89 (*)    All other components within normal limits  GLUCOSE, CAPILLARY - Abnormal; Notable for the following:    Glucose-Capillary 100 (*)    All other components within  normal limits  PRO B NATRIURETIC PEPTIDE  D-DIMER, QUANTITATIVE  PRO B NATRIURETIC PEPTIDE  URINALYSIS, ROUTINE W REFLEX MICROSCOPIC  POCT I-STAT TROPONIN I   Dg Chest 2 View  01/06/2013  *RADIOLOGY REPORT*  Clinical Data: Chest pain and shortness of breath.  CHEST - 2 VIEW  Comparison: 07/27/2012  Findings: Cardiomediastinal silhouette is stable with CABG changes. Elevation of the right hemidiaphragm is again noted. There is no evidence of focal airspace disease, pulmonary edema, suspicious pulmonary nodule/mass, pleural effusion, or pneumothorax. No acute bony abnormalities are identified. Clips within the right axilla are again identified.  IMPRESSION: No evidence of acute cardiopulmonary disease.   Original Report Authenticated By: Harmon Pier, M.D.      No diagnosis found.    MDM  Patient complains of intermittent left-sided sharp stabbing chest pain that started this morning. She reports it lasted for minutes at a time. She also endorses shortness of breath and difficulty lying flat but appears as a chronic problem. She also endorses dry mouth and a "funny feeling in her chest.  She does have reproducible chest tenderness on exam. EKG reveals T-wave inversions in V2 and V3. She has a remote history of CABG and no stress testing since 2012.  Her chest pain sounds musculoskeletal but she has extensive risk factors with previous history of CAD, diabetes, history of cancer, hyperlipidemia and tobacco abuse.  D-dimer is negative making PE and dissection less likely. Discussed with Dr. Adolm Joseph of cardiology who agrees to admit patient for observation.    Date: 01/06/2013  Rate: 83  Rhythm: normal sinus rhythm  QRS Axis: normal  Intervals: normal  ST/T Wave abnormalities: nonspecific ST/T changes  Conduction Disutrbances:none  Narrative Interpretation: new T wave inversions in V2 and V3  Old EKG Reviewed: changes noted       Glynn Octave, MD 01/07/13 0000

## 2013-01-06 NOTE — H&P (Addendum)
History and Physical  Patient ID: TIPPI MCCRAE MRN: [id number], SOB: [birth year] 62 y.o. Date of Encounter: 01/06/2013, 11:32 PM  Primary Physician: Aurelio Brash., NP Primary Cardiologist: Dr. Myrtis Ser  Chief Complaint: chest pain  HPI: 62 y.o. female w/ PMHx significant for CAD s/p CABG 2009, HTN, DM2, Breast ca s/p bilat mastectomy who presented to [facility] on 01/06/2013 with complaints of chest pain.  She is a relatively poor historian and I have trouble following what is acute issues versus chronic stable issues. She reports what sounds like a long standing problem of shortness and decreased exercise tolerance. What sounds new is several symptoms: tingling in her feet, dry mouth and feeling tired. Her cardiac complaints is intermittent chest pain. Difficult to describe but it is left sided, lasts a few minutes. Unclear what provokes it or relieves it. No nitro at home.  EKG revealed NSR with TWI in V2, V3 which is slightly different from flattened TW in 07/2012 EKG. CXR was without acute cardiopulmonary abnormalities. Neg troponin, d-dimer, troponin.    Past Medical History  Diagnosis Date  . Hx of CABG     2007  . Hypertension   . Diabetes mellitus   . Ejection fraction     EF 65%, echo, [place], Jan 16, 2011  . Sleep apnea     CPAP being arranged May, 2011  . Dyslipidemia   . CAD (coronary artery disease)     Nuclear stress test Feb 11, 2011, EF 64%, no scar or ischemia    //         catheterization, November, 2011,patent LIMA-LAD-small caliber distal vessel with diffuse plaque, patent SVG to OM1 and OM 2, patent SVG to PDA and PLA, occluded SVG to diagonal, medical therapy  . Overweight     stomach stapling 1985 followed by weight loss, then return of weight  . Tobacco abuse     in the past, resolved  . Alcohol abuse     in the past, resolved  . Nausea & vomiting     hospitalization, November, 2011, stricture GE junction, questionable stenosis  gastrojejunostomy, disruptive primary peristaltic wave, GE reflux, delayed emptying proximal gastric pouch  . Hx of colonic polyps   . Asthma   . Myocardial infarction   . Breast cancer     Double mastectomy 2007; s/p tamoxifen and arimidex therapy; no recurrence since 05/2008     Surgical History:  Past Surgical History  Procedure Laterality Date  . Coronary artery bypass graft    . Mastectomy    . Coronary stent placement    . Abdominal hysterectomy       Home Meds: Prior to Admission medications   Medication Sig Start Date End Date Taking? Authorizing Provider  albuterol (PROVENTIL HFA;VENTOLIN HFA) 108 (90 BASE) MCG/ACT inhaler Inhale 2 puffs into the lungs every 6 (six) hours as needed. For shortness of breath   Yes Historical Provider, MD  aspirin EC 81 MG tablet Take 81 mg by mouth every other day.    Yes Historical Provider, MD  glipiZIDE (GLUCOTROL) 10 MG tablet Take 10 mg by mouth daily.   Yes Historical Provider, MD  lisinopril-hydrochlorothiazide (PRINZIDE,ZESTORETIC) 20-12.5 MG per tablet Take 1 tablet by mouth daily.     Yes Historical Provider, MD  RABEprazole (ACIPHEX) 20 MG tablet Take 20 mg by mouth daily.   Yes Historical Provider, MD  simvastatin (ZOCOR) 20 MG tablet Take 20 mg by mouth at bedtime.    Yes Historical Provider,  MD  sucralfate (CARAFATE) 1 GM/10ML suspension Take 1 g by mouth every morning.    Yes Historical Provider, MD    Allergies: No Known Allergies  History   Social History  . Marital Status: Single    Spouse Name: N/A    Number of Children: N/A  . Years of Education: N/A   Occupational History  . Disabled    Social History Main Topics  . Smoking status: Former Smoker -- 3.00 packs/day for 27 years    Types: Cigarettes    Quit date: 09/14/2000  . Smokeless tobacco: Never Used  . Alcohol Use: No     Comment: History of alcohol abuse but quit 5 years ago  . Drug Use: No     Comment: Used to use Marijuan but stopped 5 years ago.    [name] Kitchen Sexually Active: Not on file   Other Topics Concern  . Not on file   Social History Narrative  . No narrative on file     Family History  Problem Relation Age of Onset  . Hypertension Mother   . Heart disease Mother   . Diabetes Mother   . Heart disease Father   . Heart disease Sister   . Stomach cancer Sister   . Asthma Sister   . Colon cancer Father     Review of Systems: General: negative for chills, fever, night sweats or weight changes.  Cardiovascular: see HPI Dermatological: negative for rash Respiratory: negative for cough or wheezing Urologic: negative for hematuria Abdominal: negative for nausea, vomiting, diarrhea, bright red blood per rectum, melena, or hematemesis Neurologic: negative for visual changes, syncope, or dizziness All other systems reviewed and are otherwise negative except as noted above.  Labs:   Lab Results  Component Value Date   WBC 7.0 01/06/2013   HGB 14.7 01/06/2013   HCT 42.9 01/06/2013   MCV 83.8 01/06/2013   PLT 207 01/06/2013    Recent Labs Lab 01/06/13 1956  NA 141  K 3.9  CL 102  CO2 28  BUN 12  CREATININE 0.72  CALCIUM 10.2  GLUCOSE 100*   No results found for this basename: CKTOTAL, CKMB, TROPONINI,  in the last 72 hours Lab Results  Component Value Date   CHOL  Value: 139        ATP III CLASSIFICATION:  <200     mg/dL   Desirable  409-811  mg/dL   Borderline High  >=914    mg/dL   High        03/22/2955   HDL 41 05/17/2009   LDLCALC  Value: 79        Total Cholesterol/HDL:CHD Risk Coronary Heart Disease Risk Table                     Men   Women  1/2 Average Risk   3.4   3.3  Average Risk       5.0   4.4  2 X Average Risk   9.6   7.1  3 X Average Risk  23.4   11.0        Use the calculated Patient Ratio above and the CHD Risk Table to determine the patient's CHD Risk.        ATP III CLASSIFICATION (LDL):  <100     mg/dL   Optimal  213-086  mg/dL   Near or Above  Optimal  130-159  mg/dL   Borderline   308-657  mg/dL   High  >846     mg/dL   Very High 05/21/2951   TRIG 93 05/17/2009   Lab Results  Component Value Date   DDIMER <0.27 01/06/2013    Radiology/Studies:  Dg Chest 2 View  01/06/2013  *RADIOLOGY REPORT*  Clinical Data: Chest pain and shortness of breath.  CHEST - 2 VIEW  Comparison: 07/27/2012  Findings: Cardiomediastinal silhouette is stable with CABG changes. Elevation of the right hemidiaphragm is again noted. There is no evidence of focal airspace disease, pulmonary edema, suspicious pulmonary nodule/mass, pleural effusion, or pneumothorax. No acute bony abnormalities are identified. Clips within the right axilla are again identified.  IMPRESSION: No evidence of acute cardiopulmonary disease.   Original Report Authenticated By: Harmon Pier, M.D.      EKG: sinus, TWI in V2, V3  Physical Exam: Blood pressure 125/80, pulse 72, temperature 98.1 F (36.7 C), temperature source Oral, resp. rate 18, SpO2 100.00%. General: Well developed, well nourished, in no acute distress. Head: Normocephalic, atraumatic, sclera non-icteric, nares are without discharge Neck: Supple. Negative for carotid bruits. JVD not elevated. Lungs: Clear bilaterally to auscultation without wheezes, rales, or rhonchi. Breathing is unlabored. Bilateral mastectomy Heart: RRR with S1 S2. No murmurs, rubs, or gallops appreciated. Abdomen: Soft, non-tender, non-distended with normoactive bowel sounds. No rebound/guarding. No obvious abdominal masses. Msk:  Strength and tone appear normal for age. Extremities: No edema. No clubbing or cyanosis. Distal pedal pulses are 2+ and equal bilaterally. Neuro: Alert and oriented X 3. Moves all extremities spontaneously. Psych:  Responds to questions appropriately with a normal affect.   Problem List 1. Chest pain, typical and atypical features 2. CAD s/p CABG 2009 3. DM2 4. Hyperlipidemia, on therapy 5. HTN, controlled  ASSESSMENT AND PLAN:  62 y.o. female w/ PMHx  significant for CAD s/p CABG 2009, HTN, DM2, Breast ca s/p bilat mastectomy who presented to [facility] on 01/06/2013 with complaints of chest pain. Chest pain has typical but also significant atypical features which reduces my suspicion that this is cardiac. However, given her extensive risk factor profile (DM2, CAD, HTN, Hyperlipidemia) and nonspecific EKG changes,  most prudent approach is to admit and monitor. Other serious etiologies of chest pain significantly reduced with negative d-dimer.  In the AM, if enzymes remain negative and she is chest pain free with ambulation, reasonable to discharge. However, stress testing could also be considered.  Admit to obs telemetry. Serial enzymes, EKG. Continue statin, aspirin, ACEI, HCTZ. No indication for heparin unless recurrent sxs, EKG changes or +biomarkers.  NPO just in case stress testing is desired.  Hold glipizide, sliding scale while in hospital.  Prophylaxis: lovenox NPO for cath On PPI. Full code. Signed, Georgetta Haber MD 01/06/2013, 11:32 PM

## 2013-01-06 NOTE — ED Notes (Signed)
Presents With intermittent SOB, dry mouth and constant sharp, left sided chest pain that began this am. SOB is worse with exertion and lying flat. Chest pain radiates up into shoulder and neck.  Pt staes, "I feel funny and my mouth is real dry"

## 2013-01-07 ENCOUNTER — Encounter (HOSPITAL_COMMUNITY): Payer: Self-pay | Admitting: *Deleted

## 2013-01-07 DIAGNOSIS — R079 Chest pain, unspecified: Secondary | ICD-10-CM

## 2013-01-07 DIAGNOSIS — Z87891 Personal history of nicotine dependence: Secondary | ICD-10-CM

## 2013-01-07 DIAGNOSIS — R0789 Other chest pain: Secondary | ICD-10-CM

## 2013-01-07 LAB — URINE MICROSCOPIC-ADD ON

## 2013-01-07 LAB — URINALYSIS, ROUTINE W REFLEX MICROSCOPIC
Bilirubin Urine: NEGATIVE
Glucose, UA: NEGATIVE mg/dL
Hgb urine dipstick: NEGATIVE
Ketones, ur: NEGATIVE mg/dL
Protein, ur: NEGATIVE mg/dL
Urobilinogen, UA: 1 mg/dL (ref 0.0–1.0)

## 2013-01-07 LAB — BASIC METABOLIC PANEL
CO2: 29 mEq/L (ref 19–32)
Calcium: 9.7 mg/dL (ref 8.4–10.5)
Creatinine, Ser: 0.76 mg/dL (ref 0.50–1.10)
Glucose, Bld: 167 mg/dL — ABNORMAL HIGH (ref 70–99)

## 2013-01-07 LAB — GLUCOSE, CAPILLARY: Glucose-Capillary: 129 mg/dL — ABNORMAL HIGH (ref 70–99)

## 2013-01-07 LAB — TROPONIN I: Troponin I: <0.3 ng/mL

## 2013-01-07 MED ORDER — SIMVASTATIN 20 MG PO TABS
20.0000 mg | ORAL_TABLET | Freq: Every day | ORAL | Status: DC
  Filled 2013-01-07: qty 1

## 2013-01-07 MED ORDER — ASPIRIN EC 81 MG PO TBEC: 81.0000 mg | DELAYED_RELEASE_TABLET | ORAL | Status: DC

## 2013-01-07 MED ORDER — ALBUTEROL SULFATE HFA 108 (90 BASE) MCG/ACT IN AERS: 2.0000 | INHALATION_SPRAY | Freq: Four times a day (QID) | RESPIRATORY_TRACT | Status: DC | PRN

## 2013-01-07 MED ORDER — ASPIRIN EC 81 MG PO TBEC
81.0000 mg | DELAYED_RELEASE_TABLET | Freq: Every day | ORAL | Status: DC
  Administered 2013-01-07: 81 mg via ORAL
  Filled 2013-01-07: qty 1

## 2013-01-07 MED ORDER — ONDANSETRON HCL 4 MG/2ML IJ SOLN: 4.0000 mg | Freq: Four times a day (QID) | INTRAMUSCULAR | Status: DC | PRN

## 2013-01-07 MED ORDER — LISINOPRIL-HYDROCHLOROTHIAZIDE 20-12.5 MG PO TABS: 1.0000 | ORAL_TABLET | Freq: Every day | ORAL | Status: DC

## 2013-01-07 MED ORDER — NITROGLYCERIN 0.4 MG SL SUBL: 0.4000 mg | SUBLINGUAL_TABLET | SUBLINGUAL | Status: DC | PRN

## 2013-01-07 MED ORDER — ENOXAPARIN SODIUM 30 MG/0.3ML ~~LOC~~ SOLN: 30.0000 mg | SUBCUTANEOUS | Status: DC

## 2013-01-07 MED ORDER — HYDROCHLOROTHIAZIDE 12.5 MG PO CAPS
12.5000 mg | ORAL_CAPSULE | Freq: Every day | ORAL | Status: DC
  Administered 2013-01-07: 12.5 mg via ORAL
  Filled 2013-01-07: qty 1

## 2013-01-07 MED ORDER — LISINOPRIL 20 MG PO TABS
20.0000 mg | ORAL_TABLET | Freq: Every day | ORAL | Status: DC
  Administered 2013-01-07: 20 mg via ORAL
  Filled 2013-01-07: qty 1

## 2013-01-07 MED ORDER — ACETAMINOPHEN 325 MG PO TABS: 650.0000 mg | ORAL_TABLET | ORAL | Status: DC | PRN

## 2013-01-07 MED ORDER — PANTOPRAZOLE SODIUM 40 MG PO TBEC
40.0000 mg | DELAYED_RELEASE_TABLET | Freq: Every day | ORAL | Status: DC
  Administered 2013-01-07: 40 mg via ORAL
  Filled 2013-01-07: qty 1

## 2013-01-07 MED ORDER — HYDROCODONE-ACETAMINOPHEN 5-325 MG PO TABS
1.0000 | ORAL_TABLET | Freq: Once | ORAL | Status: AC
  Administered 2013-01-07: 1 via ORAL
  Filled 2013-01-07: qty 1

## 2013-01-07 NOTE — ED Notes (Signed)
Awaiting IV team  

## 2013-01-07 NOTE — Progress Notes (Signed)
Patient ID: LULAR LETSON, female   DOB: 12-Nov-1950, 62 y.o.   MRN: [id number]   Patient Name: Patricia Cuevas Date of Encounter: 01/07/2013    SUBJECTIVE No further chest discomfort this morning. Her discomfort was a sharp stabbing pain left shoulder with tingling in the hand. It was unlike her previous angina. Enzymes negative. Had stress Myoview last year. Ambulating without discomfort. Admitting to lifting something that could have also muscle strain.  CURRENT MEDS . aspirin EC  81 mg Oral Daily  . lisinopril  20 mg Oral Daily   And  . hydrochlorothiazide  12.5 mg Oral Daily  . pantoprazole  40 mg Oral Daily  . simvastatin  20 mg Oral QHS    OBJECTIVE  Filed Vitals:   01/07/13 0115 01/07/13 0150 01/07/13 0500 01/07/13 0957  BP: 121/72 134/79 132/76 110/60  Pulse: 83 70 72   Temp:  98 F (36.7 C) 98.4 F (36.9 C)   TempSrc:      Resp:  16 18   Height:  4\' 11"  (1.499 m)    Weight:  212 lb 14.4 oz (96.571 kg)    SpO2: 98% 97% 97%    No intake or output data in the 24 hours ending 01/07/13 1050 Filed Weights   01/07/13 0150  Weight: 212 lb 14.4 oz (96.571 kg)    PHYSICAL EXAM  General: Pleasant, NAD. Neuro: Alert and oriented X 3. Moves all extremities spontaneously. Psych: Normal affect. HEENT:  Normal  Neck: Supple without bruits or JVD. Lungs:  Resp regular and unlabored, CTA. Heart: RRR no s3, s4, or murmurs. Abdomen: Soft, non-tender, non-distended, BS + x 4.  Extremities: No clubbing, cyanosis or edema. DP/PT/Radials 2+ and equal bilaterally.  Accessory Clinical Findings  CBC  Recent Labs  01/06/13 1956  WBC 7.0  HGB 14.7  HCT 42.9  MCV 83.8  PLT 207   Basic Metabolic Panel  Recent Labs  01/06/13 1956 01/07/13 0240  NA 141 139  K 3.9 3.5  CL 102 102  CO2 28 29  GLUCOSE 100* 167*  BUN 12 12  CREATININE 0.72 0.76  CALCIUM 10.2 9.7   Liver Function Tests No results found for this basename: AST, ALT, ALKPHOS, BILITOT, PROT, ALBUMIN,   in the last 72 hours No results found for this basename: LIPASE, AMYLASE,  in the last 72 hours Cardiac Enzymes  Recent Labs  01/07/13 0240 01/07/13 0840  TROPONINI <0.30 <0.30   BNP No components found with this basename: POCBNP,  D-Dimer  Recent Labs  01/06/13 2109  DDIMER <0.27   Hemoglobin A1C No results found for this basename: HGBA1C,  in the last 72 hours Fasting Lipid Panel No results found for this basename: CHOL, HDL, LDLCALC, TRIG, CHOLHDL, LDLDIRECT,  in the last 72 hours Thyroid Function Tests No results found for this basename: TSH, T4TOTAL, FREET3, T3FREE, THYROIDAB,  in the last 72 hours  TELE  Normal sinus rhythm  ECG    Radiology/Studies  Dg Chest 2 View  01/06/2013  *RADIOLOGY REPORT*  Clinical Data: Chest pain and shortness of breath.  CHEST - 2 VIEW  Comparison: 07/27/2012  Findings: Cardiomediastinal silhouette is stable with CABG changes. Elevation of the right hemidiaphragm is again noted. There is no evidence of focal airspace disease, pulmonary edema, suspicious pulmonary nodule/mass, pleural effusion, or pneumothorax. No acute bony abnormalities are identified. Clips within the right axilla are again identified.  IMPRESSION: No evidence of acute cardiopulmonary disease.   Original Report Authenticated By:  Harmon Pier, M.D.     ASSESSMENT AND PLAN   We'll discharge home today with sublingual nitroglycerin with instructions on acute coronary syndrome protocol. All questions answered. Schedule for followup with one of the physician assistants in the next 2 weeks. Would not schedule a stress Myoview at this time since symptoms atypical and had one recently.  Signed, Valera Castle MD

## 2013-01-07 NOTE — Discharge Summary (Signed)
Discharge Summary   Patient ID: Patricia Cuevas,  MRN: [id number], DOB/AGE: Jan 30, 1951 62 y.o.  Admit date: 01/06/2013 Discharge date: 01/07/2013  Primary Physician: VERTEFEUILLE,CYNTHIA K., NP Primary Cardiologist: Lovena Neighbours, MD  Discharge Diagnoses Principal Problem:   Atypical chest pain Active Problems:   Hx of CABG   CAD (coronary artery disease)   Breast cancer   Hypertension   Diabetes mellitus   Dyslipidemia   OSA (obstructive sleep apnea)   GERD (gastroesophageal reflux disease)   History of tobacco abuse  Allergies No Known Allergies  Diagnostic Studies/Procedures  PA/LATERAL CHEST X-RAY - 01/06/13  IMPRESSION:  No evidence of acute cardiopulmonary disease.  History of Present Illness  Patricia Cuevas is a 62 y.o. female who was admitted to [facility] on 01/06/13 with the above problem list.   She has a history of CAD status post CABG in 2009, hypertension, dyslipidemia, type 2 diabetes mellitus, breast cancer status post bilateral mastectomy, GERD, asthma, sleep apnea and history of tobacco abuse. She underwent a left scan Myoview in May of 2013 for chest discomfort. This demonstrated no evidence of ischemia, EF 66%.   She presented to [facility] ED on 01/06/13 complaining of variety of these symptoms including intermittent chest discomfort, tingling in her feet, dry mouth and an overall tired feeling. She described her chest discomfort is left-sided lasting a few minutes with unclear aggravating or alleviating factors. She denied any nitroglycerin at home.  They are, EKG revealed normal sinus rhythm with T wave inversions in V2 to V3 which were slightly more prominent from a November 2013 tracing. Initial troponin I and proBNP return within normal limits. BMP and CBC were within normal limits. Chest x-ray as above indicated no evidence of acute cardiopulmonary disease. D-dimer returned normal. UA revealed small amount of leukocytes and negative nitrites.  The patient was afebrile. Vital signs stable. Given her cardiac history, the decision was made to proceed with overnight rule out.  Hospital Course   She was started on SSI while inpatient. Oral hypoglycemics were held. She remained stable overnight with no episodes of chest pain. Two subsequent sets of troponin I returned within normal limits. She ambulated the following morning without discomfort. She was evaluated by Dr. Rogina Schiano this morning. On further description of her discomfort, she described this as a sharp stabbing left shoulder pain with tingling in her hand. She denied similarity to her prior angina. She did endorse recent overexertion by lifting a heavy object. Her discomfort was felt to be noncardiac, possibly musculoskeletal.  She was felt to be stable for discharge. The plan was made not to proceed with a stress Myoview given her atypical symptoms and recent noninvasive ischemic eval which was normal. She will be given a new prescription for nitroglycerin. She will resume her home medications outlined below. Of note, beta blocker will not be initiated given her history of asthma and concern of bronchospasm. This will be reevaluated on followup. She has been provided with supplementary information regarding acute coronary syndrome, including specific signs and symptoms to be aware of. She will followup in the office in approximately 2 weeks.   Discharge Vitals:  Blood pressure 110/60, pulse 72, temperature 98.4 F (36.9 C), temperature source Oral, resp. rate 18, height 4\' 11"  (1.499 m), weight 96.571 kg (212 lb 14.4 oz), SpO2 97.00%.   Labs: Recent Labs     01/06/13  1956  WBC  7.0  HGB  14.7  HCT  42.9  MCV  83.8  PLT  207   Recent Labs     01/06/13  2109  DDIMER  <0.27    Recent Labs Lab 01/06/13 1956 01/07/13 0240  NA 141 139  K 3.9 3.5  CL 102 102  CO2 28 29  BUN 12 12  CREATININE 0.72 0.76  CALCIUM 10.2 9.7  GLUCOSE 100* 167*   Recent Labs     01/07/13   0240  01/07/13  0840  TROPONINI  <0.30  <0.30   Disposition:  Discharge Orders   Future Orders Complete By Expires     Diet - low sodium heart healthy  As directed           Follow-up Information   Follow up with [facility]. (Office will call you with an appointment date and time. )    Contact information:   [address] [place] [address]       Discharge Medications:    Medication List    TAKE these medications       albuterol 108 (90 BASE) MCG/ACT inhaler  Commonly known as:  PROVENTIL HFA;VENTOLIN HFA  Inhale 2 puffs into the lungs every 6 (six) hours as needed. For shortness of breath     aspirin EC 81 MG tablet  Take 81 mg by mouth every other day.     glipiZIDE 10 MG tablet  Commonly known as:  GLUCOTROL  Take 10 mg by mouth daily.     lisinopril-hydrochlorothiazide 20-12.5 MG per tablet  Commonly known as:  PRINZIDE,ZESTORETIC  Take 1 tablet by mouth daily.     nitroGLYCERIN 0.4 MG SL tablet  Commonly known as:  NITROSTAT  Place 1 tablet (0.4 mg total) under the tongue every 5 (five) minutes x 3 doses as needed for chest pain.     RABEprazole 20 MG tablet  Commonly known as:  ACIPHEX  Take 20 mg by mouth daily.     simvastatin 20 MG tablet  Commonly known as:  ZOCOR  Take 20 mg by mouth at bedtime.     sucralfate 1 GM/10ML suspension  Commonly known as:  CARAFATE  Take 1 g by mouth every morning.       Outstanding Labs/Studies: None  Duration of Discharge Encounter: Greater than 30 minutes including physician time.  Signed, R. Hurman Horn, PA-C 01/07/2013, 1:52 PM    Jesse Sans. Daleen Squibb, MD, [facility] Pager:  [phone]

## 2013-01-07 NOTE — Progress Notes (Signed)
Utilization review completed.  P.J. Tadeo Besecker,RN,BSN Case Manager [phone]  

## 2013-01-07 NOTE — Plan of Care (Signed)
Problem: Phase I Progression Outcomes Goal: Aspirin unless contraindicated Outcome: Completed/Met Date Met:  01/07/13 Pt stated she had asprin before she came to the hospital

## 2013-01-07 NOTE — Progress Notes (Signed)
Pt had ambulated 300 feet per MD . Pt able to tolerate well. No complaints of discomfort, pain nor shortness of breath.  Thanks ! Ancil Linsey RN

## 2013-01-23 ENCOUNTER — Encounter: Payer: Medicaid Other | Admitting: Physician Assistant

## 2013-02-12 DIAGNOSIS — IMO0001 Reserved for inherently not codable concepts without codable children: Secondary | ICD-10-CM

## 2013-02-12 HISTORY — DX: Reserved for inherently not codable concepts without codable children: IMO0001

## 2013-02-20 ENCOUNTER — Telehealth: Payer: Self-pay | Admitting: Oncology

## 2013-02-20 NOTE — Telephone Encounter (Signed)
Faxed pt medical records to [facility].(per pt)

## 2013-03-01 ENCOUNTER — Encounter: Payer: Self-pay | Admitting: Physician Assistant

## 2013-03-01 ENCOUNTER — Ambulatory Visit (INDEPENDENT_AMBULATORY_CARE_PROVIDER_SITE_OTHER): Payer: Medicaid Other | Admitting: Physician Assistant

## 2013-03-01 VITALS — BP 118/58 | HR 71 | Ht 59.0 in | Wt 215.0 lb

## 2013-03-01 DIAGNOSIS — K219 Gastro-esophageal reflux disease without esophagitis: Secondary | ICD-10-CM

## 2013-03-01 DIAGNOSIS — I10 Essential (primary) hypertension: Secondary | ICD-10-CM

## 2013-03-01 DIAGNOSIS — E785 Hyperlipidemia, unspecified: Secondary | ICD-10-CM

## 2013-03-01 DIAGNOSIS — I2510 Atherosclerotic heart disease of native coronary artery without angina pectoris: Secondary | ICD-10-CM

## 2013-03-01 DIAGNOSIS — R079 Chest pain, unspecified: Secondary | ICD-10-CM

## 2013-03-01 MED ORDER — RABEPRAZOLE SODIUM 20 MG PO TBEC: 20.0000 mg | DELAYED_RELEASE_TABLET | Freq: Two times a day (BID) | ORAL | Status: DC

## 2013-03-01 NOTE — Progress Notes (Signed)
[address] [place], [place]  [address] Phone: [phone] Fax:  [phone]  Date:  03/01/2013   ID:  Patricia Cuevas, DOB Feb 20, 1951, MRN [id number]  PCP:  [name], MD [facility])  Cardiologist:  Dr. Zackery Barefoot     History of Present Illness: Patricia Cuevas is a 62 y.o. female who presents f/u after 2 recent admissions to the hospital.     She has a hx of CAD, s/p CABG 2009, HTN, HL, DM2, breast CA, s/p bilateral mastectomies, GERD, OSA, asthma, tobacco abuse.  LHC 07/2010:  L-LAD ok S-OM1/OM2 ok, S-PDA/PLA ok, S-Dx occluded.  Med Rx continued.  Echo 5/12:  Mild LVH, mild diastolic dysfn, EF 65%, mild LAE, RVSP 30-40.  Myoview 5/13: normal study, EF 66%.  Admitted 12/2012 with chest pain.  CEs remained neg.  No further workup was pursued.  She was just admitted to [facility] for chest pain 02/16/13.  CEs were neg.  CXR was not acute.  Patient was d/c with the understanding she would have a f/u stress test here.  She tells me this CP has been occurring for the last 2 mos.  She tells me the last episode was yesterday.  She describes it as "heartburn."  Pain will occur with certain types of meals.  Also notes CP with exertion.  She feels tired and weak with activity as well.  She can exert herself at times without CP.  She has taken NTG with relief.  She also notes relief with belching and antacids.  She does have some radiation to her left arm.  She notes diaphoresis.  No assoc nausea.  She has chronic DOE.  She is NYHA Class IIb-III.  No orthopnea, PND, edema.    Labs (4/14):  K 3.5, Cr 0.76, ALT 14, proBNP 21, Hgb 14.7  Labs (6/14):  K 3.6, Cr 0.68, DDimer < 0.27, Hgb 12.7, ALT 16 ([facility] labs)  Wt Readings from Last 3 Encounters:  03/01/13 215 lb (97.523 kg)  01/07/13 212 lb 14.4 oz (96.571 kg)  11/10/12 200 lb (90.719 kg)     Past Medical History  Diagnosis Date  . Hx of CABG     2007  . Hypertension   . Diabetes mellitus   . Ejection fraction    EF 65%, echo, [place], Jan 16, 2011  . Sleep apnea     CPAP being arranged May, 2011  . Dyslipidemia   . CAD (coronary artery disease)     Nuclear stress test Feb 11, 2011, EF 64%, no scar or ischemia    //         catheterization, November, 2011,patent LIMA-LAD-small caliber distal vessel with diffuse plaque, patent SVG to OM1 and OM 2, patent SVG to PDA and PLA, occluded SVG to diagonal, medical therapy  . Overweight(278.02)     stomach stapling 1985 followed by weight loss, then return of weight  . Tobacco abuse     in the past, resolved  . Alcohol abuse     in the past, resolved  . Nausea & vomiting     hospitalization, November, 2011, stricture GE junction, questionable stenosis gastrojejunostomy, disruptive primary peristaltic wave, GE reflux, delayed emptying proximal gastric pouch  . Hx of colonic polyps   . Asthma   . Myocardial infarction   . Breast cancer     Double mastectomy 2007; s/p tamoxifen and arimidex therapy; no recurrence since 05/2008    Current Outpatient Prescriptions  Medication  Sig Dispense Refill  . albuterol (PROVENTIL HFA;VENTOLIN HFA) 108 (90 BASE) MCG/ACT inhaler Inhale 2 puffs into the lungs every 6 (six) hours as needed. For shortness of breath      . aspirin EC 81 MG tablet Take 81 mg by mouth every other day.       [name] Kitchen glipiZIDE (GLUCOTROL) 10 MG tablet Take 10 mg by mouth daily.      [name] Kitchen lisinopril-hydrochlorothiazide (PRINZIDE,ZESTORETIC) 20-12.5 MG per tablet Take 1 tablet by mouth daily.        . nitroGLYCERIN (NITROSTAT) 0.4 MG SL tablet Place 1 tablet (0.4 mg total) under the tongue every 5 (five) minutes x 3 doses as needed for chest pain.  25 tablet  3  . RABEprazole (ACIPHEX) 20 MG tablet Take 20 mg by mouth daily.      . simvastatin (ZOCOR) 20 MG tablet Take 20 mg by mouth at bedtime.       . sucralfate (CARAFATE) 1 GM/10ML suspension Take 1 g by mouth every morning.        No current facility-administered medications for this visit.     Allergies:   No Known Allergies  Social History:  The patient  reports that she quit smoking about 12 years ago. Her smoking use included Cigarettes. She has a 81 pack-year smoking history. She has never used smokeless tobacco. She reports that she does not drink alcohol or use illicit drugs.   ROS:  Please see the history of present illness.     All other systems reviewed and negative.   PHYSICAL EXAM: VS:  BP 118/58  Pulse 71  Ht 4\' 11"  (1.499 m)  Wt 215 lb (97.523 kg)  BMI 43.4 kg/m2 Well nourished, well developed, in no acute distress HEENT: normal Neck: no JVD at 90 degrees Cardiac:  normal S1, S2; RRR; no murmur Lungs:  clear to auscultation bilaterally, no wheezing, rhonchi or rales Abd: soft, nontender, no hepatomegaly Ext: no edema Skin: warm and dry Neuro:  CNs 2-12 intact, no focal abnormalities noted  EKG:  NSR, HR 71, NSSTTW changes, no change from prior tracings     ASSESSMENT AND PLAN:  1. Chest Pain:  She has typical and atypical features.  Overall suspect GERD as a cause.  With hx of CAD and exertional CP that is NTG responsive, will arrange stress testing to rule out ischemia.  Schedule Lexiscan Myoview.  It should be noted, the patient arrived 1 hour late for her appt.  When told she needed to reschedule, she complained of chest pain and was added back to my schedule.  She told me her last episode of CP was yesterday and she was CP free in the office.   2. CAD:  Proceed with Lexiscan Myoview.  Continue ASA, statin. 3. Hypertension:  Controlled.  Continue current therapy.  4. Hyperlipidemia:  Continue statin. 5. GERD:  Increase Aciphex to 20 mg bid.  If stress test normal, consider referral to GI. 6. Disposition:  F/u with Dr. Zackery Barefoot in 4-6 weeks.   Signed, Tereso Newcomer, PA-C  03/01/2013 4:49 PM

## 2013-03-01 NOTE — Patient Instructions (Addendum)
SCHEDULE A LEXISCAN  FOLLOW UP WITH DR. KATZ IN ABOUT 4-6 WEEKS  INCREASE ACIPHEX 20 MG  TWICE DAILY

## 2013-03-03 ENCOUNTER — Emergency Department (HOSPITAL_COMMUNITY): Payer: Medicaid Other

## 2013-03-03 ENCOUNTER — Emergency Department (HOSPITAL_COMMUNITY)
Admission: EM | Admit: 2013-03-03 | Discharge: 2013-03-03 | Disposition: A | Discharge status: Home / Self Care | Payer: Medicaid Other | Attending: Emergency Medicine | Admitting: Emergency Medicine

## 2013-03-03 ENCOUNTER — Inpatient Hospital Stay (HOSPITAL_COMMUNITY)
Admission: EM | Admit: 2013-03-03 | Discharge: 2013-03-05 | DRG: 378 | Disposition: A | Discharge status: Home / Self Care | Payer: Medicaid Other | Attending: Internal Medicine | Admitting: Internal Medicine

## 2013-03-03 ENCOUNTER — Encounter (HOSPITAL_COMMUNITY): Payer: Self-pay | Admitting: *Deleted

## 2013-03-03 DIAGNOSIS — Z853 Personal history of malignant neoplasm of breast: Secondary | ICD-10-CM

## 2013-03-03 DIAGNOSIS — I2510 Atherosclerotic heart disease of native coronary artery without angina pectoris: Secondary | ICD-10-CM | POA: Diagnosis present

## 2013-03-03 DIAGNOSIS — I252 Old myocardial infarction: Secondary | ICD-10-CM | POA: Insufficient documentation

## 2013-03-03 DIAGNOSIS — E119 Type 2 diabetes mellitus without complications: Secondary | ICD-10-CM | POA: Insufficient documentation

## 2013-03-03 DIAGNOSIS — K219 Gastro-esophageal reflux disease without esophagitis: Secondary | ICD-10-CM | POA: Diagnosis present

## 2013-03-03 DIAGNOSIS — Z860100 Personal history of colon polyps, unspecified: Secondary | ICD-10-CM | POA: Insufficient documentation

## 2013-03-03 DIAGNOSIS — Z87891 Personal history of nicotine dependence: Secondary | ICD-10-CM | POA: Insufficient documentation

## 2013-03-03 DIAGNOSIS — C50919 Malignant neoplasm of unspecified site of unspecified female breast: Secondary | ICD-10-CM | POA: Diagnosis present

## 2013-03-03 DIAGNOSIS — Z6841 Body Mass Index (BMI) 40.0 and over, adult: Secondary | ICD-10-CM

## 2013-03-03 DIAGNOSIS — R112 Nausea with vomiting, unspecified: Secondary | ICD-10-CM | POA: Diagnosis present

## 2013-03-03 DIAGNOSIS — I10 Essential (primary) hypertension: Secondary | ICD-10-CM | POA: Diagnosis present

## 2013-03-03 DIAGNOSIS — Z951 Presence of aortocoronary bypass graft: Secondary | ICD-10-CM | POA: Insufficient documentation

## 2013-03-03 DIAGNOSIS — G4730 Sleep apnea, unspecified: Secondary | ICD-10-CM | POA: Insufficient documentation

## 2013-03-03 DIAGNOSIS — I25119 Atherosclerotic heart disease of native coronary artery with unspecified angina pectoris: Secondary | ICD-10-CM | POA: Diagnosis present

## 2013-03-03 DIAGNOSIS — Z79899 Other long term (current) drug therapy: Secondary | ICD-10-CM | POA: Insufficient documentation

## 2013-03-03 DIAGNOSIS — Z9861 Coronary angioplasty status: Secondary | ICD-10-CM

## 2013-03-03 DIAGNOSIS — E785 Hyperlipidemia, unspecified: Secondary | ICD-10-CM | POA: Diagnosis present

## 2013-03-03 DIAGNOSIS — J45909 Unspecified asthma, uncomplicated: Secondary | ICD-10-CM | POA: Insufficient documentation

## 2013-03-03 DIAGNOSIS — K920 Hematemesis: Secondary | ICD-10-CM

## 2013-03-03 DIAGNOSIS — Z8742 Personal history of other diseases of the female genital tract: Secondary | ICD-10-CM | POA: Insufficient documentation

## 2013-03-03 DIAGNOSIS — R109 Unspecified abdominal pain: Secondary | ICD-10-CM | POA: Insufficient documentation

## 2013-03-03 DIAGNOSIS — K922 Gastrointestinal hemorrhage, unspecified: Secondary | ICD-10-CM | POA: Diagnosis present

## 2013-03-03 DIAGNOSIS — Z7982 Long term (current) use of aspirin: Secondary | ICD-10-CM

## 2013-03-03 DIAGNOSIS — E663 Overweight: Secondary | ICD-10-CM | POA: Insufficient documentation

## 2013-03-03 LAB — COMPREHENSIVE METABOLIC PANEL
Albumin: 3.6 g/dL (ref 3.5–5.2)
Alkaline Phosphatase: 89 U/L (ref 39–117)
BUN: 12 mg/dL (ref 6–23)
CO2: 26 mEq/L (ref 19–32)
Chloride: 102 mEq/L (ref 96–112)
Creatinine, Ser: 0.7 mg/dL (ref 0.50–1.10)
GFR calc Af Amer: >90 mL/min (ref >90)
GFR calc non Af Amer: >90 mL/min (ref >90)
Glucose, Bld: 169 mg/dL — ABNORMAL HIGH (ref 70–99)
Sodium: 137 mEq/L (ref 135–145)
Total Bilirubin: 0.3 mg/dL (ref 0.3–1.2)

## 2013-03-03 LAB — CBC WITH DIFFERENTIAL/PLATELET
Basophils Absolute: 0 10*3/uL (ref 0.0–0.1)
Eosinophils Absolute: 0 10*3/uL (ref 0.0–0.7)
Eosinophils Relative: 1 % (ref 0–5)
Lymphocytes Relative: 56 % — ABNORMAL HIGH (ref 12–46)
MCH: 28.4 pg (ref 26.0–34.0)
MCV: 85.6 fL (ref 78.0–100.0)
Neutrophils Relative %: 37 % — ABNORMAL LOW (ref 43–77)
Platelets: 204 10*3/uL (ref 150–400)
RDW: 14.8 % (ref 11.5–15.5)
WBC: 7.9 10*3/uL (ref 4.0–10.5)

## 2013-03-03 LAB — OCCULT BLOOD GASTRIC / DUODENUM (SPECIMEN CUP)

## 2013-03-03 LAB — GLUCOSE, CAPILLARY: Glucose-Capillary: 165 mg/dL — ABNORMAL HIGH (ref 70–99)

## 2013-03-03 MED ORDER — SODIUM CHLORIDE 0.9 % IV SOLN
1000.0000 mL | INTRAVENOUS | Status: DC
  Administered 2013-03-04: 1000 mL via INTRAVENOUS

## 2013-03-03 MED ORDER — ONDANSETRON HCL 8 MG PO TABS: 8.0000 mg | ORAL_TABLET | Freq: Three times a day (TID) | ORAL | Status: DC | PRN

## 2013-03-03 MED ORDER — ONDANSETRON 4 MG PO TBDP
8.0000 mg | ORAL_TABLET | Freq: Once | ORAL | Status: AC
  Administered 2013-03-03: 8 mg via ORAL
  Filled 2013-03-03: qty 2

## 2013-03-03 MED ORDER — SODIUM CHLORIDE 0.9 % IV SOLN
80.0000 mg | Freq: Once | INTRAVENOUS | Status: AC
  Administered 2013-03-04: 80 mg via INTRAVENOUS
  Filled 2013-03-03: qty 80

## 2013-03-03 MED ORDER — LORAZEPAM 1 MG PO TABS
1.0000 mg | ORAL_TABLET | Freq: Once | ORAL | Status: AC
  Administered 2013-03-04: 1 mg via ORAL
  Filled 2013-03-03: qty 1

## 2013-03-03 MED ORDER — SODIUM CHLORIDE 0.9 % IV SOLN
1000.0000 mL | Freq: Once | INTRAVENOUS | Status: AC
  Administered 2013-03-04: 1000 mL via INTRAVENOUS

## 2013-03-03 MED ORDER — SODIUM CHLORIDE 0.9 % IV SOLN
8.0000 mg/h | INTRAVENOUS | Status: DC
  Administered 2013-03-04: 8 mg/h via INTRAVENOUS
  Filled 2013-03-03 (×2): qty 80

## 2013-03-03 MED ORDER — OXYCODONE-ACETAMINOPHEN 5-325 MG PO TABS
1.0000 | ORAL_TABLET | Freq: Once | ORAL | Status: DC
  Filled 2013-03-03: qty 1

## 2013-03-03 MED ORDER — HYDROMORPHONE HCL PF 1 MG/ML IJ SOLN
1.0000 mg | Freq: Once | INTRAMUSCULAR | Status: AC
  Administered 2013-03-04: 1 mg via INTRAVENOUS
  Filled 2013-03-03: qty 1

## 2013-03-03 MED ORDER — ONDANSETRON HCL 4 MG/2ML IJ SOLN
4.0000 mg | Freq: Once | INTRAMUSCULAR | Status: AC
  Administered 2013-03-04: 4 mg via INTRAVENOUS
  Filled 2013-03-03: qty 2

## 2013-03-03 NOTE — ED Notes (Signed)
Headache vomiting since yesterday.  She was seen and worked up last pm for the same

## 2013-03-03 NOTE — ED Notes (Signed)
Report from Jody, RN.

## 2013-03-03 NOTE — ED Provider Notes (Signed)
History     CSN: [id number]  Arrival date & time 03/03/13  0227   First MD Initiated Contact with Patient 03/03/13 [phone]      Chief Complaint  Patient presents with  . Abdominal Pain     Patient is a 62 y.o. female presenting with abdominal pain. The history is provided by the patient.  Abdominal Pain This is a new problem. Episode onset: just prior to arrival. The problem occurs constantly. The problem has not changed since onset.Associated symptoms include abdominal pain. Pertinent negatives include no chest pain and no shortness of breath. The symptoms are aggravated by swallowing. Nothing relieves the symptoms. She has tried rest for the symptoms.    Past Medical History  Diagnosis Date  . Hx of CABG     2007  . Hypertension   . Diabetes mellitus   . Ejection fraction     EF 65%, echo, [place], Jan 16, 2011  . Sleep apnea     CPAP being arranged May, 2011  . Dyslipidemia   . CAD (coronary artery disease)     Nuclear stress test Feb 11, 2011, EF 64%, no scar or ischemia    //         catheterization, November, 2011,patent LIMA-LAD-small caliber distal vessel with diffuse plaque, patent SVG to OM1 and OM 2, patent SVG to PDA and PLA, occluded SVG to diagonal, medical therapy  . Overweight(278.02)     stomach stapling 1985 followed by weight loss, then return of weight  . Tobacco abuse     in the past, resolved  . Alcohol abuse     in the past, resolved  . Nausea & vomiting     hospitalization, November, 2011, stricture GE junction, questionable stenosis gastrojejunostomy, disruptive primary peristaltic wave, GE reflux, delayed emptying proximal gastric pouch  . Hx of colonic polyps   . Asthma   . Myocardial infarction   . Breast cancer     Double mastectomy 2007; s/p tamoxifen and arimidex therapy; no recurrence since 05/2008    Past Surgical History  Procedure Laterality Date  . Coronary artery bypass graft    . Mastectomy    . Coronary stent placement    .  Abdominal hysterectomy      Family History  Problem Relation Age of Onset  . Hypertension Mother   . Heart disease Mother   . Diabetes Mother   . Heart disease Father   . Heart disease Sister   . Stomach cancer Sister   . Asthma Sister   . Colon cancer Father     History  Substance Use Topics  . Smoking status: Former Smoker -- 3.00 packs/day for 27 years    Types: Cigarettes    Quit date: 09/14/2000  . Smokeless tobacco: Never Used  . Alcohol Use: No     Comment: History of alcohol abuse but quit 5 years ago    OB History   Grav Para Term Preterm Abortions TAB SAB Ect Mult Living                  Review of Systems  Constitutional: Negative for fever.  Respiratory: Negative for shortness of breath.   Cardiovascular: Negative for chest pain.  Gastrointestinal: Positive for nausea, vomiting and abdominal pain. Negative for diarrhea.       Dark emesis   Neurological: Negative for weakness.  All other systems reviewed and are negative.    Allergies  Review of patient's allergies indicates no known  allergies.  Home Medications   Current Outpatient Rx  Name  Route  Sig  Dispense  Refill  . albuterol (PROVENTIL HFA;VENTOLIN HFA) 108 (90 BASE) MCG/ACT inhaler   Inhalation   Inhale 2 puffs into the lungs every 6 (six) hours as needed. For shortness of breath         . aspirin EC 81 MG tablet   Oral   Take 81 mg by mouth every other day.          . lisinopril-hydrochlorothiazide (PRINZIDE,ZESTORETIC) 20-12.5 MG per tablet   Oral   Take 1 tablet by mouth daily.           . metFORMIN (GLUCOPHAGE) 500 MG tablet   Oral   Take 500 mg by mouth daily with breakfast.         . nitroGLYCERIN (NITROSTAT) 0.4 MG SL tablet   Sublingual   Place 1 tablet (0.4 mg total) under the tongue every 5 (five) minutes x 3 doses as needed for chest pain.   25 tablet   3   . RABEprazole (ACIPHEX) 20 MG tablet   Oral   Take 1 tablet (20 mg total) by mouth 2 (two) times  daily.         . simvastatin (ZOCOR) 20 MG tablet   Oral   Take 20 mg by mouth at bedtime.          . sucralfate (CARAFATE) 1 GM/10ML suspension   Oral   Take 1 g by mouth every morning.            BP 158/83  Pulse 71  Temp(Src) 98.3 F (36.8 C) (Oral)  Resp 16  Ht 4\' 11"  (1.499 m)  Wt 215 lb (97.523 kg)  BMI 43.4 kg/m2  SpO2 96%  Physical Exam CONSTITUTIONAL: Well developed/well nourished HEAD: Normocephalic/atraumatic EYES: EOMI/PERRL ENMT: Mucous membranes moist NECK: supple no meningeal signs SPINE:entire spine nontender CV: S1/S2 noted, no murmurs/rubs/gallops noted LUNGS: Lungs are clear to auscultation bilaterally, no apparent distress ABDOMEN: soft, nontender, no rebound or guarding Rectal - stool color brownish without blood or melena.  Chaperone present GU:no cva tenderness NEURO: Pt is awake/alert, moves all extremitiesx4 EXTREMITIES: pulses normal, full ROM SKIN: warm, color normal PSYCH: no abnormalities of mood noted  ED Course  Procedures   Labs Reviewed  CBC WITH DIFFERENTIAL - Abnormal; Notable for the following:    Neutrophils Relative % 37 (*)    Lymphocytes Relative 56 (*)    Lymphs Abs 4.4 (*)    All other components within normal limits  COMPREHENSIVE METABOLIC PANEL - Abnormal; Notable for the following:    Glucose, Bld 169 (*)    All other components within normal limits  GLUCOSE, CAPILLARY - Abnormal; Notable for the following:    Glucose-Capillary 165 (*)    All other components within normal limits  PH, GASTRIC FLUID (GASTROCCULT CARD)  OCCULT BLOOD X 1 CARD TO LAB, STOOL  pt reports she took metformin for the first time last evening and then started to have abdominal pain and vomiting at home She is well appearing here PO challenge ordered   7:18 AM PT IMPROVED NO FURTHER VOMITING ABDOMEN SOFT WITHOUT FOCAL TENDERNESS SHE DENIES KNOWN H/O LIVER AND DENIES H/O KNOWN ULCER DISEASE SHE IS NOT ON ANTICOAGULANTS SHE IS ON  ACIPHEX WILL START ZOFRAN FOR HER NAUSEA I DOUBT SERIOUS GI BLEED (SHE HAS SMALL AMT OF VOMIT IN BAG THAT TEST GASTROCCULT POSITIVE, BUT NEGATIVE RECTAL EXAM) WILL REFER TO  GI SHE IS COMFORTABLE WITH D/C HOME MDM  Nursing notes including past medical history and social history reviewed and considered in documentation Labs/vital reviewed and considered        Date: 03/03/2013 0456am  Rate: 65  Rhythm: normal sinus rhythm  QRS Axis: normal  Intervals: normal  ST/T Wave abnormalities: nonspecific ST changes  Conduction Disutrbances:none     Joya Gaskins, MD 03/03/13 [phone]

## 2013-03-03 NOTE — ED Notes (Signed)
Two nurses attempted IV. IV team paged  

## 2013-03-03 NOTE — ED Notes (Signed)
Pt states that she took Metformin for the first time around 21:30. Pt states that she then woke up vomiting and was also having a dry mouth. Pt states addominal pain mainly stomach.

## 2013-03-04 ENCOUNTER — Encounter (HOSPITAL_COMMUNITY): Payer: Self-pay | Admitting: *Deleted

## 2013-03-04 DIAGNOSIS — E785 Hyperlipidemia, unspecified: Secondary | ICD-10-CM

## 2013-03-04 DIAGNOSIS — E119 Type 2 diabetes mellitus without complications: Secondary | ICD-10-CM

## 2013-03-04 DIAGNOSIS — I2510 Atherosclerotic heart disease of native coronary artery without angina pectoris: Secondary | ICD-10-CM

## 2013-03-04 DIAGNOSIS — K922 Gastrointestinal hemorrhage, unspecified: Principal | ICD-10-CM

## 2013-03-04 DIAGNOSIS — I10 Essential (primary) hypertension: Secondary | ICD-10-CM

## 2013-03-04 DIAGNOSIS — R112 Nausea with vomiting, unspecified: Secondary | ICD-10-CM

## 2013-03-04 LAB — URINALYSIS, ROUTINE W REFLEX MICROSCOPIC
Glucose, UA: NEGATIVE mg/dL
Hgb urine dipstick: NEGATIVE
Specific Gravity, Urine: 1.024 (ref 1.005–1.030)
pH: 6.5 (ref 5.0–8.0)

## 2013-03-04 LAB — HEMOGLOBIN AND HEMATOCRIT, BLOOD: Hemoglobin: 12.7 g/dL (ref 12.0–15.0)

## 2013-03-04 LAB — COMPREHENSIVE METABOLIC PANEL
ALT: 17 U/L (ref 0–35)
AST: 17 U/L (ref 0–37)
Albumin: 3.7 g/dL (ref 3.5–5.2)
Alkaline Phosphatase: 77 U/L (ref 39–117)
GFR calc Af Amer: >90 mL/min (ref >90)
Glucose, Bld: 148 mg/dL — ABNORMAL HIGH (ref 70–99)
Potassium: 4.1 mEq/L (ref 3.5–5.1)
Sodium: 140 mEq/L (ref 135–145)
Total Protein: 6.7 g/dL (ref 6.0–8.3)

## 2013-03-04 LAB — CBC WITH DIFFERENTIAL/PLATELET
Eosinophils Absolute: 0 10*3/uL (ref 0.0–0.7)
Lymphs Abs: 3.2 10*3/uL (ref 0.7–4.0)
MCH: 28.6 pg (ref 26.0–34.0)
Neutrophils Relative %: 53 % (ref 43–77)
Platelets: 203 10*3/uL (ref 150–400)
RBC: 4.72 MIL/uL (ref 3.87–5.11)
WBC: 8.2 10*3/uL (ref 4.0–10.5)

## 2013-03-04 LAB — CBC
MCV: 87 fL (ref 78.0–100.0)
Platelets: 189 10*3/uL (ref 150–400)
RDW: 15.2 % (ref 11.5–15.5)
WBC: 6.4 10*3/uL (ref 4.0–10.5)

## 2013-03-04 LAB — GLUCOSE, CAPILLARY
Glucose-Capillary: 147 mg/dL — ABNORMAL HIGH (ref 70–99)
Glucose-Capillary: 113 mg/dL — ABNORMAL HIGH (ref 70–99)
Glucose-Capillary: 106 mg/dL — ABNORMAL HIGH (ref 70–99)
Glucose-Capillary: 185 mg/dL — ABNORMAL HIGH (ref 70–99)

## 2013-03-04 LAB — TYPE AND SCREEN
ABO/RH(D): O POS
Antibody Screen: NEGATIVE

## 2013-03-04 LAB — LACTIC ACID, PLASMA: Lactic Acid, Venous: 1.3 mmol/L (ref 0.5–2.2)

## 2013-03-04 LAB — BASIC METABOLIC PANEL
CO2: 21 mEq/L (ref 19–32)
Calcium: 8.1 mg/dL — ABNORMAL LOW (ref 8.4–10.5)
Creatinine, Ser: 0.67 mg/dL (ref 0.50–1.10)
GFR calc Af Amer: >90 mL/min (ref >90)
GFR calc non Af Amer: >90 mL/min (ref >90)

## 2013-03-04 LAB — PROTIME-INR: INR: 1.18 (ref 0.00–1.49)

## 2013-03-04 LAB — APTT: aPTT: 29 seconds (ref 24–37)

## 2013-03-04 MED ORDER — PANTOPRAZOLE SODIUM 40 MG IV SOLR: 40.0000 mg | Freq: Two times a day (BID) | INTRAVENOUS | Status: DC

## 2013-03-04 MED ORDER — HYDROMORPHONE HCL PF 1 MG/ML IJ SOLN: 0.5000 mg | INTRAMUSCULAR | Status: DC | PRN

## 2013-03-04 MED ORDER — SODIUM CHLORIDE 0.9 % IV SOLN: 1000.0000 mL | INTRAVENOUS | Status: DC

## 2013-03-04 MED ORDER — PANTOPRAZOLE SODIUM 40 MG PO TBEC
40.0000 mg | DELAYED_RELEASE_TABLET | Freq: Every day | ORAL | Status: DC
  Administered 2013-03-04: 40 mg via ORAL
  Filled 2013-03-04: qty 1

## 2013-03-04 MED ORDER — ACETAMINOPHEN 325 MG PO TABS
650.0000 mg | ORAL_TABLET | Freq: Four times a day (QID) | ORAL | Status: DC | PRN
  Administered 2013-03-04 – 2013-03-05 (×2): 650 mg via ORAL
  Filled 2013-03-04 (×2): qty 2

## 2013-03-04 MED ORDER — ONDANSETRON HCL 4 MG/2ML IJ SOLN: 4.0000 mg | Freq: Four times a day (QID) | INTRAMUSCULAR | Status: DC | PRN

## 2013-03-04 MED ORDER — ACETAMINOPHEN 650 MG RE SUPP: 650.0000 mg | Freq: Four times a day (QID) | RECTAL | Status: DC | PRN

## 2013-03-04 MED ORDER — OXYCODONE HCL 5 MG PO TABS
5.0000 mg | ORAL_TABLET | ORAL | Status: DC | PRN
  Administered 2013-03-04: 5 mg via ORAL
  Filled 2013-03-04: qty 1

## 2013-03-04 MED ORDER — SODIUM CHLORIDE 0.9 % IV SOLN
80.0000 mg | Freq: Once | INTRAVENOUS | Status: DC
Start: 2013-03-04 — End: 2013-03-04

## 2013-03-04 MED ORDER — ZOLPIDEM TARTRATE 5 MG PO TABS: 5.0000 mg | ORAL_TABLET | Freq: Every evening | ORAL | Status: DC | PRN

## 2013-03-04 MED ORDER — SODIUM CHLORIDE 0.9 % IV SOLN: 8.0000 mg/h | INTRAVENOUS | Status: DC

## 2013-03-04 MED ORDER — SODIUM CHLORIDE 0.9 % IV SOLN
INTRAVENOUS | Status: DC
  Administered 2013-03-04 (×2): via INTRAVENOUS

## 2013-03-04 MED ORDER — ALUM & MAG HYDROXIDE-SIMETH 200-200-20 MG/5ML PO SUSP: 30.0000 mL | Freq: Four times a day (QID) | ORAL | Status: DC | PRN

## 2013-03-04 MED ORDER — PNEUMOCOCCAL VAC POLYVALENT 25 MCG/0.5ML IJ INJ
0.5000 mL | INJECTION | INTRAMUSCULAR | Status: AC
  Administered 2013-03-05: 0.5 mL via INTRAMUSCULAR
  Filled 2013-03-04: qty 0.5

## 2013-03-04 MED ORDER — ONDANSETRON HCL 4 MG PO TABS: 4.0000 mg | ORAL_TABLET | Freq: Four times a day (QID) | ORAL | Status: DC | PRN

## 2013-03-04 MED ORDER — SODIUM CHLORIDE 0.9 % IV SOLN
INTRAVENOUS | Status: AC
  Administered 2013-03-04 (×2): via INTRAVENOUS

## 2013-03-04 NOTE — H&P (Addendum)
[facility] History and Physical  KEERTHANA VANROSSUM ZOX:[id number] DOB: [birth year] DOA: 03/03/2013  Referring physician: EDP PCP: [name], MD  Specialists:   Chief Complaint: Vomiting Blood  HPI: MISCHELL Cuevas is a 62 y.o. female who presents to the ED with complaints of hematemesis of coffee ground emesis  Over the past 36 hours.  She was seen in the Ed 1 day ago for severe nausea and vomiting  And was medicated and found to have dark brown Gastroccult + emesis but  Did not have melena at that time.   She was discharged to home after receiving medcation for pain and was discharged to home on Carafate since she felt better.  She returned the next evening due to continue and worsening of her symptoms.   He hemoglobin level was found to be 13.5 , and she was medicated for her nausea and started on IV Protonix and an NGTube was placed.    She was referred for admission.       Review of Systems: The patient denies anorexia, fever, chills, headaches, weight loss, vision loss, diplopia, dizziness, decreased hearing, rhinitis, hoarseness, chest pain, syncope, dyspnea on exertion, peripheral edema, balance deficits, cough, hemoptysis, abdominal pain, diarrhea, constipation,  melena, hematochezia, severe indigestion/heartburn, dysuria, hematuria, incontinence, muscle weakness, suspicious skin lesions, transient blindness, difficulty walking, depression, unusual weight change, abnormal bleeding, enlarged lymph nodes, angioedema, and breast masses.    Past Medical History  Diagnosis Date  . Hx of CABG     2007  . Hypertension   . Diabetes mellitus   . Ejection fraction     EF 65%, echo, [place], Jan 16, 2011  . Sleep apnea     CPAP being arranged May, 2011  . Dyslipidemia   . CAD (coronary artery disease)     Nuclear stress test Feb 11, 2011, EF 64%, no scar or ischemia    //         catheterization, November, 2011,patent LIMA-LAD-small caliber distal vessel with diffuse plaque, patent  SVG to OM1 and OM 2, patent SVG to PDA and PLA, occluded SVG to diagonal, medical therapy  . Overweight(278.02)     stomach stapling 1985 followed by weight loss, then return of weight  . Tobacco abuse     in the past, resolved  . Alcohol abuse     in the past, resolved  . Nausea & vomiting     hospitalization, November, 2011, stricture GE junction, questionable stenosis gastrojejunostomy, disruptive primary peristaltic wave, GE reflux, delayed emptying proximal gastric pouch  . Hx of colonic polyps   . Asthma   . Myocardial infarction   . Breast cancer     Double mastectomy 2007; s/p tamoxifen and arimidex therapy; no recurrence since 05/2008    Past Surgical History  Procedure Laterality Date  . Coronary artery bypass graft    . Mastectomy    . Coronary stent placement    . Abdominal hysterectomy      Prior to Admission medications   Medication Sig Start Date End Date Taking? Authorizing Provider  albuterol (PROVENTIL HFA;VENTOLIN HFA) 108 (90 BASE) MCG/ACT inhaler Inhale 2 puffs into the lungs every 6 (six) hours as needed. For shortness of breath   Yes Historical Provider, MD  aspirin EC 81 MG tablet Take 81 mg by mouth every other day.    Yes Historical Provider, MD  lisinopril-hydrochlorothiazide (PRINZIDE,ZESTORETIC) 20-12.5 MG per tablet Take 1 tablet by mouth daily.     Yes Historical Provider,  MD  nitroGLYCERIN (NITROSTAT) 0.4 MG SL tablet Place 1 tablet (0.4 mg total) under the tongue every 5 (five) minutes x 3 doses as needed for chest pain. 01/07/13  Yes Roger A Arguello, PA-C  ondansetron (ZOFRAN) 8 MG tablet Take 1 tablet (8 mg total) by mouth every 8 (eight) hours as needed for nausea. 03/03/13  Yes Joya Gaskins, MD  RABEprazole (ACIPHEX) 20 MG tablet Take 1 tablet (20 mg total) by mouth 2 (two) times daily. 03/01/13  Yes Scott Moishe Spice, PA-C  simvastatin (ZOCOR) 20 MG tablet Take 20 mg by mouth at bedtime.    Yes Historical Provider, MD  sucralfate (CARAFATE) 1  GM/10ML suspension Take 1 g by mouth every morning.    Yes Historical Provider, MD    No Known Allergies  Social History:  reports that she quit smoking about 12 years ago. Her smoking use included Cigarettes. She has a 81 pack-year smoking history. She has never used smokeless tobacco. She reports that she does not drink alcohol or use illicit drugs.     Family History  Problem Relation Age of Onset  . Hypertension Mother   . Heart disease Mother   . Diabetes Mother   . Heart disease Father   . Heart disease Sister   . Stomach cancer Sister   . Asthma Sister   . Colon cancer Father      Physical Exam:  GEN:  Pleasant  62 y.o.Obese African American  female  examined  and in no acute distress; cooperative with exam Filed Vitals:   03/04/13 0101 03/04/13 0102 03/04/13 0104 03/04/13 0341  BP: 135/72 145/81 144/64 138/90  Pulse: 80 89 84 83  Temp:    98.7 F (37.1 C)  TempSrc:    Oral  Resp:    16  Height:    4\' 11"  (1.499 m)  Weight:    96.3 kg (212 lb 4.9 oz)  SpO2:    95%   Blood pressure 138/90, pulse 83, temperature 98.7 F (37.1 C), temperature source Oral, resp. rate 16, height 4\' 11"  (1.499 m), weight 96.3 kg (212 lb 4.9 oz), SpO2 95.00%. PSYCH: She is alert and oriented x4; does not appear anxious does not appear depressed; affect is normal HEENT: Normocephalic and Atraumatic, Mucous membranes pink; PERRLA; EOM intact; Fundi:  Benign;  No scleral icterus, Nares: Patent, Oropharynx: Clear, Fair Dentition, Neck:  FROM, no cervical lymphadenopathy nor thyromegaly or carotid bruit; no JVD; Breasts:: Not examined CHEST WALL: No tenderness CHEST: Normal respiration, clear to auscultation bilaterally HEART: Regular rate and rhythm; no murmurs rubs or gallops BACK: No kyphosis or scoliosis; no CVA tenderness ABDOMEN: Positive Bowel Sounds,  Obese, soft+ TENDERNESS in EPIGASTRIUM; no masses, no organomegaly, no pannus; no intertriginous candida. Rectal Exam: Not  done EXTREMITIES: No cyanosis, clubbing or edema; no ulcerations. Genitalia: not examined PULSES: 2+ and symmetric SKIN: Normal hydration no rash or ulceration CNS: Cranial nerves 2-12 grossly intact no focal neurologic deficit    Labs on Admission:  Basic Metabolic Panel:  Recent Labs Lab 03/03/13 0234 03/04/13 0020  NA 137 140  K 4.1 4.1  CL 102 103  CO2 26 29  GLUCOSE 169* 148*  BUN 12 12  CREATININE 0.70 0.79  CALCIUM 9.7 9.7   Liver Function Tests:  Recent Labs Lab 03/03/13 0234 03/04/13 0020  AST 19 17  ALT 18 17  ALKPHOS 89 77  BILITOT 0.3 0.5  PROT 6.6 6.7  ALBUMIN 3.6 3.7  Recent Labs Lab 03/04/13 0020  LIPASE 34   No results found for this basename: AMMONIA,  in the last 168 hours CBC:  Recent Labs Lab 03/03/13 0234 03/04/13 0020 03/04/13 0231  WBC 7.9 8.2  --   NEUTROABS 2.9 4.3  --   HGB 13.0 13.5 12.3  HCT 39.1 40.6 37.6  MCV 85.6 86.0  --   PLT 204 203  --    Cardiac Enzymes: No results found for this basename: CKTOTAL, CKMB, CKMBINDEX, TROPONINI,  in the last 168 hours  BNP (last 3 results)  Recent Labs  01/06/13 1956 01/06/13 2109  PROBNP 20.5 21.2   CBG:  Recent Labs Lab 03/03/13 0233 03/04/13 0430  GLUCAP 165* 147*    Radiological Exams on Admission: Dg Abd Acute W/chest  03/04/2013   *RADIOLOGY REPORT*  Clinical Data: Nausea and vomiting since yesterday, abdominal pain, history gastric bypass surgery, diabetes, hypertension, asthma, breast cancer, coronary artery disease post MI and CABG  ACUTE ABDOMEN SERIES (ABDOMEN 2 VIEW & CHEST 1 VIEW)  Comparison: Chest radiograph 01/06/2013, abdominal radiographs 05/21/2011  Findings: Chronic elevation right diaphragm. Upper normal heart size post median sternotomy. Tortuous aorta. Pulmonary vascularity normal. Chronic right basilar atelectasis. Lungs clear. No pleural effusion or pneumothorax. Surgical clips right axilla.  Diffuse osseous demineralization. Post gastric  surgery. Nonobstructive bowel gas pattern. No bowel dilatation or bowel wall thickening or free intraperitoneal air. Gas present in rectum. No definite urinary tract calcification or acute osseous findings.  IMPRESSION: No acute abnormalities.   Original Report Authenticated By: Ulyses Southward, M.D.        Assessment/Plan Principal Problem:   Upper GI bleed Active Problems:   Nausea & vomiting   Breast cancer   Hypertension   Diabetes mellitus   Dyslipidemia   CAD (coronary artery disease)    1.  Upper Gi Bleed-  IV Protonix Drip, and monitor H/Hs q 8 hours X 6, A Type and Screen has been sent, [place] [facility]  Needs to be consulted to see in AM 03/04/2013.    2.  Nausea and Vomiting- NGT placed in ED per EDP orders,  IV Anti-Emetics PRN.    3.  HTN-  Monitor BPs, IV hydralazine PRN.    3.  DM2-  Hold Oral  DM meds, SSI  coverage PRN.      Code Status:   FULL CODE Family Communication:   No Family Present  Disposition Plan:   Return to Home on discharge      Time spent: 71 Minutes  Ron Parker [facility] Pager [phone]  If 7PM-7AM, please contact night-coverage [email or web address] Password [facility] 03/04/2013, 6:04 AM

## 2013-03-04 NOTE — Progress Notes (Signed)
I have seen and assessed patient and agree with Dr Ernie Avena assessment and plan. Patient does have a history of BC powder use and presented with hemetemesis. Hgb currently stable at 12.3. Patient with minimal output from NGT. Continue IV PPI. Will d/c NGT. GI has been consulted and will see patient. Will place on ice chips.

## 2013-03-04 NOTE — ED Provider Notes (Signed)
History     CSN: [id number]  Arrival date & time 03/03/13  2312   First MD Initiated Contact with Patient 03/03/13 2321      Chief Complaint  Patient presents with  . Emesis    (Consider location/radiation/quality/duration/timing/severity/associated sxs/prior treatment) Patient is a 62 y.o. female presenting with vomiting. The history is provided by the patient and medical records. No language interpreter was used.  Emesis Severity:  Severe Duration:  2 days Timing:  Intermittent Number of daily episodes:  3-5 Emesis appearance: Coffee-ground emesis. Feeding tolerance: Nothing by mouth. Progression:  Worsening Chronicity:  New Recent urination:  Normal Context: not post-tussive and not self-induced   Relieved by:  Nothing Worsened by:  Liquids and ice chips Ineffective treatments:  Antiemetics Associated symptoms: abdominal pain and headaches   Associated symptoms: no arthralgias, no chills, no cough, no diarrhea, no fever, no myalgias, no sore throat and no URI   Risk factors: alcohol use (History of, no current use), diabetes and prior abdominal surgery   Risk factors: not pregnant now, no sick contacts and no suspect food intake     Patricia Cuevas is a 62 y.o. female  with a hx of gastric bypass 9-10 yrs ago, alcoholism at a younger age, CABG 2007, hypertension, diabetes, breast cancer with double mastectomy in 2007 presents to the Emergency Department complaining of gradual, persistent, progressively worsening coffee-ground emesis beginning yesterday. She was evaluated here in the emergency department yesterday with cessation of abdominal pain and vomiting. She was discharged home and went to sleep with return of the emesis today at noon. She states she has filled several large glasses with coffee ground emesis. She denies diarrhea or melena. Patient endorses sharp and burning epigastric pain that occasionally radiates into her chest only with vomiting. Patient also endorses  increasing fatigue. She denies consistent NSAID use.  She denies history of esophageal variceal bleed. Nothing makes symptoms better or worse.  Pt denies , chills, headache, neck pain, shortness of breath, diarrhea, dizziness, syncope, dysuria, hematuria.     Past Medical History  Diagnosis Date  . Hx of CABG     2007  . Hypertension   . Diabetes mellitus   . Ejection fraction     EF 65%, echo, [place], Jan 16, 2011  . Sleep apnea     CPAP being arranged May, 2011  . Dyslipidemia   . CAD (coronary artery disease)     Nuclear stress test Feb 11, 2011, EF 64%, no scar or ischemia    //         catheterization, November, 2011,patent LIMA-LAD-small caliber distal vessel with diffuse plaque, patent SVG to OM1 and OM 2, patent SVG to PDA and PLA, occluded SVG to diagonal, medical therapy  . Overweight(278.02)     stomach stapling 1985 followed by weight loss, then return of weight  . Tobacco abuse     in the past, resolved  . Alcohol abuse     in the past, resolved  . Nausea & vomiting     hospitalization, November, 2011, stricture GE junction, questionable stenosis gastrojejunostomy, disruptive primary peristaltic wave, GE reflux, delayed emptying proximal gastric pouch  . Hx of colonic polyps   . Asthma   . Myocardial infarction   . Breast cancer     Double mastectomy 2007; s/p tamoxifen and arimidex therapy; no recurrence since 05/2008    Past Surgical History  Procedure Laterality Date  . Coronary artery bypass graft    .  Mastectomy    . Coronary stent placement    . Abdominal hysterectomy      Family History  Problem Relation Age of Onset  . Hypertension Mother   . Heart disease Mother   . Diabetes Mother   . Heart disease Father   . Heart disease Sister   . Stomach cancer Sister   . Asthma Sister   . Colon cancer Father     History  Substance Use Topics  . Smoking status: Former Smoker -- 3.00 packs/day for 27 years    Types: Cigarettes    Quit date:  09/14/2000  . Smokeless tobacco: Never Used  . Alcohol Use: No     Comment: History of alcohol abuse but quit 5 years ago    OB History   Grav Para Term Preterm Abortions TAB SAB Ect Mult Living                  Review of Systems  Constitutional: Positive for fatigue. Negative for fever, chills, diaphoresis, appetite change and unexpected weight change.  HENT: Negative for sore throat, mouth sores and neck stiffness.   Eyes: Negative for visual disturbance.  Respiratory: Negative for cough, chest tightness, shortness of breath and wheezing.   Cardiovascular: Positive for chest pain (only with vomiting episodes).  Gastrointestinal: Positive for nausea, vomiting and abdominal pain. Negative for diarrhea and constipation.       Coffee-ground emesis  Endocrine: Negative for polydipsia, polyphagia and polyuria.  Genitourinary: Negative for dysuria, urgency, frequency and hematuria.  Musculoskeletal: Negative for myalgias, back pain and arthralgias.  Skin: Negative for rash.  Allergic/Immunologic: Negative for immunocompromised state.  Neurological: Positive for headaches. Negative for syncope and light-headedness.  Hematological: Does not bruise/bleed easily.  Psychiatric/Behavioral: Negative for sleep disturbance. The patient is not nervous/anxious.     Allergies  Review of patient's allergies indicates no known allergies.  Home Medications   Current Outpatient Rx  Name  Route  Sig  Dispense  Refill  . albuterol (PROVENTIL HFA;VENTOLIN HFA) 108 (90 BASE) MCG/ACT inhaler   Inhalation   Inhale 2 puffs into the lungs every 6 (six) hours as needed. For shortness of breath         . aspirin EC 81 MG tablet   Oral   Take 81 mg by mouth every other day.          . lisinopril-hydrochlorothiazide (PRINZIDE,ZESTORETIC) 20-12.5 MG per tablet   Oral   Take 1 tablet by mouth daily.           . nitroGLYCERIN (NITROSTAT) 0.4 MG SL tablet   Sublingual   Place 1 tablet (0.4 mg  total) under the tongue every 5 (five) minutes x 3 doses as needed for chest pain.   25 tablet   3   . ondansetron (ZOFRAN) 8 MG tablet   Oral   Take 1 tablet (8 mg total) by mouth every 8 (eight) hours as needed for nausea.   12 tablet   0   . RABEprazole (ACIPHEX) 20 MG tablet   Oral   Take 1 tablet (20 mg total) by mouth 2 (two) times daily.         . simvastatin (ZOCOR) 20 MG tablet   Oral   Take 20 mg by mouth at bedtime.          . sucralfate (CARAFATE) 1 GM/10ML suspension   Oral   Take 1 g by mouth every morning.  BP 146/71  Pulse 92  Temp(Src) 98.4 F (36.9 C) (Oral)  Resp 18  SpO2 94%  Physical Exam  Nursing note and vitals reviewed. Constitutional: She is oriented to person, place, and time. She appears well-developed and well-nourished.  HENT:  Head: Normocephalic and atraumatic.  Mouth/Throat: Oropharynx is clear and moist.  Eyes: Conjunctivae are normal. Pupils are equal, round, and reactive to light. No scleral icterus.  Neck: Normal range of motion.  Cardiovascular: Normal rate, regular rhythm, normal heart sounds and intact distal pulses.   No murmur heard. Pulmonary/Chest: Effort normal and breath sounds normal. No respiratory distress. She has no wheezes. She exhibits no tenderness.  Well-healed mastectomy scar  Abdominal: Soft. Bowel sounds are normal. She exhibits no distension and no mass. There is tenderness in the right upper quadrant, epigastric area and left upper quadrant. There is guarding. There is no rigidity, no rebound and no CVA tenderness.    Obese Significant tenderness in the epigastrium Patient with coffee ground emesis at bedside  Musculoskeletal: Normal range of motion. She exhibits no tenderness.  Lymphadenopathy:    She has no cervical adenopathy.  Neurological: She is alert and oriented to person, place, and time. She exhibits normal muscle tone. Coordination normal.  Skin: Skin is warm and dry. No rash  noted. No erythema.  Psychiatric: She has a normal mood and affect.    ED Course  Procedures (including critical care time)  Labs Reviewed  CBC WITH DIFFERENTIAL  PROTIME-INR  APTT  COMPREHENSIVE METABOLIC PANEL  LIPASE, BLOOD  URINALYSIS, ROUTINE W REFLEX MICROSCOPIC  LACTIC ACID, PLASMA  TYPE AND SCREEN   Dg Abd Acute W/chest  03/04/2013   *RADIOLOGY REPORT*  Clinical Data: Nausea and vomiting since yesterday, abdominal pain, history gastric bypass surgery, diabetes, hypertension, asthma, breast cancer, coronary artery disease post MI and CABG  ACUTE ABDOMEN SERIES (ABDOMEN 2 VIEW & CHEST 1 VIEW)  Comparison: Chest radiograph 01/06/2013, abdominal radiographs 05/21/2011  Findings: Chronic elevation right diaphragm. Upper normal heart size post median sternotomy. Tortuous aorta. Pulmonary vascularity normal. Chronic right basilar atelectasis. Lungs clear. No pleural effusion or pneumothorax. Surgical clips right axilla.  Diffuse osseous demineralization. Post gastric surgery. Nonobstructive bowel gas pattern. No bowel dilatation or bowel wall thickening or free intraperitoneal air. Gas present in rectum. No definite urinary tract calcification or acute osseous findings.  IMPRESSION: No acute abnormalities.   Original Report Authenticated By: Ulyses Southward, M.D.     1. Upper GI bleed   2. GERD (gastroesophageal reflux disease)   3. CAD (coronary artery disease)   4. Hx of CABG   5. Nausea & vomiting   6. Diabetes mellitus       MDM  Patricia Cuevas presents with history and physical consistent with upper GI bleed. Patient with history of gastric bypass 9 or 10 years ago he had increased risk for peptic ulcer disease. Patient also with result history of alcoholism. She denies history of esophageal variceal bleed and emesis at bedside is distinctly coffee ground.  Patient hemodynamically stable, no hypertension or tachycardia, patient afebrile. Nonseptic, nontoxic appearing.    12:25  AM Acute abdominal series without free air noted in the abdomen, no pneumomediastinum noted.  I personally reviewed the imaging tests through PACS system.  I reviewed available ER/hospitalization records through the EMR.     12:52 AM Pt without anemia, PT/INR and aPTT WNL.  CMP, Lipase, lactic acid and type and screen pending.  Discussed with Kyung Bacca, PA-C who will  follow-labs and proceed with admission.    Dahlia Client Dorin Stooksbury, PA-C 03/04/13 [phone]

## 2013-03-04 NOTE — ED Notes (Signed)
MD at bedside. 

## 2013-03-04 NOTE — Progress Notes (Signed)
Patient arrived to unit from ED via stretcher. Patient alert, oriented and able to ambulate to bed with minimal assistance. Admission weight and vitals completed. Fall and safety plan reviewed with patient.Bed alarm in use, call light within reach, patient currently resting comfortably. Will continue to monitor. Blood pressure 138/90, pulse 83, temperature 98.7 F (37.1 C), temperature source Oral, resp. rate 16, height 4\' 11"  (1.499 m), weight 96.3 kg (212 lb 4.9 oz), SpO2 95.00%. Troy Sine

## 2013-03-04 NOTE — ED Notes (Signed)
IV team at bedside 

## 2013-03-04 NOTE — ED Provider Notes (Signed)
Medical screening examination/treatment/procedure(s) were conducted as a shared visit with non-physician practitioner(s) and myself.  I personally evaluated the patient during the encounter  Brandt Loosen, MD 03/04/13 [phone]

## 2013-03-04 NOTE — Consult Note (Signed)
Cross cover [facility] Reason for Consult: Coffee ground emesis with a hemoglobin of 13.5 gm/dl Referring Physician: [facility]-Dr. Della Goo.  Patricia Cuevas is an 62 y.o. female.  HPI: 62 year old, morbidly obese, black female, with a longstanding history of joint pains, admits to using several packs of BC's everyday. She was evaluated in the ER, the night prior to her admission and sent home after she received IV fluids and PPI's. She presented to the ER again with worsening symptoms and CGE. She denies having any melena or hematochezia. She has a good appetite  And her weight has been stable. She denies having any dysphagia or odynophagia. She has been hemodynamically stable since admission with a slight drop in her hemoglobin to 12.3 gm/dl after hydration. She has an NG tube placed briefly; this had been removed before I saw her. She denies any GI issues at the present time. She takes PPI's off and on for reflux. She has had adenomatous polyps removed 2 years ago.   Past Medical History  Diagnosis Date  . Hx of CABG     2007  . Hypertension   . Diabetes mellitus   . Ejection fraction     EF 65%, echo, [place], Jan 16, 2011  . Sleep apnea     CPAP being arranged May, 2011  . Dyslipidemia   . CAD (coronary artery disease)     Nuclear stress test Feb 11, 2011, EF 64%, no scar or ischemia    //         catheterization, November, 2011,patent LIMA-LAD-small caliber distal vessel with diffuse plaque, patent SVG to OM1 and OM 2, patent SVG to PDA and PLA, occluded SVG to diagonal, medical therapy  . Overweight(278.02)     stomach stapling 1985 followed by weight loss, then return of weight  . Tobacco abuse     in the past, resolved  . Alcohol abuse     in the past, resolved  . Nausea & vomiting     hospitalization, November, 2011, stricture GE junction, questionable stenosis gastrojejunostomy, disruptive primary peristaltic wave, GE reflux, delayed emptying proximal gastric pouch  . Hx of  colonic polyps   . Asthma   . Myocardial infarction   . Breast cancer     Double mastectomy 2007; s/p tamoxifen and arimidex therapy; no recurrence since 05/2008   Past Surgical History  Procedure Laterality Date  . Coronary artery bypass graft    . Mastectomy    . Coronary stent placement    . Abdominal hysterectomy     Family History  Problem Relation Age of Onset  . Hypertension Mother   . Heart disease Mother   . Diabetes Mother   . Heart disease Father   . Heart disease Sister   . Stomach cancer Sister   . Asthma Sister   . Colon cancer Father     Social History:  reports that she quit smoking about 12 years ago. Her smoking use included Cigarettes. She has a 81 pack-year smoking history. She has never used smokeless tobacco. She reports that she does not drink alcohol or use illicit drugs.  Allergies: No Known Allergies  Medications: I have reviewed the patient's current medications.  Results for orders placed during the hospital encounter of 03/03/13 (from the past 48 hour(s))  CBC WITH DIFFERENTIAL     Status: None   Collection Time    03/04/13 12:20 AM      Result Value Range   WBC 8.2  4.0 - 10.5 K/uL   RBC 4.72  3.87 - 5.11 MIL/uL   Hemoglobin 13.5  12.0 - 15.0 g/dL   HCT 16.1  09.6 - 04.5 %   MCV 86.0  78.0 - 100.0 fL   MCH 28.6  26.0 - 34.0 pg   MCHC 33.3  30.0 - 36.0 g/dL   RDW 40.9  81.1 - 91.4 %   Platelets 203  150 - 400 K/uL   Neutrophils Relative % 53  43 - 77 %   Neutro Abs 4.3  1.7 - 7.7 K/uL   Lymphocytes Relative 39  12 - 46 %   Lymphs Abs 3.2  0.7 - 4.0 K/uL   Monocytes Relative 8  3 - 12 %   Monocytes Absolute 0.7  0.1 - 1.0 K/uL   Eosinophils Relative 0  0 - 5 %   Eosinophils Absolute 0.0  0.0 - 0.7 K/uL   Basophils Relative 0  0 - 1 %   Basophils Absolute 0.0  0.0 - 0.1 K/uL  COMPREHENSIVE METABOLIC PANEL     Status: Abnormal   Collection Time    03/04/13 12:20 AM      Result Value Range   Sodium 140  135 - 145 mEq/L   Potassium  4.1  3.5 - 5.1 mEq/L   Chloride 103  96 - 112 mEq/L   CO2 29  19 - 32 mEq/L   Glucose, Bld 148 (*) 70 - 99 mg/dL   BUN 12  6 - 23 mg/dL   Creatinine, Ser 7.82  0.50 - 1.10 mg/dL   Calcium 9.7  8.4 - 95.6 mg/dL   Total Protein 6.7  6.0 - 8.3 g/dL   Albumin 3.7  3.5 - 5.2 g/dL   AST 17  0 - 37 U/L   ALT 17  0 - 35 U/L   Alkaline Phosphatase 77  39 - 117 U/L   Total Bilirubin 0.5  0.3 - 1.2 mg/dL   GFR calc non Af Amer 87 (*) >90 mL/min   GFR calc Af Amer >90  >90 mL/min   Comment:            The eGFR has been calculated     using the CKD EPI equation.     This calculation has not been     validated in all clinical     situations.     eGFR's persistently     <90 mL/min signify     possible Chronic Kidney Disease.  LIPASE, BLOOD     Status: None   Collection Time    03/04/13 12:20 AM      Result Value Range   Lipase 34  11 - 59 U/L  PROTIME-INR     Status: None   Collection Time    03/04/13 12:20 AM      Result Value Range   Prothrombin Time 14.8  11.6 - 15.2 seconds   INR 1.18  0.00 - 1.49  APTT     Status: None   Collection Time    03/04/13 12:20 AM      Result Value Range   aPTT 29  24 - 37 seconds  LACTIC ACID, PLASMA     Status: None   Collection Time    03/04/13 12:20 AM      Result Value Range   Lactic Acid, Venous 1.3  0.5 - 2.2 mmol/L  TYPE AND SCREEN     Status: None   Collection Time  03/04/13 12:20 AM      Result Value Range   ABO/RH(D) O POS     Antibody Screen NEG     Sample Expiration 03/07/2013    URINALYSIS, ROUTINE W REFLEX MICROSCOPIC     Status: Abnormal   Collection Time    03/04/13  1:14 AM      Result Value Range   Color, Urine YELLOW  YELLOW   APPearance CLOUDY (*) CLEAR   Specific Gravity, Urine 1.024  1.005 - 1.030   pH 6.5  5.0 - 8.0   Glucose, UA NEGATIVE  NEGATIVE mg/dL   Hgb urine dipstick NEGATIVE  NEGATIVE   Bilirubin Urine SMALL (*) NEGATIVE   Ketones, ur 15 (*) NEGATIVE mg/dL   Protein, ur NEGATIVE  NEGATIVE mg/dL    Urobilinogen, UA 4.0 (*) 0.0 - 1.0 mg/dL   Nitrite NEGATIVE  NEGATIVE   Leukocytes, UA SMALL (*) NEGATIVE  URINE MICROSCOPIC-ADD ON     Status: Abnormal   Collection Time    03/04/13  1:14 AM      Result Value Range   Squamous Epithelial / LPF RARE  RARE   WBC, UA 7-10  <3 WBC/hpf   RBC / HPF 0-2  <3 RBC/hpf   Bacteria, UA MANY (*) RARE   Urine-Other MUCOUS PRESENT    HEMOGLOBIN AND HEMATOCRIT, BLOOD     Status: None   Collection Time    03/04/13  2:31 AM      Result Value Range   Hemoglobin 12.3  12.0 - 15.0 g/dL   HCT 16.1  09.6 - 04.5 %  GLUCOSE, CAPILLARY     Status: Abnormal   Collection Time    03/04/13  4:30 AM      Result Value Range   Glucose-Capillary 147 (*) 70 - 99 mg/dL  BASIC METABOLIC PANEL     Status: Abnormal   Collection Time    03/04/13  6:25 AM      Result Value Range   Sodium 138  135 - 145 mEq/L   Potassium 4.0  3.5 - 5.1 mEq/L   Chloride 106  96 - 112 mEq/L   CO2 21  19 - 32 mEq/L   Glucose, Bld 160 (*) 70 - 99 mg/dL   BUN 9  6 - 23 mg/dL   Creatinine, Ser 4.09  0.50 - 1.10 mg/dL   Calcium 8.1 (*) 8.4 - 10.5 mg/dL   GFR calc non Af Amer >90  >90 mL/min   GFR calc Af Amer >90  >90 mL/min   Comment:            The eGFR has been calculated     using the CKD EPI equation.     This calculation has not been     validated in all clinical     situations.     eGFR's persistently     <90 mL/min signify     possible Chronic Kidney Disease.  CBC     Status: None   Collection Time    03/04/13  6:25 AM      Result Value Range   WBC 6.4  4.0 - 10.5 K/uL   RBC 4.37  3.87 - 5.11 MIL/uL   Hemoglobin 12.3  12.0 - 15.0 g/dL   HCT 81.1  91.4 - 78.2 %   MCV 87.0  78.0 - 100.0 fL   MCH 28.1  26.0 - 34.0 pg   MCHC 32.4  30.0 - 36.0 g/dL   RDW 95.6  11.5 - 15.5 %   Platelets 189  150 - 400 K/uL  GLUCOSE, CAPILLARY     Status: Abnormal   Collection Time    03/04/13  8:09 AM      Result Value Range   Glucose-Capillary 141 (*) 70 - 99 mg/dL   Comment 1  Notify RN     Comment 2 Documented in Chart    HEMOGLOBIN AND HEMATOCRIT, BLOOD     Status: None   Collection Time    03/04/13 11:00 AM      Result Value Range   Hemoglobin 12.7  12.0 - 15.0 g/dL   HCT 81.1  91.4 - 78.2 %  GLUCOSE, CAPILLARY     Status: Abnormal   Collection Time    03/04/13 11:22 AM      Result Value Range   Glucose-Capillary 113 (*) 70 - 99 mg/dL   Comment 1 Notify RN     Comment 2 Documented in Chart    GLUCOSE, CAPILLARY     Status: Abnormal   Collection Time    03/04/13  4:18 PM      Result Value Range   Glucose-Capillary 106 (*) 70 - 99 mg/dL   Comment 1 Notify RN     Comment 2 Documented in Chart      Dg Abd Acute W/chest  03/04/2013   *RADIOLOGY REPORT*  Clinical Data: Nausea and vomiting since yesterday, abdominal pain, history gastric bypass surgery, diabetes, hypertension, asthma, breast cancer, coronary artery disease post MI and CABG  ACUTE ABDOMEN SERIES (ABDOMEN 2 VIEW & CHEST 1 VIEW)  Comparison: Chest radiograph 01/06/2013, abdominal radiographs 05/21/2011  Findings: Chronic elevation right diaphragm. Upper normal heart size post median sternotomy. Tortuous aorta. Pulmonary vascularity normal. Chronic right basilar atelectasis. Lungs clear. No pleural effusion or pneumothorax. Surgical clips right axilla.  Diffuse osseous demineralization. Post gastric surgery. Nonobstructive bowel gas pattern. No bowel dilatation or bowel wall thickening or free intraperitoneal air. Gas present in rectum. No definite urinary tract calcification or acute osseous findings.  IMPRESSION: No acute abnormalities.   Original Report Authenticated By: Ulyses Southward, M.D.   Review of Systems  Constitutional: Negative.   HENT: Negative.   Eyes: Negative.   Respiratory: Negative.   Cardiovascular: Negative.   Gastrointestinal: Positive for heartburn.  Genitourinary: Negative.   Musculoskeletal: Positive for myalgias, back pain and joint pain.  Skin: Negative.    Endo/Heme/Allergies: Negative.   Psychiatric/Behavioral: Negative.    Blood pressure 143/76, pulse 94, temperature 98.7 F (37.1 C), temperature source Oral, resp. rate 18, height 4\' 11"  (1.499 m), weight 96.3 kg (212 lb 4.9 oz), SpO2 98.00%. Physical Exam  Constitutional: She is oriented to person, place, and time. She appears well-developed and well-nourished.  Morbidly obese  HENT:  Head: Normocephalic and atraumatic.  Eyes: Conjunctivae and EOM are normal. Pupils are equal, round, and reactive to light.  Neck: Normal range of motion. Neck supple.  Cardiovascular: Normal rate, regular rhythm and normal heart sounds.   Respiratory: Effort normal and breath sounds normal.  GI: Soft. Bowel sounds are normal. She exhibits no distension and no mass. There is no tenderness. There is no rebound and no guarding.  Musculoskeletal: Normal range of motion.  Neurological: She is alert and oriented to person, place, and time.  Skin: Skin is warm and dry.  Psychiatric: She has a normal mood and affect. Her behavior is normal. Judgment and thought content normal.   Assessment/Plan: 1) CGE ? Reflux esophagitis/GERD: Continue PPI's and have  patient follow up with Dr. Gerilyn Pilgrim in 1 week. She has been strongly counseled against the use of BC powders and other related NSAIDS. I do not feel she needs an EGD at this time. She has also been advised to call her PCP at the [facility] in [facility] and see them for management of her arthritis.  2) HTN/CAD/S.P CABG. 3) Diabetes mellitus.  4) Morbid obesity/OSA/S/P gastric stapling. 5) Dyslipidemia. 6) History of breast cancer. Please call if further assistance is needed.  Kenyia Wambolt 03/04/2013, 4:46 PM

## 2013-03-05 DIAGNOSIS — K920 Hematemesis: Secondary | ICD-10-CM

## 2013-03-05 LAB — CBC
Hemoglobin: 12.4 g/dL (ref 12.0–15.0)
MCH: 28.1 pg (ref 26.0–34.0)
MCHC: 32 g/dL (ref 30.0–36.0)
MCV: 87.8 fL (ref 78.0–100.0)
RBC: 4.42 MIL/uL (ref 3.87–5.11)

## 2013-03-05 LAB — BASIC METABOLIC PANEL
BUN: 5 mg/dL — ABNORMAL LOW (ref 6–23)
CO2: 23 mEq/L (ref 19–32)
GFR calc non Af Amer: >90 mL/min (ref >90)
Glucose, Bld: 173 mg/dL — ABNORMAL HIGH (ref 70–99)
Potassium: 3.7 mEq/L (ref 3.5–5.1)
Sodium: 138 mEq/L (ref 135–145)

## 2013-03-05 LAB — GLUCOSE, CAPILLARY: Glucose-Capillary: 205 mg/dL — ABNORMAL HIGH (ref 70–99)

## 2013-03-05 MED ORDER — RABEPRAZOLE SODIUM 20 MG PO TBEC: 20.0000 mg | DELAYED_RELEASE_TABLET | Freq: Two times a day (BID) | ORAL | Status: DC

## 2013-03-05 MED ORDER — ACETAMINOPHEN 325 MG PO TABS: 650.0000 mg | ORAL_TABLET | Freq: Four times a day (QID) | ORAL | Status: DC | PRN

## 2013-03-05 NOTE — Progress Notes (Signed)
Pt discharge instructions and education completed. IV site d/c. Site WNL. Prescriptions sent with patient. No further questions or concerns. D/C home with husband. Dion Saucier

## 2013-03-05 NOTE — Discharge Summary (Signed)
Physician Discharge Summary  Patricia Cuevas:[id number] DOB: [birth year] DOA: 03/03/2013  PCP: Patricia Damme, MD  Admit date: 03/03/2013 Discharge date: 03/05/2013  Time spent: 65 minutes  Recommendations for Outpatient Follow-up:  1. Follow up with Patricia Damme, MD in 1 week. 2. Follow up with [name] in 1 week. Repeat CBC to f/u on coffee ground emesis.;  Discharge Diagnoses:  Principal Problem:   Upper GI bleed Active Problems:   Breast cancer   Hypertension   Diabetes mellitus   Dyslipidemia   Nausea & vomiting   CAD (coronary artery disease)   GERD (gastroesophageal reflux disease)   Discharge Condition: Stable  Diet recommendation: Carb modified diet  Filed Weights   03/04/13 0341  Weight: 96.3 kg (212 lb 4.9 oz)    History of present illness:  Patricia Cuevas is a 62 y.o. female who presents to the ED with complaints of hematemesis of coffee ground emesis Over the past 36 hours. She was seen in the Ed 1 day ago for severe nausea and vomiting And was medicated and found to have dark brown Gastroccult + emesis but Did not have melena at that time. She was discharged to home after receiving medcation for pain and was discharged to home on Carafate since she felt better. She returned the next evening due to continue and worsening of her symptoms. He hemoglobin level was found to be 13.5 , and she was medicated for her nausea and started on IV Protonix and an NGTube was placed. She was referred for admission.      Hospital Course:  #1 coffee-ground emesis/hematemesis/probable upper GI bleed Patient was admitted with complaints of coffee ground emesis the past 36 hours prior to admission. Patient had presented to the ED one day prior to that and discharged home on Carafate. Patient was monitored an NG tube was initially placed in the hemoglobin was followed. Patient's hemoglobin remained stable at 12.3. Patient was placed on a telemetry monitored. Patient was also placed  on a proton pump inhibitor. During the hospitalization patient had minimal output from the NG tube which was subsequently discontinued. Patient was subsequently started on clear liquids and a GI consultation was obtained. Patient was seen in consultation by Dr. Loreta Cuevas who felt patient's symptoms would likely be related to reflux esophagitis versus GERD in the setting of BC powder use.. It was felt that patient did not need an EGD at this time. Patient was counseled against the use of BC powders and other NSAIDs. Patient diet was advanced she remained stable hemoglobin remained stable and did not have any further episodes. Patient will be discharged home in stable and improved condition to followup with Dr. Christella Hartigan of [name] and PCP one week post discharge.  #2 diabetes mellitus Remained stable throughout the hospitalization.  The rest of patient's chronic medical issues remained stable throughout the hospitalization the patient be discharged in stable and improved condition.  Procedures:  CXR 03/03/13  Consultations:  GI: Dr Patricia Cuevas  Discharge Exam: Filed Vitals:   03/04/13 0341 03/04/13 1356 03/04/13 2101 03/05/13 0437  BP: 138/90 143/76 148/86 115/87  Pulse: 83 94 104 80  Temp: 98.7 F (37.1 C) 98.7 F (37.1 C) 98.9 F (37.2 C) 98.5 F (36.9 C)  TempSrc: Oral Oral Oral Oral  Resp: 16 18 18 18   Height: 4\' 11"  (1.499 m)     Weight: 96.3 kg (212 lb 4.9 oz)     SpO2: 95% 98% 93% 98%    General: NAD  Cardiovascular: RRR Respiratory: CTAB  Discharge Instructions      Discharge Orders   Future Appointments Provider Department Dept Phone   03/09/2013 9:00 AM [facility] [place]) [facility] SITE 3 NUCLEAR MED [phone]   04/12/2013 3:45 PM Luis Abed, MD [place] [facility] Main Office [place]) [phone]   Future Orders Complete By Expires     Diet Carb Modified  As directed     Discharge instructions  As directed     Comments:      Follow up  with Patricia Damme, MD in 1 week. Follow up with Dr Arlyce Dice in 1 week. NO MOE BC POWDER, NO NSAIDS.    Increase activity slowly  As directed         Medication List    TAKE these medications       acetaminophen 325 MG tablet  Commonly known as:  TYLENOL  Take 2 tablets (650 mg total) by mouth every 6 (six) hours as needed for pain.     albuterol 108 (90 BASE) MCG/ACT inhaler  Commonly known as:  PROVENTIL HFA;VENTOLIN HFA  Inhale 2 puffs into the lungs every 6 (six) hours as needed. For shortness of breath     aspirin EC 81 MG tablet  Take 81 mg by mouth every other day.     lisinopril-hydrochlorothiazide 20-12.5 MG per tablet  Commonly known as:  PRINZIDE,ZESTORETIC  Take 1 tablet by mouth daily.     nitroGLYCERIN 0.4 MG SL tablet  Commonly known as:  NITROSTAT  Place 1 tablet (0.4 mg total) under the tongue every 5 (five) minutes x 3 doses as needed for chest pain.     ondansetron 8 MG tablet  Commonly known as:  ZOFRAN  Take 1 tablet (8 mg total) by mouth every 8 (eight) hours as needed for nausea.     RABEprazole 20 MG tablet  Commonly known as:  ACIPHEX  Take 1 tablet (20 mg total) by mouth 2 (two) times daily.     simvastatin 20 MG tablet  Commonly known as:  ZOCOR  Take 20 mg by mouth at bedtime.     sucralfate 1 GM/10ML suspension  Commonly known as:  CARAFATE  Take 1 g by mouth every morning.       No Known Allergies Follow-up Information   Follow up with Patricia Damme, MD. Schedule an appointment as soon as possible for a visit in 1 week.   Contact information:   [address] [place] [address] [phone]       Follow up with Rob Bunting, MD. Schedule an appointment as soon as possible for a visit in 1 week.   Contact information:   [address] [place] [address] [phone]        The results of significant diagnostics from this hospitalization (including imaging, microbiology, ancillary and laboratory) are listed below for  reference.    Significant Diagnostic Studies: Dg Abd Acute W/chest  03/04/2013   *RADIOLOGY REPORT*  Clinical Data: Nausea and vomiting since yesterday, abdominal pain, history gastric bypass surgery, diabetes, hypertension, asthma, breast cancer, coronary artery disease post MI and CABG  ACUTE ABDOMEN SERIES (ABDOMEN 2 VIEW & CHEST 1 VIEW)  Comparison: Chest radiograph 01/06/2013, abdominal radiographs 05/21/2011  Findings: Chronic elevation right diaphragm. Upper normal heart size post median sternotomy. Tortuous aorta. Pulmonary vascularity normal. Chronic right basilar atelectasis. Lungs clear. No pleural effusion or pneumothorax. Surgical clips right axilla.  Diffuse osseous demineralization. Post gastric  surgery. Nonobstructive bowel gas pattern. No bowel dilatation or bowel wall thickening or free intraperitoneal air. Gas present in rectum. No definite urinary tract calcification or acute osseous findings.  IMPRESSION: No acute abnormalities.   Original Report Authenticated By: Ulyses Southward, M.D.    Microbiology: Recent Results (from the past 240 hour(s))  URINE CULTURE     Status: None   Collection Time    03/04/13  1:14 AM      Result Value Range Status   Specimen Description URINE, CLEAN CATCH   Final   Special Requests CX ADDED AT 0133 ON [id number]   Final   Culture  Setup Time 03/04/2013 01:53   Final   Colony Count >=100,000 COLONIES/ML   Final   Culture PSEUDOMONAS AERUGINOSA   Final   Report Status PENDING   Incomplete     Labs: Basic Metabolic Panel:  Recent Labs Lab 03/03/13 0234 03/04/13 0020 03/04/13 0625 03/05/13 0445  NA 137 140 138 138  K 4.1 4.1 4.0 3.7  CL 102 103 106 106  CO2 26 29 21 23   GLUCOSE 169* 148* 160* 173*  BUN 12 12 9  5*  CREATININE 0.70 0.79 0.67 0.70  CALCIUM 9.7 9.7 8.1* 8.6   Liver Function Tests:  Recent Labs Lab 03/03/13 0234 03/04/13 0020  AST 19 17  ALT 18 17  ALKPHOS 89 77  BILITOT 0.3 0.5  PROT 6.6 6.7  ALBUMIN 3.6 3.7     Recent Labs Lab 03/04/13 0020  LIPASE 34   No results found for this basename: AMMONIA,  in the last 168 hours CBC:  Recent Labs Lab 03/03/13 0234 03/04/13 0020 03/04/13 0231 03/04/13 0625 03/04/13 1100 03/05/13 0445  WBC 7.9 8.2  --  6.4  --  7.1  NEUTROABS 2.9 4.3  --   --   --   --   HGB 13.0 13.5 12.3 12.3 12.7 12.4  HCT 39.1 40.6 37.6 38.0 38.9 38.8  MCV 85.6 86.0  --  87.0  --  87.8  PLT 204 203  --  189  --  170   Cardiac Enzymes: No results found for this basename: CKTOTAL, CKMB, CKMBINDEX, TROPONINI,  in the last 168 hours BNP: BNP (last 3 results)  Recent Labs  01/06/13 1956 01/06/13 2109  PROBNP 20.5 21.2   CBG:  Recent Labs Lab 03/04/13 1618 03/04/13 2059 03/05/13 0607 03/05/13 0753 03/05/13 1115  GLUCAP 106* 185* 144* 130* 205*       Signed:  THOMPSON,DANIEL  [facility] 03/05/2013, 11:52 AM

## 2013-03-05 NOTE — Progress Notes (Signed)
Utilization review completed.  P.J. Phillippa Straub,RN,BSN Case Manager [phone]  

## 2013-03-06 ENCOUNTER — Telehealth: Payer: Self-pay | Admitting: Gastroenterology

## 2013-03-06 LAB — URINE CULTURE: Colony Count: >=100000

## 2013-03-06 NOTE — Telephone Encounter (Signed)
Pt has no current bleeding and was given appt for  04/10/13.  She will call with any further complaints or symptoms worsen

## 2013-03-09 ENCOUNTER — Ambulatory Visit (HOSPITAL_COMMUNITY): Payer: Medicaid Other | Attending: Cardiology | Admitting: Radiology

## 2013-03-09 VITALS — BP 133/58 | Ht 59.0 in | Wt 216.0 lb

## 2013-03-09 DIAGNOSIS — I2510 Atherosclerotic heart disease of native coronary artery without angina pectoris: Secondary | ICD-10-CM

## 2013-03-09 DIAGNOSIS — R0602 Shortness of breath: Secondary | ICD-10-CM | POA: Insufficient documentation

## 2013-03-09 DIAGNOSIS — R0609 Other forms of dyspnea: Secondary | ICD-10-CM | POA: Insufficient documentation

## 2013-03-09 DIAGNOSIS — R42 Dizziness and giddiness: Secondary | ICD-10-CM | POA: Insufficient documentation

## 2013-03-09 DIAGNOSIS — R079 Chest pain, unspecified: Secondary | ICD-10-CM | POA: Insufficient documentation

## 2013-03-09 DIAGNOSIS — R0601 Orthopnea: Secondary | ICD-10-CM | POA: Insufficient documentation

## 2013-03-09 DIAGNOSIS — R5381 Other malaise: Secondary | ICD-10-CM | POA: Insufficient documentation

## 2013-03-09 DIAGNOSIS — R0989 Other specified symptoms and signs involving the circulatory and respiratory systems: Secondary | ICD-10-CM | POA: Insufficient documentation

## 2013-03-09 MED ORDER — REGADENOSON 0.4 MG/5ML IV SOLN
0.4000 mg | Freq: Once | INTRAVENOUS | Status: AC
  Administered 2013-03-09: 0.4 mg via INTRAVENOUS

## 2013-03-09 MED ORDER — TECHNETIUM TC 99M SESTAMIBI GENERIC - CARDIOLITE
33.0000 | Freq: Once | INTRAVENOUS | Status: AC | PRN
  Administered 2013-03-09: 33 via INTRAVENOUS

## 2013-03-09 MED ORDER — TECHNETIUM TC 99M SESTAMIBI GENERIC - CARDIOLITE
11.0000 | Freq: Once | INTRAVENOUS | Status: AC | PRN
  Administered 2013-03-09: 11 via INTRAVENOUS

## 2013-03-09 NOTE — Progress Notes (Signed)
  [facility] SITE 3 NUCLEAR MED [address] [place], [place] [address] [phone]    Cardiology Nuclear Med Study  Patricia Cuevas is a 62 y.o. female     MRN : [id number]     DOB: September 17, 1950  Procedure Date: 03/09/2013  Nuclear Med Background Indication for Stress Test:  Evaluation for Ischemia and Graft Patency History:  2013 MPS EF 66% Normal, 2012 Echo EF 65%, 2011 Heart Catheterization  SVG-Dx occluded, 2007 MI, CABG Cardiac Risk Factors: Family History - CAD, History of Smoking, Hypertension, Lipids, NIDDM and Obesity  Symptoms:  Chest Pain, Diaphoresis, Dizziness, DOE, Fatigue, Fatigue with Exertion, Light-Headedness, Nausea, SOB and Vomiting   Nuclear Pre-Procedure Caffeine/Decaff Intake:  None NPO After: 10:00pm   Lungs:  clear O2 Sat: 97% on room air. IV 0.9% NS with Angio Cath:  24g  IV Site: R Wrist  IV Started by:  Doyne Keel, CNMT  Chest Size (in):  48 Cup Size: DD with prosthesis  Height: 4\' 11"  (1.499 m)  Weight:  216 lb (97.977 kg)  BMI:  Body mass index is 43.6 kg/(m^2). Tech Comments:  NA    Nuclear Med Study 1 or 2 day study: 1 day  Stress Test Type:  Lexiscan  Reading MD: Olga Millers, MD  Order Authorizing Provider:  Willa Rough, MD  Resting Radionuclide: Technetium 16m Sestamibi  Resting Radionuclide Dose: 11.0 mCi   Stress Radionuclide:  Technetium 70m Sestamibi  Stress Radionuclide Dose: 33.0 mCi           Stress Protocol Rest HR: 67 Stress HR: 100  Rest BP: 133/58 Stress BP: 144/73  Exercise Time (min): n/a METS: n/a   Predicted Max HR: 157 bpm % Max HR: 63.69 bpm Rate Pressure Product: 21308   Dose of Adenosine (mg):  n/a Dose of Lexiscan: 0.4 mg  Dose of Atropine (mg): n/a Dose of Dobutamine: n/a mcg/kg/min (at max HR)  Stress Test Technologist: Bonnita Levan, RN  Nuclear Technologist:  Domenic Polite, CNMT     Rest Procedure:  Myocardial perfusion imaging was performed at rest 45 minutes following the intravenous  administration of Technetium 64m Sestamibi. Rest ECG: Sinus rhythm, nonspecific ST changes.  Stress Procedure:  The patient received IV Lexiscan 0.4 mg over 15-seconds with concurrent low level exercise and then Technetium 63m Sestamibi was injected at 30-seconds while the patient continued walking one more minute.  Quantitative spect images were obtained after a 45-minute delay. Stress ECG: No significant ST segment change suggestive of ischemia.  QPS Raw Data Images:  Acquisition technically good; normal left ventricular size. Stress Images:  There is decreased uptake in the inferior wall. Rest Images:  There is decreased uptake in the inferior wall. Subtraction (SDS):  No evidence of ischemia. Transient Ischemic Dilatation (Normal <1.22):  0.99 Lung/Heart Ratio (Normal <0.45):  0.39  Quantitative Gated Spect Images QGS EDV:  82 ml QGS ESV:  32 ml  Impression Exercise Capacity:  Lexiscan with no exercise. BP Response:  Normal blood pressure response. Clinical Symptoms:  There is chest burning. ECG Impression:  No significant ST segment change suggestive of ischemia. Comparison with Prior Nuclear Study: No significant change compared to study of 02/10/12.  Overall Impression:  Low risk stress nuclear study with a small, mild intensity, fixed inferior defect consistent with thinning; no ischemia.  LV Ejection Fraction: 61%.  LV Wall Motion:  NL LV Function; NL Wall Motion  Olga Millers

## 2013-03-10 ENCOUNTER — Encounter: Payer: Self-pay | Admitting: Physician Assistant

## 2013-03-10 ENCOUNTER — Telehealth: Payer: Self-pay | Admitting: *Deleted

## 2013-03-10 NOTE — Telephone Encounter (Signed)
Message copied by Tarri Fuller on Fri Mar 10, 2013  2:19 PM ------      Message from: [place], [place] T      Created: Fri Mar 10, 2013 12:51 PM       Please inform patient stress test normal.      Tereso Newcomer, PA-C  12:50 PM 03/10/2013 ------

## 2013-03-10 NOTE — Telephone Encounter (Signed)
pt notified about normal myoview with verbal understanding today, verified appt 7/30 @ 3:45 with Dr. Myrtis Ser, pt said and thank you

## 2013-03-10 NOTE — Telephone Encounter (Signed)
lmptcb for normal myoview results; machine did not state pt's name or phone #.

## 2013-04-10 ENCOUNTER — Encounter: Payer: Self-pay | Admitting: Gastroenterology

## 2013-04-10 ENCOUNTER — Telehealth: Payer: Self-pay | Admitting: Hematology and Oncology

## 2013-04-10 ENCOUNTER — Ambulatory Visit (INDEPENDENT_AMBULATORY_CARE_PROVIDER_SITE_OTHER): Payer: Medicaid Other | Admitting: Gastroenterology

## 2013-04-10 VITALS — BP 110/70 | HR 68 | Ht <= 58 in | Wt 215.2 lb

## 2013-04-10 DIAGNOSIS — K219 Gastro-esophageal reflux disease without esophagitis: Secondary | ICD-10-CM

## 2013-04-10 MED ORDER — RABEPRAZOLE SODIUM 20 MG PO TBEC: 20.0000 mg | DELAYED_RELEASE_TABLET | Freq: Every day | ORAL | Status: DC

## 2013-04-10 NOTE — Progress Notes (Signed)
Review of pertinent gastrointestinal problems: 1. Gastric ulcer, H. Pylori +EGD [place], Dr. Noe Gens, 1/11: ?4cm Barretts, s/p gastric bypass anatomy, 7mm antral ulcer; biopsies showed NO Barrett's, + H. Pylori  EGD [place], Dr. Noe Gens, 7/11; 7mm persistent Antral ulcer, s/p gastric bypass anatomy, "gastric mucosa @ distal 3cm of esophagus:" +/- for Barrett's changes, + h pylori. Treated per his office.  EGD Dr. Arlyce Dice 2012 'dysphagia, CP." He described "minimal GE narrowing" which was dilated up to 18mm with maloney, ?stricture at remote GJ anastomosis site 2. Adenomatous colon polyps: Colonoscopy Dr. Noe Gens, 10/2009: 2 8mm polyps removed; one was serrated adenoma, the other HP; recommended to have repeat in 1 year due to poor prep. I did colonoscopy for her 1 year prior, good prep, removed one TA, already in for recall in 2015 which is a better interval than Dr. Noe Gens'.  (Colonoscopy Dr. Christella Hartigan 11/2008; 2 small TAs removed, +hems, she was recommended to have repeat colonoscopy at 5 year interval)  HPI: This is a   very pleasant 62 year old woman whom I am seeing for the first time in about 2 years.    Was given 'pain pills' from Dr. Katrinka Blazing but these made her sleeping. Started BC powders instead.  Was told to stop those by Dr. [name].  Takes vicodin instead for pain, knee pains.  Back pains.  No more overt bleeding.  Cbc last month was normal.  Was seen in hosp by Dr. [name] for CGE, hemoglobin normal. Was taking several BCs per day, told to stop.    Past Medical History  Diagnosis Date  . Hx of CABG     2007  . Hypertension   . Diabetes mellitus   . Ejection fraction     EF 65%, echo, [place], Jan 16, 2011  . Sleep apnea     CPAP being arranged May, 2011  . Dyslipidemia   . CAD (coronary artery disease)     Nuclear stress test Feb 11, 2011, EF 64%, no scar or ischemia    //         catheterization, November, 2011,patent LIMA-LAD-small caliber distal vessel with diffuse plaque, patent SVG to  OM1 and OM 2, patent SVG to PDA and PLA, occluded SVG to diagonal, medical therapy;  Lexiscan Myoview 6/14:  Inferior thinning, no ischemia, EF 61%, low risk  . Overweight(278.02)     stomach stapling 1985 followed by weight loss, then return of weight  . Tobacco abuse     in the past, resolved  . Alcohol abuse     in the past, resolved  . Nausea & vomiting     hospitalization, November, 2011, stricture GE junction, questionable stenosis gastrojejunostomy, disruptive primary peristaltic wave, GE reflux, delayed emptying proximal gastric pouch  . Hx of colonic polyps   . Asthma   . Myocardial infarction   . Breast cancer     Double mastectomy 2007; s/p tamoxifen and arimidex therapy; no recurrence since 05/2008    Past Surgical History  Procedure Laterality Date  . Coronary artery bypass graft    . Mastectomy    . Coronary stent placement    . Abdominal hysterectomy      Current Outpatient Prescriptions  Medication Sig Dispense Refill  . acetaminophen (TYLENOL) 325 MG tablet Take 2 tablets (650 mg total) by mouth every 6 (six) hours as needed for pain.      [name] Kitchen albuterol (PROVENTIL HFA;VENTOLIN HFA) 108 (90 BASE) MCG/ACT inhaler Inhale 2 puffs into the lungs every 6 (six)  hours as needed. For shortness of breath      . aspirin EC 81 MG tablet Take 81 mg by mouth every other day.       . lisinopril-hydrochlorothiazide (PRINZIDE,ZESTORETIC) 20-12.5 MG per tablet Take 1 tablet by mouth daily.        . nitroGLYCERIN (NITROSTAT) 0.4 MG SL tablet Place 1 tablet (0.4 mg total) under the tongue every 5 (five) minutes x 3 doses as needed for chest pain.  25 tablet  3  . ondansetron (ZOFRAN) 8 MG tablet Take 1 tablet (8 mg total) by mouth every 8 (eight) hours as needed for nausea.  12 tablet  0  . RABEprazole (ACIPHEX) 20 MG tablet Take 1 tablet (20 mg total) by mouth 2 (two) times daily.  60 tablet  0  . simvastatin (ZOCOR) 20 MG tablet Take 20 mg by mouth at bedtime.       . sucralfate  (CARAFATE) 1 GM/10ML suspension Take 1 g by mouth every morning.        No current facility-administered medications for this visit.    Allergies as of 04/10/2013  . (No Known Allergies)    Family History  Problem Relation Age of Onset  . Hypertension Mother   . Heart disease Mother   . Diabetes Mother   . Heart disease Father   . Heart disease Sister   . Stomach cancer Sister   . Asthma Sister   . Colon cancer Father     History   Social History  . Marital Status: Single    Spouse Name: N/A    Number of Children: N/A  . Years of Education: N/A   Occupational History  . Disabled    Social History Main Topics  . Smoking status: Former Smoker -- 3.00 packs/day for 27 years    Types: Cigarettes    Quit date: 09/14/2000  . Smokeless tobacco: Never Used  . Alcohol Use: No     Comment: History of alcohol abuse but quit 5 years ago  . Drug Use: No     Comment: Used to use Marijuan but stopped 5 years ago.   [name] Kitchen Sexually Active: Not on file   Other Topics Concern  . Not on file   Social History Narrative  . No narrative on file      Physical Exam: BP 110/70  Pulse 68  Ht 4' 9.75" (1.467 m)  Wt 215 lb 4 oz (97.637 kg)  BMI 45.37 kg/m2 Constitutional: generally well-appearing Psychiatric: alert and oriented x3 Abdomen: soft, nontender, nondistended, no obvious ascites, no peritoneal signs, normal bowel sounds     Assessment and plan: 62 y.o. female with resolved, minor GI bleeding likely from NSAIDs  She will continue to avoid BC powders and other NSAIDs. She is morbidly obese I recommended weight loss as a way to help control her knee, back pain as well. I think she can decrease her proton pump inhibitor to once daily dosing for as long as she requires to be on aspirin every day.

## 2013-04-10 NOTE — Telephone Encounter (Signed)
Pt came by and r/s appt to August 2014 lab and MD

## 2013-04-10 NOTE — Patient Instructions (Addendum)
You should continue to completely avoid NSAID type pain medicines (BC powders, alleve, ibuprofen and many others) because they are causing you to have serious stomach problems. You can decrease your aciphex to one pill once daily (for as long as you need aspirin every day).

## 2013-04-12 ENCOUNTER — Ambulatory Visit: Payer: Medicaid Other | Admitting: Cardiology

## 2013-04-22 ENCOUNTER — Telehealth: Payer: Self-pay | Admitting: Hematology and Oncology

## 2013-04-22 NOTE — Telephone Encounter (Signed)
Moved 8/14 appt from CP1 to AJ due to CP1 out. Pt has transport issues and needs appt to stay 8/14.

## 2013-04-27 ENCOUNTER — Telehealth: Payer: Self-pay | Admitting: Medical Oncology

## 2013-04-27 ENCOUNTER — Ambulatory Visit: Payer: Medicaid Other | Admitting: Physician Assistant

## 2013-04-27 ENCOUNTER — Other Ambulatory Visit: Payer: Medicaid Other | Admitting: Lab

## 2013-04-27 NOTE — Telephone Encounter (Signed)
I called pt to inquire about her appointment today. According to Dr. Mamie Levers last note she was going to follow up with her primary MD. Pt states that she has not had a mammogram in a long time. We called her primary care and pt was seen there in July. Due to pt having double mastectomies she no longer needs mammograms or MRIs. I discussed this with the patient and she will continue to follow up with her primary. Appointments cancelled.

## 2013-05-05 ENCOUNTER — Encounter: Payer: Self-pay | Admitting: Cardiology

## 2013-05-08 ENCOUNTER — Ambulatory Visit (INDEPENDENT_AMBULATORY_CARE_PROVIDER_SITE_OTHER): Payer: Medicaid Other | Admitting: Cardiology

## 2013-05-08 ENCOUNTER — Encounter: Payer: Self-pay | Admitting: Cardiology

## 2013-05-08 VITALS — BP 110/62 | HR 84 | Ht 59.0 in | Wt 216.0 lb

## 2013-05-08 DIAGNOSIS — I10 Essential (primary) hypertension: Secondary | ICD-10-CM

## 2013-05-08 DIAGNOSIS — E785 Hyperlipidemia, unspecified: Secondary | ICD-10-CM

## 2013-05-08 DIAGNOSIS — I2510 Atherosclerotic heart disease of native coronary artery without angina pectoris: Secondary | ICD-10-CM

## 2013-05-08 NOTE — Assessment & Plan Note (Signed)
Blood pressure is controlled. No change in therapy. 

## 2013-05-08 NOTE — Assessment & Plan Note (Signed)
Lipids are being treated. No change in therapy. 

## 2013-05-08 NOTE — Assessment & Plan Note (Signed)
Coronary disease is stable. Nuclear scan June, 2014, no ischemia. Ejection fraction 61%. No further workup is needed.

## 2013-05-08 NOTE — Patient Instructions (Addendum)
Your physician recommends that you continue on your current medications as directed. Please refer to the Current Medication list given to you today.  Your physician wants you to follow-up in: 1 year. You will receive a reminder letter in the mail two months in advance. If you don't receive a letter, please call our office to schedule the follow-up appointment.  

## 2013-05-08 NOTE — Progress Notes (Signed)
HPI  Patient is seen today to followup coronary artery disease. She had been seen in the office last in June, 2014, by Mr. Alben Spittle. She had been admitted to the hospital twice. There is a history of bypass surgery in 2009. Decision at the last office visit was made to proceed with nuclear stress testing. This was done on March 09, 2013. The study showed an ejection fraction of 61%. It was a low risk study. There was a small inferior fixed defect that could be thinning. There was no ischemia. The patient is feeling much better. It sounds like her symptoms were probably GI in origin.  No Known Allergies  Current Outpatient Prescriptions  Medication Sig Dispense Refill  . acetaminophen (TYLENOL) 325 MG tablet Take 2 tablets (650 mg total) by mouth every 6 (six) hours as needed for pain.      [name] Kitchen albuterol (PROVENTIL HFA;VENTOLIN HFA) 108 (90 BASE) MCG/ACT inhaler Inhale 2 puffs into the lungs every 6 (six) hours as needed. For shortness of breath      . aspirin EC 81 MG tablet Take 81 mg by mouth every other day.       . lisinopril-hydrochlorothiazide (PRINZIDE,ZESTORETIC) 20-12.5 MG per tablet Take 1 tablet by mouth daily.        . nitroGLYCERIN (NITROSTAT) 0.4 MG SL tablet Place 1 tablet (0.4 mg total) under the tongue every 5 (five) minutes x 3 doses as needed for chest pain.  25 tablet  3  . ondansetron (ZOFRAN) 8 MG tablet Take 1 tablet (8 mg total) by mouth every 8 (eight) hours as needed for nausea.  12 tablet  0  . RABEprazole (ACIPHEX) 20 MG tablet Take 1 tablet (20 mg total) by mouth daily.  60 tablet  0  . simvastatin (ZOCOR) 20 MG tablet Take 20 mg by mouth at bedtime.       . sucralfate (CARAFATE) 1 GM/10ML suspension Take 1 g by mouth every morning.        No current facility-administered medications for this visit.    History   Social History  . Marital Status: Single    Spouse Name: N/A    Number of Children: N/A  . Years of Education: N/A   Occupational History  . Disabled     Social History Main Topics  . Smoking status: Former Smoker -- 3.00 packs/day for 27 years    Types: Cigarettes    Quit date: 09/14/2000  . Smokeless tobacco: Never Used  . Alcohol Use: No     Comment: History of alcohol abuse but quit 5 years ago  . Drug Use: No     Comment: Used to use Marijuan but stopped 5 years ago.   [name] Kitchen Sexual Activity: Not on file   Other Topics Concern  . Not on file   Social History Narrative  . No narrative on file    Family History  Problem Relation Age of Onset  . Hypertension Mother   . Heart disease Mother   . Diabetes Mother   . Heart disease Father   . Heart disease Sister   . Stomach cancer Sister   . Asthma Sister   . Colon cancer Father     Past Medical History  Diagnosis Date  . Hx of CABG     2007  . Hypertension   . Diabetes mellitus   . Ejection fraction     EF 65%, echo, [place], Jan 16, 2011  . Sleep apnea  CPAP being arranged May, 2011  . Dyslipidemia   . CAD (coronary artery disease)     Nuclear stress test Feb 11, 2011, EF 64%, no scar or ischemia    //         catheterization, November, 2011,patent LIMA-LAD-small caliber distal vessel with diffuse plaque, patent SVG to OM1 and OM 2, patent SVG to PDA and PLA, occluded SVG to diagonal, medical therapy;  Lexiscan Myoview 6/14:  Inferior thinning, no ischemia, EF 61%, low risk  . Overweight(278.02)     stomach stapling 1985 followed by weight loss, then return of weight  . Tobacco abuse     in the past, resolved  . Alcohol abuse     in the past, resolved  . Nausea & vomiting     hospitalization, November, 2011, stricture GE junction, questionable stenosis gastrojejunostomy, disruptive primary peristaltic wave, GE reflux, delayed emptying proximal gastric pouch  . Hx of colonic polyps   . Asthma   . Myocardial infarction   . Breast cancer     Double mastectomy 2007; s/p tamoxifen and arimidex therapy; no recurrence since 05/2008    Past Surgical History    Procedure Laterality Date  . Coronary artery bypass graft    . Mastectomy    . Coronary stent placement    . Abdominal hysterectomy      Patient Active Problem List   Diagnosis Date Noted  . Hx of CABG     Priority: High  . Dyslipidemia     Priority: High  . Ejection fraction     Priority: High  . Upper GI bleed 03/04/2013  . Atypical chest pain 01/07/2013  . History of tobacco abuse 01/07/2013  . SIRS (systemic inflammatory response syndrome) 07/28/2012  . Tachycardia 07/28/2012  . GERD (gastroesophageal reflux disease) 02/24/2011  . CAD (coronary artery disease)   . Overweight   . OSA (obstructive sleep apnea)   . Nausea & vomiting   . Breast cancer   . Hypertension   . Diabetes mellitus     ROS   Patient denies fever, chills, headache, sweats, rash, change in vision, change in hearing, chest pain, cough, nausea vomiting, urinary symptoms. All other systems are reviewed and are negative.  PHYSICAL EXAM  Patient is overweight. She is oriented to person time and place. Affect is normal. There is no jugulovenous distention. Lungs are clear. Respiratory effort is nonlabored. Cardiac exam reveals S1 and S2. There no clicks or significant murmurs. The abdomen is soft. There is no peripheral edema.  Filed Vitals:   05/08/13 1455  BP: 110/62  Pulse: 84  Height: 4\' 11"  (1.499 m)  Weight: 216 lb (97.977 kg)     ASSESSMENT & PLAN

## 2013-06-02 ENCOUNTER — Telehealth: Payer: Self-pay

## 2013-06-02 NOTE — Telephone Encounter (Signed)
Faxed signed order for mastectomy to [facility].  Sent a copy to be scanned into patient's EMR. Confirmed receipt with [facility].

## 2013-06-06 ENCOUNTER — Emergency Department (HOSPITAL_COMMUNITY): Payer: Medicaid Other

## 2013-06-06 ENCOUNTER — Emergency Department (HOSPITAL_COMMUNITY)
Admission: EM | Admit: 2013-06-06 | Discharge: 2013-06-06 | Disposition: A | Discharge status: Home / Self Care | Payer: Medicaid Other | Attending: Emergency Medicine | Admitting: Emergency Medicine

## 2013-06-06 ENCOUNTER — Encounter (HOSPITAL_COMMUNITY): Payer: Self-pay | Admitting: Emergency Medicine

## 2013-06-06 DIAGNOSIS — M79609 Pain in unspecified limb: Secondary | ICD-10-CM | POA: Insufficient documentation

## 2013-06-06 DIAGNOSIS — Z860100 Personal history of colon polyps, unspecified: Secondary | ICD-10-CM | POA: Insufficient documentation

## 2013-06-06 DIAGNOSIS — E663 Overweight: Secondary | ICD-10-CM | POA: Insufficient documentation

## 2013-06-06 DIAGNOSIS — F1010 Alcohol abuse, uncomplicated: Secondary | ICD-10-CM | POA: Insufficient documentation

## 2013-06-06 DIAGNOSIS — Z87891 Personal history of nicotine dependence: Secondary | ICD-10-CM | POA: Insufficient documentation

## 2013-06-06 DIAGNOSIS — I2510 Atherosclerotic heart disease of native coronary artery without angina pectoris: Secondary | ICD-10-CM | POA: Insufficient documentation

## 2013-06-06 DIAGNOSIS — Z86718 Personal history of other venous thrombosis and embolism: Secondary | ICD-10-CM | POA: Insufficient documentation

## 2013-06-06 DIAGNOSIS — J45909 Unspecified asthma, uncomplicated: Secondary | ICD-10-CM | POA: Insufficient documentation

## 2013-06-06 DIAGNOSIS — Z79899 Other long term (current) drug therapy: Secondary | ICD-10-CM | POA: Insufficient documentation

## 2013-06-06 DIAGNOSIS — R209 Unspecified disturbances of skin sensation: Secondary | ICD-10-CM | POA: Insufficient documentation

## 2013-06-06 DIAGNOSIS — Z951 Presence of aortocoronary bypass graft: Secondary | ICD-10-CM | POA: Insufficient documentation

## 2013-06-06 DIAGNOSIS — Z853 Personal history of malignant neoplasm of breast: Secondary | ICD-10-CM | POA: Insufficient documentation

## 2013-06-06 DIAGNOSIS — I252 Old myocardial infarction: Secondary | ICD-10-CM | POA: Insufficient documentation

## 2013-06-06 DIAGNOSIS — M5416 Radiculopathy, lumbar region: Secondary | ICD-10-CM

## 2013-06-06 DIAGNOSIS — R0989 Other specified symptoms and signs involving the circulatory and respiratory systems: Secondary | ICD-10-CM | POA: Insufficient documentation

## 2013-06-06 DIAGNOSIS — E785 Hyperlipidemia, unspecified: Secondary | ICD-10-CM | POA: Insufficient documentation

## 2013-06-06 DIAGNOSIS — I10 Essential (primary) hypertension: Secondary | ICD-10-CM | POA: Insufficient documentation

## 2013-06-06 DIAGNOSIS — M79604 Pain in right leg: Secondary | ICD-10-CM

## 2013-06-06 DIAGNOSIS — Z7982 Long term (current) use of aspirin: Secondary | ICD-10-CM | POA: Insufficient documentation

## 2013-06-06 DIAGNOSIS — IMO0002 Reserved for concepts with insufficient information to code with codable children: Secondary | ICD-10-CM | POA: Insufficient documentation

## 2013-06-06 DIAGNOSIS — E119 Type 2 diabetes mellitus without complications: Secondary | ICD-10-CM | POA: Insufficient documentation

## 2013-06-06 DIAGNOSIS — M25559 Pain in unspecified hip: Secondary | ICD-10-CM | POA: Insufficient documentation

## 2013-06-06 DIAGNOSIS — G4730 Sleep apnea, unspecified: Secondary | ICD-10-CM | POA: Insufficient documentation

## 2013-06-06 MED ORDER — HYDROCODONE-ACETAMINOPHEN 5-325 MG PO TABS
1.0000 | ORAL_TABLET | Freq: Once | ORAL | Status: AC
  Administered 2013-06-06: 1 via ORAL
  Filled 2013-06-06: qty 1

## 2013-06-06 MED ORDER — HYDROCODONE-ACETAMINOPHEN 5-325 MG PO TABS: 1.0000 | ORAL_TABLET | Freq: Four times a day (QID) | ORAL | Status: DC | PRN

## 2013-06-06 MED ORDER — IBUPROFEN 400 MG PO TABS
600.0000 mg | ORAL_TABLET | Freq: Once | ORAL | Status: AC
  Administered 2013-06-06: 600 mg via ORAL
  Filled 2013-06-06 (×2): qty 1

## 2013-06-06 NOTE — ED Notes (Signed)
Patient returned from Radiology. 

## 2013-06-06 NOTE — ED Provider Notes (Signed)
CSN: [id number]     Arrival date & time 06/06/13  1235 History   First MD Initiated Contact with Patient 06/06/13 1434     Chief Complaint  Patient presents with  . Leg Pain  . Hip Pain   (Consider location/radiation/quality/duration/timing/severity/associated sxs/prior Treatment) HPI Patricia Cuevas is a 62 y.o. female who presents to emergency department with complaint of right hip and right lower leg pain. Patient states she woke up with pain this morning. States pain started with a tingling sensation in the bottom of her right heel and mid foot. States there is no tingling or pain in her toes. States pain radiating into right calf and in the right medial thigh. Patient states she's also feeling pain in the right hip and right lower back. States pain is worsened with movement of her leg. Also painful to bear weight. Patient denies any strenuous physical activity yesterday states that on the couch all day and ate. Patient states she has had extensive cardiac history and since then has been not very active. Patient denies any prior back or hip pain. She was told she might have some arthritis. She states she takes Tylenol for her pain and took some Tylenol this morning with no relief. Patient states that she has not had any abdominal pain or urinary symptoms. She has not had any injuries. She has not had any fever, chills, malaise. She states that she does have history of blood clots after she had a breast surgery, states had double mastectomy for breast cancer. Patient states she's not on any blood thinners at this time. Patient denies any chest pain or shortness of breath. Past Medical History  Diagnosis Date  . Hx of CABG     2007  . Hypertension   . Diabetes mellitus   . Ejection fraction     EF 65%, echo, [place], Jan 16, 2011  . Sleep apnea     CPAP being arranged May, 2011  . Dyslipidemia   . CAD (coronary artery disease)     Nuclear stress test Feb 11, 2011, EF 64%, no scar or  ischemia    //         catheterization, November, 2011,patent LIMA-LAD-small caliber distal vessel with diffuse plaque, patent SVG to OM1 and OM 2, patent SVG to PDA and PLA, occluded SVG to diagonal, medical therapy;  Lexiscan Myoview 6/14:  Inferior thinning, no ischemia, EF 61%, low risk  . Overweight(278.02)     stomach stapling 1985 followed by weight loss, then return of weight  . Tobacco abuse     in the past, resolved  . Alcohol abuse     in the past, resolved  . Nausea & vomiting     hospitalization, November, 2011, stricture GE junction, questionable stenosis gastrojejunostomy, disruptive primary peristaltic wave, GE reflux, delayed emptying proximal gastric pouch  . Hx of colonic polyps   . Asthma   . Myocardial infarction   . Breast cancer     Double mastectomy 2007; s/p tamoxifen and arimidex therapy; no recurrence since 05/2008   Past Surgical History  Procedure Laterality Date  . Coronary artery bypass graft    . Mastectomy    . Coronary stent placement    . Abdominal hysterectomy     Family History  Problem Relation Age of Onset  . Hypertension Mother   . Heart disease Mother   . Diabetes Mother   . Heart disease Father   . Heart disease Sister   .  Stomach cancer Sister   . Asthma Sister   . Colon cancer Father    History  Substance Use Topics  . Smoking status: Former Smoker -- 3.00 packs/day for 27 years    Types: Cigarettes    Quit date: 09/14/2000  . Smokeless tobacco: Never Used  . Alcohol Use: No     Comment: History of alcohol abuse but quit 5 years ago   OB History   Grav Para Term Preterm Abortions TAB SAB Ect Mult Living                 Review of Systems  Constitutional: Negative for fever and chills.  HENT: Negative for neck pain and neck stiffness.   Respiratory: Negative for cough, chest tightness and shortness of breath.   Cardiovascular: Negative for chest pain, palpitations and leg swelling.  Gastrointestinal: Negative for nausea,  vomiting, abdominal pain and diarrhea.  Genitourinary: Negative for dysuria and flank pain.  Musculoskeletal: Positive for back pain and arthralgias. Negative for myalgias.  Skin: Negative for rash and wound.  Neurological: Negative for dizziness, weakness, numbness and headaches.  All other systems reviewed and are negative.    Allergies  Review of patient's allergies indicates no known allergies.  Home Medications   Current Outpatient Rx  Name  Route  Sig  Dispense  Refill  . acetaminophen (TYLENOL) 325 MG tablet   Oral   Take 2 tablets (650 mg total) by mouth every 6 (six) hours as needed for pain.         [name] Kitchen albuterol (PROVENTIL HFA;VENTOLIN HFA) 108 (90 BASE) MCG/ACT inhaler   Inhalation   Inhale 2 puffs into the lungs every 6 (six) hours as needed. For shortness of breath         . aspirin EC 81 MG tablet   Oral   Take 81 mg by mouth every other day.          . lisinopril-hydrochlorothiazide (PRINZIDE,ZESTORETIC) 20-12.5 MG per tablet   Oral   Take 1 tablet by mouth daily.           . nitroGLYCERIN (NITROSTAT) 0.4 MG SL tablet   Sublingual   Place 1 tablet (0.4 mg total) under the tongue every 5 (five) minutes x 3 doses as needed for chest pain.   25 tablet   3   . ondansetron (ZOFRAN) 8 MG tablet   Oral   Take 1 tablet (8 mg total) by mouth every 8 (eight) hours as needed for nausea.   12 tablet   0   . RABEprazole (ACIPHEX) 20 MG tablet   Oral   Take 1 tablet (20 mg total) by mouth daily.   60 tablet   0   . simvastatin (ZOCOR) 20 MG tablet   Oral   Take 20 mg by mouth at bedtime.          . sucralfate (CARAFATE) 1 GM/10ML suspension   Oral   Take 1 g by mouth every morning.           BP 159/79  Pulse 76  Temp(Src) 97.9 F (36.6 C) (Oral)  Resp 16  SpO2 98% Physical Exam  Nursing note and vitals reviewed. Constitutional: She appears well-developed and well-nourished. No distress.  HENT:  Head: Normocephalic.  Eyes: Conjunctivae  are normal.  Neck: Neck supple.  Cardiovascular: Normal rate, regular rhythm and normal heart sounds.   Pulmonary/Chest: Effort normal and breath sounds normal. No respiratory distress. She has no wheezes. She has no  rales.  Abdominal: Soft. Bowel sounds are normal. She exhibits no distension. There is no tenderness. There is no rebound.  Musculoskeletal: Normal range of motion. She exhibits no edema.  Mild tenderness in the right SI joint and right hip joint. Pain with range of motion of the right hip both flexed knee and straight leg. Pain with hip flexion, hip and eversion, hip he version. Normal knee exam. There is mild swelling in the right ankle and foot. Dorsal pedal pulses are equal and intact bilaterally. There is tenderness of her right plantar fascia and right heel with no swelling or lesions noted. There is tenderness in the right calf and right medial thigh. Positive Homans sign.  Neurological: She is alert.  5.5 and equal LE strength bilaterally  Skin: Skin is warm and dry.  Psychiatric: She has a normal mood and affect. Her behavior is normal.    ED Course  Procedures (including critical care time) Labs Review Labs Reviewed - No data to display Imaging Review Dg Lumbar Spine Complete  06/06/2013   *RADIOLOGY REPORT*  Clinical Data: No known injury.  Lumbar spine pain with right hip and leg pain.  LUMBAR SPINE - COMPLETE 4+ VIEW  Comparison: CT abdomen pelvis bone windows 05/21/2011.  Findings: Disc space narrowing L5-S1.  Anatomic alignment.  No compression fracture or traumatic subluxation.  Surgical clips in the abdomen.  No osseous destructive lesion.  Lower lumbar facet arthropathy. Vascular calcification.  IMPRESSION: Lumbar degenerative change as described.  No acute findings. Similar appearance to prior CT.   Original Report Authenticated By: Davonna Belling, M.D.   Dg Hip Complete Right  06/06/2013   *RADIOLOGY REPORT*  Clinical Data: Right hip pain.  No known injury.  RIGHT  HIP - COMPLETE 2+ VIEW  Comparison: None.  Findings: There is no evidence of fracture or dislocation.  There is no evidence of arthropathy or other focal bony abnormality. Soft tissues are unremarkable.  IMPRESSION: No acute findings.   Original Report Authenticated By: Davonna Belling, M.D.    Author: Gwendolyn Fill Service: Vascular Lab Author Type: Cardiovascular Sonographer   Filed: 06/06/2013 3:27 PM Note Time: 06/06/2013 3:25 PM         *Preliminary Results*  Right lower extremity venous duplex completed.  Right lower extremity is negative for deep vein thrombosis. There is no evidence of right Baker's cyst.  Preliminary results discussed with Dr.Lockwood.  06/06/2013 3:25 PM  Gertie Fey, RVT, RDCS, RDMS    . MDM   1. Lumbar radiculopathy   2. Right leg pain     3:06 PM Patient seen and examined. Nontraumatic pain in the right lower back and right hip as well as right medial thigh, right calf, right foot. 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 She also has history of breast cancer in remission. At present she is neurovascularly intact with normal strength in her foot and normal dorsal pedal pulses. Her foot is warm. Normal sensation. I will get x-rays of her lower back and hip. Also will get venous Doppler to rule out a DVT given she has calf tenderness, Homans test is positive.  4:25 PM Patient's venous Doppler is negative. Her lumbar spine films showed degenerative changes, her hip x-ray is unremarkable. Patient feels better after taking Vicodin in emergency department. She has no signs of infection, she is afebrile. No injuries. She is able to bear weight on the right leg. She has normal pulses distally. She is able to move them  every joint. At this time she is stable for discharge home with further outpatient followup. I will prescribe her Vicodin to take at home for pain. I instructed her to return if any changes in her symptoms.   Filed Vitals:    06/06/13 1242 06/06/13 1457  BP: 158/80 159/79  Pulse: 80 76  Temp: 97.9 F (36.6 C)   TempSrc: Oral   Resp: 17 16  SpO2: 96% 98%     Lottie Mussel, PA-C 06/06/13 1632

## 2013-06-06 NOTE — ED Notes (Signed)
Patient transported to X-ray 

## 2013-06-06 NOTE — ED Provider Notes (Signed)
  Medical screening examination/treatment/procedure(s) were performed by non-physician practitioner and as supervising physician I was immediately available for consultation/collaboration.    Gerhard Munch, MD 06/06/13 [phone]

## 2013-06-06 NOTE — Progress Notes (Signed)
*  Preliminary Results* Right lower extremity venous duplex completed. Right lower extremity is negative for deep vein thrombosis. There is no evidence of right Baker's cyst.  Preliminary results discussed with Dr.Lockwood.  06/06/2013 3:25 PM  Gertie Fey, RVT, RDCS, RDMS

## 2013-06-06 NOTE — ED Notes (Signed)
Pt c/o right leg and hip pain starting today; pt denies swelling or injury

## 2013-06-09 ENCOUNTER — Other Ambulatory Visit: Payer: Self-pay | Admitting: Family Medicine

## 2013-06-09 DIAGNOSIS — IMO0002 Reserved for concepts with insufficient information to code with codable children: Secondary | ICD-10-CM

## 2013-06-15 ENCOUNTER — Ambulatory Visit
Admission: RE | Admit: 2013-06-15 | Discharge: 2013-06-15 | Disposition: A | Discharge status: Home / Self Care | Payer: Medicaid Other | Source: Ambulatory Visit | Attending: Family Medicine | Admitting: Family Medicine

## 2013-06-15 DIAGNOSIS — IMO0002 Reserved for concepts with insufficient information to code with codable children: Secondary | ICD-10-CM

## 2013-07-04 ENCOUNTER — Encounter (HOSPITAL_COMMUNITY): Payer: Self-pay | Admitting: Emergency Medicine

## 2013-07-04 ENCOUNTER — Emergency Department (HOSPITAL_COMMUNITY): Payer: Medicaid Other

## 2013-07-04 ENCOUNTER — Emergency Department (HOSPITAL_COMMUNITY)
Admission: EM | Admit: 2013-07-04 | Discharge: 2013-07-04 | Disposition: A | Discharge status: Home / Self Care | Payer: Medicaid Other | Attending: Emergency Medicine | Admitting: Emergency Medicine

## 2013-07-04 DIAGNOSIS — I10 Essential (primary) hypertension: Secondary | ICD-10-CM | POA: Insufficient documentation

## 2013-07-04 DIAGNOSIS — Z860100 Personal history of colon polyps, unspecified: Secondary | ICD-10-CM | POA: Insufficient documentation

## 2013-07-04 DIAGNOSIS — I2510 Atherosclerotic heart disease of native coronary artery without angina pectoris: Secondary | ICD-10-CM | POA: Insufficient documentation

## 2013-07-04 DIAGNOSIS — Z9861 Coronary angioplasty status: Secondary | ICD-10-CM | POA: Insufficient documentation

## 2013-07-04 DIAGNOSIS — R1033 Periumbilical pain: Secondary | ICD-10-CM | POA: Insufficient documentation

## 2013-07-04 DIAGNOSIS — I252 Old myocardial infarction: Secondary | ICD-10-CM | POA: Insufficient documentation

## 2013-07-04 DIAGNOSIS — Z7982 Long term (current) use of aspirin: Secondary | ICD-10-CM | POA: Insufficient documentation

## 2013-07-04 DIAGNOSIS — R350 Frequency of micturition: Secondary | ICD-10-CM | POA: Insufficient documentation

## 2013-07-04 DIAGNOSIS — E785 Hyperlipidemia, unspecified: Secondary | ICD-10-CM | POA: Insufficient documentation

## 2013-07-04 DIAGNOSIS — Z853 Personal history of malignant neoplasm of breast: Secondary | ICD-10-CM | POA: Insufficient documentation

## 2013-07-04 DIAGNOSIS — Z90710 Acquired absence of both cervix and uterus: Secondary | ICD-10-CM | POA: Insufficient documentation

## 2013-07-04 DIAGNOSIS — Z87891 Personal history of nicotine dependence: Secondary | ICD-10-CM | POA: Insufficient documentation

## 2013-07-04 DIAGNOSIS — E663 Overweight: Secondary | ICD-10-CM | POA: Insufficient documentation

## 2013-07-04 DIAGNOSIS — F1021 Alcohol dependence, in remission: Secondary | ICD-10-CM | POA: Insufficient documentation

## 2013-07-04 DIAGNOSIS — R630 Anorexia: Secondary | ICD-10-CM | POA: Insufficient documentation

## 2013-07-04 DIAGNOSIS — E119 Type 2 diabetes mellitus without complications: Secondary | ICD-10-CM | POA: Insufficient documentation

## 2013-07-04 DIAGNOSIS — R109 Unspecified abdominal pain: Secondary | ICD-10-CM

## 2013-07-04 DIAGNOSIS — Z79899 Other long term (current) drug therapy: Secondary | ICD-10-CM | POA: Insufficient documentation

## 2013-07-04 DIAGNOSIS — Z951 Presence of aortocoronary bypass graft: Secondary | ICD-10-CM | POA: Insufficient documentation

## 2013-07-04 LAB — COMPREHENSIVE METABOLIC PANEL
ALT: 17 U/L (ref 0–35)
AST: 20 U/L (ref 0–37)
Albumin: 3.4 g/dL — ABNORMAL LOW (ref 3.5–5.2)
Alkaline Phosphatase: 82 U/L (ref 39–117)
BUN: 10 mg/dL (ref 6–23)
CO2: 24 mEq/L (ref 19–32)
Calcium: 9.2 mg/dL (ref 8.4–10.5)
Chloride: 105 mEq/L (ref 96–112)
Creatinine, Ser: 0.75 mg/dL (ref 0.50–1.10)
GFR calc Af Amer: >90 mL/min (ref >90)
GFR calc non Af Amer: 88 mL/min — ABNORMAL LOW (ref >90)
Glucose, Bld: 145 mg/dL — ABNORMAL HIGH (ref 70–99)
Potassium: 4 mEq/L (ref 3.5–5.1)
Sodium: 139 mEq/L (ref 135–145)
Total Bilirubin: 0.2 mg/dL — ABNORMAL LOW (ref 0.3–1.2)
Total Protein: 6.6 g/dL (ref 6.0–8.3)

## 2013-07-04 LAB — CBC WITH DIFFERENTIAL/PLATELET
Basophils Absolute: 0 10*3/uL (ref 0.0–0.1)
Basophils Relative: 0 % (ref 0–1)
Eosinophils Absolute: 0.1 10*3/uL (ref 0.0–0.7)
Eosinophils Relative: 1 % (ref 0–5)
HCT: 38.5 % (ref 36.0–46.0)
Hemoglobin: 12.5 g/dL (ref 12.0–15.0)
Lymphocytes Relative: 56 % — ABNORMAL HIGH (ref 12–46)
Lymphs Abs: 2.9 10*3/uL (ref 0.7–4.0)
MCH: 28.7 pg (ref 26.0–34.0)
MCHC: 32.5 g/dL (ref 30.0–36.0)
MCV: 88.5 fL (ref 78.0–100.0)
Monocytes Absolute: 0.5 10*3/uL (ref 0.1–1.0)
Monocytes Relative: 10 % (ref 3–12)
Neutro Abs: 1.7 10*3/uL (ref 1.7–7.7)
Neutrophils Relative %: 33 % — ABNORMAL LOW (ref 43–77)
Platelets: 184 10*3/uL (ref 150–400)
RBC: 4.35 MIL/uL (ref 3.87–5.11)
RDW: 15 % (ref 11.5–15.5)
WBC: 5.2 10*3/uL (ref 4.0–10.5)

## 2013-07-04 LAB — URINALYSIS, ROUTINE W REFLEX MICROSCOPIC
Bilirubin Urine: NEGATIVE
Glucose, UA: NEGATIVE mg/dL
Hgb urine dipstick: NEGATIVE
Ketones, ur: NEGATIVE mg/dL
Leukocytes, UA: NEGATIVE
Nitrite: NEGATIVE
Protein, ur: NEGATIVE mg/dL
Specific Gravity, Urine: 1.019 (ref 1.005–1.030)
Urobilinogen, UA: 1 mg/dL (ref 0.0–1.0)
pH: 7 (ref 5.0–8.0)

## 2013-07-04 LAB — GLUCOSE, CAPILLARY: Glucose-Capillary: 97 mg/dL (ref 70–99)

## 2013-07-04 MED ORDER — IOHEXOL 300 MG/ML  SOLN
20.0000 mL | INTRAMUSCULAR | Status: AC
  Administered 2013-07-04: 25 mL via ORAL

## 2013-07-04 MED ORDER — HYDROMORPHONE HCL PF 1 MG/ML IJ SOLN
0.5000 mg | Freq: Once | INTRAMUSCULAR | Status: AC
  Administered 2013-07-04: 0.5 mg via INTRAMUSCULAR
  Filled 2013-07-04: qty 1

## 2013-07-04 NOTE — ED Notes (Addendum)
Pt reports left sided abd pain, back pain and leg pain that started yesterday. Reports that the pain sis worse with movement. Denies any n/v with the pain. Denies any urinary burning at this time, but reports that she feels full like she has to urinate. Pt reports that she has had kidney stones in the past

## 2013-07-04 NOTE — ED Provider Notes (Signed)
CSN: [id number]     Arrival date & time 07/04/13  1646 History   First MD Initiated Contact with Patient 07/04/13 1705     Chief Complaint  Patient presents with  . Abdominal Pain  . Back Pain  . Leg Pain   (Consider location/radiation/quality/duration/timing/severity/associated sxs/prior Treatment) Patient is a 62 y.o. female presenting with abdominal pain.  Abdominal Pain Pain location:  Periumbilical Pain quality: squeezing   Pain radiates to:  Does not radiate Pain severity:  No pain Onset quality:  Gradual Duration:  4 days Timing:  Constant Progression:  Waxing and waning Chronicity:  Chronic Context: previous surgery (multiple abd surgeries)   Relieved by:  Nothing Worsened by:  Nothing tried Associated symptoms: anorexia   Associated symptoms: no chest pain, no chills, no constipation, no cough, no diarrhea, no dysuria, no fever, no nausea, no shortness of breath, no sore throat, no vaginal bleeding, no vaginal discharge and no vomiting     Past Medical History  Diagnosis Date  . Hx of CABG     2007  . Hypertension   . Diabetes mellitus   . Ejection fraction     EF 65%, echo, [place], Jan 16, 2011  . Sleep apnea     CPAP being arranged May, 2011  . Dyslipidemia   . CAD (coronary artery disease)     Nuclear stress test Feb 11, 2011, EF 64%, no scar or ischemia    //         catheterization, November, 2011,patent LIMA-LAD-small caliber distal vessel with diffuse plaque, patent SVG to OM1 and OM 2, patent SVG to PDA and PLA, occluded SVG to diagonal, medical therapy;  Lexiscan Myoview 6/14:  Inferior thinning, no ischemia, EF 61%, low risk  . Overweight(278.02)     stomach stapling 1985 followed by weight loss, then return of weight  . Tobacco abuse     in the past, resolved  . Alcohol abuse     in the past, resolved  . Nausea & vomiting     hospitalization, November, 2011, stricture GE junction, questionable stenosis gastrojejunostomy, disruptive primary  peristaltic wave, GE reflux, delayed emptying proximal gastric pouch  . Hx of colonic polyps   . Asthma   . Myocardial infarction   . Breast cancer     Double mastectomy 2007; s/p tamoxifen and arimidex therapy; no recurrence since 05/2008   Past Surgical History  Procedure Laterality Date  . Coronary artery bypass graft    . Mastectomy    . Coronary stent placement    . Abdominal hysterectomy     Family History  Problem Relation Age of Onset  . Hypertension Mother   . Heart disease Mother   . Diabetes Mother   . Heart disease Father   . Heart disease Sister   . Stomach cancer Sister   . Asthma Sister   . Colon cancer Father    History  Substance Use Topics  . Smoking status: Former Smoker -- 3.00 packs/day for 27 years    Types: Cigarettes    Quit date: 09/14/2000  . Smokeless tobacco: Never Used  . Alcohol Use: No     Comment: History of alcohol abuse but quit 5 years ago   OB History   Grav Para Term Preterm Abortions TAB SAB Ect Mult Living                 Review of Systems  Constitutional: Negative for fever and chills.  HENT: Negative for congestion,  rhinorrhea and sore throat.   Eyes: Negative for photophobia and visual disturbance.  Respiratory: Negative for cough and shortness of breath.   Cardiovascular: Negative for chest pain and leg swelling.  Gastrointestinal: Positive for abdominal pain and anorexia. Negative for nausea, vomiting, diarrhea and constipation.  Endocrine: Negative for polyphagia and polyuria.  Genitourinary: Negative for dysuria, flank pain, vaginal bleeding, vaginal discharge and enuresis.  Musculoskeletal: Negative for back pain and gait problem.  Skin: Negative for color change and rash.  Neurological: Negative for dizziness, syncope, light-headedness and numbness.  Hematological: Negative for adenopathy. Does not bruise/bleed easily.  All other systems reviewed and are negative.    Allergies  Review of patient's allergies  indicates no known allergies.  Home Medications   Current Outpatient Rx  Name  Route  Sig  Dispense  Refill  . acetaminophen (TYLENOL) 325 MG tablet   Oral   Take 2 tablets (650 mg total) by mouth every 6 (six) hours as needed for pain.         [name] Kitchen albuterol (PROVENTIL HFA;VENTOLIN HFA) 108 (90 BASE) MCG/ACT inhaler   Inhalation   Inhale 2 puffs into the lungs every 6 (six) hours as needed. For shortness of breath         . aspirin EC 81 MG tablet   Oral   Take 81 mg by mouth every other day.          [name] Kitchen HYDROcodone-acetaminophen (NORCO) 5-325 MG per tablet   Oral   Take 1 tablet by mouth every 6 (six) hours as needed for pain.   20 tablet   0   . lisinopril-hydrochlorothiazide (PRINZIDE,ZESTORETIC) 20-12.5 MG per tablet   Oral   Take 1 tablet by mouth daily.           . nitroGLYCERIN (NITROSTAT) 0.4 MG SL tablet   Sublingual   Place 1 tablet (0.4 mg total) under the tongue every 5 (five) minutes x 3 doses as needed for chest pain.   25 tablet   3   . ondansetron (ZOFRAN) 8 MG tablet   Oral   Take 1 tablet (8 mg total) by mouth every 8 (eight) hours as needed for nausea.   12 tablet   0   . RABEprazole (ACIPHEX) 20 MG tablet   Oral   Take 1 tablet (20 mg total) by mouth daily.   60 tablet   0   . simvastatin (ZOCOR) 20 MG tablet   Oral   Take 20 mg by mouth at bedtime.          . sucralfate (CARAFATE) 1 GM/10ML suspension   Oral   Take 1 g by mouth every morning.           BP 132/78  Pulse 73  Temp(Src) 98 F (36.7 C) (Oral)  Resp 18  Wt 215 lb (97.523 kg)  BMI 43.4 kg/m2  SpO2 99% Physical Exam  Vitals reviewed. Constitutional: She is oriented to person, place, and time. She appears well-developed and well-nourished.  HENT:  Head: Normocephalic and atraumatic.  Right Ear: External ear normal.  Left Ear: External ear normal.  Eyes: Conjunctivae and EOM are normal. Pupils are equal, round, and reactive to light.  Neck: Normal range of  motion. Neck supple.  Cardiovascular: Normal rate, regular rhythm, normal heart sounds and intact distal pulses.   Pulmonary/Chest: Effort normal and breath sounds normal.  Abdominal: Soft. Bowel sounds are normal. There is tenderness in the periumbilical area and left lower quadrant.  Musculoskeletal: Normal range of motion.  Neurological: She is alert and oriented to person, place, and time.  Skin: Skin is warm and dry.    ED Course  Procedures (including critical care time) Labs Review Labs Reviewed  CBC WITH DIFFERENTIAL - Abnormal; Notable for the following:    Neutrophils Relative % 33 (*)    Lymphocytes Relative 56 (*)    All other components within normal limits  COMPREHENSIVE METABOLIC PANEL - Abnormal; Notable for the following:    Glucose, Bld 145 (*)    Albumin 3.4 (*)    Total Bilirubin 0.2 (*)    GFR calc non Af Amer 88 (*)    All other components within normal limits  URINALYSIS, ROUTINE W REFLEX MICROSCOPIC  GLUCOSE, CAPILLARY   Imaging Review Ct Abdomen Pelvis Wo Contrast  07/04/2013   CLINICAL DATA:  Abdominal pain.  EXAM: CT ABDOMEN AND PELVIS WITHOUT CONTRAST  TECHNIQUE: Multidetector CT imaging of the abdomen and pelvis was performed following the standard protocol without intravenous contrast.  COMPARISON:  CT scan of May 21, 2011.  FINDINGS: Visualized lung bases appear normal. The liver, spleen and pancreas appear normal. No gallstones are noted. Adrenal glands and kidneys appear normal. No evidence of bowel obstruction is noted. The appendix appears normal. Stool is noted in the right and transverse colon. No abnormal fluid collection is noted. Urinary bladder appears normal. No hydronephrosis or renal obstruction is noted. Atherosclerotic calcifications of abdominal aorta are noted.  IMPRESSION: No acute abnormality seen in the abdomen or pelvis.   Electronically Signed   By: Roque Lias M.D.   On: 07/04/2013 21:43    EKG Interpretation   None        MDM   1. Abdominal pain   2. Urinary frequency    62 y.o. female  with pertinent PMH of chronic abdominal pain, CAD sp CABG, DM presents with abd pain beginnning 4 days ago.  Pt has ho abd hysterectomy.  She denies change in BM, states pain is intermittent, all throughout abd.  Physical exam as above with periumbilical and LLQ tenderness.  Labs as above demonstrated no acute pathology.  CT scan obtained which as above was unremarkable. Patient further endorsed a history of chronic abdominal pain and states that the pain today is similar to that that is in the past. She specifically denies fever, nausea, vomiting or other emergent pathology and has had no change in her bowel movements.  Labs and imaging as above reviewed by myself and attending,Dr. Juleen China, with whom case was discussed.   1. Abdominal pain   2. Urinary frequency         Noel Gerold, MD 07/05/13 0045

## 2013-07-04 NOTE — ED Notes (Signed)
CT Lawson Fiscal notified that pt finished drinking contrast.

## 2013-07-04 NOTE — ED Notes (Signed)
MD at bedside. 

## 2013-07-06 NOTE — ED Provider Notes (Signed)
I saw and evaluated the patient, reviewed the resident's note and I agree with the findings and plan.  EKG Interpretation   None      62 year old female with abdominal pain. He has been pretty unremarkable. Possibly exacerbation of chronic abdominal pain. No peritoneal signs. Hemodynamically stable. Symptomatic treatment at this time. Return precautions were discussed. Outpatient followup.  Raeford Razor, MD 07/06/13 1432

## 2013-10-10 ENCOUNTER — Encounter: Payer: Self-pay | Admitting: Gastroenterology

## 2013-10-28 ENCOUNTER — Encounter (HOSPITAL_COMMUNITY): Payer: Self-pay | Admitting: Emergency Medicine

## 2013-10-28 ENCOUNTER — Emergency Department (HOSPITAL_COMMUNITY): Payer: Medicaid Other

## 2013-10-28 ENCOUNTER — Emergency Department (HOSPITAL_COMMUNITY)
Admission: EM | Admit: 2013-10-28 | Discharge: 2013-10-28 | Disposition: A | Discharge status: Home / Self Care | Payer: Medicaid Other | Attending: Emergency Medicine | Admitting: Emergency Medicine

## 2013-10-28 DIAGNOSIS — Z79899 Other long term (current) drug therapy: Secondary | ICD-10-CM | POA: Insufficient documentation

## 2013-10-28 DIAGNOSIS — G8929 Other chronic pain: Secondary | ICD-10-CM | POA: Insufficient documentation

## 2013-10-28 DIAGNOSIS — I2510 Atherosclerotic heart disease of native coronary artery without angina pectoris: Secondary | ICD-10-CM | POA: Insufficient documentation

## 2013-10-28 DIAGNOSIS — I10 Essential (primary) hypertension: Secondary | ICD-10-CM | POA: Insufficient documentation

## 2013-10-28 DIAGNOSIS — Z860100 Personal history of colon polyps, unspecified: Secondary | ICD-10-CM | POA: Insufficient documentation

## 2013-10-28 DIAGNOSIS — M1711 Unilateral primary osteoarthritis, right knee: Secondary | ICD-10-CM

## 2013-10-28 DIAGNOSIS — M1710 Unilateral primary osteoarthritis, unspecified knee: Secondary | ICD-10-CM | POA: Insufficient documentation

## 2013-10-28 DIAGNOSIS — Z951 Presence of aortocoronary bypass graft: Secondary | ICD-10-CM | POA: Insufficient documentation

## 2013-10-28 DIAGNOSIS — Z87891 Personal history of nicotine dependence: Secondary | ICD-10-CM | POA: Insufficient documentation

## 2013-10-28 DIAGNOSIS — Z8679 Personal history of other diseases of the circulatory system: Secondary | ICD-10-CM | POA: Insufficient documentation

## 2013-10-28 DIAGNOSIS — R0989 Other specified symptoms and signs involving the circulatory and respiratory systems: Secondary | ICD-10-CM | POA: Insufficient documentation

## 2013-10-28 DIAGNOSIS — E785 Hyperlipidemia, unspecified: Secondary | ICD-10-CM | POA: Insufficient documentation

## 2013-10-28 DIAGNOSIS — Z8719 Personal history of other diseases of the digestive system: Secondary | ICD-10-CM | POA: Insufficient documentation

## 2013-10-28 DIAGNOSIS — J45909 Unspecified asthma, uncomplicated: Secondary | ICD-10-CM | POA: Insufficient documentation

## 2013-10-28 DIAGNOSIS — Z853 Personal history of malignant neoplasm of breast: Secondary | ICD-10-CM | POA: Insufficient documentation

## 2013-10-28 DIAGNOSIS — IMO0002 Reserved for concepts with insufficient information to code with codable children: Secondary | ICD-10-CM | POA: Insufficient documentation

## 2013-10-28 DIAGNOSIS — G4730 Sleep apnea, unspecified: Secondary | ICD-10-CM | POA: Insufficient documentation

## 2013-10-28 DIAGNOSIS — E119 Type 2 diabetes mellitus without complications: Secondary | ICD-10-CM | POA: Insufficient documentation

## 2013-10-28 DIAGNOSIS — F1021 Alcohol dependence, in remission: Secondary | ICD-10-CM | POA: Insufficient documentation

## 2013-10-28 DIAGNOSIS — Z7982 Long term (current) use of aspirin: Secondary | ICD-10-CM | POA: Insufficient documentation

## 2013-10-28 MED ORDER — HYDROCODONE-ACETAMINOPHEN 5-325 MG PO TABS
1.0000 | ORAL_TABLET | Freq: Once | ORAL | Status: AC
  Administered 2013-10-28: 1 via ORAL
  Filled 2013-10-28: qty 1

## 2013-10-28 MED ORDER — HYDROCODONE-ACETAMINOPHEN 5-325 MG PO TABS: 1.0000 | ORAL_TABLET | ORAL | Status: DC | PRN

## 2013-10-28 NOTE — Discharge Instructions (Signed)
Arthritis, Nonspecific °Arthritis is inflammation of a joint. This usually means pain, redness, warmth or swelling are present. One or more joints may be involved. There are a number of types of arthritis. Your caregiver may not be able to tell what type of arthritis you have right away. °CAUSES  °The most common cause of arthritis is the wear and tear on the joint (osteoarthritis). This causes damage to the cartilage, which can break down over time. The knees, hips, back and neck are most often affected by this type of arthritis. °Other types of arthritis and common causes of joint pain include: °· Sprains and other injuries near the joint. Sometimes minor sprains and injuries cause pain and swelling that develop hours later. °· Rheumatoid arthritis. This affects hands, feet and knees. It usually affects both sides of your body at the same time. It is often associated with chronic ailments, fever, weight loss and general weakness. °· Crystal arthritis. Gout and pseudo gout can cause occasional acute severe pain, redness and swelling in the foot, ankle, or knee. °· Infectious arthritis. Bacteria can get into a joint through a break in overlying skin. This can cause infection of the joint. Bacteria and viruses can also spread through the blood and affect your joints. °· Drug, infectious and allergy reactions. Sometimes joints can become mildly painful and slightly swollen with these types of illnesses. °SYMPTOMS  °· Pain is the main symptom. °· Your joint or joints can also be red, swollen and warm or hot to the touch. °· You may have a fever with certain types of arthritis, or even feel overall ill. °· The joint with arthritis will hurt with movement. Stiffness is present with some types of arthritis. °DIAGNOSIS  °Your caregiver will suspect arthritis based on your description of your symptoms and on your exam. Testing may be needed to find the type of arthritis: °· Blood and sometimes urine tests. °· X-ray tests  and sometimes CT or MRI scans. °· Removal of fluid from the joint (arthrocentesis) is done to check for bacteria, crystals or other causes. Your caregiver (or a specialist) will numb the area over the joint with a local anesthetic, and use a needle to remove joint fluid for examination. This procedure is only minimally uncomfortable. °· Even with these tests, your caregiver may not be able to tell what kind of arthritis you have. Consultation with a specialist (rheumatologist) may be helpful. °TREATMENT  °Your caregiver will discuss with you treatment specific to your type of arthritis. If the specific type cannot be determined, then the following general recommendations may apply. °Treatment of severe joint pain includes: °· Rest. °· Elevation. °· Anti-inflammatory medication (for example, ibuprofen) may be prescribed. Avoiding activities that cause increased pain. °· Only take over-the-counter or prescription medicines for pain and discomfort as recommended by your caregiver. °· Cold packs over an inflamed joint may be used for 10 to 15 minutes every hour. Hot packs sometimes feel better, but do not use overnight. Do not use hot packs if you are diabetic without your caregiver's permission. °· A cortisone shot into arthritic joints may help reduce pain and swelling. °· Any acute arthritis that gets worse over the next 1 to 2 days needs to be looked at to be sure there is no joint infection. °Long-term arthritis treatment involves modifying activities and lifestyle to reduce joint stress jarring. This can include weight loss. Also, exercise is needed to nourish the joint cartilage and remove waste. This helps keep the muscles   around the joint strong. HOME CARE INSTRUCTIONS   Do not take aspirin to relieve pain if gout is suspected. This elevates uric acid levels.  Only take over-the-counter or prescription medicines for pain, discomfort or fever as directed by your caregiver.  Rest the joint as much as  possible.  If your joint is swollen, keep it elevated.  Use crutches if the painful joint is in your leg.  Drinking plenty of fluids may help for certain types of arthritis.  Follow your caregiver's dietary instructions.  Try low-impact exercise such as:  Swimming.  Water aerobics.  Biking.  Walking.  Morning stiffness is often relieved by a warm shower.  Put your joints through regular range-of-motion. SEEK MEDICAL CARE IF:   You do not feel better in 24 hours or are getting worse.  You have side effects to medications, or are not getting better with treatment. SEEK IMMEDIATE MEDICAL CARE IF:   You have a fever.  You develop severe joint pain, swelling or redness.  Many joints are involved and become painful and swollen.  There is severe back pain and/or leg weakness.  You have loss of bowel or bladder control. Document Released: 10/08/2004 Document Revised: 11/23/2011 Document Reviewed: 10/24/2008 [facility] Patient Information 2014 [place].  Wear and Tear Disorders of the Knee (Arthritis, Osteoarthritis) Everyone will experience wear and tear injuries (arthritis, osteoarthritis) of the knee. These are the changes we all get as we age. They come from the joint stress of daily living. The amount of cartilage damage in your knee and your symptoms determine if you need surgery. Mild problems require approximately two months recovery time. More severe problems take several months to recover. With mild problems, your surgeon may find worn and rough cartilage surfaces. With severe changes, your surgeon may find cartilage that has completely worn away and exposed the bone. Loose bodies of bone and cartilage, bone spurs (excess bone growth), and injuries to the menisci (cushions between the large bones of your leg) are also common. All of these problems can cause pain. For a mild wear and tear problem, rough cartilage may simply need to be shaved and smoothed. For more  severe problems with areas of exposed bone, your surgeon may use an instrument for roughing up the bone surfaces to stimulate new cartilage growth. Loose bodies are usually removed. Torn menisci may be trimmed or repaired. ABOUT THE ARTHROSCOPIC PROCEDURE Arthroscopy is a surgical technique. It allows your orthopedic surgeon to diagnose and treat your knee injury with accuracy. The surgeon looks into your knee through a small scope. The scope is like a small (pencil-sized) telescope. Arthroscopy is less invasive than open knee surgery. You can expect a more rapid recovery. After the procedure, you will be moved to a recovery area until most of the effects of the medication have worn off. Your caregiver will discuss the test results with you. RECOVERY The severity of the arthritis and the type of procedure performed will determine recovery time. Other important factors include age, physical condition, medical conditions, and the type of rehabilitation program. Strengthening your muscles after arthroscopy helps guarantee a better recovery. Follow your caregiver's instructions. Use crutches, rest, elevate, ice, and do knee exercises as instructed. Your caregivers will help you and instruct you with exercises and other physical therapy required to regain your mobility, muscle strength, and functioning following surgery. Only take over-the-counter or prescription medicines for pain, discomfort, or fever as directed by your caregiver.  SEEK MEDICAL CARE IF:   There  is increased bleeding (more than a small spot) from the wound.  You notice redness, swelling, or increasing pain in the wound.  Pus is coming from wound.  You develop an unexplained oral temperature above 102 F (38.9 C) , or as your caregiver suggests.  You notice a foul smell coming from the wound or dressing.  You have severe pain with motion of the knee. SEEK IMMEDIATE MEDICAL CARE IF:   You develop a rash.  You have difficulty  breathing.  You have any allergic problems. MAKE SURE YOU:   Understand these instructions.  Will watch your condition.  Will get help right away if you are not doing well or get worse. Document Released: 08/28/2000 Document Revised: 11/23/2011 Document Reviewed: 01/25/2008 [facility] Patient Information 2014 [place], [place].

## 2013-10-28 NOTE — ED Provider Notes (Signed)
CSN: [id number]     Arrival date & time 10/28/13  0607 History   First MD Initiated Contact with Patient 10/28/13 [phone]     Chief Complaint  Patient presents with  . Knee Pain     (Consider location/radiation/quality/duration/timing/severity/associated sxs/prior Treatment) HPI Comments: Patient is 63 year old female with HTN, CAD and DM who presents to the ED with worsening of her right knee pain.  She states that she has arthritis in the right knee, and starting yesterday the pain became worse.  She reports that she has not seen an orthopedist for this, reports pain with ambulation and flexion of the knee.  She states that the knee feels more swollen than usual.  She denies weakness, numbness or tingling, fever, chills, redness of the skin.  No previous surgeries to the knee.  Patient is a 63 y.o. female presenting with knee pain. The history is provided by the patient. No language interpreter was used.  Knee Pain Location:  Knee Time since incident:  24 hours Injury: no   Knee location:  R knee Pain details:    Quality:  Shooting and sharp   Radiates to:  Does not radiate   Severity:  Moderate   Onset quality:  Gradual   Duration:  24 hours   Timing:  Constant   Progression:  Worsening Chronicity:  Chronic Dislocation: no   Foreign body present:  No foreign bodies Tetanus status:  Up to date Prior injury to area:  No Relieved by:  Nothing Worsened by:  Activity, bearing weight and flexion Ineffective treatments:  None tried Associated symptoms: decreased ROM, stiffness and swelling   Associated symptoms: no fatigue, no fever, no muscle weakness, no numbness and no tingling     Past Medical History  Diagnosis Date  . Hx of CABG     2007  . Hypertension   . Diabetes mellitus   . Ejection fraction     EF 65%, echo, [place], Jan 16, 2011  . Sleep apnea     CPAP being arranged May, 2011  . Dyslipidemia   . CAD (coronary artery disease)     Nuclear stress test Feb 11, 2011, EF 64%, no scar or ischemia    //         catheterization, November, 2011,patent LIMA-LAD-small caliber distal vessel with diffuse plaque, patent SVG to OM1 and OM 2, patent SVG to PDA and PLA, occluded SVG to diagonal, medical therapy;  Lexiscan Myoview 6/14:  Inferior thinning, no ischemia, EF 61%, low risk  . Overweight     stomach stapling 1985 followed by weight loss, then return of weight  . Tobacco abuse     in the past, resolved  . Alcohol abuse     in the past, resolved  . Nausea & vomiting     hospitalization, November, 2011, stricture GE junction, questionable stenosis gastrojejunostomy, disruptive primary peristaltic wave, GE reflux, delayed emptying proximal gastric pouch  . Hx of colonic polyps   . Asthma   . Myocardial infarction   . Breast cancer     Double mastectomy 2007; s/p tamoxifen and arimidex therapy; no recurrence since 05/2008   Past Surgical History  Procedure Laterality Date  . Coronary artery bypass graft    . Mastectomy    . Coronary stent placement    . Abdominal hysterectomy     Family History  Problem Relation Age of Onset  . Hypertension Mother   . Heart disease Mother   .  Diabetes Mother   . Heart disease Father   . Heart disease Sister   . Stomach cancer Sister   . Asthma Sister   . Colon cancer Father    History  Substance Use Topics  . Smoking status: Former Smoker -- 3.00 packs/day for 27 years    Types: Cigarettes    Quit date: 09/14/2000  . Smokeless tobacco: Never Used  . Alcohol Use: No     Comment: History of alcohol abuse but quit 5 years ago   OB History   Grav Para Term Preterm Abortions TAB SAB Ect Mult Living                 Review of Systems  Constitutional: Negative for fever and fatigue.  Musculoskeletal: Positive for stiffness.  All other systems reviewed and are negative.      Allergies  Review of patient's allergies indicates no known allergies.  Home Medications   Current Outpatient Rx  Name   Route  Sig  Dispense  Refill  . acetaminophen (TYLENOL) 325 MG tablet   Oral   Take 2 tablets (650 mg total) by mouth every 6 (six) hours as needed for pain.         [name] Kitchen albuterol (PROVENTIL HFA;VENTOLIN HFA) 108 (90 BASE) MCG/ACT inhaler   Inhalation   Inhale 2 puffs into the lungs every 6 (six) hours as needed. For shortness of breath         . aspirin EC 81 MG tablet   Oral   Take 81 mg by mouth every other day.          [name] Kitchen HYDROcodone-acetaminophen (NORCO) 5-325 MG per tablet   Oral   Take 1 tablet by mouth every 6 (six) hours as needed for pain.   20 tablet   0   . lisinopril-hydrochlorothiazide (PRINZIDE,ZESTORETIC) 20-12.5 MG per tablet   Oral   Take 1 tablet by mouth daily.           . nitroGLYCERIN (NITROSTAT) 0.4 MG SL tablet   Sublingual   Place 1 tablet (0.4 mg total) under the tongue every 5 (five) minutes x 3 doses as needed for chest pain.   25 tablet   3   . ondansetron (ZOFRAN) 8 MG tablet   Oral   Take 1 tablet (8 mg total) by mouth every 8 (eight) hours as needed for nausea.   12 tablet   0   . RABEprazole (ACIPHEX) 20 MG tablet   Oral   Take 1 tablet (20 mg total) by mouth daily.   60 tablet   0   . simvastatin (ZOCOR) 20 MG tablet   Oral   Take 20 mg by mouth at bedtime.          . sucralfate (CARAFATE) 1 GM/10ML suspension   Oral   Take 1 g by mouth every morning.           BP 128/68  Pulse 77  Temp(Src) 98 F (36.7 C) (Oral)  Resp 18  SpO2 98% Physical Exam  Nursing note and vitals reviewed. Constitutional: She is oriented to person, place, and time. She appears well-developed and well-nourished. No distress.  HENT:  Head: Normocephalic and atraumatic.  Mouth/Throat: Oropharynx is clear and moist.  Eyes: Conjunctivae are normal. No scleral icterus.  Pulmonary/Chest: Effort normal.  Musculoskeletal:       Right knee: She exhibits decreased range of motion and swelling. She exhibits no effusion, no deformity, no erythema,  normal alignment,  no LCL laxity and no MCL laxity. Tenderness found. Medial joint line and lateral joint line tenderness noted.       Left knee: She exhibits normal range of motion, no swelling, no erythema, normal alignment, no LCL laxity and no MCL laxity. No tenderness found. No medial joint line and no lateral joint line tenderness noted.       Legs: Neurological: She is alert and oriented to person, place, and time. She exhibits normal muscle tone. Coordination normal.  Skin: Skin is warm and dry. No rash noted. No erythema. No pallor.  Psychiatric: She has a normal mood and affect. Her behavior is normal. Judgment and thought content normal.    ED Course  Procedures (including critical care time) Labs Review Labs Reviewed - No data to display Imaging Review No results found.  EKG Interpretation   None      Results for orders placed during the hospital encounter of 07/04/13  CBC WITH DIFFERENTIAL      Result Value Ref Range   WBC 5.2  4.0 - 10.5 K/uL   RBC 4.35  3.87 - 5.11 MIL/uL   Hemoglobin 12.5  12.0 - 15.0 g/dL   HCT 38.5  36.0 - 46.0 %   MCV 88.5  78.0 - 100.0 fL   MCH 28.7  26.0 - 34.0 pg   MCHC 32.5  30.0 - 36.0 g/dL   RDW 15.0  11.5 - 15.5 %   Platelets 184  150 - 400 K/uL   Neutrophils Relative % 33 (*) 43 - 77 %   Neutro Abs 1.7  1.7 - 7.7 K/uL   Lymphocytes Relative 56 (*) 12 - 46 %   Lymphs Abs 2.9  0.7 - 4.0 K/uL   Monocytes Relative 10  3 - 12 %   Monocytes Absolute 0.5  0.1 - 1.0 K/uL   Eosinophils Relative 1  0 - 5 %   Eosinophils Absolute 0.1  0.0 - 0.7 K/uL   Basophils Relative 0  0 - 1 %   Basophils Absolute 0.0  0.0 - 0.1 K/uL  COMPREHENSIVE METABOLIC PANEL      Result Value Ref Range   Sodium 139  135 - 145 mEq/L   Potassium 4.0  3.5 - 5.1 mEq/L   Chloride 105  96 - 112 mEq/L   CO2 24  19 - 32 mEq/L   Glucose, Bld 145 (*) 70 - 99 mg/dL   BUN 10  6 - 23 mg/dL   Creatinine, Ser 0.75  0.50 - 1.10 mg/dL   Calcium 9.2  8.4 - 10.5 mg/dL   Total  Protein 6.6  6.0 - 8.3 g/dL   Albumin 3.4 (*) 3.5 - 5.2 g/dL   AST 20  0 - 37 U/L   ALT 17  0 - 35 U/L   Alkaline Phosphatase 82  39 - 117 U/L   Total Bilirubin 0.2 (*) 0.3 - 1.2 mg/dL   GFR calc non Af Amer 88 (*) >90 mL/min   GFR calc Af Amer >90  >90 mL/min  URINALYSIS, ROUTINE W REFLEX MICROSCOPIC      Result Value Ref Range   Color, Urine YELLOW  YELLOW   APPearance CLEAR  CLEAR   Specific Gravity, Urine 1.019  1.005 - 1.030   pH 7.0  5.0 - 8.0   Glucose, UA NEGATIVE  NEGATIVE mg/dL   Hgb urine dipstick NEGATIVE  NEGATIVE   Bilirubin Urine NEGATIVE  NEGATIVE   Ketones, ur NEGATIVE  NEGATIVE mg/dL  Protein, ur NEGATIVE  NEGATIVE mg/dL   Urobilinogen, UA 1.0  0.0 - 1.0 mg/dL   Nitrite NEGATIVE  NEGATIVE   Leukocytes, UA NEGATIVE  NEGATIVE  GLUCOSE, CAPILLARY      Result Value Ref Range   Glucose-Capillary 97  70 - 99 mg/dL   Dg Knee Complete 4 Views Right  10/28/2013   CLINICAL DATA:  Knee pain.  EXAM: RIGHT KNEE - COMPLETE 4+ VIEW  COMPARISON:  None.  FINDINGS: Multiple surgical clips are seen medial to the knee and proximal tibia. There is mild medial compartment joint space narrowing. There is moderate medial greater than lateral compartment osteophytosis. No knee joint effusion is seen. There is mild to moderate patellofemoral spurring. No acute fracture or dislocation is identified. Vascular calcifications are noted.  IMPRESSION: Mild to moderate tricompartmental osteoarthrosis, with greatest involvement medially.   Electronically Signed   By: Logan Bores   On: 10/28/2013 07:18       MDM   Osteoarthritis of the right knee  Patient with known history of OA of the knee who has not been seen by ortho in the past presents with worsening of the pain.  The patient has no risk factors for septic arthritis, I also do not suspect gout.  Because of the patient's DM, I will not be giving her a steroid.  She will be referred to orthopedics for further management of  this.   Idalia Needle Joelyn Oms, [place] 10/28/13 [phone]

## 2013-10-28 NOTE — ED Notes (Signed)
Pt states that she has been having increased pain in her right knee. States that she has arthritis and chronic pain in that knee. States that she has had cortisone shots in the knee before. States that she did not take anything for the pain at home.

## 2013-10-29 NOTE — ED Provider Notes (Signed)
Medical screening examination/treatment/procedure(s) were performed by non-physician practitioner and as supervising physician I was immediately available for consultation/collaboration.  EKG Interpretation   None         Julianne Rice, MD 10/29/13 [phone]

## 2013-11-07 ENCOUNTER — Encounter (HOSPITAL_COMMUNITY): Payer: Self-pay | Admitting: Emergency Medicine

## 2013-11-07 ENCOUNTER — Emergency Department (HOSPITAL_COMMUNITY): Payer: No Typology Code available for payment source

## 2013-11-07 ENCOUNTER — Emergency Department (HOSPITAL_COMMUNITY)
Admission: EM | Admit: 2013-11-07 | Discharge: 2013-11-07 | Disposition: A | Discharge status: Home / Self Care | Payer: No Typology Code available for payment source | Attending: Emergency Medicine | Admitting: Emergency Medicine

## 2013-11-07 DIAGNOSIS — Z7982 Long term (current) use of aspirin: Secondary | ICD-10-CM | POA: Diagnosis not present

## 2013-11-07 DIAGNOSIS — S46909A Unspecified injury of unspecified muscle, fascia and tendon at shoulder and upper arm level, unspecified arm, initial encounter: Secondary | ICD-10-CM | POA: Insufficient documentation

## 2013-11-07 DIAGNOSIS — Z87891 Personal history of nicotine dependence: Secondary | ICD-10-CM | POA: Insufficient documentation

## 2013-11-07 DIAGNOSIS — Z9981 Dependence on supplemental oxygen: Secondary | ICD-10-CM | POA: Diagnosis not present

## 2013-11-07 DIAGNOSIS — IMO0002 Reserved for concepts with insufficient information to code with codable children: Secondary | ICD-10-CM | POA: Insufficient documentation

## 2013-11-07 DIAGNOSIS — M25519 Pain in unspecified shoulder: Secondary | ICD-10-CM

## 2013-11-07 DIAGNOSIS — Z9861 Coronary angioplasty status: Secondary | ICD-10-CM | POA: Insufficient documentation

## 2013-11-07 DIAGNOSIS — Y9389 Activity, other specified: Secondary | ICD-10-CM | POA: Diagnosis not present

## 2013-11-07 DIAGNOSIS — I10 Essential (primary) hypertension: Secondary | ICD-10-CM | POA: Insufficient documentation

## 2013-11-07 DIAGNOSIS — Y92410 Unspecified street and highway as the place of occurrence of the external cause: Secondary | ICD-10-CM | POA: Diagnosis not present

## 2013-11-07 DIAGNOSIS — Z8669 Personal history of other diseases of the nervous system and sense organs: Secondary | ICD-10-CM | POA: Insufficient documentation

## 2013-11-07 DIAGNOSIS — I2510 Atherosclerotic heart disease of native coronary artery without angina pectoris: Secondary | ICD-10-CM | POA: Diagnosis not present

## 2013-11-07 DIAGNOSIS — I252 Old myocardial infarction: Secondary | ICD-10-CM | POA: Diagnosis not present

## 2013-11-07 DIAGNOSIS — S4980XA Other specified injuries of shoulder and upper arm, unspecified arm, initial encounter: Secondary | ICD-10-CM | POA: Diagnosis not present

## 2013-11-07 DIAGNOSIS — E663 Overweight: Secondary | ICD-10-CM | POA: Diagnosis not present

## 2013-11-07 DIAGNOSIS — Z860100 Personal history of colon polyps, unspecified: Secondary | ICD-10-CM | POA: Insufficient documentation

## 2013-11-07 DIAGNOSIS — E119 Type 2 diabetes mellitus without complications: Secondary | ICD-10-CM | POA: Diagnosis not present

## 2013-11-07 DIAGNOSIS — Z951 Presence of aortocoronary bypass graft: Secondary | ICD-10-CM | POA: Diagnosis not present

## 2013-11-07 DIAGNOSIS — S99919A Unspecified injury of unspecified ankle, initial encounter: Secondary | ICD-10-CM

## 2013-11-07 DIAGNOSIS — J45909 Unspecified asthma, uncomplicated: Secondary | ICD-10-CM | POA: Insufficient documentation

## 2013-11-07 DIAGNOSIS — S99929A Unspecified injury of unspecified foot, initial encounter: Secondary | ICD-10-CM

## 2013-11-07 DIAGNOSIS — S0990XA Unspecified injury of head, initial encounter: Secondary | ICD-10-CM | POA: Diagnosis not present

## 2013-11-07 DIAGNOSIS — M25569 Pain in unspecified knee: Secondary | ICD-10-CM

## 2013-11-07 DIAGNOSIS — S8990XA Unspecified injury of unspecified lower leg, initial encounter: Secondary | ICD-10-CM | POA: Diagnosis not present

## 2013-11-07 DIAGNOSIS — E785 Hyperlipidemia, unspecified: Secondary | ICD-10-CM | POA: Diagnosis not present

## 2013-11-07 DIAGNOSIS — Z79899 Other long term (current) drug therapy: Secondary | ICD-10-CM | POA: Diagnosis not present

## 2013-11-07 DIAGNOSIS — Z853 Personal history of malignant neoplasm of breast: Secondary | ICD-10-CM | POA: Diagnosis not present

## 2013-11-07 DIAGNOSIS — M549 Dorsalgia, unspecified: Secondary | ICD-10-CM

## 2013-11-07 MED ORDER — ONDANSETRON HCL 4 MG PO TABS: 4.0000 mg | ORAL_TABLET | Freq: Four times a day (QID) | ORAL | Status: DC

## 2013-11-07 MED ORDER — OXYCODONE-ACETAMINOPHEN 5-325 MG PO TABS: 1.0000 | ORAL_TABLET | Freq: Four times a day (QID) | ORAL | Status: DC | PRN
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
## 2013-11-08 ENCOUNTER — Encounter (HOSPITAL_COMMUNITY): Payer: Self-pay | Admitting: Emergency Medicine

## 2013-11-08 ENCOUNTER — Emergency Department (HOSPITAL_COMMUNITY)
Admission: EM | Admit: 2013-11-08 | Discharge: 2013-11-09 | Disposition: A | Discharge status: Home / Self Care | Payer: Medicaid Other | Attending: Emergency Medicine | Admitting: Emergency Medicine

## 2013-11-08 ENCOUNTER — Emergency Department (HOSPITAL_COMMUNITY): Payer: Medicaid Other

## 2013-11-08 ENCOUNTER — Other Ambulatory Visit: Payer: Self-pay

## 2013-11-08 DIAGNOSIS — I2510 Atherosclerotic heart disease of native coronary artery without angina pectoris: Secondary | ICD-10-CM | POA: Insufficient documentation

## 2013-11-08 DIAGNOSIS — Z79899 Other long term (current) drug therapy: Secondary | ICD-10-CM | POA: Insufficient documentation

## 2013-11-08 DIAGNOSIS — Z860100 Personal history of colon polyps, unspecified: Secondary | ICD-10-CM | POA: Insufficient documentation

## 2013-11-08 DIAGNOSIS — G4730 Sleep apnea, unspecified: Secondary | ICD-10-CM | POA: Insufficient documentation

## 2013-11-08 DIAGNOSIS — Z87891 Personal history of nicotine dependence: Secondary | ICD-10-CM | POA: Insufficient documentation

## 2013-11-08 DIAGNOSIS — R05 Cough: Secondary | ICD-10-CM | POA: Insufficient documentation

## 2013-11-08 DIAGNOSIS — E785 Hyperlipidemia, unspecified: Secondary | ICD-10-CM | POA: Insufficient documentation

## 2013-11-08 DIAGNOSIS — R059 Cough, unspecified: Secondary | ICD-10-CM | POA: Insufficient documentation

## 2013-11-08 DIAGNOSIS — R0789 Other chest pain: Secondary | ICD-10-CM

## 2013-11-08 DIAGNOSIS — Z9861 Coronary angioplasty status: Secondary | ICD-10-CM | POA: Insufficient documentation

## 2013-11-08 DIAGNOSIS — J45909 Unspecified asthma, uncomplicated: Secondary | ICD-10-CM | POA: Insufficient documentation

## 2013-11-08 DIAGNOSIS — E119 Type 2 diabetes mellitus without complications: Secondary | ICD-10-CM | POA: Insufficient documentation

## 2013-11-08 DIAGNOSIS — E663 Overweight: Secondary | ICD-10-CM | POA: Insufficient documentation

## 2013-11-08 DIAGNOSIS — Z7982 Long term (current) use of aspirin: Secondary | ICD-10-CM | POA: Insufficient documentation

## 2013-11-08 DIAGNOSIS — I252 Old myocardial infarction: Secondary | ICD-10-CM | POA: Insufficient documentation

## 2013-11-08 DIAGNOSIS — Z853 Personal history of malignant neoplasm of breast: Secondary | ICD-10-CM | POA: Insufficient documentation

## 2013-11-08 DIAGNOSIS — Z951 Presence of aortocoronary bypass graft: Secondary | ICD-10-CM | POA: Insufficient documentation

## 2013-11-08 DIAGNOSIS — I10 Essential (primary) hypertension: Secondary | ICD-10-CM | POA: Insufficient documentation

## 2013-11-08 LAB — BASIC METABOLIC PANEL
BUN: 12 mg/dL (ref 6–23)
CO2: 27 mEq/L (ref 19–32)
Calcium: 9 mg/dL (ref 8.4–10.5)
Chloride: 104 mEq/L (ref 96–112)
Creatinine, Ser: 0.75 mg/dL (ref 0.50–1.10)
GFR calc Af Amer: >90 mL/min (ref >90)
GFR calc non Af Amer: 88 mL/min — ABNORMAL LOW (ref >90)
Glucose, Bld: 156 mg/dL — ABNORMAL HIGH (ref 70–99)
Potassium: 4.8 mEq/L (ref 3.7–5.3)
Sodium: 142 mEq/L (ref 137–147)

## 2013-11-08 LAB — I-STAT TROPONIN, ED
Troponin i, poc: 0 ng/mL (ref 0.00–0.08)
Troponin i, poc: 0.01 ng/mL (ref 0.00–0.08)

## 2013-11-08 LAB — CBC
HCT: 40.2 % (ref 36.0–46.0)
Hemoglobin: 12.9 g/dL (ref 12.0–15.0)
MCH: 28.7 pg (ref 26.0–34.0)
MCHC: 32.1 g/dL (ref 30.0–36.0)
MCV: 89.5 fL (ref 78.0–100.0)
Platelets: 188 10*3/uL (ref 150–400)
RBC: 4.49 MIL/uL (ref 3.87–5.11)
RDW: 15.1 % (ref 11.5–15.5)
WBC: 6.8 10*3/uL (ref 4.0–10.5)

## 2013-11-08 LAB — PRO B NATRIURETIC PEPTIDE: Pro B Natriuretic peptide (BNP): 31.9 pg/mL (ref 0–125)

## 2013-11-08 MED ORDER — HYDROCODONE-ACETAMINOPHEN 5-325 MG PO TABS
1.0000 | ORAL_TABLET | Freq: Once | ORAL | Status: AC
  Administered 2013-11-09: 1 via ORAL
  Filled 2013-11-08: qty 1

## 2013-11-08 NOTE — ED Notes (Signed)
Pt states productive cough today, with mid CP, HA. Denies nausea.

## 2013-11-08 NOTE — ED Notes (Signed)
Patient transported to X-ray 
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
## 2013-11-08 NOTE — ED Provider Notes (Signed)
Medical screening examination/treatment/procedure(s) were performed by non-physician practitioner and as supervising physician I was immediately available for consultation/collaboration.  EKG Interpretation   None         David H Yao, MD 11/08/13 1118 

## 2013-11-09 NOTE — ED Notes (Signed)
Pts husband is driving her home. Pt A&Ox4, ambulatory at discharge.

## 2013-11-09 NOTE — Discharge Instructions (Signed)
If you were given medicines take as directed.  If you are on coumadin or contraceptives realize their levels and effectiveness is altered by many different medicines.  If you have any reaction (rash, tongues swelling, other) to the medicines stop taking and see a physician.   Please follow up as directed and return to the ER or see a physician for new or worsening symptoms.  Thank you. Finish your antibiotic.
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
## 2013-12-07 NOTE — Progress Notes (Signed)
Rcvd medical records request from [facility].  Provided to HIM.

## 2014-01-14 ENCOUNTER — Encounter (HOSPITAL_COMMUNITY): Payer: Self-pay | Admitting: Emergency Medicine

## 2014-01-14 ENCOUNTER — Emergency Department (HOSPITAL_COMMUNITY): Payer: Medicaid Other

## 2014-01-14 ENCOUNTER — Emergency Department (HOSPITAL_COMMUNITY)
Admission: EM | Admit: 2014-01-14 | Discharge: 2014-01-14 | Disposition: A | Discharge status: Home / Self Care | Payer: Medicaid Other | Attending: Emergency Medicine | Admitting: Emergency Medicine

## 2014-01-14 DIAGNOSIS — J329 Chronic sinusitis, unspecified: Secondary | ICD-10-CM | POA: Insufficient documentation

## 2014-01-14 DIAGNOSIS — E119 Type 2 diabetes mellitus without complications: Secondary | ICD-10-CM | POA: Insufficient documentation

## 2014-01-14 DIAGNOSIS — Z9861 Coronary angioplasty status: Secondary | ICD-10-CM | POA: Insufficient documentation

## 2014-01-14 DIAGNOSIS — Z853 Personal history of malignant neoplasm of breast: Secondary | ICD-10-CM | POA: Insufficient documentation

## 2014-01-14 DIAGNOSIS — Z7982 Long term (current) use of aspirin: Secondary | ICD-10-CM | POA: Insufficient documentation

## 2014-01-14 DIAGNOSIS — Z860100 Personal history of colon polyps, unspecified: Secondary | ICD-10-CM | POA: Insufficient documentation

## 2014-01-14 DIAGNOSIS — E663 Overweight: Secondary | ICD-10-CM | POA: Insufficient documentation

## 2014-01-14 DIAGNOSIS — I252 Old myocardial infarction: Secondary | ICD-10-CM | POA: Insufficient documentation

## 2014-01-14 DIAGNOSIS — I10 Essential (primary) hypertension: Secondary | ICD-10-CM | POA: Insufficient documentation

## 2014-01-14 DIAGNOSIS — E785 Hyperlipidemia, unspecified: Secondary | ICD-10-CM | POA: Insufficient documentation

## 2014-01-14 DIAGNOSIS — Z79899 Other long term (current) drug therapy: Secondary | ICD-10-CM | POA: Insufficient documentation

## 2014-01-14 DIAGNOSIS — Z951 Presence of aortocoronary bypass graft: Secondary | ICD-10-CM | POA: Insufficient documentation

## 2014-01-14 DIAGNOSIS — J45909 Unspecified asthma, uncomplicated: Secondary | ICD-10-CM | POA: Insufficient documentation

## 2014-01-14 DIAGNOSIS — Z87891 Personal history of nicotine dependence: Secondary | ICD-10-CM | POA: Insufficient documentation

## 2014-01-14 DIAGNOSIS — I2510 Atherosclerotic heart disease of native coronary artery without angina pectoris: Secondary | ICD-10-CM | POA: Insufficient documentation

## 2014-01-14 LAB — BASIC METABOLIC PANEL
BUN: 8 mg/dL (ref 6–23)
CALCIUM: 9.1 mg/dL (ref 8.4–10.5)
CO2: 23 mEq/L (ref 19–32)
CREATININE: 0.84 mg/dL (ref 0.50–1.10)
Chloride: 102 mEq/L (ref 96–112)
GFR, EST AFRICAN AMERICAN: 83 mL/min — AB (ref >90)
GFR, EST NON AFRICAN AMERICAN: 72 mL/min — AB (ref >90)
Glucose, Bld: 147 mg/dL — ABNORMAL HIGH (ref 70–99)
Potassium: 4.1 mEq/L (ref 3.7–5.3)
Sodium: 140 mEq/L (ref 137–147)

## 2014-01-14 LAB — CBC WITH DIFFERENTIAL/PLATELET
BASOS ABS: 0 10*3/uL (ref 0.0–0.1)
BASOS PCT: 0 % (ref 0–1)
EOS PCT: 0 % (ref 0–5)
Eosinophils Absolute: 0 10*3/uL (ref 0.0–0.7)
HCT: 40 % (ref 36.0–46.0)
Hemoglobin: 13.1 g/dL (ref 12.0–15.0)
LYMPHS PCT: 30 % (ref 12–46)
Lymphs Abs: 1.8 10*3/uL (ref 0.7–4.0)
MCH: 28.6 pg (ref 26.0–34.0)
MCHC: 32.8 g/dL (ref 30.0–36.0)
MCV: 87.3 fL (ref 78.0–100.0)
MONO ABS: 0.4 10*3/uL (ref 0.1–1.0)
Monocytes Relative: 7 % (ref 3–12)
Neutro Abs: 3.8 10*3/uL (ref 1.7–7.7)
Neutrophils Relative %: 63 % (ref 43–77)
Platelets: 183 10*3/uL (ref 150–400)
RBC: 4.58 MIL/uL (ref 3.87–5.11)
RDW: 15 % (ref 11.5–15.5)
WBC: 6.1 10*3/uL (ref 4.0–10.5)

## 2014-01-14 LAB — I-STAT CG4 LACTIC ACID, ED: LACTIC ACID, VENOUS: 1.45 mmol/L (ref 0.5–2.2)

## 2014-01-14 MED ORDER — FEXOFENADINE HCL 60 MG PO TABS: 60.0000 mg | ORAL_TABLET | Freq: Two times a day (BID) | ORAL | Status: DC

## 2014-01-14 MED ORDER — ACETAMINOPHEN 325 MG PO TABS
650.0000 mg | ORAL_TABLET | Freq: Once | ORAL | Status: AC
  Administered 2014-01-14: 650 mg via ORAL
  Filled 2014-01-14: qty 2

## 2014-01-14 MED ORDER — AMOXICILLIN-POT CLAVULANATE 875-125 MG PO TABS
1.0000 | ORAL_TABLET | Freq: Once | ORAL | Status: AC
  Administered 2014-01-14: 1 via ORAL
  Filled 2014-01-14: qty 1

## 2014-01-14 MED ORDER — AMOXICILLIN-POT CLAVULANATE 875-125 MG PO TABS: 1.0000 | ORAL_TABLET | Freq: Two times a day (BID) | ORAL | Status: DC

## 2014-01-14 MED ORDER — HYDROCODONE-ACETAMINOPHEN 5-325 MG PO TABS
1.0000 | ORAL_TABLET | ORAL | Status: DC | PRN
  Administered 2014-01-14: 1 via ORAL
  Filled 2014-01-14: qty 1

## 2014-01-14 MED ORDER — HYDROCODONE-ACETAMINOPHEN 5-325 MG PO TABS: 2.0000 | ORAL_TABLET | ORAL | Status: DC | PRN

## 2014-01-14 NOTE — ED Notes (Signed)
Otter, MD at bedside.  

## 2014-01-14 NOTE — ED Provider Notes (Signed)
CSN: [id number]     Arrival date & time 01/14/14  0109 History   First MD Initiated Contact with Patient 01/14/14 [phone]     Chief Complaint  Patient presents with  . Headache     (Consider location/radiation/quality/duration/timing/severity/associated sxs/prior Treatment) HPI 63 year old female presents to emergency room from home with complaint of face pain and fever.  Patient was seen 13 days ago by her primary care provider and diagnosed with a sinus infection.  At that time she was having facial pain and drainage.  She was put on amoxicillin twice a day.  Patient finished up the prescription earlier this week.  Starting yesterday, patient developed sore throat, right ear pain, sneezing and coughing.  She reports that she is bringing up very purulent sputum.  She has had fevers to 103 at home.  She reports chills, body aches.  No chest pain, no cough, no headache, no neck pain, no nausea vomiting diarrhea.  Patient is a diabetic, has history of coronary disease status post CABG, past history is a smoker.  Last took Tylenol at 78 PM. Past Medical History  Diagnosis Date  . Hx of CABG     2007  . Hypertension   . Diabetes mellitus   . Ejection fraction     EF 65%, echo, [place], Jan 16, 2011  . Sleep apnea     CPAP being arranged May, 2011  . Dyslipidemia   . CAD (coronary artery disease)     Nuclear stress test Feb 11, 2011, EF 64%, no scar or ischemia    //         catheterization, November, 2011,patent LIMA-LAD-small caliber distal vessel with diffuse plaque, patent SVG to OM1 and OM 2, patent SVG to PDA and PLA, occluded SVG to diagonal, medical therapy;  Lexiscan Myoview 6/14:  Inferior thinning, no ischemia, EF 61%, low risk  . Overweight     stomach stapling 1985 followed by weight loss, then return of weight  . Tobacco abuse     in the past, resolved  . Alcohol abuse     in the past, resolved  . Nausea & vomiting     hospitalization, November, 2011, stricture GE junction,  questionable stenosis gastrojejunostomy, disruptive primary peristaltic wave, GE reflux, delayed emptying proximal gastric pouch  . Hx of colonic polyps   . Asthma   . Myocardial infarction   . Breast cancer     Double mastectomy 2007; s/p tamoxifen and arimidex therapy; no recurrence since 05/2008   Past Surgical History  Procedure Laterality Date  . Coronary artery bypass graft    . Mastectomy    . Coronary stent placement    . Abdominal hysterectomy     Family History  Problem Relation Age of Onset  . Hypertension Mother   . Heart disease Mother   . Diabetes Mother   . Heart disease Father   . Heart disease Sister   . Stomach cancer Sister   . Asthma Sister   . Colon cancer Father    History  Substance Use Topics  . Smoking status: Former Smoker -- 3.00 packs/day for 27 years    Types: Cigarettes    Quit date: 09/14/2000  . Smokeless tobacco: Never Used  . Alcohol Use: No     Comment: History of alcohol abuse but quit 5 years ago   OB History   Grav Para Term Preterm Abortions TAB SAB Ect Mult Living  Review of Systems  See History of Present Illness; otherwise all other systems are reviewed and negative   Allergies  Review of patient's allergies indicates no known allergies.  Home Medications   Prior to Admission medications   Medication Sig Start Date End Date Taking? Authorizing Provider  acetaminophen (TYLENOL) 325 MG tablet Take 650 mg by mouth every 6 (six) hours as needed for mild pain.   Yes Historical Provider, MD  albuterol (PROVENTIL HFA;VENTOLIN HFA) 108 (90 BASE) MCG/ACT inhaler Inhale 2 puffs into the lungs every 6 (six) hours as needed. For shortness of breath   Yes Historical Provider, MD  aspirin EC 81 MG tablet Take 81 mg by mouth every other day.    Yes Historical Provider, MD  HYDROcodone-acetaminophen (NORCO/VICODIN) 5-325 MG per tablet Take 1 tablet by mouth every 6 (six) hours as needed for moderate pain.   Yes Historical  Provider, MD  lisinopril-hydrochlorothiazide (PRINZIDE,ZESTORETIC) 20-12.5 MG per tablet Take 1 tablet by mouth daily.     Yes Historical Provider, MD  nitroGLYCERIN (NITROSTAT) 0.4 MG SL tablet Place 0.4 mg under the tongue every 5 (five) minutes as needed for chest pain.   Yes Historical Provider, MD  RABEprazole (ACIPHEX) 20 MG tablet Take 20 mg by mouth daily.   Yes Historical Provider, MD  simvastatin (ZOCOR) 20 MG tablet Take 20 mg by mouth at bedtime.    Yes Historical Provider, MD  Vitamin D, Ergocalciferol, (DRISDOL) 50000 UNITS CAPS capsule Take 50,000 Units by mouth every Monday.   Yes Historical Provider, MD   BP 116/62  Pulse 103  Temp(Src) 100.1 F (37.8 C) (Oral)  Resp 18  Ht 5\' 1"  (1.549 m)  Wt 212 lb (96.163 kg)  BMI 40.08 kg/m2  SpO2 97% Physical Exam  Nursing note and vitals reviewed. Constitutional: She is oriented to person, place, and time. She appears well-developed and well-nourished.  HENT:  Head: Normocephalic and atraumatic.  Right Ear: External ear normal.  Left Ear: External ear normal.  Nose: Nose normal.  Mouth/Throat: Oropharynx is clear and moist.  Right mastoid tenderness to palpation, no crepitus, overlying skin changes, no bogginess.  Right TM is normal.  Patient has tenderness over on either side of the nose under the eye.   Eyes: Conjunctivae and EOM are normal. Pupils are equal, round, and reactive to light.  Neck: Normal range of motion. Neck supple. No JVD present. No tracheal deviation present. No thyromegaly present.  Neck is supple, no Kernig's or Brudzinski signs, no signs of neck stiffness.  Cardiovascular: Normal rate, regular rhythm, normal heart sounds and intact distal pulses.  Exam reveals no gallop and no friction rub.   No murmur heard. Pulmonary/Chest: Effort normal and breath sounds normal. No stridor. No respiratory distress. She has no wheezes. She has no rales. She exhibits no tenderness.  Abdominal: Soft. Bowel sounds are  normal. She exhibits no distension and no mass. There is no tenderness. There is no rebound and no guarding.  Musculoskeletal: Normal range of motion. She exhibits no edema and no tenderness.  Lymphadenopathy:    She has no cervical adenopathy.  Neurological: She is alert and oriented to person, place, and time. She has normal reflexes. No cranial nerve deficit. She exhibits normal muscle tone. Coordination normal.  Skin: Skin is warm and dry. No rash noted. No erythema. No pallor.  Psychiatric: She has a normal mood and affect. Her behavior is normal. Judgment and thought content normal.    ED Course  Procedures (including  critical care time) Labs Review Labs Reviewed  BASIC METABOLIC PANEL - Abnormal; Notable for the following:    Glucose, Bld 147 (*)    GFR calc non Af Amer 72 (*)    GFR calc Af Amer 83 (*)    All other components within normal limits  CBC WITH DIFFERENTIAL  I-STAT CG4 LACTIC ACID, ED    Imaging Review Ct Maxillofacial Wo Cm  01/14/2014   CLINICAL DATA:  Recent diagnosis of sinus infection. Since last night has had a headache in chills with fever. Right mastoid facial pain.  EXAM: CT MAXILLOFACIAL WITHOUT CONTRAST  TECHNIQUE: Multidetector CT imaging of the maxillofacial structures was performed. Multiplanar CT image reconstructions were also generated. A small metallic BB was placed on the right temple in order to reliably differentiate right from left.  COMPARISON:  CT HEAD W/O CM dated 04/27/2011  FINDINGS: There is mild mucosal thickening in the maxillary antra and ethmoid air cells bilaterally with retention cyst in the right sphenoid sinus. No acute air-fluid levels are demonstrated. The mastoid air cells are patent without evidence of any significant infiltration. Mild prominence of lymph nodes in the angle of the mandible region bilaterally are likely inflammatory. No significant lymphadenopathy. Salivary glands are symmetrical. No cervical fluid collection to  suggest abscess. Globes and extraocular muscles appear intact and symmetrical. Multiple prior tooth extractions.  IMPRESSION: No evidence of mastoiditis. Mild mucosal thickening and retention cyst in the paranasal sinuses.   Electronically Signed   By: Lucienne Capers M.D.   On: 01/14/2014 04:32     EKG Interpretation None      MDM   Final diagnoses:  Sinus infection    63 year old female with fever and facial pain after recent antibiotics.  She also has a right mastoid tenderness.  Given her history of diabetes, I am concerned about mastoiditis or worsening sinus infection.  Plan for CT max face, labs.    Kalman Drape, MD 01/14/14 [phone]

## 2014-01-14 NOTE — ED Notes (Signed)
The pt was sen by her doctor earlier this weeka and diagnosed with a sinus infection .  Since last pm she has had a headache with chills and an elevated temp.  seh took tylenol at 2300 and her temp is down now.  She still feels  Ill  And her headache is no better

## 2014-01-14 NOTE — ED Notes (Signed)
Patient returned from CT

## 2014-01-14 NOTE — Discharge Instructions (Signed)
Take medications as prescribed.  If you are not improving, follow up with your doctor, ENT physician or in the ER.   Sinusitis Sinusitis is redness, soreness, and swelling (inflammation) of the paranasal sinuses. Paranasal sinuses are air pockets within the bones of your face (beneath the eyes, the middle of the forehead, or above the eyes). In healthy paranasal sinuses, mucus is able to drain out, and air is able to circulate through them by way of your nose. However, when your paranasal sinuses are inflamed, mucus and air can become trapped. This can allow bacteria and other germs to grow and cause infection. Sinusitis can develop quickly and last only a short time (acute) or continue over a long period (chronic). Sinusitis that lasts for more than 12 weeks is considered chronic.  CAUSES  Causes of sinusitis include:  Allergies.  Structural abnormalities, such as displacement of the cartilage that separates your nostrils (deviated septum), which can decrease the air flow through your nose and sinuses and affect sinus drainage.  Functional abnormalities, such as when the small hairs (cilia) that line your sinuses and help remove mucus do not work properly or are not present. SYMPTOMS  Symptoms of acute and chronic sinusitis are the same. The primary symptoms are pain and pressure around the affected sinuses. Other symptoms include:  Upper toothache.  Earache.  Headache.  Bad breath.  Decreased sense of smell and taste.  A cough, which worsens when you are lying flat.  Fatigue.  Fever.  Thick drainage from your nose, which often is green and may contain pus (purulent).  Swelling and warmth over the affected sinuses. DIAGNOSIS  Your caregiver will perform a physical exam. During the exam, your caregiver may:  Look in your nose for signs of abnormal growths in your nostrils (nasal polyps).  Tap over the affected sinus to check for signs of infection.  View the inside of your  sinuses (endoscopy) with a special imaging device with a light attached (endoscope), which is inserted into your sinuses. If your caregiver suspects that you have chronic sinusitis, one or more of the following tests may be recommended:  Allergy tests.  Nasal culture A sample of mucus is taken from your nose and sent to a lab and screened for bacteria.  Nasal cytology A sample of mucus is taken from your nose and examined by your caregiver to determine if your sinusitis is related to an allergy. TREATMENT  Most cases of acute sinusitis are related to a viral infection and will resolve on their own within 10 days. Sometimes medicines are prescribed to help relieve symptoms (pain medicine, decongestants, nasal steroid sprays, or saline sprays).  However, for sinusitis related to a bacterial infection, your caregiver will prescribe antibiotic medicines. These are medicines that will help kill the bacteria causing the infection.  Rarely, sinusitis is caused by a fungal infection. In theses cases, your caregiver will prescribe antifungal medicine. For some cases of chronic sinusitis, surgery is needed. Generally, these are cases in which sinusitis recurs more than 3 times per year, despite other treatments. HOME CARE INSTRUCTIONS   Drink plenty of water. Water helps thin the mucus so your sinuses can drain more easily.  Use a humidifier.  Inhale steam 3 to 4 times a day (for example, sit in the bathroom with the shower running).  Apply a warm, moist washcloth to your face 3 to 4 times a day, or as directed by your caregiver.  Use saline nasal sprays to help moisten and  clean your sinuses.  Take over-the-counter or prescription medicines for pain, discomfort, or fever only as directed by your caregiver. SEEK IMMEDIATE MEDICAL CARE IF:  You have increasing pain or severe headaches.  You have nausea, vomiting, or drowsiness.  You have swelling around your face.  You have vision  problems.  You have a stiff neck.  You have difficulty breathing. MAKE SURE YOU:   Understand these instructions.  Will watch your condition.  Will get help right away if you are not doing well or get worse. Document Released: 08/31/2005 Document Revised: 11/23/2011 Document Reviewed: 09/15/2011 [facility] Patient Information 2014 [place], [place].

## 2014-01-31 ENCOUNTER — Encounter: Payer: Self-pay | Admitting: Gastroenterology

## 2014-02-28 ENCOUNTER — Ambulatory Visit (AMBULATORY_SURGERY_CENTER): Payer: Self-pay | Admitting: *Deleted

## 2014-02-28 VITALS — Ht 60.5 in | Wt 213.2 lb

## 2014-02-28 DIAGNOSIS — Z86010 Personal history of colonic polyps: Secondary | ICD-10-CM

## 2014-02-28 MED ORDER — MOVIPREP 100 G PO SOLR: ORAL | Status: DC

## 2014-02-28 NOTE — Progress Notes (Signed)
No allergies to eggs or soy. No problems with anesthesia.  Pt not given Emmi instructions for colonoscopy; no computer access  No oxygen use  No diet drug use  

## 2014-03-14 ENCOUNTER — Encounter: Payer: Medicaid Other | Admitting: Gastroenterology

## 2014-04-02 ENCOUNTER — Encounter: Payer: Self-pay | Admitting: Gastroenterology

## 2014-04-10 ENCOUNTER — Encounter (HOSPITAL_COMMUNITY): Payer: Self-pay | Admitting: Emergency Medicine

## 2014-04-10 ENCOUNTER — Emergency Department (HOSPITAL_COMMUNITY): Payer: Medicaid Other

## 2014-04-10 ENCOUNTER — Emergency Department (HOSPITAL_COMMUNITY)
Admission: EM | Admit: 2014-04-10 | Discharge: 2014-04-10 | Disposition: A | Discharge status: Home / Self Care | Payer: Medicaid Other | Attending: Emergency Medicine | Admitting: Emergency Medicine

## 2014-04-10 DIAGNOSIS — E119 Type 2 diabetes mellitus without complications: Secondary | ICD-10-CM | POA: Diagnosis not present

## 2014-04-10 DIAGNOSIS — N390 Urinary tract infection, site not specified: Secondary | ICD-10-CM | POA: Insufficient documentation

## 2014-04-10 DIAGNOSIS — J45909 Unspecified asthma, uncomplicated: Secondary | ICD-10-CM | POA: Diagnosis not present

## 2014-04-10 DIAGNOSIS — Z9884 Bariatric surgery status: Secondary | ICD-10-CM | POA: Insufficient documentation

## 2014-04-10 DIAGNOSIS — I252 Old myocardial infarction: Secondary | ICD-10-CM | POA: Insufficient documentation

## 2014-04-10 DIAGNOSIS — M129 Arthropathy, unspecified: Secondary | ICD-10-CM | POA: Insufficient documentation

## 2014-04-10 DIAGNOSIS — E785 Hyperlipidemia, unspecified: Secondary | ICD-10-CM | POA: Diagnosis not present

## 2014-04-10 DIAGNOSIS — I2510 Atherosclerotic heart disease of native coronary artery without angina pectoris: Secondary | ICD-10-CM | POA: Insufficient documentation

## 2014-04-10 DIAGNOSIS — Z9981 Dependence on supplemental oxygen: Secondary | ICD-10-CM | POA: Insufficient documentation

## 2014-04-10 DIAGNOSIS — Z860100 Personal history of colon polyps, unspecified: Secondary | ICD-10-CM | POA: Insufficient documentation

## 2014-04-10 DIAGNOSIS — I10 Essential (primary) hypertension: Secondary | ICD-10-CM | POA: Diagnosis not present

## 2014-04-10 DIAGNOSIS — E663 Overweight: Secondary | ICD-10-CM | POA: Diagnosis not present

## 2014-04-10 DIAGNOSIS — N3000 Acute cystitis without hematuria: Secondary | ICD-10-CM | POA: Insufficient documentation

## 2014-04-10 DIAGNOSIS — Z951 Presence of aortocoronary bypass graft: Secondary | ICD-10-CM | POA: Diagnosis not present

## 2014-04-10 DIAGNOSIS — Z90710 Acquired absence of both cervix and uterus: Secondary | ICD-10-CM | POA: Diagnosis not present

## 2014-04-10 DIAGNOSIS — Z87891 Personal history of nicotine dependence: Secondary | ICD-10-CM | POA: Diagnosis not present

## 2014-04-10 DIAGNOSIS — G4730 Sleep apnea, unspecified: Secondary | ICD-10-CM | POA: Insufficient documentation

## 2014-04-10 DIAGNOSIS — R109 Unspecified abdominal pain: Secondary | ICD-10-CM | POA: Insufficient documentation

## 2014-04-10 DIAGNOSIS — Z9861 Coronary angioplasty status: Secondary | ICD-10-CM | POA: Insufficient documentation

## 2014-04-10 DIAGNOSIS — Z853 Personal history of malignant neoplasm of breast: Secondary | ICD-10-CM | POA: Insufficient documentation

## 2014-04-10 DIAGNOSIS — Z7982 Long term (current) use of aspirin: Secondary | ICD-10-CM | POA: Diagnosis not present

## 2014-04-10 DIAGNOSIS — Z79899 Other long term (current) drug therapy: Secondary | ICD-10-CM | POA: Insufficient documentation

## 2014-04-10 LAB — CBC WITH DIFFERENTIAL/PLATELET
BASOS ABS: 0 10*3/uL (ref 0.0–0.1)
Basophils Relative: 0 % (ref 0–1)
Eosinophils Absolute: 0.1 10*3/uL (ref 0.0–0.7)
Eosinophils Relative: 1 % (ref 0–5)
HEMATOCRIT: 42.5 % (ref 36.0–46.0)
Hemoglobin: 13.7 g/dL (ref 12.0–15.0)
LYMPHS PCT: 51 % — AB (ref 12–46)
Lymphs Abs: 2.7 10*3/uL (ref 0.7–4.0)
MCH: 28.4 pg (ref 26.0–34.0)
MCHC: 32.2 g/dL (ref 30.0–36.0)
MCV: 88.2 fL (ref 78.0–100.0)
MONO ABS: 0.4 10*3/uL (ref 0.1–1.0)
Monocytes Relative: 7 % (ref 3–12)
NEUTROS ABS: 2.2 10*3/uL (ref 1.7–7.7)
NEUTROS PCT: 41 % — AB (ref 43–77)
Platelets: 194 10*3/uL (ref 150–400)
RBC: 4.82 MIL/uL (ref 3.87–5.11)
RDW: 14.4 % (ref 11.5–15.5)
WBC: 5.3 10*3/uL (ref 4.0–10.5)

## 2014-04-10 LAB — URINE MICROSCOPIC-ADD ON

## 2014-04-10 LAB — URINALYSIS, ROUTINE W REFLEX MICROSCOPIC
Bilirubin Urine: NEGATIVE
Glucose, UA: NEGATIVE mg/dL
HGB URINE DIPSTICK: NEGATIVE
KETONES UR: NEGATIVE mg/dL
Nitrite: NEGATIVE
PROTEIN: NEGATIVE mg/dL
Specific Gravity, Urine: 1.024 (ref 1.005–1.030)
UROBILINOGEN UA: 1 mg/dL (ref 0.0–1.0)
pH: 6 (ref 5.0–8.0)

## 2014-04-10 LAB — COMPREHENSIVE METABOLIC PANEL
ALBUMIN: 3.6 g/dL (ref 3.5–5.2)
ALT: 16 U/L (ref 0–35)
AST: 20 U/L (ref 0–37)
Alkaline Phosphatase: 91 U/L (ref 39–117)
Anion gap: 15 (ref 5–15)
BUN: 10 mg/dL (ref 6–23)
CO2: 24 mEq/L (ref 19–32)
Calcium: 9.3 mg/dL (ref 8.4–10.5)
Chloride: 102 mEq/L (ref 96–112)
Creatinine, Ser: 0.71 mg/dL (ref 0.50–1.10)
GFR calc Af Amer: >90 mL/min (ref >90)
GFR calc non Af Amer: 89 mL/min — ABNORMAL LOW (ref >90)
Glucose, Bld: 157 mg/dL — ABNORMAL HIGH (ref 70–99)
Potassium: 4.1 mEq/L (ref 3.7–5.3)
Sodium: 141 mEq/L (ref 137–147)
Total Bilirubin: 0.4 mg/dL (ref 0.3–1.2)
Total Protein: 7 g/dL (ref 6.0–8.3)

## 2014-04-10 MED ORDER — CEPHALEXIN 250 MG PO CAPS
500.0000 mg | ORAL_CAPSULE | Freq: Once | ORAL | Status: AC
  Administered 2014-04-10: 500 mg via ORAL
  Filled 2014-04-10: qty 2

## 2014-04-10 MED ORDER — CEPHALEXIN 500 MG PO CAPS: 500.0000 mg | ORAL_CAPSULE | Freq: Three times a day (TID) | ORAL | Status: DC

## 2014-04-10 MED ORDER — OXYCODONE-ACETAMINOPHEN 5-325 MG PO TABS
2.0000 | ORAL_TABLET | Freq: Once | ORAL | Status: AC
  Administered 2014-04-10: 2 via ORAL
  Filled 2014-04-10: qty 2

## 2014-04-10 MED ORDER — HYDROCODONE-ACETAMINOPHEN 5-325 MG PO TABS: 1.0000 | ORAL_TABLET | ORAL | Status: DC | PRN

## 2014-04-10 NOTE — ED Provider Notes (Addendum)
CSN: [id number]     Arrival date & time 04/10/14  7793 History   First MD Initiated Contact with Patient 04/10/14 1109     Chief Complaint  Patient presents with  . Abdominal Pain     HPI Patient reports developing increasing lower abdominal pain of the past 24 hours.  She's also had urinary urgency and frequency.  She denies dysuria.  No fevers or chills.  Denies nausea vomiting.  She reports developing some mild left-sided flank pain.  No history kidney stones.  Denies hematuria.  Symptoms are mild in severity.  No vaginal complaints.  Nothing worsens or improves her symptoms   Past Medical History  Diagnosis Date  . Hx of CABG     2007  . Hypertension   . Diabetes mellitus   . Ejection fraction     EF 65%, echo, [place], Jan 16, 2011  . Dyslipidemia   . CAD (coronary artery disease)     Nuclear stress test Feb 11, 2011, EF 64%, no scar or ischemia    //         catheterization, November, 2011,patent LIMA-LAD-small caliber distal vessel with diffuse plaque, patent SVG to OM1 and OM 2, patent SVG to PDA and PLA, occluded SVG to diagonal, medical therapy;  Lexiscan Myoview 6/14:  Inferior thinning, no ischemia, EF 61%, low risk  . Overweight(278.02)     stomach stapling 1985 followed by weight loss, then return of weight  . Tobacco abuse     in the past, resolved  . Alcohol abuse     in the past, resolved  . Nausea & vomiting     hospitalization, November, 2011, stricture GE junction, questionable stenosis gastrojejunostomy, disruptive primary peristaltic wave, GE reflux, delayed emptying proximal gastric pouch  . Hx of colonic polyps   . Asthma   . Myocardial infarction   . Breast cancer     Double mastectomy 2007; s/p tamoxifen and arimidex therapy; no recurrence since 05/2008  . Sleep apnea     CPAP being arranged May, 2011//does not use CPAP  . Arthritis   . Blood transfusion without reported diagnosis 1984    after surgery  . Hyperlipidemia    Past Surgical History   Procedure Laterality Date  . Coronary artery bypass graft  2009  . Mastectomy Bilateral 1987  . Coronary stent placement    . Abdominal hysterectomy  1989  . Gastric bypass  1983   Family History  Problem Relation Age of Onset  . Hypertension Mother   . Heart disease Mother   . Diabetes Mother   . Heart disease Father   . Colon cancer Father     does not know age of onset  . Heart disease Sister   . Colon cancer Sister 66  . Stomach cancer Sister   . Asthma Sister    History  Substance Use Topics  . Smoking status: Former Smoker -- 3.00 packs/day for 27 years    Types: Cigarettes    Quit date: 09/14/2000  . Smokeless tobacco: Never Used  . Alcohol Use: No     Comment: History of alcohol abuse but quit 5 years ago   OB History   Grav Para Term Preterm Abortions TAB SAB Ect Mult Living                 Review of Systems  All other systems reviewed and are negative.     Allergies  Review of patient's allergies indicates no known  allergies.  Home Medications   Prior to Admission medications   Medication Sig Start Date End Date Taking? Authorizing Provider  acetaminophen (TYLENOL) 325 MG tablet Take 650 mg by mouth every 6 (six) hours as needed for mild pain.   Yes Historical Provider, MD  albuterol (PROVENTIL HFA;VENTOLIN HFA) 108 (90 BASE) MCG/ACT inhaler Inhale 2 puffs into the lungs every 6 (six) hours as needed for shortness of breath.    Yes Historical Provider, MD  aspirin EC 81 MG tablet Take 81 mg by mouth every other day.    Yes Historical Provider, MD  fexofenadine (ALLEGRA) 60 MG tablet Take 1 tablet (60 mg total) by mouth 2 (two) times daily. 01/14/14  Yes Kalman Drape, MD  HYDROcodone-acetaminophen (NORCO/VICODIN) 5-325 MG per tablet Take 1 tablet by mouth every 6 (six) hours as needed for moderate pain.   Yes Historical Provider, MD  lisinopril-hydrochlorothiazide (PRINZIDE,ZESTORETIC) 20-12.5 MG per tablet Take 1 tablet by mouth daily.     Yes Historical  Provider, MD  RABEprazole (ACIPHEX) 20 MG tablet Take 20 mg by mouth daily.   Yes Historical Provider, MD  simvastatin (ZOCOR) 20 MG tablet Take 20 mg by mouth at bedtime.    Yes Historical Provider, MD  Vitamin D, Ergocalciferol, (DRISDOL) 50000 UNITS CAPS capsule Take 50,000 Units by mouth every Monday.   Yes Historical Provider, MD  nitroGLYCERIN (NITROSTAT) 0.4 MG SL tablet Place 0.4 mg under the tongue every 5 (five) minutes as needed for chest pain.    Historical Provider, MD   BP 119/67  Pulse 71  Temp(Src) 98.3 F (36.8 C) (Oral)  Resp 17  SpO2 95% Physical Exam  Nursing note and vitals reviewed. Constitutional: She is oriented to person, place, and time. She appears well-developed and well-nourished. No distress.  HENT:  Head: Normocephalic and atraumatic.  Eyes: EOM are normal.  Neck: Normal range of motion.  Cardiovascular: Normal rate, regular rhythm and normal heart sounds.   Pulmonary/Chest: Effort normal and breath sounds normal.  Abdominal: Soft. She exhibits no distension.  Mild suprapubic abdominal tenderness.  Genitourinary:  No CVA tenderness  Musculoskeletal: Normal range of motion.  Neurological: She is alert and oriented to person, place, and time.  Skin: Skin is warm and dry.  Psychiatric: She has a normal mood and affect. Judgment normal.    ED Course  Procedures (including critical care time) Labs Review Labs Reviewed  COMPREHENSIVE METABOLIC PANEL - Abnormal; Notable for the following:    Glucose, Bld 157 (*)    GFR calc non Af Amer 89 (*)    All other components within normal limits  CBC WITH DIFFERENTIAL - Abnormal; Notable for the following:    Neutrophils Relative % 41 (*)    Lymphocytes Relative 51 (*)    All other components within normal limits  URINALYSIS, ROUTINE W REFLEX MICROSCOPIC    Imaging Review US Renal  04/10/2014   CLINICAL DATA:  Left-sided abdominal pain  EXAM: RENAL/URINARY TRACT ULTRASOUND COMPLETE  COMPARISON:  None.   FINDINGS: Right Kidney:  Length: 10.2 cm. Echogenicity within normal limits. No mass or hydronephrosis visualized.  Left Kidney:  Length: 10.8 cm. Echogenicity within normal limits. No mass or hydronephrosis visualized.  Bladder:  Not visualized.  Decompressed bladder.  IMPRESSION: 1. Normal renal ultrasound.   Electronically Signed   By: Kathreen Devoid   On: 04/10/2014 14:46     EKG Interpretation None      MDM   Final diagnoses:  None  Patient present urinary tract infection.  Renal ultrasound negative. Dc home. eathing    Hoy Morn, MD 04/10/14 [place], MD 04/10/14 1700

## 2014-04-10 NOTE — Discharge Instructions (Signed)

## 2014-04-10 NOTE — ED Notes (Signed)
PT IN ULTRASOUND

## 2014-04-10 NOTE — ED Notes (Signed)
She states shes had abd pain increasingly worse since yesterday and today her back hurts. She reports urinary hesitancy and burning onset this am

## 2014-04-10 NOTE — ED Notes (Signed)
PT REQUESTING XRAY LEFT HAND STATES HAD INJURED KEFT HAND IN April AND IT STILL HUTRS DR. Venora Maples INFORMED

## 2014-04-11 LAB — URINE CULTURE
CULTURE: NO GROWTH
Colony Count: NO GROWTH

## 2014-04-18 ENCOUNTER — Encounter: Payer: Medicaid Other | Admitting: Gastroenterology

## 2014-04-19 ENCOUNTER — Encounter: Payer: Self-pay | Admitting: Gastroenterology

## 2014-05-10 ENCOUNTER — Ambulatory Visit (AMBULATORY_SURGERY_CENTER): Payer: Self-pay | Admitting: *Deleted

## 2014-05-10 VITALS — Ht 59.0 in | Wt 213.4 lb

## 2014-05-10 DIAGNOSIS — Z86010 Personal history of colonic polyps: Secondary | ICD-10-CM

## 2014-05-10 NOTE — Progress Notes (Signed)
No egg or soy allergy  No home oxygen use or problems with anesthesia  No medications for weight loss taken  emmi information given  Pt has had a double mastectomy but states ok to use right side for IV/BP  She has Moviprep at home from last PV so new rx not sent into the pharmacy

## 2014-05-11 ENCOUNTER — Encounter: Payer: Self-pay | Admitting: Cardiology

## 2014-05-11 ENCOUNTER — Ambulatory Visit (INDEPENDENT_AMBULATORY_CARE_PROVIDER_SITE_OTHER): Payer: Medicaid Other | Admitting: Cardiology

## 2014-05-11 VITALS — BP 102/66 | HR 64 | Ht 59.0 in | Wt 212.0 lb

## 2014-05-11 DIAGNOSIS — E785 Hyperlipidemia, unspecified: Secondary | ICD-10-CM

## 2014-05-11 DIAGNOSIS — I25810 Atherosclerosis of coronary artery bypass graft(s) without angina pectoris: Secondary | ICD-10-CM

## 2014-05-11 DIAGNOSIS — I10 Essential (primary) hypertension: Secondary | ICD-10-CM

## 2014-05-11 MED ORDER — NITROGLYCERIN 0.4 MG SL SUBL: 0.4000 mg | SUBLINGUAL_TABLET | SUBLINGUAL | Status: DC | PRN

## 2014-05-11 MED ORDER — ATORVASTATIN CALCIUM 40 MG PO TABS: 40.0000 mg | ORAL_TABLET | Freq: Every day | ORAL | Status: DC

## 2014-05-11 NOTE — Assessment & Plan Note (Signed)
Coronary disease is stable. Her most recent nuclear study was June, 2014. There was no ischemia. No further workup is needed.

## 2014-05-11 NOTE — Assessment & Plan Note (Signed)
The patient is on simvastatin 20 mg daily. I've recommended that we change to 40 mg of atorvastatin to follow the guidelines. She is willing if there is not a marked increase in Price.

## 2014-05-11 NOTE — Progress Notes (Signed)
Patient ID: Patricia Cuevas, female   DOB: 1951/01/25, 63 y.o.   MRN: [id number]    HPI  Patient is seen to followup coronary disease. She is stable. I saw her last August, 2014. She underwent bypass surgery 2009. She had a nuclear stress test 2014 with normal ejection fraction and no significant ischemia. She has rare chest tightness which is random. She's not having any significant shortness of breath.  No Known Allergies  Current Outpatient Prescriptions  Medication Sig Dispense Refill  . acetaminophen (TYLENOL) 325 MG tablet Take 650 mg by mouth every 6 (six) hours as needed for mild pain.      [name] Kitchen albuterol (PROVENTIL HFA;VENTOLIN HFA) 108 (90 BASE) MCG/ACT inhaler Inhale 2 puffs into the lungs every 6 (six) hours as needed for shortness of breath.       [name] Kitchen aspirin EC 81 MG tablet Take 81 mg by mouth every other day.       . cephALEXin (KEFLEX) 500 MG capsule Take 1 capsule (500 mg total) by mouth 3 (three) times daily.  21 capsule  0  . fexofenadine (ALLEGRA) 60 MG tablet Take 1 tablet (60 mg total) by mouth 2 (two) times daily.  60 tablet  0  . HYDROcodone-acetaminophen (NORCO/VICODIN) 5-325 MG per tablet Take 1 tablet by mouth every 4 (four) hours as needed for moderate pain.  15 tablet  0  . lisinopril-hydrochlorothiazide (PRINZIDE,ZESTORETIC) 20-12.5 MG per tablet Take 1 tablet by mouth daily.        . nitroGLYCERIN (NITROSTAT) 0.4 MG SL tablet Place 0.4 mg under the tongue every 5 (five) minutes as needed for chest pain.      . peg 3350 powder (MOVIPREP) 100 G SOLR Take 1 kit by mouth once.      . RABEprazole (ACIPHEX) 20 MG tablet Take 20 mg by mouth daily.      . simvastatin (ZOCOR) 20 MG tablet Take 20 mg by mouth at bedtime.       . Vitamin D, Ergocalciferol, (DRISDOL) 50000 UNITS CAPS capsule Take 50,000 Units by mouth every Monday.       No current facility-administered medications for this visit.    History   Social History  . Marital Status: Single    Spouse Name: N/A   Number of Children: N/A  . Years of Education: N/A   Occupational History  . Disabled    Social History Main Topics  . Smoking status: Former Smoker -- 3.00 packs/day for 27 years    Types: Cigarettes    Quit date: 09/14/2000  . Smokeless tobacco: Never Used  . Alcohol Use: No     Comment: History of alcohol abuse but quit 5 years ago  . Drug Use: No     Comment: Used to use Marijuan but stopped 5 years ago.   [name] Kitchen Sexual Activity: Not on file   Other Topics Concern  . Not on file   Social History Narrative  . No narrative on file    Family History  Problem Relation Age of Onset  . Hypertension Mother   . Heart disease Mother   . Diabetes Mother   . Heart disease Father   . Colon cancer Father     does not know age of onset  . Heart disease Sister   . Colon cancer Sister 105  . Stomach cancer Sister   . Asthma Sister   . Esophageal cancer Neg Hx   . Rectal cancer Neg Hx     Past  Medical History  Diagnosis Date  . Hx of CABG     2007  . Hypertension   . Diabetes mellitus   . Ejection fraction     EF 65%, echo, [place], Jan 16, 2011  . Dyslipidemia   . CAD (coronary artery disease)     Nuclear stress test Feb 11, 2011, EF 64%, no scar or ischemia    //         catheterization, November, 2011,patent LIMA-LAD-small caliber distal vessel with diffuse plaque, patent SVG to OM1 and OM 2, patent SVG to PDA and PLA, occluded SVG to diagonal, medical therapy;  Lexiscan Myoview 6/14:  Inferior thinning, no ischemia, EF 61%, low risk  . Overweight(278.02)     stomach stapling 1985 followed by weight loss, then return of weight  . Tobacco abuse     in the past, resolved  . Alcohol abuse     in the past, resolved  . Nausea & vomiting     hospitalization, November, 2011, stricture GE junction, questionable stenosis gastrojejunostomy, disruptive primary peristaltic wave, GE reflux, delayed emptying proximal gastric pouch  . Hx of colonic polyps   . Asthma   . Myocardial  infarction   . Breast cancer     Double mastectomy 2007; s/p tamoxifen and arimidex therapy; no recurrence since 05/2008  . Sleep apnea     CPAP being arranged May, 2011//does not use CPAP  . Arthritis   . Blood transfusion without reported diagnosis 1984    after surgery  . Hyperlipidemia     Past Surgical History  Procedure Laterality Date  . Coronary artery bypass graft  2009  . Mastectomy Bilateral 1987  . Coronary stent placement    . Abdominal hysterectomy  1989  . Gastric bypass  1983    Patient Active Problem List   Diagnosis Date Noted  . Hx of CABG     Priority: High  . Dyslipidemia     Priority: High  . Ejection fraction     Priority: High  . Upper GI bleed 03/04/2013  . Atypical chest pain 01/07/2013  . History of tobacco abuse 01/07/2013  . SIRS (systemic inflammatory response syndrome) 07/28/2012  . Tachycardia 07/28/2012  . GERD (gastroesophageal reflux disease) 02/24/2011  . CAD (coronary artery disease)   . Overweight   . OSA (obstructive sleep apnea)   . Nausea & vomiting   . Breast cancer   . Hypertension   . Diabetes mellitus     ROS    Patient denies fever, chills, headache, sweats, rash, change in vision, change in hearing, chest pain, cough, nausea vomiting, urinary symptoms. All other systems are reviewed and are negative.  PHYSICAL EXAM  Patient is oriented to person time and place. Affect is normal. She is overweight. Head is atraumatic. Sclera and conjunctiva are normal. There is no jugulovenous distention. Lungs are clear. Respiratory effort is nonlabored. Cardiac exam reveals S1 and S2. There no clicks or significant murmurs. The abdomen is soft. There is no peripheral edema.  Filed Vitals:   05/11/14 1107  BP: 102/66  Pulse: 64  Height: $Remove'4\' 11"'iCULeJx$  (1.499 m)  Weight: 212 lb (96.163 kg)    EKG  ASSESSMENT & PLAN

## 2014-05-11 NOTE — Assessment & Plan Note (Signed)
Blood pressure is controlled. No change in therapy. 

## 2014-05-11 NOTE — Patient Instructions (Signed)
**Note De-Identified Afia Messenger Obfuscation** Your physician has recommended you make the following change in your medication: stop taking Simvastatin and start taking Atorvastatin 40 mg at bedtime.  Your physician wants you to follow-up in: 1 year. You will receive a reminder letter in the mail two months in advance. If you don't receive a letter, please call our office to schedule the follow-up appointment.

## 2014-05-17 ENCOUNTER — Emergency Department (HOSPITAL_COMMUNITY)
Admission: EM | Admit: 2014-05-17 | Discharge: 2014-05-17 | Disposition: A | Discharge status: Home / Self Care | Payer: Medicaid Other | Attending: Emergency Medicine | Admitting: Emergency Medicine

## 2014-05-17 ENCOUNTER — Emergency Department (HOSPITAL_COMMUNITY): Payer: Medicaid Other

## 2014-05-17 ENCOUNTER — Encounter (HOSPITAL_COMMUNITY): Payer: Self-pay | Admitting: Emergency Medicine

## 2014-05-17 DIAGNOSIS — Z951 Presence of aortocoronary bypass graft: Secondary | ICD-10-CM | POA: Diagnosis not present

## 2014-05-17 DIAGNOSIS — Z853 Personal history of malignant neoplasm of breast: Secondary | ICD-10-CM | POA: Insufficient documentation

## 2014-05-17 DIAGNOSIS — Z9981 Dependence on supplemental oxygen: Secondary | ICD-10-CM | POA: Insufficient documentation

## 2014-05-17 DIAGNOSIS — R1013 Epigastric pain: Secondary | ICD-10-CM | POA: Insufficient documentation

## 2014-05-17 DIAGNOSIS — Z87891 Personal history of nicotine dependence: Secondary | ICD-10-CM | POA: Insufficient documentation

## 2014-05-17 DIAGNOSIS — R079 Chest pain, unspecified: Secondary | ICD-10-CM | POA: Insufficient documentation

## 2014-05-17 DIAGNOSIS — G4730 Sleep apnea, unspecified: Secondary | ICD-10-CM | POA: Diagnosis not present

## 2014-05-17 DIAGNOSIS — J0110 Acute frontal sinusitis, unspecified: Secondary | ICD-10-CM | POA: Insufficient documentation

## 2014-05-17 DIAGNOSIS — I2510 Atherosclerotic heart disease of native coronary artery without angina pectoris: Secondary | ICD-10-CM | POA: Insufficient documentation

## 2014-05-17 DIAGNOSIS — I10 Essential (primary) hypertension: Secondary | ICD-10-CM | POA: Diagnosis not present

## 2014-05-17 DIAGNOSIS — J029 Acute pharyngitis, unspecified: Secondary | ICD-10-CM | POA: Diagnosis not present

## 2014-05-17 DIAGNOSIS — Z792 Long term (current) use of antibiotics: Secondary | ICD-10-CM | POA: Insufficient documentation

## 2014-05-17 DIAGNOSIS — R112 Nausea with vomiting, unspecified: Secondary | ICD-10-CM | POA: Diagnosis not present

## 2014-05-17 DIAGNOSIS — Z860100 Personal history of colon polyps, unspecified: Secondary | ICD-10-CM | POA: Insufficient documentation

## 2014-05-17 DIAGNOSIS — M129 Arthropathy, unspecified: Secondary | ICD-10-CM | POA: Diagnosis not present

## 2014-05-17 DIAGNOSIS — Z79899 Other long term (current) drug therapy: Secondary | ICD-10-CM | POA: Insufficient documentation

## 2014-05-17 DIAGNOSIS — I252 Old myocardial infarction: Secondary | ICD-10-CM | POA: Diagnosis not present

## 2014-05-17 DIAGNOSIS — J45909 Unspecified asthma, uncomplicated: Secondary | ICD-10-CM | POA: Diagnosis not present

## 2014-05-17 DIAGNOSIS — Z7982 Long term (current) use of aspirin: Secondary | ICD-10-CM | POA: Diagnosis not present

## 2014-05-17 DIAGNOSIS — E785 Hyperlipidemia, unspecified: Secondary | ICD-10-CM | POA: Insufficient documentation

## 2014-05-17 DIAGNOSIS — E119 Type 2 diabetes mellitus without complications: Secondary | ICD-10-CM | POA: Diagnosis not present

## 2014-05-17 LAB — CBG MONITORING, ED: Glucose-Capillary: 136 mg/dL — ABNORMAL HIGH (ref 70–99)

## 2014-05-17 LAB — CBC WITH DIFFERENTIAL/PLATELET
Basophils Absolute: 0 10*3/uL (ref 0.0–0.1)
Basophils Relative: 0 % (ref 0–1)
Eosinophils Absolute: 0 10*3/uL (ref 0.0–0.7)
Eosinophils Relative: 0 % (ref 0–5)
HCT: 40.4 % (ref 36.0–46.0)
Hemoglobin: 13.4 g/dL (ref 12.0–15.0)
Lymphocytes Relative: 38 % (ref 12–46)
Lymphs Abs: 2.5 10*3/uL (ref 0.7–4.0)
MCH: 28.3 pg (ref 26.0–34.0)
MCHC: 33.2 g/dL (ref 30.0–36.0)
MCV: 85.2 fL (ref 78.0–100.0)
Monocytes Absolute: 0.5 10*3/uL (ref 0.1–1.0)
Monocytes Relative: 8 % (ref 3–12)
Neutro Abs: 3.6 10*3/uL (ref 1.7–7.7)
Neutrophils Relative %: 54 % (ref 43–77)
Platelets: 198 10*3/uL (ref 150–400)
RBC: 4.74 MIL/uL (ref 3.87–5.11)
RDW: 14.1 % (ref 11.5–15.5)
WBC: 6.6 10*3/uL (ref 4.0–10.5)

## 2014-05-17 LAB — URINALYSIS, ROUTINE W REFLEX MICROSCOPIC
Bilirubin Urine: NEGATIVE
Glucose, UA: NEGATIVE mg/dL
Hgb urine dipstick: NEGATIVE
Ketones, ur: NEGATIVE mg/dL
Leukocytes, UA: NEGATIVE
Nitrite: NEGATIVE
Protein, ur: NEGATIVE mg/dL
Specific Gravity, Urine: 1.016 (ref 1.005–1.030)
Urobilinogen, UA: 1 mg/dL (ref 0.0–1.0)
pH: 7 (ref 5.0–8.0)

## 2014-05-17 LAB — TROPONIN I
Troponin I: <0.3 ng/mL (ref <0.30)
Troponin I: <0.3 ng/mL (ref <0.30)

## 2014-05-17 LAB — COMPREHENSIVE METABOLIC PANEL
ALT: 16 U/L (ref 0–35)
AST: 18 U/L (ref 0–37)
Albumin: 3.6 g/dL (ref 3.5–5.2)
Alkaline Phosphatase: 85 U/L (ref 39–117)
Anion gap: 14 (ref 5–15)
BUN: 11 mg/dL (ref 6–23)
CO2: 22 mEq/L (ref 19–32)
Calcium: 9.3 mg/dL (ref 8.4–10.5)
Chloride: 102 mEq/L (ref 96–112)
Creatinine, Ser: 0.65 mg/dL (ref 0.50–1.10)
GFR calc Af Amer: >90 mL/min (ref >90)
GFR calc non Af Amer: >90 mL/min (ref >90)
Glucose, Bld: 160 mg/dL — ABNORMAL HIGH (ref 70–99)
Potassium: 4.2 mEq/L (ref 3.7–5.3)
Sodium: 138 mEq/L (ref 137–147)
Total Bilirubin: 0.3 mg/dL (ref 0.3–1.2)
Total Protein: 6.9 g/dL (ref 6.0–8.3)

## 2014-05-17 MED ORDER — ACETAMINOPHEN-CODEINE 120-12 MG/5ML PO SOLN: 10.0000 mL | ORAL | Status: DC | PRN

## 2014-05-17 MED ORDER — SODIUM CHLORIDE 0.9 % IV BOLUS (SEPSIS)
1000.0000 mL | Freq: Once | INTRAVENOUS | Status: AC
  Administered 2014-05-17: 1000 mL via INTRAVENOUS

## 2014-05-17 MED ORDER — ONDANSETRON HCL 4 MG/2ML IJ SOLN
4.0000 mg | Freq: Once | INTRAMUSCULAR | Status: AC
  Administered 2014-05-17: 4 mg via INTRAVENOUS
  Filled 2014-05-17: qty 2

## 2014-05-17 MED ORDER — GUAIFENESIN ER 1200 MG PO TB12: 1.0000 | ORAL_TABLET | Freq: Two times a day (BID) | ORAL | Status: DC

## 2014-05-17 MED ORDER — MORPHINE SULFATE 4 MG/ML IJ SOLN
4.0000 mg | Freq: Once | INTRAMUSCULAR | Status: AC
  Administered 2014-05-17: 4 mg via INTRAVENOUS
  Filled 2014-05-17: qty 1

## 2014-05-17 NOTE — Discharge Instructions (Signed)
Return here as needed. Follow up with your doctor. INcrease your fluid intake

## 2014-05-17 NOTE — ED Notes (Signed)
Patient transported to X-ray 

## 2014-05-17 NOTE — ED Notes (Signed)
Notified RN of CBG 136 

## 2014-05-17 NOTE — ED Provider Notes (Signed)
  Face-to-face evaluation   History: She was wakened from sleep this morning with chest pain. No recent cardiac issues. She saw her cardiologist. 3 weeks ago, and was stable at that time. She denies shortness of breath, nausea, or vomiting. This morning. Her chest pain has resolved. She was started on medications yesterday, antibiotics, for a sinus infection. She is hungry now.  Physical exam: Alert, elderly, female, in mild discomfort. Heart regular rate and rhythm. No murmur. Lungs clear to auscultation. Chest nontender to palpation. Abdomen normal bowel sounds, soft, mild epigastric tenderness.   Medical screening examination/treatment/procedure(s) were conducted as a shared visit with non-physician practitioner(s) and myself.  I personally evaluated the patient during the encounter  Richarda Blade, MD 05/22/14 [phone]

## 2014-05-17 NOTE — ED Provider Notes (Signed)
CSN: [id number]     Arrival date & time 05/17/14  2297 History   First MD Initiated Contact with Patient 05/17/14 [phone]     Chief Complaint  Patient presents with  . Chest Pain     (Consider location/radiation/quality/duration/timing/severity/associated sxs/prior Treatment) HPI Patricia Cuevas is a 63 year old female with a history of MI (s/p CABG in 2011), diabetes mellitus, asthma, and HTN, who was brought by ambulance due to chest pain. Reports was woken up at 3 am with chest pain located centrally over the sternum, rated as 6/10 and described as "painful tightness", and lasting 1-2 minutes. States the pain occurred on and off since then, but subsided since treatment with ASA and nitroglycerin by EMS. Also reports palpitations, weakness and dizziness during the episodes of pain. Reports nausea and vomiting x2 yesterday, as well as a sinus infection and accompanying headache for over a week. Denies any fever, chills, unexplained weight change, or edema. Currently patient complains of sinus headache.  Past Medical History  Diagnosis Date  . Hx of CABG     2007  . Hypertension   . Diabetes mellitus   . Ejection fraction     EF 65%, echo, [place], Jan 16, 2011  . Dyslipidemia   . CAD (coronary artery disease)     Nuclear stress test Feb 11, 2011, EF 64%, no scar or ischemia    //         catheterization, November, 2011,patent LIMA-LAD-small caliber distal vessel with diffuse plaque, patent SVG to OM1 and OM 2, patent SVG to PDA and PLA, occluded SVG to diagonal, medical therapy;  Lexiscan Myoview 6/14:  Inferior thinning, no ischemia, EF 61%, low risk  . Overweight(278.02)     stomach stapling 1985 followed by weight loss, then return of weight  . Tobacco abuse     in the past, resolved  . Alcohol abuse     in the past, resolved  . Nausea & vomiting     hospitalization, November, 2011, stricture GE junction, questionable stenosis gastrojejunostomy, disruptive primary peristaltic wave, GE  reflux, delayed emptying proximal gastric pouch  . Hx of colonic polyps   . Asthma   . Myocardial infarction   . Breast cancer     Double mastectomy 2007; s/p tamoxifen and arimidex therapy; no recurrence since 05/2008  . Sleep apnea     CPAP being arranged May, 2011//does not use CPAP  . Arthritis   . Blood transfusion without reported diagnosis 1984    after surgery  . Hyperlipidemia    Past Surgical History  Procedure Laterality Date  . Coronary artery bypass graft  2009  . Mastectomy Bilateral 1987  . Coronary stent placement    . Abdominal hysterectomy  1989  . Gastric bypass  1983   Family History  Problem Relation Age of Onset  . Hypertension Mother   . Heart disease Mother   . Diabetes Mother   . Heart disease Father   . Colon cancer Father     does not know age of onset  . Heart disease Sister   . Colon cancer Sister 83  . Stomach cancer Sister   . Asthma Sister   . Esophageal cancer Neg Hx   . Rectal cancer Neg Hx    History  Substance Use Topics  . Smoking status: Former Smoker -- 3.00 packs/day for 27 years    Types: Cigarettes    Quit date: 09/14/2000  . Smokeless tobacco: Never Used  . Alcohol  Use: No     Comment: History of alcohol abuse but quit 5 years ago   OB History   Grav Para Term Preterm Abortions TAB SAB Ect Mult Living                 Review of Systems  Constitutional: Negative for fever, chills, diaphoresis, appetite change, fatigue and unexpected weight change.  HENT: Positive for sinus pressure and sore throat. Negative for rhinorrhea.   Respiratory: Positive for chest tightness. Negative for cough and wheezing.   Cardiovascular: Positive for chest pain and palpitations. Negative for leg swelling.  Gastrointestinal: Positive for nausea and vomiting. Negative for abdominal pain, diarrhea and constipation.  Neurological: Positive for headaches. Negative for dizziness, weakness, light-headedness and numbness.      Allergies   Review of patient's allergies indicates no known allergies.  Home Medications   Prior to Admission medications   Medication Sig Start Date End Date Taking? Authorizing Provider  acetaminophen (TYLENOL) 325 MG tablet Take 650 mg by mouth every 6 (six) hours as needed for mild pain.   Yes Historical Provider, MD  albuterol (PROVENTIL HFA;VENTOLIN HFA) 108 (90 BASE) MCG/ACT inhaler Inhale 2 puffs into the lungs every 6 (six) hours as needed for shortness of breath.    Yes Historical Provider, MD  amoxicillin-clavulanate (AUGMENTIN) 875-125 MG per tablet Take 1 tablet by mouth 2 (two) times daily.   Yes Historical Provider, MD  aspirin EC 81 MG tablet Take 81 mg by mouth every other day.    Yes Historical Provider, MD  atorvastatin (LIPITOR) 40 MG tablet Take 1 tablet (40 mg total) by mouth daily. 05/11/14  Yes [name], MD  flintstones complete (FLINTSTONES) 60 MG chewable tablet Chew 2 tablets by mouth daily.   Yes Historical Provider, MD  HYDROcodone-acetaminophen (NORCO/VICODIN) 5-325 MG per tablet Take 1 tablet by mouth every 4 (four) hours as needed for moderate pain. 04/10/14  Yes Hoy Morn, MD  lisinopril-hydrochlorothiazide (PRINZIDE,ZESTORETIC) 20-12.5 MG per tablet Take 1 tablet by mouth daily.     Yes Historical Provider, MD  nitroGLYCERIN (NITROSTAT) 0.4 MG SL tablet Place 1 tablet (0.4 mg total) under the tongue every 5 (five) minutes as needed for chest pain. 05/11/14  Yes [name], MD  RABEprazole (ACIPHEX) 20 MG tablet Take 20 mg by mouth daily.   Yes Historical Provider, MD   BP 126/77  Pulse 65  Temp(Src) 97.8 F (36.6 C) (Oral)  Resp 14  Ht 4\' 11"  (1.499 m)  Wt 213 lb (96.616 kg)  BMI 43.00 kg/m2  SpO2 96% Physical Exam  Vitals reviewed. Constitutional: She is oriented to person, place, and time. She appears well-developed and well-nourished. No distress.  HENT:  Right Ear: External ear normal.  Left Ear: External ear normal.  Nose: Nose normal.   Mouth/Throat: Oropharynx is clear and moist. No oropharyngeal exudate.  Eyes: Conjunctivae and EOM are normal. Pupils are equal, round, and reactive to light.  Neck: Neck supple. No JVD present.  Cardiovascular: Normal rate and regular rhythm.   Pulmonary/Chest: Effort normal and breath sounds normal. No respiratory distress. She has no wheezes. She has no rales. She exhibits no tenderness.  Abdominal: Soft. Bowel sounds are normal. She exhibits no distension and no mass. There is tenderness. There is no rebound and no guarding.  Mild epigastric tenderness.   Musculoskeletal: She exhibits no edema.  Lymphadenopathy:    She has no cervical adenopathy.  Neurological: She is alert and oriented to person,  place, and time.  Skin: Skin is warm and dry.    ED Course  Procedures (including critical care time) Labs Review Labs Reviewed  COMPREHENSIVE METABOLIC PANEL - Abnormal; Notable for the following:    Glucose, Bld 160 (*)    All other components within normal limits  URINE CULTURE  URINALYSIS, ROUTINE W REFLEX MICROSCOPIC  CBC WITH DIFFERENTIAL  TROPONIN I    Imaging Review Dg Chest 2 View  05/17/2014   CLINICAL DATA:  Chest pain.  EXAM: CHEST  2 VIEW  COMPARISON:  11/08/2013 .  FINDINGS: Mediastinum and hilar structures are normal. Prior CABG. Heart size normal. No pleural effusion or pneumothorax. Surgical clips right chest. Surgical sutures left upper abdomen.  IMPRESSION: 1. No acute cardiopulmonary disease. 2. Prior CABG.  Heart size normal.   Electronically Signed   By: Marcello Moores  Register   On: 05/17/2014 07:47   Patient's main complaint at this time is nasal congestion, and nasal sinus pain.  Patient's chest pain, was lasting less than 1 minute.  Patient is advised followup with her primary care Dr. told to return here as needed.  The patient agrees with plan and all questions were answered.  He seems to be atypical chest pain, and patient, states it does not feel like her previous  episodes of cardiac issues.  The nursing note states, that she was awakened by chest pain.  The patient, states, that her sinus   Pain, is what really was causing the majority of her discomfort.  Patient does not have any chest pain, here in the emergency department    Brent General, PA-C 05/21/14 1038

## 2014-05-17 NOTE — ED Notes (Signed)
Pt states that she was awoken from sleep at 0300 due to chest pain. Pt states that she was seen at her PCP yesterday for chest pain and was given a prescription for NTG. Pt states that she did not take her own NTG. Pt was reporting pain to be 10/10. EMS administered 1 NTG which brought the pt's pain to 8/10. EMS also administered 243mg  ASA, as the pt stated that she had taken 81mg  ASA at home. Pt with hx of MI.

## 2014-05-18 LAB — URINE CULTURE
Colony Count: NO GROWTH
Culture: NO GROWTH

## 2014-05-23 ENCOUNTER — Encounter: Payer: Self-pay | Admitting: Gastroenterology

## 2014-05-23 ENCOUNTER — Ambulatory Visit (AMBULATORY_SURGERY_CENTER): Payer: Medicaid Other | Admitting: Gastroenterology

## 2014-05-23 VITALS — BP 158/76 | HR 68 | Temp 96.7°F | Resp 13 | Ht 59.0 in | Wt 213.0 lb

## 2014-05-23 DIAGNOSIS — Z86010 Personal history of colonic polyps: Secondary | ICD-10-CM

## 2014-05-23 DIAGNOSIS — D124 Benign neoplasm of descending colon: Secondary | ICD-10-CM

## 2014-05-23 DIAGNOSIS — D126 Benign neoplasm of colon, unspecified: Secondary | ICD-10-CM

## 2014-05-23 LAB — GLUCOSE, CAPILLARY
Glucose-Capillary: 107 mg/dL — ABNORMAL HIGH (ref 70–99)
Glucose-Capillary: 105 mg/dL — ABNORMAL HIGH (ref 70–99)

## 2014-05-23 MED ORDER — SODIUM CHLORIDE 0.9 % IV SOLN: 500.0000 mL | INTRAVENOUS | Status: DC

## 2014-05-23 NOTE — Op Note (Signed)
[facility]  [address] [place], [address]   COLONOSCOPY PROCEDURE REPORT  PATIENT: Patricia Cuevas, Patricia Cuevas  MR#: #[id number] BIRTHDATE: 02-22-1951 , 30  yrs. old GENDER: Female ENDOSCOPIST: Milus Banister, MD PROCEDURE DATE:  05/23/2014 PROCEDURE:   Colonoscopy with snare polypectomy First Screening Colonoscopy - Avg.  risk and is 50 yrs.  old or older - No.  Prior Negative Screening - Now for repeat screening. N/A  History of Adenoma - Now for follow-up colonoscopy & has been > or = to 3 yrs.  Yes hx of adenoma.  Has been 3 or more years since last colonoscopy.  Polyps Removed Today? Yes. ASA CLASS:   Class II INDICATIONS:Adenomatous colon polyps: Colonoscopy Dr.  Ferdinand Lango, 10/2009: 2 15mm polyps removed; one was serrated adenoma, the other HP; recommended to have repeat in 1 year due to poor prep.  I did colonoscopy for her 1 year prior, good prep, removed one TA, already in for recall in 2015.  (Colonoscopy Dr.  Ardis Hughs 11/2008; 2 small TAs removed, +hems, she was recommended to have repeat colonoscopy at 5 year interval). MEDICATIONS: MAC sedation, administered by CRNA and propofol (Diprivan) 200mg  IV  DESCRIPTION OF PROCEDURE:   After the risks benefits and alternatives of the procedure were thoroughly explained, informed consent was obtained.  A digital rectal exam revealed no abnormalities of the rectum.   The [id number]  endoscope was introduced through the anus and advanced to the cecum, which was identified by both the appendix and ileocecal valve. No adverse events experienced.   The quality of the prep was good.  The instrument was then slowly withdrawn as the colon was fully examined.  COLON FINDINGS: One polyp was found, removed and sent to pathology. This was in the descending segment, 80mm across, sessile, removed with cold snare.  The examination was otherwise normal. Retroflexed views revealed no abnormalities. The time to cecum=2 minutes  46 seconds.  Withdrawal time=6 minutes 30 seconds.  The scope was withdrawn and the procedure completed. COMPLICATIONS: There were no complications.  ENDOSCOPIC IMPRESSION: One polyp was found, removed and sent to pathology. The examination was otherwise normal.  RECOMMENDATIONS: If the polyp(s) removed today are proven to be adenomatous (pre-cancerous) polyps, you will need a repeat colonoscopy in 5 years.  Otherwise you should continue to follow colorectal cancer screening guidelines for "routine risk" patients with colonoscopy in 10 years.  You will receive a letter within 1-2 weeks with the results of your biopsy as well as final recommendations.  Please call my office if you have not received a letter after 3 weeks.   eSigned:  Milus Banister, MD 05/23/2014 11:06 AM   cc: [name], MD

## 2014-05-23 NOTE — Progress Notes (Signed)
Patient awakening,vss,report to rn 

## 2014-05-23 NOTE — Progress Notes (Signed)
Called to room to assist during endoscopic procedure.  Patient ID and intended procedure confirmed with present staff. Received instructions for my participation in the procedure from the performing physician.  

## 2014-05-23 NOTE — Patient Instructions (Signed)
YOU HAD AN ENDOSCOPIC PROCEDURE TODAY AT THE [facility]: Refer to the procedure report that was given to you for any specific questions about what was found during the examination.  If the procedure report does not answer your questions, please call your gastroenterologist to clarify.  If you requested that your care partner not be given the details of your procedure findings, then the procedure report has been included in a sealed envelope for you to review at your convenience later.  YOU SHOULD EXPECT: Some feelings of bloating in the abdomen. Passage of more gas than usual.  Walking can help get rid of the air that was put into your GI tract during the procedure and reduce the bloating. If you had a lower endoscopy (such as a colonoscopy or flexible sigmoidoscopy) you may notice spotting of blood in your stool or on the toilet paper. If you underwent a bowel prep for your procedure, then you may not have a normal bowel movement for a few days.  DIET: Your first meal following the procedure should be a light meal and then it is ok to progress to your normal diet.  A half-sandwich or bowl of soup is an example of a good first meal.  Heavy or fried foods are harder to digest and may make you feel nauseous or bloated.  Likewise meals heavy in dairy and vegetables can cause extra gas to form and this can also increase the bloating.  Drink plenty of fluids but you should avoid alcoholic beverages for 24 hours.  ACTIVITY: Your care partner should take you home directly after the procedure.  You should plan to take it easy, moving slowly for the rest of the day.  You can resume normal activity the day after the procedure however you should NOT DRIVE or use heavy machinery for 24 hours (because of the sedation medicines used during the test).    SYMPTOMS TO REPORT IMMEDIATELY: A gastroenterologist can be reached at any hour.  During normal business hours, 8:30 AM to 5:00 PM Monday through Friday,  call [phone].  After hours and on weekends, please call the GI answering service at [phone] who will take a message and have the physician on call contact you.   Following lower endoscopy (colonoscopy or flexible sigmoidoscopy):  Excessive amounts of blood in the stool  Significant tenderness or worsening of abdominal pains  Swelling of the abdomen that is new, acute  Fever of 100F or higher   FOLLOW UP: If any biopsies were taken you will be contacted by phone or by letter within the next 1-3 weeks.  Call your gastroenterologist if you have not heard about the biopsies in 3 weeks.  Our staff will call the home number listed on your records the next business day following your procedure to check on you and address any questions or concerns that you may have at that time regarding the information given to you following your procedure. This is a courtesy call and so if there is no answer at the home number and we have not heard from you through the emergency physician on call, we will assume that you have returned to your regular daily activities without incident.  SIGNATURES/CONFIDENTIALITY: You and/or your care partner have signed paperwork which will be entered into your electronic medical record.  These signatures attest to the fact that that the information above on your After Visit Summary has been reviewed and is understood.  Full responsibility of the confidentiality of   this discharge information lies with you and/or your care-partner.  Polyp-handout given  Repeat colonoscopy will be determined by pathology   

## 2014-05-24 ENCOUNTER — Telehealth: Payer: Self-pay | Admitting: *Deleted

## 2014-05-24 NOTE — Telephone Encounter (Signed)
  Follow up Call-  Call back number 05/23/2014  Post procedure Call Back phone  # [phone]  Permission to leave phone message Yes     Patient questions:  Do you have a fever, pain , or abdominal swelling? No. Pain Score  0 *  Have you tolerated food without any problems? Yes.    Have you been able to return to your normal activities? Yes.    Do you have any questions about your discharge instructions: Diet   No. Medications  No. Follow up visit  No.  Do you have questions or concerns about your Care? No.  Actions: * If pain score is 4 or above: No action needed, pain <4.

## 2014-05-29 ENCOUNTER — Encounter: Payer: Self-pay | Admitting: Gastroenterology

## 2014-05-31 ENCOUNTER — Emergency Department (HOSPITAL_COMMUNITY)
Admission: EM | Admit: 2014-05-31 | Discharge: 2014-05-31 | Disposition: A | Discharge status: Home / Self Care | Payer: Medicaid Other | Attending: Emergency Medicine | Admitting: Emergency Medicine

## 2014-05-31 ENCOUNTER — Encounter (HOSPITAL_COMMUNITY): Payer: Self-pay | Admitting: Emergency Medicine

## 2014-05-31 DIAGNOSIS — G4730 Sleep apnea, unspecified: Secondary | ICD-10-CM | POA: Diagnosis not present

## 2014-05-31 DIAGNOSIS — S0990XA Unspecified injury of head, initial encounter: Secondary | ICD-10-CM | POA: Diagnosis not present

## 2014-05-31 DIAGNOSIS — Z87891 Personal history of nicotine dependence: Secondary | ICD-10-CM | POA: Insufficient documentation

## 2014-05-31 DIAGNOSIS — E785 Hyperlipidemia, unspecified: Secondary | ICD-10-CM | POA: Insufficient documentation

## 2014-05-31 DIAGNOSIS — I10 Essential (primary) hypertension: Secondary | ICD-10-CM | POA: Diagnosis not present

## 2014-05-31 DIAGNOSIS — IMO0002 Reserved for concepts with insufficient information to code with codable children: Secondary | ICD-10-CM | POA: Insufficient documentation

## 2014-05-31 DIAGNOSIS — Z79899 Other long term (current) drug therapy: Secondary | ICD-10-CM | POA: Insufficient documentation

## 2014-05-31 DIAGNOSIS — Z860100 Personal history of colon polyps, unspecified: Secondary | ICD-10-CM | POA: Insufficient documentation

## 2014-05-31 DIAGNOSIS — M549 Dorsalgia, unspecified: Secondary | ICD-10-CM

## 2014-05-31 DIAGNOSIS — Z792 Long term (current) use of antibiotics: Secondary | ICD-10-CM | POA: Diagnosis not present

## 2014-05-31 DIAGNOSIS — Z8501 Personal history of malignant neoplasm of esophagus: Secondary | ICD-10-CM | POA: Insufficient documentation

## 2014-05-31 DIAGNOSIS — Z951 Presence of aortocoronary bypass graft: Secondary | ICD-10-CM | POA: Insufficient documentation

## 2014-05-31 DIAGNOSIS — E663 Overweight: Secondary | ICD-10-CM | POA: Insufficient documentation

## 2014-05-31 DIAGNOSIS — Y9389 Activity, other specified: Secondary | ICD-10-CM | POA: Insufficient documentation

## 2014-05-31 DIAGNOSIS — Z9861 Coronary angioplasty status: Secondary | ICD-10-CM | POA: Insufficient documentation

## 2014-05-31 DIAGNOSIS — M129 Arthropathy, unspecified: Secondary | ICD-10-CM | POA: Insufficient documentation

## 2014-05-31 DIAGNOSIS — Y92410 Unspecified street and highway as the place of occurrence of the external cause: Secondary | ICD-10-CM | POA: Diagnosis not present

## 2014-05-31 DIAGNOSIS — J45909 Unspecified asthma, uncomplicated: Secondary | ICD-10-CM | POA: Insufficient documentation

## 2014-05-31 DIAGNOSIS — I2510 Atherosclerotic heart disease of native coronary artery without angina pectoris: Secondary | ICD-10-CM | POA: Insufficient documentation

## 2014-05-31 DIAGNOSIS — I252 Old myocardial infarction: Secondary | ICD-10-CM | POA: Insufficient documentation

## 2014-05-31 DIAGNOSIS — Z9981 Dependence on supplemental oxygen: Secondary | ICD-10-CM | POA: Diagnosis not present

## 2014-05-31 DIAGNOSIS — E119 Type 2 diabetes mellitus without complications: Secondary | ICD-10-CM | POA: Diagnosis not present

## 2014-05-31 DIAGNOSIS — Z7982 Long term (current) use of aspirin: Secondary | ICD-10-CM | POA: Diagnosis not present

## 2014-05-31 MED ORDER — METHOCARBAMOL 500 MG PO TABS
1000.0000 mg | ORAL_TABLET | Freq: Once | ORAL | Status: AC
  Administered 2014-05-31: 1000 mg via ORAL
  Filled 2014-05-31: qty 2

## 2014-05-31 MED ORDER — METHOCARBAMOL 750 MG PO TABS: 750.0000 mg | ORAL_TABLET | Freq: Four times a day (QID) | ORAL | Status: DC | PRN
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
## 2014-05-31 NOTE — Discharge Instructions (Signed)
Read the information below.  Use the prescribed medication as directed.  Please discuss all new medications with your pharmacist.  You may return to the Emergency Department at any time for worsening condition or any new symptoms that concern you.   If you develop fevers, loss of control of bowel or bladder, weakness or numbness in your legs, or are unable to walk, return to the ER for a recheck.     Back Pain, Adult Low back pain is very common. About 1 in 5 people have back pain.The cause of low back pain is rarely dangerous. The pain often gets better over time.About half of people with a sudden onset of back pain feel better in just 2 weeks. About 8 in 10 people feel better by 6 weeks.  CAUSES Some common causes of back pain include:  Strain of the muscles or ligaments supporting the spine.  Wear and tear (degeneration) of the spinal discs.  Arthritis.  Direct injury to the back. DIAGNOSIS Most of the time, the direct cause of low back pain is not known.However, back pain can be treated effectively even when the exact cause of the pain is unknown.Answering your caregiver's questions about your overall health and symptoms is one of the most accurate ways to make sure the cause of your pain is not dangerous. If your caregiver needs more information, he or she may order lab work or imaging tests (X-rays or MRIs).However, even if imaging tests show changes in your back, this usually does not require surgery. HOME CARE INSTRUCTIONS For many people, back pain returns.Since low back pain is rarely dangerous, it is often a condition that people can learn to [facility] their own.   Remain active. It is stressful on the back to sit or stand in one place. Do not sit, drive, or stand in one place for more than 30 minutes at a time. Take short walks on level surfaces as soon as pain allows.Try to increase the length of time you walk each day.  Do not stay in bed.Resting more than 1 or 2 days can  delay your recovery.  Do not avoid exercise or work.Your body is made to move.It is not dangerous to be active, even though your back may hurt.Your back will likely heal faster if you return to being active before your pain is gone.  Pay attention to your body when you bend and lift. Many people have less discomfortwhen lifting if they bend their knees, keep the load close to their bodies,and avoid twisting. Often, the most comfortable positions are those that put less stress on your recovering back.  Find a comfortable position to sleep. Use a firm mattress and lie on your side with your knees slightly bent. If you lie on your back, put a pillow under your knees.  Only take over-the-counter or prescription medicines as directed by your caregiver. Over-the-counter medicines to reduce pain and inflammation are often the most helpful.Your caregiver may prescribe muscle relaxant drugs.These medicines help dull your pain so you can more quickly return to your normal activities and healthy exercise.  Put ice on the injured area.  Put ice in a plastic bag.  Place a towel between your skin and the bag.  Leave the ice on for 15-20 minutes, 03-04 times a day for the first 2 to 3 days. After that, ice and heat may be alternated to reduce pain and spasms.  Ask your caregiver about trying back exercises and gentle massage. This may be of  some benefit.  Avoid feeling anxious or stressed.Stress increases muscle tension and can worsen back pain.It is important to recognize when you are anxious or stressed and learn ways to manage it.Exercise is a great option. SEEK MEDICAL CARE IF:  You have pain that is not relieved with rest or medicine.  You have pain that does not improve in 1 week.  You have new symptoms.  You are generally not feeling well. SEEK IMMEDIATE MEDICAL CARE IF:   You have pain that radiates from your back into your legs.  You develop new bowel or bladder control  problems.  You have unusual weakness or numbness in your arms or legs.  You develop nausea or vomiting.  You develop abdominal pain.  You feel faint. Document Released: 08/31/2005 Document Revised: 03/01/2012 Document Reviewed: 01/02/2014 [facility] Patient Information 2015 [place], [place]. This information is not intended to replace advice given to you by your health care provider. Make sure you discuss any questions you have with your health care provider.  Motor Vehicle Collision It is common to have multiple bruises and sore muscles after a motor vehicle collision (MVC). These tend to feel worse for the first 24 hours. You may have the most stiffness and soreness over the first several hours. You may also feel worse when you wake up the first morning after your collision. After this point, you will usually begin to improve with each day. The speed of improvement often depends on the severity of the collision, the number of injuries, and the location and nature of these injuries. HOME CARE INSTRUCTIONS  Put ice on the injured area.  Put ice in a plastic bag.  Place a towel between your skin and the bag.  Leave the ice on for 15-20 minutes, 3-4 times a day, or as directed by your health care provider.  Drink enough fluids to keep your urine clear or pale yellow. Do not drink alcohol.  Take a warm shower or bath once or twice a day. This will increase blood flow to sore muscles.  You may return to activities as directed by your caregiver. Be careful when lifting, as this may aggravate neck or back pain.  Only take over-the-counter or prescription medicines for pain, discomfort, or fever as directed by your caregiver. Do not use aspirin. This may increase bruising and bleeding. SEEK IMMEDIATE MEDICAL CARE IF:  You have numbness, tingling, or weakness in the arms or legs.  You develop severe headaches not relieved with medicine.  You have severe neck pain, especially tenderness in  the middle of the back of your neck.  You have changes in bowel or bladder control.  There is increasing pain in any area of the body.  You have shortness of breath, light-headedness, dizziness, or fainting.  You have chest pain.  You feel sick to your stomach (nauseous), throw up (vomit), or sweat.  You have increasing abdominal discomfort.  There is blood in your urine, stool, or vomit.  You have pain in your shoulder (shoulder strap areas).  You feel your symptoms are getting worse. MAKE SURE YOU:  Understand these instructions.  Will watch your condition.  Will get help right away if you are not doing well or get worse. Document Released: 08/31/2005 Document Revised: 01/15/2014 Document Reviewed: 01/28/2011 [facility] Patient Information 2015 [place], [place]. This information is not intended to replace advice given to you by your health care provider. Make sure you discuss any questions you have with your health care provider.

## 2014-05-31 NOTE — ED Provider Notes (Signed)
Medical screening examination/treatment/procedure(s) were performed by non-physician practitioner and as supervising physician I was immediately available for consultation/collaboration.   EKG Interpretation None        [place], DO 05/31/14 2307

## 2014-06-20 ENCOUNTER — Emergency Department (HOSPITAL_COMMUNITY)
Admission: EM | Admit: 2014-06-20 | Discharge: 2014-06-20 | Disposition: A | Discharge status: Home / Self Care | Payer: Medicaid Other | Attending: Emergency Medicine | Admitting: Emergency Medicine

## 2014-06-20 ENCOUNTER — Emergency Department (HOSPITAL_COMMUNITY): Payer: Medicaid Other

## 2014-06-20 ENCOUNTER — Encounter (HOSPITAL_COMMUNITY): Payer: Self-pay | Admitting: Emergency Medicine

## 2014-06-20 DIAGNOSIS — Z853 Personal history of malignant neoplasm of breast: Secondary | ICD-10-CM | POA: Diagnosis not present

## 2014-06-20 DIAGNOSIS — E663 Overweight: Secondary | ICD-10-CM | POA: Diagnosis not present

## 2014-06-20 DIAGNOSIS — Z792 Long term (current) use of antibiotics: Secondary | ICD-10-CM | POA: Diagnosis not present

## 2014-06-20 DIAGNOSIS — Z7982 Long term (current) use of aspirin: Secondary | ICD-10-CM | POA: Diagnosis not present

## 2014-06-20 DIAGNOSIS — I252 Old myocardial infarction: Secondary | ICD-10-CM | POA: Diagnosis not present

## 2014-06-20 DIAGNOSIS — G4730 Sleep apnea, unspecified: Secondary | ICD-10-CM | POA: Insufficient documentation

## 2014-06-20 DIAGNOSIS — M1990 Unspecified osteoarthritis, unspecified site: Secondary | ICD-10-CM | POA: Diagnosis not present

## 2014-06-20 DIAGNOSIS — Z79899 Other long term (current) drug therapy: Secondary | ICD-10-CM | POA: Diagnosis not present

## 2014-06-20 DIAGNOSIS — Z9861 Coronary angioplasty status: Secondary | ICD-10-CM | POA: Diagnosis not present

## 2014-06-20 DIAGNOSIS — I2510 Atherosclerotic heart disease of native coronary artery without angina pectoris: Secondary | ICD-10-CM | POA: Diagnosis not present

## 2014-06-20 DIAGNOSIS — Z87891 Personal history of nicotine dependence: Secondary | ICD-10-CM | POA: Insufficient documentation

## 2014-06-20 DIAGNOSIS — J45909 Unspecified asthma, uncomplicated: Secondary | ICD-10-CM | POA: Diagnosis not present

## 2014-06-20 DIAGNOSIS — Z951 Presence of aortocoronary bypass graft: Secondary | ICD-10-CM | POA: Insufficient documentation

## 2014-06-20 DIAGNOSIS — R300 Dysuria: Secondary | ICD-10-CM | POA: Diagnosis not present

## 2014-06-20 DIAGNOSIS — Z9981 Dependence on supplemental oxygen: Secondary | ICD-10-CM | POA: Diagnosis not present

## 2014-06-20 DIAGNOSIS — Z86010 Personal history of colonic polyps: Secondary | ICD-10-CM | POA: Diagnosis not present

## 2014-06-20 DIAGNOSIS — E785 Hyperlipidemia, unspecified: Secondary | ICD-10-CM | POA: Insufficient documentation

## 2014-06-20 DIAGNOSIS — I10 Essential (primary) hypertension: Secondary | ICD-10-CM | POA: Diagnosis not present

## 2014-06-20 DIAGNOSIS — R1032 Left lower quadrant pain: Secondary | ICD-10-CM | POA: Diagnosis present

## 2014-06-20 DIAGNOSIS — E119 Type 2 diabetes mellitus without complications: Secondary | ICD-10-CM | POA: Diagnosis not present

## 2014-06-20 LAB — CBC WITH DIFFERENTIAL/PLATELET
BASOS ABS: 0 10*3/uL (ref 0.0–0.1)
BASOS PCT: 0 % (ref 0–1)
Eosinophils Absolute: 0.1 10*3/uL (ref 0.0–0.7)
Eosinophils Relative: 1 % (ref 0–5)
HCT: 41.6 % (ref 36.0–46.0)
Hemoglobin: 13.5 g/dL (ref 12.0–15.0)
Lymphocytes Relative: 61 % — ABNORMAL HIGH (ref 12–46)
Lymphs Abs: 3.4 10*3/uL (ref 0.7–4.0)
MCH: 27.8 pg (ref 26.0–34.0)
MCHC: 32.5 g/dL (ref 30.0–36.0)
MCV: 85.8 fL (ref 78.0–100.0)
Monocytes Absolute: 0.4 10*3/uL (ref 0.1–1.0)
Monocytes Relative: 8 % (ref 3–12)
NEUTROS ABS: 1.7 10*3/uL (ref 1.7–7.7)
NEUTROS PCT: 30 % — AB (ref 43–77)
PLATELETS: 217 10*3/uL (ref 150–400)
RBC: 4.85 MIL/uL (ref 3.87–5.11)
RDW: 14.8 % (ref 11.5–15.5)
WBC: 5.6 10*3/uL (ref 4.0–10.5)

## 2014-06-20 LAB — URINALYSIS, ROUTINE W REFLEX MICROSCOPIC
BILIRUBIN URINE: NEGATIVE
Glucose, UA: NEGATIVE mg/dL
Hgb urine dipstick: NEGATIVE
Ketones, ur: NEGATIVE mg/dL
LEUKOCYTES UA: NEGATIVE
Nitrite: NEGATIVE
PH: 6 (ref 5.0–8.0)
Protein, ur: NEGATIVE mg/dL
SPECIFIC GRAVITY, URINE: 1.026 (ref 1.005–1.030)
Urobilinogen, UA: 1 mg/dL (ref 0.0–1.0)

## 2014-06-20 LAB — COMPREHENSIVE METABOLIC PANEL
ALT: 21 U/L (ref 0–35)
AST: 26 U/L (ref 0–37)
Albumin: 3.8 g/dL (ref 3.5–5.2)
Alkaline Phosphatase: 95 U/L (ref 39–117)
Anion gap: 12 (ref 5–15)
BILIRUBIN TOTAL: 0.2 mg/dL — AB (ref 0.3–1.2)
BUN: 12 mg/dL (ref 6–23)
CHLORIDE: 101 meq/L (ref 96–112)
CO2: 25 mEq/L (ref 19–32)
CREATININE: 0.75 mg/dL (ref 0.50–1.10)
Calcium: 9.5 mg/dL (ref 8.4–10.5)
GFR calc Af Amer: >90 mL/min (ref >90)
GFR calc non Af Amer: 88 mL/min — ABNORMAL LOW (ref >90)
Glucose, Bld: 130 mg/dL — ABNORMAL HIGH (ref 70–99)
POTASSIUM: 3.9 meq/L (ref 3.7–5.3)
SODIUM: 138 meq/L (ref 137–147)
Total Protein: 7.2 g/dL (ref 6.0–8.3)

## 2014-06-20 LAB — LIPASE, BLOOD: Lipase: 30 U/L (ref 11–59)

## 2014-06-20 MED ORDER — PHENAZOPYRIDINE HCL 95 MG PO TABS: 95.0000 mg | ORAL_TABLET | Freq: Three times a day (TID) | ORAL | Status: DC

## 2014-06-20 MED ORDER — PHENAZOPYRIDINE HCL 100 MG PO TABS
95.0000 mg | ORAL_TABLET | Freq: Once | ORAL | Status: AC
  Administered 2014-06-20: 100 mg via ORAL
  Filled 2014-06-20: qty 1

## 2014-06-20 MED ORDER — ONDANSETRON 4 MG PO TBDP
8.0000 mg | ORAL_TABLET | Freq: Once | ORAL | Status: AC
  Administered 2014-06-20: 8 mg via ORAL
  Filled 2014-06-20: qty 2

## 2014-06-20 MED ORDER — OXYCODONE-ACETAMINOPHEN 5-325 MG PO TABS
1.0000 | ORAL_TABLET | Freq: Once | ORAL | Status: AC
  Administered 2014-06-20: 1 via ORAL
  Filled 2014-06-20: qty 1

## 2014-06-20 NOTE — Discharge Instructions (Signed)
As discussed, your evaluation today has been largely reassuring.  But, it is important that you monitor your condition carefully, and do not hesitate to return to the ED if you develop new, or concerning changes in your condition.  Otherwise, please follow-up with your physician for appropriate ongoing care.  Dysuria Dysuria is the medical term for pain with urination. There are many causes for dysuria, but urinary tract infection is the most common. If a urinalysis was performed it can show that there is a urinary tract infection. A urine culture confirms that you or your child is sick. You will need to follow up with a healthcare provider because:  If a urine culture was done you will need to know the culture results and treatment recommendations.  If the urine culture was positive, you or your child will need to be put on antibiotics or know if the antibiotics prescribed are the right antibiotics for your urinary tract infection.  If the urine culture is negative (no urinary tract infection), then other causes may need to be explored or antibiotics need to be stopped. Today laboratory work may have been done and there does not seem to be an infection. If cultures were done they will take at least 24 to 48 hours to be completed. Today x-rays may have been taken and they read as normal. No cause can be found for the problems. The x-rays may be re-read by a radiologist and you will be contacted if additional findings are made. You or your child may have been put on medications to help with this problem until you can see your primary caregiver. If the problems get better, see your primary caregiver if the problems return. If you were given antibiotics (medications which kill germs), take all of the mediations as directed for the full course of treatment.  If laboratory work was done, you need to find the results. Leave a telephone number where you can be reached. If this is not possible, make sure you  find out how you are to get test results. HOME CARE INSTRUCTIONS   Drink lots of fluids. For adults, drink eight, 8 ounce glasses of clear juice or water a day. For children, replace fluids as suggested by your caregiver.  Empty the bladder often. Avoid holding urine for long periods of time.  After a bowel movement, women should cleanse front to back, using each tissue only once.  Empty your bladder before and after sexual intercourse.  Take all the medicine given to you until it is gone. You may feel better in a few days, but TAKE ALL MEDICINE.  Avoid caffeine, tea, alcohol and carbonated beverages, because they tend to irritate the bladder.  In men, alcohol may irritate the prostate.  Only take over-the-counter or prescription medicines for pain, discomfort, or fever as directed by your caregiver.  If your caregiver has given you a follow-up appointment, it is very important to keep that appointment. Not keeping the appointment could result in a chronic or permanent injury, pain, and disability. If there is any problem keeping the appointment, you must call back to this facility for assistance. SEEK IMMEDIATE MEDICAL CARE IF:   Back pain develops.  A fever develops.  There is nausea (feeling sick to your stomach) or vomiting (throwing up).  Problems are no better with medications or are getting worse. MAKE SURE YOU:   Understand these instructions.  Will watch your condition.  Will get help right away if you are not doing well  or get worse. Document Released: 05/29/2004 Document Revised: 11/23/2011 Document Reviewed: 04/05/2008 [facility] Patient Information 2015 [place], [place]. This information is not intended to replace advice given to you by your health care provider. Make sure you discuss any questions you have with your health care provider.

## 2014-06-20 NOTE — ED Notes (Signed)
The pt is c/o lower abvd pain since yesterday.  She urinaTES ONKLY A SMALL AMOUNT WHEN SHE VOIDS AND IT IS PAINFUL
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
## 2014-06-21 LAB — URINE CULTURE
COLONY COUNT: NO GROWTH
CULTURE: NO GROWTH

## 2014-07-26 ENCOUNTER — Emergency Department (HOSPITAL_COMMUNITY)
Admission: EM | Admit: 2014-07-26 | Discharge: 2014-07-26 | Disposition: A | Discharge status: Home / Self Care | Payer: Medicaid Other | Attending: Emergency Medicine | Admitting: Emergency Medicine

## 2014-07-26 ENCOUNTER — Emergency Department (HOSPITAL_COMMUNITY): Payer: Medicaid Other

## 2014-07-26 ENCOUNTER — Encounter (HOSPITAL_COMMUNITY): Payer: Self-pay | Admitting: Emergency Medicine

## 2014-07-26 DIAGNOSIS — Z79899 Other long term (current) drug therapy: Secondary | ICD-10-CM | POA: Insufficient documentation

## 2014-07-26 DIAGNOSIS — Z7982 Long term (current) use of aspirin: Secondary | ICD-10-CM | POA: Insufficient documentation

## 2014-07-26 DIAGNOSIS — I252 Old myocardial infarction: Secondary | ICD-10-CM | POA: Insufficient documentation

## 2014-07-26 DIAGNOSIS — E785 Hyperlipidemia, unspecified: Secondary | ICD-10-CM | POA: Diagnosis not present

## 2014-07-26 DIAGNOSIS — I10 Essential (primary) hypertension: Secondary | ICD-10-CM | POA: Insufficient documentation

## 2014-07-26 DIAGNOSIS — J069 Acute upper respiratory infection, unspecified: Secondary | ICD-10-CM | POA: Insufficient documentation

## 2014-07-26 DIAGNOSIS — Z951 Presence of aortocoronary bypass graft: Secondary | ICD-10-CM | POA: Diagnosis not present

## 2014-07-26 DIAGNOSIS — Z853 Personal history of malignant neoplasm of breast: Secondary | ICD-10-CM | POA: Diagnosis not present

## 2014-07-26 DIAGNOSIS — J189 Pneumonia, unspecified organism: Secondary | ICD-10-CM | POA: Diagnosis present

## 2014-07-26 DIAGNOSIS — Z87891 Personal history of nicotine dependence: Secondary | ICD-10-CM | POA: Diagnosis not present

## 2014-07-26 DIAGNOSIS — R059 Cough, unspecified: Secondary | ICD-10-CM

## 2014-07-26 DIAGNOSIS — Z86010 Personal history of colonic polyps: Secondary | ICD-10-CM | POA: Diagnosis not present

## 2014-07-26 DIAGNOSIS — J45909 Unspecified asthma, uncomplicated: Secondary | ICD-10-CM | POA: Insufficient documentation

## 2014-07-26 DIAGNOSIS — M1990 Unspecified osteoarthritis, unspecified site: Secondary | ICD-10-CM | POA: Insufficient documentation

## 2014-07-26 DIAGNOSIS — I2510 Atherosclerotic heart disease of native coronary artery without angina pectoris: Secondary | ICD-10-CM | POA: Insufficient documentation

## 2014-07-26 DIAGNOSIS — R05 Cough: Secondary | ICD-10-CM

## 2014-07-26 DIAGNOSIS — E119 Type 2 diabetes mellitus without complications: Secondary | ICD-10-CM | POA: Diagnosis not present

## 2014-07-26 DIAGNOSIS — Z8669 Personal history of other diseases of the nervous system and sense organs: Secondary | ICD-10-CM | POA: Insufficient documentation

## 2014-07-26 LAB — CBC WITH DIFFERENTIAL/PLATELET
Basophils Absolute: 0 10*3/uL (ref 0.0–0.1)
Basophils Relative: 1 % (ref 0–1)
EOS PCT: 1 % (ref 0–5)
Eosinophils Absolute: 0.1 10*3/uL (ref 0.0–0.7)
HCT: 41.9 % (ref 36.0–46.0)
Hemoglobin: 13.3 g/dL (ref 12.0–15.0)
LYMPHS ABS: 2.5 10*3/uL (ref 0.7–4.0)
Lymphocytes Relative: 57 % — ABNORMAL HIGH (ref 12–46)
MCH: 28 pg (ref 26.0–34.0)
MCHC: 31.7 g/dL (ref 30.0–36.0)
MCV: 88.2 fL (ref 78.0–100.0)
Monocytes Absolute: 0.4 10*3/uL (ref 0.1–1.0)
Monocytes Relative: 9 % (ref 3–12)
NEUTROS ABS: 1.4 10*3/uL — AB (ref 1.7–7.7)
Neutrophils Relative %: 32 % — ABNORMAL LOW (ref 43–77)
PLATELETS: 214 10*3/uL (ref 150–400)
RBC: 4.75 MIL/uL (ref 3.87–5.11)
RDW: 15 % (ref 11.5–15.5)
WBC: 4.3 10*3/uL (ref 4.0–10.5)

## 2014-07-26 LAB — URINALYSIS, ROUTINE W REFLEX MICROSCOPIC
Bilirubin Urine: NEGATIVE
GLUCOSE, UA: NEGATIVE mg/dL
Hgb urine dipstick: NEGATIVE
KETONES UR: NEGATIVE mg/dL
LEUKOCYTES UA: NEGATIVE
NITRITE: NEGATIVE
PH: 7.5 (ref 5.0–8.0)
Protein, ur: NEGATIVE mg/dL
Specific Gravity, Urine: 1.019 (ref 1.005–1.030)
Urobilinogen, UA: 1 mg/dL (ref 0.0–1.0)

## 2014-07-26 LAB — BASIC METABOLIC PANEL
ANION GAP: 12 (ref 5–15)
BUN: 10 mg/dL (ref 6–23)
CO2: 24 meq/L (ref 19–32)
Calcium: 9.3 mg/dL (ref 8.4–10.5)
Chloride: 105 mEq/L (ref 96–112)
Creatinine, Ser: 0.7 mg/dL (ref 0.50–1.10)
GFR calc Af Amer: >90 mL/min (ref >90)
GFR calc non Af Amer: 90 mL/min — ABNORMAL LOW (ref >90)
GLUCOSE: 119 mg/dL — AB (ref 70–99)
POTASSIUM: 4.1 meq/L (ref 3.7–5.3)
SODIUM: 141 meq/L (ref 137–147)

## 2014-07-26 MED ORDER — AMOXICILLIN-POT CLAVULANATE 500-125 MG PO TABS: 1.0000 | ORAL_TABLET | Freq: Three times a day (TID) | ORAL | Status: DC

## 2014-07-26 MED ORDER — SODIUM CHLORIDE 0.9 % IV SOLN
INTRAVENOUS | Status: DC
  Administered 2014-07-26: 10:00:00 via INTRAVENOUS

## 2014-07-26 NOTE — ED Notes (Signed)
Patient returned from X-ray 

## 2014-07-26 NOTE — ED Provider Notes (Signed)
Patient seen/examined in the Emergency Department in conjunction with Midlevel Provider Carlota Raspberry Patient reports cough Exam : awake/alert, decreased BS noted bilaterally, no distress noted Plan: workup pending, pt is well appearing and I anticipate d/c home    Sharyon Cable, MD 07/26/14 1032

## 2014-07-26 NOTE — ED Notes (Signed)
Ambulated patient on oximetry. Saturations of 94-96%. (Room Air)

## 2014-07-26 NOTE — ED Notes (Signed)
Patient transported to X-ray 

## 2014-07-26 NOTE — ED Notes (Signed)
Went to her PCP on Nov 3 due to coughing and body aches. They called her yesterday and said her chest xray from the 3rd showed pneumonia and she needed to go to the hospital. She reports still coughing up yellow mucous, aches all over.

## 2014-07-26 NOTE — ED Provider Notes (Signed)
CSN: [id number]     Arrival date & time 07/26/14  0906 History   First MD Initiated Contact with Patient 07/26/14 [phone]     Chief Complaint  Patient presents with  . Pneumonia     (Consider location/radiation/quality/duration/timing/severity/associated sxs/prior Treatment) HPI   Dr. [name]- PCP  Patient to the ER as recommended by her primary care providers.She has a PMH of breast cancer with double mastectomy, CHF (EF 65% 2012), diabetes, hypertension, MI. She was seen on Nov 3 by her PCP for body aches and cough. They did a chest xray, gave her sinus and cold medications for home. They called her today and told her that her chest xray showed pneumonia and that she needed to go to the hospital. Since the 3rd she has continued to have cough with yellow sputum, body aches, chills, and low grade temp between 99-100 degrees farenheit. She denies having any weakness, weight loss, confusion, nausea, vomiting, diarrhea or chest pains, wheezing or SOB. Pt is well appearing with normal vital signs  Past Medical History  Diagnosis Date  . Hx of CABG     2007  . Hypertension   . Diabetes mellitus   . Ejection fraction     EF 65%, echo, [place], Jan 16, 2011  . Dyslipidemia   . CAD (coronary artery disease)     Nuclear stress test Feb 11, 2011, EF 64%, no scar or ischemia    //         catheterization, November, 2011,patent LIMA-LAD-small caliber distal vessel with diffuse plaque, patent SVG to OM1 and OM 2, patent SVG to PDA and PLA, occluded SVG to diagonal, medical therapy;  Lexiscan Myoview 6/14:  Inferior thinning, no ischemia, EF 61%, low risk  . Overweight(278.02)     stomach stapling 1985 followed by weight loss, then return of weight  . Tobacco abuse     in the past, resolved  . Alcohol abuse     in the past, resolved  . Nausea & vomiting     hospitalization, November, 2011, stricture GE junction, questionable stenosis gastrojejunostomy, disruptive primary peristaltic wave, GE  reflux, delayed emptying proximal gastric pouch  . Hx of colonic polyps   . Asthma   . Myocardial infarction   . Breast cancer     Double mastectomy 2007; s/p tamoxifen and arimidex therapy; no recurrence since 05/2008  . Sleep apnea     CPAP being arranged May, 2011//does not use CPAP  . Arthritis   . Blood transfusion without reported diagnosis 1984    after surgery  . Hyperlipidemia    Past Surgical History  Procedure Laterality Date  . Coronary artery bypass graft  2009  . Mastectomy Bilateral 1987  . Coronary stent placement    . Abdominal hysterectomy  1989  . Gastric bypass  1983   Family History  Problem Relation Age of Onset  . Hypertension Mother   . Heart disease Mother   . Diabetes Mother   . Heart disease Father   . Colon cancer Father     does not know age of onset  . Heart disease Sister   . Colon cancer Sister 65  . Stomach cancer Sister   . Asthma Sister   . Esophageal cancer Neg Hx   . Rectal cancer Neg Hx    History  Substance Use Topics  . Smoking status: Former Smoker -- 3.00 packs/day for 27 years    Types: Cigarettes    Quit date: 09/14/2000  .  Smokeless tobacco: Never Used  . Alcohol Use: No     Comment: History of alcohol abuse but quit 5 years ago   OB History    No data available     Review of Systems  10 Systems reviewed and are negative for acute change except as noted in the HPI.    Allergies  Review of patient's allergies indicates no known allergies.  Home Medications   Prior to Admission medications   Medication Sig Start Date End Date Taking? Authorizing Provider  acetaminophen (TYLENOL) 325 MG tablet Take 650 mg by mouth every 6 (six) hours as needed for mild pain.   Yes Historical Provider, MD  aspirin EC 81 MG tablet Take 81 mg by mouth every other day.    Yes Historical Provider, MD  atorvastatin (LIPITOR) 40 MG tablet Take 1 tablet (40 mg total) by mouth daily. 05/11/14  Yes [name], MD  flintstones complete  (FLINTSTONES) 60 MG chewable tablet Chew 2 tablets by mouth daily.   Yes Historical Provider, MD  HYDROcodone-acetaminophen (NORCO/VICODIN) 5-325 MG per tablet Take 1 tablet by mouth every 4 (four) hours as needed for moderate pain. 04/10/14  Yes Hoy Morn, MD  lisinopril-hydrochlorothiazide (PRINZIDE,ZESTORETIC) 20-12.5 MG per tablet Take 1 tablet by mouth daily.     Yes Historical Provider, MD  RABEprazole (ACIPHEX) 20 MG tablet Take 20 mg by mouth daily.   Yes Historical Provider, MD  acetaminophen-codeine 120-12 MG/5ML solution Take 10 mLs by mouth every 4 (four) hours as needed for moderate pain. Patient not taking: Reported on 07/26/2014 05/17/14   Resa Miner Lawyer, PA-C  albuterol (PROVENTIL HFA;VENTOLIN HFA) 108 (90 BASE) MCG/ACT inhaler Inhale 2 puffs into the lungs every 6 (six) hours as needed for shortness of breath.     Historical Provider, MD  amoxicillin-clavulanate (AUGMENTIN) 500-125 MG per tablet Take 1 tablet (500 mg total) by mouth every 8 (eight) hours. 07/26/14   Fredia Chittenden Marilu Favre, PA-C  nitroGLYCERIN (NITROSTAT) 0.4 MG SL tablet Place 1 tablet (0.4 mg total) under the tongue every 5 (five) minutes as needed for chest pain. 05/11/14   [name], MD  phenazopyridine (PYRIDIUM) 95 MG tablet Take 1 tablet (95 mg total) by mouth 3 (three) times daily. Patient not taking: Reported on 07/26/2014 06/20/14   Carmin Muskrat, MD   BP 117/54 mmHg  Pulse 64  Temp(Src) 98.7 F (37.1 C) (Oral)  Resp 20  Ht 4\' 10"  (1.473 m)  Wt 215 lb (97.523 kg)  BMI 44.95 kg/m2  SpO2 96% Physical Exam  Constitutional: She appears well-developed and well-nourished. No distress.  HENT:  Head: Normocephalic and atraumatic.  Eyes: Pupils are equal, round, and reactive to light.  Neck: Normal range of motion. Neck supple.  Cardiovascular: Normal rate and regular rhythm.   Pulmonary/Chest: Effort normal. No respiratory distress. She has no wheezes. She has no rales. She exhibits no tenderness.   Abdominal: Soft.  Neurological: She is alert.  Skin: Skin is warm and dry.  Nursing note and vitals reviewed.   ED Course  Procedures (including critical care time) Labs Review Labs Reviewed  BASIC METABOLIC PANEL - Abnormal; Notable for the following:    Glucose, Bld 119 (*)    GFR calc non Af Amer 90 (*)    All other components within normal limits  CBC WITH DIFFERENTIAL - Abnormal; Notable for the following:    Neutrophils Relative % 32 (*)    Neutro Abs 1.4 (*)    Lymphocytes  Relative 57 (*)    All other components within normal limits  URINALYSIS, ROUTINE W REFLEX MICROSCOPIC    Imaging Review Dg Chest 2 View  07/26/2014   CLINICAL DATA:  Cough.  EXAM: CHEST  2 VIEW  COMPARISON:  05/17/2014  FINDINGS: Normal heart size. Unchanged aortic tortuosity. The patient is status post CABG.  Chronic elevation of the right diaphragm, possibly sequela of previous cardiac surgery. There is no edema, consolidation, effusion, or pneumothorax. Right axillary dissection. Status post gastric bypass.  IMPRESSION: 1. No active cardiopulmonary disease. 2. Chronic elevation of the right diaphragm.   Electronically Signed   By: Jorje Guild M.D.   On: 07/26/2014 10:10     EKG Interpretation   Date/Time:  Thursday July 26 2014 10:45:28 EST Ventricular Rate:  57 PR Interval:  138 QRS Duration: 94 QT Interval:  445 QTC Calculation: 433 R Axis:   19 Text Interpretation:  Sinus rhythm Abnormal R-wave progression, early  transition Borderline T abnormalities, anterior leads Confirmed by  [place] ([id number]) on 07/26/2014 11:00:21 AM      MDM   Final diagnoses:  Cough  URI (upper respiratory infection)    Patient work-up has returned unremarkable in the ED. She aAmbulated patient on oximetry. Saturations of 94-96%. (Room Air). The patient is well appearing and has been seen by Dr.Wickline as well. Due to symptoms persisting greater than 2 weeks, will write for Augmentin  and have her follow-up with her PCP.  63 y.o.Patricia Cuevas's evaluation in the Emergency Department is complete. It has been determined that no acute conditions requiring further emergency intervention are present at this time. The patient/guardian have been advised of the diagnosis and plan. We have discussed signs and symptoms that warrant return to the ED, such as changes or worsening in symptoms.  Vital signs are stable at discharge. Filed Vitals:   07/26/14 1209  BP: 117/54  Pulse:   Temp:   Resp: 20    Patient/guardian has voiced understanding and agreed to follow-up with the PCP or specialist.     Linus Mako, PA-C 07/26/14 [place], MD 07/26/14 1259

## 2014-07-26 NOTE — Discharge Instructions (Signed)
Upper Respiratory Infection, Adult An upper respiratory infection (URI) is also sometimes known as the common cold. The upper respiratory tract includes the nose, sinuses, throat, trachea, and bronchi. Bronchi are the airways leading to the lungs. Most people improve within 1 week, but symptoms can last up to 2 weeks. A residual cough may last even longer.  CAUSES Many different viruses can infect the tissues lining the upper respiratory tract. The tissues become irritated and inflamed and often become very moist. Mucus production is also common. A cold is contagious. You can easily spread the virus to others by oral contact. This includes kissing, sharing a glass, coughing, or sneezing. Touching your mouth or nose and then touching a surface, which is then touched by another person, can also spread the virus. SYMPTOMS  Symptoms typically develop 1 to 3 days after you come in contact with a cold virus. Symptoms vary from person to person. They may include:  Runny nose.  Sneezing.  Nasal congestion.  Sinus irritation.  Sore throat.  Loss of voice (laryngitis).  Cough.  Fatigue.  Muscle aches.  Loss of appetite.  Headache.  Low-grade fever. DIAGNOSIS  You might diagnose your own cold based on familiar symptoms, since most people get a cold 2 to 3 times a year. Your caregiver can confirm this based on your exam. Most importantly, your caregiver can check that your symptoms are not due to another disease such as strep throat, sinusitis, pneumonia, asthma, or epiglottitis. Blood tests, throat tests, and X-rays are not necessary to diagnose a common cold, but they may sometimes be helpful in excluding other more serious diseases. Your caregiver will decide if any further tests are required. RISKS AND COMPLICATIONS  You may be at risk for a more severe case of the common cold if you smoke cigarettes, have chronic heart disease (such as heart failure) or lung disease (such as asthma), or if  you have a weakened immune system. The very young and very old are also at risk for more serious infections. Bacterial sinusitis, middle ear infections, and bacterial pneumonia can complicate the common cold. The common cold can worsen asthma and chronic obstructive pulmonary disease (COPD). Sometimes, these complications can require emergency medical care and may be life-threatening. PREVENTION  The best way to protect against getting a cold is to practice good hygiene. Avoid oral or hand contact with people with cold symptoms. Wash your hands often if contact occurs. There is no clear evidence that vitamin C, vitamin E, echinacea, or exercise reduces the chance of developing a cold. However, it is always recommended to get plenty of rest and practice good nutrition. TREATMENT  Treatment is directed at relieving symptoms. There is no cure. Antibiotics are not effective, because the infection is caused by a virus, not by bacteria. Treatment may include:  Increased fluid intake. Sports drinks offer valuable electrolytes, sugars, and fluids.  Breathing heated mist or steam (vaporizer or shower).  Eating chicken soup or other clear broths, and maintaining good nutrition.  Getting plenty of rest.  Using gargles or lozenges for comfort.  Controlling fevers with ibuprofen or acetaminophen as directed by your caregiver.  Increasing usage of your inhaler if you have asthma. Zinc gel and zinc lozenges, taken in the first 24 hours of the common cold, can shorten the duration and lessen the severity of symptoms. Pain medicines may help with fever, muscle aches, and throat pain. A variety of non-prescription medicines are available to treat congestion and runny nose. Your caregiver   can make recommendations and may suggest nasal or lung inhalers for other symptoms.  HOME CARE INSTRUCTIONS   Only take over-the-counter or prescription medicines for pain, discomfort, or fever as directed by your  caregiver.  Use a warm mist humidifier or inhale steam from a shower to increase air moisture. This may keep secretions moist and make it easier to breathe.  Drink enough water and fluids to keep your urine clear or pale yellow.  Rest as needed.  Return to work when your temperature has returned to normal or as your caregiver advises. You may need to stay home longer to avoid infecting others. You can also use a face mask and careful hand washing to prevent spread of the virus. SEEK MEDICAL CARE IF:   After the first few days, you feel you are getting worse rather than better.  You need your caregiver's advice about medicines to control symptoms.  You develop chills, worsening shortness of breath, or brown or red sputum. These may be signs of pneumonia.  You develop yellow or brown nasal discharge or pain in the face, especially when you bend forward. These may be signs of sinusitis.  You develop a fever, swollen neck glands, pain with swallowing, or white areas in the back of your throat. These may be signs of strep throat. SEEK IMMEDIATE MEDICAL CARE IF:   You have a fever.  You develop severe or persistent headache, ear pain, sinus pain, or chest pain.  You develop wheezing, a prolonged cough, cough up blood, or have a change in your usual mucus (if you have chronic lung disease).  You develop sore muscles or a stiff neck. Document Released: 02/24/2001 Document Revised: 11/23/2011 Document Reviewed: 12/06/2013 [employer] Patient Information 2015 [employer]. This information is not intended to replace advice given to you by your health care provider. Make sure you discuss any questions you have with your health care provider.  

## 2014-08-16 ENCOUNTER — Emergency Department (HOSPITAL_COMMUNITY): Payer: Medicaid Other

## 2014-08-16 ENCOUNTER — Emergency Department (HOSPITAL_COMMUNITY)
Admission: EM | Admit: 2014-08-16 | Discharge: 2014-08-16 | Disposition: A | Discharge status: Home / Self Care | Payer: Medicaid Other | Attending: Emergency Medicine | Admitting: Emergency Medicine

## 2014-08-16 ENCOUNTER — Encounter (HOSPITAL_COMMUNITY): Payer: Self-pay | Admitting: Emergency Medicine

## 2014-08-16 DIAGNOSIS — G4730 Sleep apnea, unspecified: Secondary | ICD-10-CM | POA: Diagnosis not present

## 2014-08-16 DIAGNOSIS — E785 Hyperlipidemia, unspecified: Secondary | ICD-10-CM | POA: Diagnosis not present

## 2014-08-16 DIAGNOSIS — Z9981 Dependence on supplemental oxygen: Secondary | ICD-10-CM | POA: Insufficient documentation

## 2014-08-16 DIAGNOSIS — Z87891 Personal history of nicotine dependence: Secondary | ICD-10-CM | POA: Insufficient documentation

## 2014-08-16 DIAGNOSIS — Z951 Presence of aortocoronary bypass graft: Secondary | ICD-10-CM | POA: Insufficient documentation

## 2014-08-16 DIAGNOSIS — Z7982 Long term (current) use of aspirin: Secondary | ICD-10-CM | POA: Insufficient documentation

## 2014-08-16 DIAGNOSIS — I252 Old myocardial infarction: Secondary | ICD-10-CM | POA: Diagnosis not present

## 2014-08-16 DIAGNOSIS — Z853 Personal history of malignant neoplasm of breast: Secondary | ICD-10-CM | POA: Insufficient documentation

## 2014-08-16 DIAGNOSIS — R109 Unspecified abdominal pain: Secondary | ICD-10-CM

## 2014-08-16 DIAGNOSIS — Z90710 Acquired absence of both cervix and uterus: Secondary | ICD-10-CM | POA: Diagnosis not present

## 2014-08-16 DIAGNOSIS — Z86010 Personal history of colonic polyps: Secondary | ICD-10-CM | POA: Insufficient documentation

## 2014-08-16 DIAGNOSIS — E119 Type 2 diabetes mellitus without complications: Secondary | ICD-10-CM | POA: Insufficient documentation

## 2014-08-16 DIAGNOSIS — J45909 Unspecified asthma, uncomplicated: Secondary | ICD-10-CM | POA: Insufficient documentation

## 2014-08-16 DIAGNOSIS — I2510 Atherosclerotic heart disease of native coronary artery without angina pectoris: Secondary | ICD-10-CM | POA: Insufficient documentation

## 2014-08-16 DIAGNOSIS — R1084 Generalized abdominal pain: Secondary | ICD-10-CM | POA: Insufficient documentation

## 2014-08-16 DIAGNOSIS — Z9861 Coronary angioplasty status: Secondary | ICD-10-CM | POA: Diagnosis not present

## 2014-08-16 DIAGNOSIS — Z9884 Bariatric surgery status: Secondary | ICD-10-CM | POA: Insufficient documentation

## 2014-08-16 DIAGNOSIS — M1990 Unspecified osteoarthritis, unspecified site: Secondary | ICD-10-CM | POA: Diagnosis not present

## 2014-08-16 DIAGNOSIS — Z79899 Other long term (current) drug therapy: Secondary | ICD-10-CM | POA: Diagnosis not present

## 2014-08-16 DIAGNOSIS — I10 Essential (primary) hypertension: Secondary | ICD-10-CM | POA: Insufficient documentation

## 2014-08-16 HISTORY — DX: Heart failure, unspecified: I50.9

## 2014-08-16 LAB — URINE MICROSCOPIC-ADD ON

## 2014-08-16 LAB — COMPREHENSIVE METABOLIC PANEL
ALT: 14 U/L (ref 0–35)
ANION GAP: 12 (ref 5–15)
AST: 17 U/L (ref 0–37)
Albumin: 3.6 g/dL (ref 3.5–5.2)
Alkaline Phosphatase: 84 U/L (ref 39–117)
BILIRUBIN TOTAL: 0.3 mg/dL (ref 0.3–1.2)
BUN: 9 mg/dL (ref 6–23)
CHLORIDE: 105 meq/L (ref 96–112)
CO2: 24 meq/L (ref 19–32)
CREATININE: 0.71 mg/dL (ref 0.50–1.10)
Calcium: 9.5 mg/dL (ref 8.4–10.5)
GFR calc Af Amer: >90 mL/min (ref >90)
GFR, EST NON AFRICAN AMERICAN: 89 mL/min — AB (ref >90)
Glucose, Bld: 158 mg/dL — ABNORMAL HIGH (ref 70–99)
POTASSIUM: 4.3 meq/L (ref 3.7–5.3)
Sodium: 141 mEq/L (ref 137–147)
Total Protein: 7 g/dL (ref 6.0–8.3)

## 2014-08-16 LAB — CBC WITH DIFFERENTIAL/PLATELET
BASOS PCT: 1 % (ref 0–1)
Basophils Absolute: 0 10*3/uL (ref 0.0–0.1)
Eosinophils Absolute: 0.1 10*3/uL (ref 0.0–0.7)
Eosinophils Relative: 1 % (ref 0–5)
HEMATOCRIT: 41.2 % (ref 36.0–46.0)
HEMOGLOBIN: 13.1 g/dL (ref 12.0–15.0)
LYMPHS ABS: 2.7 10*3/uL (ref 0.7–4.0)
Lymphocytes Relative: 49 % — ABNORMAL HIGH (ref 12–46)
MCH: 27.5 pg (ref 26.0–34.0)
MCHC: 31.8 g/dL (ref 30.0–36.0)
MCV: 86.4 fL (ref 78.0–100.0)
Monocytes Absolute: 0.5 10*3/uL (ref 0.1–1.0)
Monocytes Relative: 9 % (ref 3–12)
NEUTROS ABS: 2.2 10*3/uL (ref 1.7–7.7)
NEUTROS PCT: 40 % — AB (ref 43–77)
Platelets: 209 10*3/uL (ref 150–400)
RBC: 4.77 MIL/uL (ref 3.87–5.11)
RDW: 15 % (ref 11.5–15.5)
WBC: 5.6 10*3/uL (ref 4.0–10.5)

## 2014-08-16 LAB — URINALYSIS, ROUTINE W REFLEX MICROSCOPIC
Glucose, UA: NEGATIVE mg/dL
Hgb urine dipstick: NEGATIVE
KETONES UR: 15 mg/dL — AB
Nitrite: NEGATIVE
PH: 5.5 (ref 5.0–8.0)
Protein, ur: NEGATIVE mg/dL
Specific Gravity, Urine: 1.03 (ref 1.005–1.030)
Urobilinogen, UA: 1 mg/dL (ref 0.0–1.0)

## 2014-08-16 LAB — LIPASE, BLOOD: Lipase: 32 U/L (ref 11–59)

## 2014-08-16 MED ORDER — MORPHINE SULFATE 4 MG/ML IJ SOLN
4.0000 mg | Freq: Once | INTRAMUSCULAR | Status: AC
  Administered 2014-08-16: 4 mg via INTRAVENOUS
  Filled 2014-08-16: qty 1

## 2014-08-16 MED ORDER — IOHEXOL 300 MG/ML  SOLN: 25.0000 mL | Freq: Once | INTRAMUSCULAR | Status: DC | PRN

## 2014-08-16 MED ORDER — IOHEXOL 300 MG/ML  SOLN
100.0000 mL | Freq: Once | INTRAMUSCULAR | Status: AC | PRN
  Administered 2014-08-16: 100 mL via INTRAVENOUS

## 2014-08-16 NOTE — ED Notes (Signed)
Pt c/o RLQ pain into back x 2 days; pt denies dysuria or diarrhea

## 2014-08-16 NOTE — ED Provider Notes (Signed)
Patient presented to the ER with abdominal pain. Patient complaining of pain in the right lower abdomen since last night. Pain has been constant, not severe. No vomiting or diarrhea.  Face to face Exam: HEENT - PERRLA Lungs - CTAB Heart - RRR, no M/R/G Abd - soft, nondistended. Tenderness without guarding or rebound in the right upper quadrant. Neuro - alert, oriented x3  Plan: With continuous abdominal pain since last night. Obtain urine, lab work. Imaging to rule out intra-abdominal pathology.   Orpah Greek, MD 08/16/14 [phone]

## 2014-08-16 NOTE — ED Notes (Signed)
3:30 PM. Patient care assumed from [place] at shift change. CT abd pending. Patient's lab work is reassuring.  5:07 PM patient's CT abdomen and pelvis with contrast was unremarkable. We'll discharge this patient home. Patient reports that her abdominal pain has much improved with pain medicine. Patient is requesting something to eat and drink. Patient will be given something to eat and drink before she is discharged. The patient does not wish for any prescription for pain medicine at home.  Patient's abdomen is soft. Patient is afebrile and nontoxic appearing. Advised patient to follow-up with her primary care provider this week. Advised patient return to the emergency department with new or worsening symptoms or new concerns. Patient verbalized understanding and agreement.  This patient was discussed with and evaluated by Dr. Betsey Holiday who agrees with assessment and plan.  Hanley Hays, PA-C 08/16/14 [phone]

## 2014-08-16 NOTE — Discharge Instructions (Signed)

## 2014-08-16 NOTE — ED Provider Notes (Signed)
CSN: [id number]     Arrival date & time 08/16/14  1020 History   First MD Initiated Contact with Patient 08/16/14 1041     Chief Complaint  Patient presents with  . Abdominal Pain     (Consider location/radiation/quality/duration/timing/severity/associated sxs/prior Treatment) Patient is a 63 y.o. female presenting with abdominal pain. The history is provided by the patient and medical records.  Abdominal Pain   This is a 63 year old female with past medical history significant for hypertension, diabetes, coronary artery disease, hyperlipidemia, presenting to the ED for abdominal pain, ongoing since yesterday. Patient states she has generalized pain, worse in her right lower quadrant. States she initially thought she was constipated, took a laxative and had a full bowel movement without relief. She denies any associated nausea, vomiting, or diarrhea. No known sick contacts. No dysuria or urinary frequency. Denies fever but endorses subjective chills. Prior abdominal surgeries include gastric bypass and hysterectomy.  Denies chest pain or SOB.  VS stable on arrival.  Past Medical History  Diagnosis Date  . Hx of CABG     2007  . Hypertension   . Diabetes mellitus   . Ejection fraction     EF 65%, echo, [place], Jan 16, 2011  . Dyslipidemia   . CAD (coronary artery disease)     Nuclear stress test Feb 11, 2011, EF 64%, no scar or ischemia    //         catheterization, November, 2011,patent LIMA-LAD-small caliber distal vessel with diffuse plaque, patent SVG to OM1 and OM 2, patent SVG to PDA and PLA, occluded SVG to diagonal, medical therapy;  Lexiscan Myoview 6/14:  Inferior thinning, no ischemia, EF 61%, low risk  . Overweight(278.02)     stomach stapling 1985 followed by weight loss, then return of weight  . Tobacco abuse     in the past, resolved  . Alcohol abuse     in the past, resolved  . Nausea & vomiting     hospitalization, November, 2011, stricture GE junction,  questionable stenosis gastrojejunostomy, disruptive primary peristaltic wave, GE reflux, delayed emptying proximal gastric pouch  . Hx of colonic polyps   . Asthma   . Myocardial infarction   . Breast cancer     Double mastectomy 2007; s/p tamoxifen and arimidex therapy; no recurrence since 05/2008  . Sleep apnea     CPAP being arranged May, 2011//does not use CPAP  . Arthritis   . Blood transfusion without reported diagnosis 1984    after surgery  . Hyperlipidemia    Past Surgical History  Procedure Laterality Date  . Coronary artery bypass graft  2009  . Mastectomy Bilateral 1987  . Coronary stent placement    . Abdominal hysterectomy  1989  . Gastric bypass  1983   Family History  Problem Relation Age of Onset  . Hypertension Mother   . Heart disease Mother   . Diabetes Mother   . Heart disease Father   . Colon cancer Father     does not know age of onset  . Heart disease Sister   . Colon cancer Sister 55  . Stomach cancer Sister   . Asthma Sister   . Esophageal cancer Neg Hx   . Rectal cancer Neg Hx    History  Substance Use Topics  . Smoking status: Former Smoker -- 3.00 packs/day for 27 years    Types: Cigarettes    Quit date: 09/14/2000  . Smokeless tobacco: Never Used  .  Alcohol Use: No     Comment: History of alcohol abuse but quit 5 years ago   OB History    No data available     Review of Systems  Gastrointestinal: Positive for abdominal pain.  All other systems reviewed and are negative.     Allergies  Review of patient's allergies indicates no known allergies.  Home Medications   Prior to Admission medications   Medication Sig Start Date End Date Taking? Authorizing Provider  acetaminophen (TYLENOL) 325 MG tablet Take 650 mg by mouth every 6 (six) hours as needed for mild pain.    Historical Provider, MD  acetaminophen-codeine 120-12 MG/5ML solution Take 10 mLs by mouth every 4 (four) hours as needed for moderate pain. Patient not taking:  Reported on 07/26/2014 05/17/14   Resa Miner Lawyer, PA-C  albuterol (PROVENTIL HFA;VENTOLIN HFA) 108 (90 BASE) MCG/ACT inhaler Inhale 2 puffs into the lungs every 6 (six) hours as needed for shortness of breath.     Historical Provider, MD  amoxicillin-clavulanate (AUGMENTIN) 500-125 MG per tablet Take 1 tablet (500 mg total) by mouth every 8 (eight) hours. 07/26/14   Tiffany Marilu Favre, PA-C  aspirin EC 81 MG tablet Take 81 mg by mouth every other day.     Historical Provider, MD  atorvastatin (LIPITOR) 40 MG tablet Take 1 tablet (40 mg total) by mouth daily. 05/11/14   [name], MD  flintstones complete (FLINTSTONES) 60 MG chewable tablet Chew 2 tablets by mouth daily.    Historical Provider, MD  HYDROcodone-acetaminophen (NORCO/VICODIN) 5-325 MG per tablet Take 1 tablet by mouth every 4 (four) hours as needed for moderate pain. 04/10/14   Hoy Morn, MD  lisinopril-hydrochlorothiazide (PRINZIDE,ZESTORETIC) 20-12.5 MG per tablet Take 1 tablet by mouth daily.      Historical Provider, MD  nitroGLYCERIN (NITROSTAT) 0.4 MG SL tablet Place 1 tablet (0.4 mg total) under the tongue every 5 (five) minutes as needed for chest pain. 05/11/14   [name], MD  phenazopyridine (PYRIDIUM) 95 MG tablet Take 1 tablet (95 mg total) by mouth 3 (three) times daily. Patient not taking: Reported on 07/26/2014 06/20/14   Carmin Muskrat, MD  RABEprazole (ACIPHEX) 20 MG tablet Take 20 mg by mouth daily.    Historical Provider, MD   BP 122/63 mmHg  Pulse 83  Temp(Src) 97.7 F (36.5 C) (Oral)  Resp 22  Ht 4\' 11"  (1.499 m)  Wt 215 lb (97.523 kg)  BMI 43.40 kg/m2  SpO2 98%   Physical Exam  Constitutional: She is oriented to person, place, and time. She appears well-developed and well-nourished. No distress.  HENT:  Head: Normocephalic and atraumatic.  Mouth/Throat: Oropharynx is clear and moist.  Eyes: Conjunctivae and EOM are normal. Pupils are equal, round, and reactive to light.  Neck: Normal  range of motion. Neck supple.  Cardiovascular: Normal rate, regular rhythm and normal heart sounds.   Pulmonary/Chest: Effort normal and breath sounds normal. No respiratory distress. She has no wheezes.  Abdominal: Soft. Bowel sounds are normal. There is tenderness in the right lower quadrant, epigastric area, periumbilical area and suprapubic area. There is no guarding.  Abdomen soft, non-distended, tenderness in RLQ, suprapubic, peri-umbilical, and epigastric regions; no peritoneal signs  Musculoskeletal: Normal range of motion.  Neurological: She is alert and oriented to person, place, and time.  Skin: Skin is warm and dry. She is not diaphoretic.  Psychiatric: She has a normal mood and affect.  Nursing note and vitals reviewed.  ED Course  Procedures (including critical care time) Labs Review Labs Reviewed  CBC WITH DIFFERENTIAL - Abnormal; Notable for the following:    Neutrophils Relative % 40 (*)    Lymphocytes Relative 49 (*)    All other components within normal limits  COMPREHENSIVE METABOLIC PANEL - Abnormal; Notable for the following:    Glucose, Bld 158 (*)    GFR calc non Af Amer 89 (*)    All other components within normal limits  URINALYSIS, ROUTINE W REFLEX MICROSCOPIC - Abnormal; Notable for the following:    Color, Urine AMBER (*)    APPearance CLOUDY (*)    Bilirubin Urine SMALL (*)    Ketones, ur 15 (*)    Leukocytes, UA TRACE (*)    All other components within normal limits  URINE MICROSCOPIC-ADD ON - Abnormal; Notable for the following:    Squamous Epithelial / LPF MANY (*)    Casts HYALINE CASTS (*)    Crystals CA OXALATE CRYSTALS (*)    All other components within normal limits  LIPASE, BLOOD    Imaging Review No results found.   EKG Interpretation None      MDM   Final diagnoses:  Abdominal pain  Abdominal pain   63 y.o. F with abdominal pain, worse RLQ.  No associated N/V/D.  BM's regular, no melena or hematochezia.  Abdominal exam  with diffuse tenderness, worse RLQ.  Currently afebrile and non-toxic in appearance.  On review of medical records, patient does have hx of frequent UTI's however no current urinary symptoms.  12:19 PM Lab work overall reassuring.  U/a appears contaminated (trace leuks, many squamous).  Patient re-evaluated, states pain improved while lying still but once she starts moving becomes intense again, worse in RLQ.  Will obtain CT abdomen pelvis w/contrast for further evaluation.  Offered additional meds, patient declined.  3:29 PM CT abdomen/pelvis pending.  Care signed out to PA [place] at shift change. Will follow CT results and dispo accordingly.  Larene Pickett, PA-C 08/16/14 [place], MD 08/16/14 [phone]

## 2014-08-16 NOTE — ED Notes (Signed)
Completed contrast, ct aware.

## 2014-08-16 NOTE — ED Notes (Signed)
Patient transported to CT 

## 2014-09-09 ENCOUNTER — Emergency Department (HOSPITAL_COMMUNITY)
Admission: EM | Admit: 2014-09-09 | Discharge: 2014-09-09 | Disposition: A | Discharge status: Home / Self Care | Payer: Medicaid Other | Attending: Emergency Medicine | Admitting: Emergency Medicine

## 2014-09-09 ENCOUNTER — Encounter (HOSPITAL_COMMUNITY): Payer: Self-pay

## 2014-09-09 DIAGNOSIS — Z853 Personal history of malignant neoplasm of breast: Secondary | ICD-10-CM | POA: Insufficient documentation

## 2014-09-09 DIAGNOSIS — I252 Old myocardial infarction: Secondary | ICD-10-CM | POA: Diagnosis not present

## 2014-09-09 DIAGNOSIS — Z951 Presence of aortocoronary bypass graft: Secondary | ICD-10-CM | POA: Insufficient documentation

## 2014-09-09 DIAGNOSIS — Z87891 Personal history of nicotine dependence: Secondary | ICD-10-CM | POA: Diagnosis not present

## 2014-09-09 DIAGNOSIS — I509 Heart failure, unspecified: Secondary | ICD-10-CM | POA: Diagnosis not present

## 2014-09-09 DIAGNOSIS — E785 Hyperlipidemia, unspecified: Secondary | ICD-10-CM | POA: Insufficient documentation

## 2014-09-09 DIAGNOSIS — Z86010 Personal history of colonic polyps: Secondary | ICD-10-CM | POA: Insufficient documentation

## 2014-09-09 DIAGNOSIS — Z79899 Other long term (current) drug therapy: Secondary | ICD-10-CM | POA: Diagnosis not present

## 2014-09-09 DIAGNOSIS — I10 Essential (primary) hypertension: Secondary | ICD-10-CM | POA: Insufficient documentation

## 2014-09-09 DIAGNOSIS — J45909 Unspecified asthma, uncomplicated: Secondary | ICD-10-CM | POA: Insufficient documentation

## 2014-09-09 DIAGNOSIS — G8929 Other chronic pain: Secondary | ICD-10-CM | POA: Insufficient documentation

## 2014-09-09 DIAGNOSIS — N3001 Acute cystitis with hematuria: Secondary | ICD-10-CM | POA: Insufficient documentation

## 2014-09-09 DIAGNOSIS — M1990 Unspecified osteoarthritis, unspecified site: Secondary | ICD-10-CM | POA: Diagnosis not present

## 2014-09-09 DIAGNOSIS — Z7982 Long term (current) use of aspirin: Secondary | ICD-10-CM | POA: Diagnosis not present

## 2014-09-09 DIAGNOSIS — E119 Type 2 diabetes mellitus without complications: Secondary | ICD-10-CM | POA: Insufficient documentation

## 2014-09-09 DIAGNOSIS — I2510 Atherosclerotic heart disease of native coronary artery without angina pectoris: Secondary | ICD-10-CM | POA: Insufficient documentation

## 2014-09-09 DIAGNOSIS — N390 Urinary tract infection, site not specified: Secondary | ICD-10-CM

## 2014-09-09 DIAGNOSIS — R52 Pain, unspecified: Secondary | ICD-10-CM | POA: Diagnosis present

## 2014-09-09 DIAGNOSIS — R319 Hematuria, unspecified: Secondary | ICD-10-CM

## 2014-09-09 HISTORY — DX: Reserved for inherently not codable concepts without codable children: IMO0001

## 2014-09-09 HISTORY — DX: Dorsalgia, unspecified: M54.9

## 2014-09-09 HISTORY — DX: Other chronic pain: G89.29

## 2014-09-09 HISTORY — DX: Sciatica, unspecified side: M54.30

## 2014-09-09 LAB — URINE MICROSCOPIC-ADD ON

## 2014-09-09 LAB — URINALYSIS, ROUTINE W REFLEX MICROSCOPIC
BILIRUBIN URINE: NEGATIVE
Glucose, UA: NEGATIVE mg/dL
KETONES UR: NEGATIVE mg/dL
Nitrite: NEGATIVE
PROTEIN: 30 mg/dL — AB
Specific Gravity, Urine: 1.026 (ref 1.005–1.030)
Urobilinogen, UA: 1 mg/dL (ref 0.0–1.0)
pH: 6 (ref 5.0–8.0)

## 2014-09-09 MED ORDER — CEPHALEXIN 500 MG PO CAPS: 500.0000 mg | ORAL_CAPSULE | Freq: Four times a day (QID) | ORAL | Status: DC

## 2014-09-09 MED ORDER — CEFTRIAXONE SODIUM 1 G IJ SOLR
1.0000 g | Freq: Once | INTRAMUSCULAR | Status: AC
  Administered 2014-09-09: 1 g via INTRAMUSCULAR
  Filled 2014-09-09: qty 10

## 2014-09-09 MED ORDER — LIDOCAINE HCL (PF) 1 % IJ SOLN
5.0000 mL | Freq: Once | INTRAMUSCULAR | Status: AC
  Administered 2014-09-09: 5 mL
  Filled 2014-09-09: qty 5

## 2014-09-09 NOTE — ED Provider Notes (Signed)
CSN: [id number]     Arrival date & time 09/09/14  8563 History   First MD Initiated Contact with Patient 09/09/14 0932     Chief Complaint  Patient presents with  . Generalized Body Aches  . Hematuria      HPI Pt was seen at 0935. Per pt, c/o gradual onset and persistence of constant dysuria, urinary frequency and hematuria for the past 2 days. Has been associated with suprapubic discomfort and generalized body aches. Denies flank pain, no fevers, no abd pain, no N/V/D, no vaginal bleeding/discharge, no rash.    Past Medical History  Diagnosis Date  . Hx of CABG     2007  . Hypertension   . Diabetes mellitus   . Ejection fraction     EF 65%, echo, [place], Jan 16, 2011  . Dyslipidemia   . CAD (coronary artery disease)     Nuclear stress test Feb 11, 2011, EF 64%, no scar or ischemia    //         catheterization, November, 2011,patent LIMA-LAD-small caliber distal vessel with diffuse plaque, patent SVG to OM1 and OM 2, patent SVG to PDA and PLA, occluded SVG to diagonal, medical therapy;  Lexiscan Myoview 6/14:  Inferior thinning, no ischemia, EF 61%, low risk  . Overweight(278.02)     stomach stapling 1985 followed by weight loss, then return of weight  . Tobacco abuse     in the past, resolved  . Alcohol abuse     in the past, resolved  . Nausea & vomiting     hospitalization, November, 2011, stricture GE junction, questionable stenosis gastrojejunostomy, disruptive primary peristaltic wave, GE reflux, delayed emptying proximal gastric pouch  . Hx of colonic polyps   . Asthma   . Myocardial infarction   . Breast cancer     Double mastectomy 2007; s/p tamoxifen and arimidex therapy; no recurrence since 05/2008  . Sleep apnea     CPAP being arranged May, 2011//does not use CPAP  . Arthritis   . Blood transfusion without reported diagnosis 1984    after surgery  . Hyperlipidemia   . CHF (congestive heart failure)   . Chronic back pain   . Sciatica   . Normal cardiac  stress test 02/2013   Past Surgical History  Procedure Laterality Date  . Coronary artery bypass graft  2009  . Mastectomy Bilateral 1987  . Coronary stent placement    . Abdominal hysterectomy  1989  . Gastric bypass  1983   Family History  Problem Relation Age of Onset  . Hypertension Mother   . Heart disease Mother   . Diabetes Mother   . Heart disease Father   . Colon cancer Father     does not know age of onset  . Heart disease Sister   . Colon cancer Sister 3  . Stomach cancer Sister   . Asthma Sister   . Esophageal cancer Neg Hx   . Rectal cancer Neg Hx    History  Substance Use Topics  . Smoking status: Former Smoker -- 3.00 packs/day for 27 years    Types: Cigarettes    Quit date: 09/14/2000  . Smokeless tobacco: Never Used  . Alcohol Use: No     Comment: History of alcohol abuse but quit 5 years ago    Review of Systems ROS: Statement: All systems negative except as marked or noted in the HPI; Constitutional: Negative for fever and chills. +generalized body aches.; ; Eyes:  Negative for eye pain, redness and discharge. ; ; ENMT: Negative for ear pain, hoarseness, nasal congestion, sinus pressure and sore throat. ; ; Cardiovascular: Negative for chest pain, palpitations, diaphoresis, dyspnea and peripheral edema. ; ; Respiratory: Negative for cough, wheezing and stridor. ; ; Gastrointestinal: Negative for nausea, vomiting, diarrhea, blood in stool, hematemesis, jaundice and rectal bleeding. . ; ; Genitourinary: +hematuria, dysuria. Negative for flank pain. ; ; Musculoskeletal: Negative for back pain and neck pain. Negative for swelling and trauma.; ; Skin: Negative for pruritus, rash, abrasions, blisters, bruising and skin lesion.; ; Neuro: Negative for headache, lightheadedness and neck stiffness. Negative for weakness, altered level of consciousness , altered mental status, extremity weakness, paresthesias, involuntary movement, seizure and syncope.      Allergies   Review of patient's allergies indicates no known allergies.  Home Medications   Prior to Admission medications   Medication Sig Start Date End Date Taking? Authorizing Provider  acetaminophen (TYLENOL) 325 MG tablet Take 650 mg by mouth every 6 (six) hours as needed for mild pain.    Historical Provider, MD  acetaminophen-codeine 120-12 MG/5ML solution Take 10 mLs by mouth every 4 (four) hours as needed for moderate pain. Patient not taking: Reported on 07/26/2014 05/17/14   Resa Miner Lawyer, PA-C  albuterol (PROVENTIL HFA;VENTOLIN HFA) 108 (90 BASE) MCG/ACT inhaler Inhale 2 puffs into the lungs every 6 (six) hours as needed for shortness of breath.     Historical Provider, MD  amoxicillin-clavulanate (AUGMENTIN) 500-125 MG per tablet Take 1 tablet (500 mg total) by mouth every 8 (eight) hours. Patient not taking: Reported on 08/16/2014 07/26/14   Linus Mako, PA-C  aspirin EC 81 MG tablet Take 81 mg by mouth every other day.     Historical Provider, MD  atorvastatin (LIPITOR) 40 MG tablet Take 1 tablet (40 mg total) by mouth daily. 05/11/14   [name], MD  flintstones complete (FLINTSTONES) 60 MG chewable tablet Chew 2 tablets by mouth daily.    Historical Provider, MD  HYDROcodone-acetaminophen (NORCO/VICODIN) 5-325 MG per tablet Take 1 tablet by mouth every 4 (four) hours as needed for moderate pain. 04/10/14   Hoy Morn, MD  lisinopril-hydrochlorothiazide (PRINZIDE,ZESTORETIC) 20-12.5 MG per tablet Take 1 tablet by mouth daily.      Historical Provider, MD  nitroGLYCERIN (NITROSTAT) 0.4 MG SL tablet Place 1 tablet (0.4 mg total) under the tongue every 5 (five) minutes as needed for chest pain. 05/11/14   [name], MD  phenazopyridine (PYRIDIUM) 95 MG tablet Take 1 tablet (95 mg total) by mouth 3 (three) times daily. Patient not taking: Reported on 07/26/2014 06/20/14   Carmin Muskrat, MD  RABEprazole (ACIPHEX) 20 MG tablet Take 20 mg by mouth daily.    Historical  Provider, MD   BP 118/67 mmHg  Pulse 71  Temp(Src) 98.2 F (36.8 C) (Oral)  Resp 18  Ht 4\' 11"  (1.499 m)  Wt 215 lb (97.523 kg)  BMI 43.40 kg/m2  SpO2 97% Physical Exam  0940: Physical examination:  Nursing notes reviewed; Vital signs and O2 SAT reviewed;  Constitutional: Well developed, Well nourished, Well hydrated, In no acute distress; Head:  Normocephalic, atraumatic; Eyes: EOMI, PERRL, No scleral icterus; ENMT: Mouth and pharynx normal, Mucous membranes moist; Neck: Supple, Full range of motion, No lymphadenopathy; Cardiovascular: Regular rate and rhythm, No gallop; Respiratory: Breath sounds clear & equal bilaterally, No wheezes.  Speaking full sentences with ease, Normal respiratory effort/excursion; Chest: Nontender, Movement normal; Abdomen: Soft, +mild  suprapubic tenderness to palp. No rebound or guarding. Nondistended, Normal bowel sounds; Genitourinary: No CVA tenderness; Extremities: Pulses normal, No tenderness, No edema, No calf edema or asymmetry.; Neuro: AA&Ox3, Major CN grossly intact.  Speech clear. No gross focal motor or sensory deficits in extremities. Climbs on and off stretcher easily by herself. Gait steady.; Skin: Color normal, Warm, Dry.   ED Course  Procedures     EKG Interpretation None      MDM  MDM Reviewed: previous chart, nursing note and vitals Reviewed previous: CT scan and labs Interpretation: labs    Ct Abdomen Pelvis W Contrast 08/16/2014   CLINICAL DATA:  Acute Sharp right lower quadrant abdominal pain for 2 days.  EXAM: CT ABDOMEN AND PELVIS WITH CONTRAST  TECHNIQUE: Multidetector CT imaging of the abdomen and pelvis was performed using the standard protocol following bolus administration of intravenous contrast.  CONTRAST:  175mL OMNIPAQUE IOHEXOL 300 MG/ML  SOLN  COMPARISON:  06/20/2014  FINDINGS: Lower chest: minor anterior right middle lobe and lingula scarring or atelectasis. Chronic right base atelectasis also noted. No lower lobe  pneumonia or acute process. Normal heart size. Prior coronary bypass noted. No pericardial or pleural effusion. No significant hiatal hernia. Chronic elevation of the right hemidiaphragm.  Abdomen: Postop changes of the stomach.  Liver, gallbladder, biliary system, pancreas, spleen, adrenal glands, and kidneys are within normal limits for age and demonstrate no acute process.  Negative for bowel obstruction, dilatation, ileus, or free air. Normal appendix demonstrated.  Aortic atherosclerosis noted without aneurysm or occlusive process.  Postop changes of the midline abdominal wall. No evidence of a ventral hernia or inguinal hernia.  Pelvis: Prior hysterectomy noted. Urinary bladder unremarkable. Adnexal normal in size. No acute distal bowel process. No pelvic free fluid, fluid collection, hemorrhage, abscess, or adenopathy.  Diffuse degenerative changes of the thoracolumbar spine. No acute osseous finding.  IMPRESSION: Stable exam of the abdomen and pelvis. No acute intra-abdominal or pelvic finding.   Electronically Signed   By: Daryll Brod M.D.   On: 08/16/2014 16:23    Results for orders placed or performed during the hospital encounter of 09/09/14  Urinalysis, Routine w reflex microscopic  Result Value Ref Range   Color, Urine AMBER (A) YELLOW   APPearance CLOUDY (A) CLEAR   Specific Gravity, Urine 1.026 1.005 - 1.030   pH 6.0 5.0 - 8.0   Glucose, UA NEGATIVE NEGATIVE mg/dL   Hgb urine dipstick MODERATE (A) NEGATIVE   Bilirubin Urine NEGATIVE NEGATIVE   Ketones, ur NEGATIVE NEGATIVE mg/dL   Protein, ur 30 (A) NEGATIVE mg/dL   Urobilinogen, UA 1.0 0.0 - 1.0 mg/dL   Nitrite NEGATIVE NEGATIVE   Leukocytes, UA MODERATE (A) NEGATIVE  Urine microscopic-add on  Result Value Ref Range   Squamous Epithelial / LPF FEW (A) RARE   WBC, UA 21-50 <3 WBC/hpf   RBC / HPF 7-10 <3 RBC/hpf   Bacteria, UA FEW (A) RARE    1140:  +UTI, UC pending. Will dose IM rocephin, rx keflex. Pt has ambulated with  steady gait, easy resps, NAD. No N/V while in the ED. States she wants to go home now. Dx and testing d/w pt.  Questions answered.  Verb understanding, agreeable to d/c home with outpt f/u.      Francine Graven, DO 09/12/14 [phone]

## 2014-09-09 NOTE — ED Notes (Signed)
Pt states she started having body aches yesterday, has noticed blood in her urine and has been going more frequently. Denies any nausea or vomiting.

## 2014-09-09 NOTE — Discharge Instructions (Signed)
°Emergency Department Resource Guide °1) Find a Doctor and Pay Out of Pocket °Although you won't have to find out who is covered by your insurance plan, it is a good idea to ask around and get recommendations. You will then need to call the office and see if the doctor you have chosen will accept you as a new patient and what types of options they offer for patients who are self-pay. Some doctors offer discounts or will set up payment plans for their patients who do not have insurance, but you will need to ask so you aren't surprised when you get to your appointment. ° °2) Contact Your Local Health Department °Not all health departments have doctors that can see patients for sick visits, but many do, so it is worth a call to see if yours does. If you don't know where your local health department is, you can check in your phone book. The CDC also has a tool to help you locate your state's health department, and many state websites also have listings of all of their local health departments. ° °3) Find a Walk-in Clinic °If your illness is not likely to be very severe or complicated, you may want to try a walk in clinic. These are popping up all over the country in pharmacies, drugstores, and shopping centers. They're usually staffed by nurse practitioners or physician assistants that have been trained to treat common illnesses and complaints. They're usually fairly quick and inexpensive. However, if you have serious medical issues or chronic medical problems, these are probably not your best option. ° °No Primary Care Doctor: °- Call [facility] at  832-8000 - they can help you locate a primary care doctor that  accepts your insurance, provides certain services, etc. °- Physician Referral Service- 1-800-533-3463 ° °Chronic Pain Problems: °Organization         Address  Phone   Notes  °[facility]  (336) 297-2271 Patients need to be referred by their primary care doctor.  ° °Medication  Assistance: °Organization         Address  Phone   Notes  °[place] [facility] [address] °[place], [place] [address] (336) 641-8030 --Must be a resident of [place] °-- Must have NO insurance coverage whatsoever (no Medicaid/ Medicare, etc.) °-- The pt. MUST have a primary care doctor that directs their care regularly and follows them in the community °  °[facility]  (866) 331-1348   °[employer]  (888) 892-1162   ° °Agencies that provide inexpensive medical care: °Organization         Address  Phone   Notes  °[facility]  (336) 832-8035   °[facility]    (336) 832-7272   °[facility] [address] °[place], [place] [address] (336) 832-4777   °[facility] [address], °[place] (336) 271-4999   °[facility]    (336) 373-0678   °[facility]    (336) 272-1050   °[facility] ° [address], [place] Phone:  (336) 832-4444, Fax:  (336) 832-4440 Hours of Operation:  9 am - 6 pm, M-F.  Also accepts Medicaid/Medicare and self-pay.  °[facility] ° [address], [place] Phone: (336) [phone], Fax: (336) [phone]. Hours of Operation:  8:30 am - 5:30 pm, M-F.  Also accepts Medicaid and self-pay.  °[facility] [place] [address], [place] Phone: (336) 878-6027   °[facility] [address], [place], [place] (336)723-1848, Ext. 123 Mondays & Thursdays: 7-9 AM.  First 15 patients are seen on a first come, first serve basis. °  ° °Medicaid-accepting [place] Providers: ° °Organization         Address  Phone   Notes  °[facility] [address], [place] (336) 641-2100 Also accepts self-pay patients.  °[facility] [address], [place] ° (336) 856-9996   °[facility] [address], [place]  (336) 288-8857   °[facility] [address] [place] [address], [place] (336) 299-7000   °Veita Bland [address], [place]  ° (336) 373-1557 Only accepts [place] Access Medicaid patients after they have their name applied to their card.  ° °Self-Pay (no insurance) in [place]: ° °Organization         Address  Phone   Notes  °Sickle Cell Patients, [facility] [address], [place] (336) 832-1970   °[facility] [address], [place] (336) 832-4400   °[facility] ° [address], [place] (336) 992-4800   °[facility]/Dr. Osei-Bonsu ° [address] [place] [address], [place] or [address], [place] (336) [phone] Phone number for both [place] and [place] locations is the same.  °[facility] [address], [place] (336) 299-0000   °[facility] [address] [place] [address], [place] or [address] (336) 852-7530 °(336) 878-2260   °[facility] [address], [place] (336) 350-1642, phone; (336) 294-5005, fax Sees patients 1st and 3rd Saturday of every month.  Must not qualify for public or private insurance (i.e. Medicaid, Medicare, [place] Health Choice, Veterans' Benefits) • Household income should be no more than 200% of the poverty level •The clinic cannot treat you if you are pregnant or think you are pregnant • Sexually transmitted diseases are not treated at the clinic.  ° ° °Dental Care: °Organization         Address  Phone  Notes  °[place] [facility] [address], [place] (336) 641-6152 Accepts children up to age 21 who are enrolled in Medicaid or [place] Health Choice; pregnant women with a Medicaid card; and children who have applied for Medicaid or [place] Health Choice, but were declined, whose parents can pay a reduced fee at time of service.  °[place]  [facility] [place]  [address], [place] (336) 641-7733 Accepts children up to age 21 who are enrolled in Medicaid or [place] Health Choice; pregnant women with a Medicaid card; and children who have applied for Medicaid or [place] Health Choice, but were declined, whose parents can pay a reduced fee at time of service.  °[facility] ° [address], [place] (336) 641-4533 Patients are seen by appointment only. Walk-ins are not accepted. [facility] will see patients 18 years of age and older. °Monday - Tuesday (8am-5pm) °Most Wednesdays (8:30-5pm) °$30 per visit, cash only  °[facility] ° [address], [place] (336) 641-4533 Patients are seen by appointment only. Walk-ins are not accepted. [facility] will see patients 18 years of age and older. °One   Wednesday Evening (Monthly: Volunteer Based).  $30 per visit, cash only  °[facility]  (919) 537-3737 for adults; Children under age 4, call Graduate Pediatric Dentistry at ([phone]) [phone]. Children aged 4-14, please call ([phone]) [phone] to request a pediatric application. ° Dental services are provided in all areas of dental care including fillings, crowns and bridges, complete and partial dentures, implants, gum treatment, root canals, and extractions. Preventive care is also provided. Treatment is provided to both adults and children. °Patients are selected via a lottery and there is often a waiting list. °  °[facility] [address], °[place] ° (336) 763-8833 [email or web address] °  °[facility] [address], [place], [place] (336)723-1848, Ext. 123 Second and Fourth Thursday of each month, opens at 6:30 AM; Clinic ends at 9 AM.  Patients are seen on a first-come first-served basis, and a limited number are seen during each clinic.  ° °[facility] ° [address], [place], [place] (336) 723-7904    Eligibility Requirements °You must have lived in [place], [place], or [place] counties for at least the last three months. °  You cannot be eligible for state or federal sponsored healthcare insurance, including Veterans Administration, Medicaid, or Medicare. °  You generally cannot be eligible for healthcare insurance through your employer.  °  How to apply: °Eligibility screenings are held every Tuesday and Wednesday afternoon from 1:00 pm until 4:00 pm. You do not need an appointment for the interview!  °[facility] [address], [place], [place] 336-631-2330   °[facility]  336-342-8273   °[facility]  336-703-3100   °[facility]  336-570-6415   ° °Behavioral Health Resources in the Community: °Intensive Outpatient Programs °Organization         Address  Phone  Notes  °[facility] [address], [place], [place] 336-878-6098   °[facility] [address], [place], [place] 336-832-9800   °[facility] [address], [place], [place] ° 336-882-2125   °[facility] [address],  °[place], [place] 1-800-853-5163 or 336-641-4981   °Substance Abuse Resources °Organization         Address  Phone  Notes  °[facility]  336-882-2125   °[facility]  336-784-9470   °[facility]  336-285-9073   °[facility]  336-845-3988   °Residential & Outpatient Substance Abuse Program  1-800-659-3381   °Psychological Services °Organization         Address  Phone  Notes  °[facility]  336- 832-9600   °[facility]  336- 378-7881   °[facility] [address], [place] 1-800-853-5163 or 336-641-4981   ° °Mobile Crisis Teams °Organization         Address  Phone  Notes  °[facility], Mobile Crisis Care Unit  1-877-626-1772   °Assertive °[facility] ° [address]  [place], [place] 336-834-9664   °Sharon DeEsch [address] °[place] 336-554-5454   ° °Self-Help/Support Groups °Organization         Address  Phone             Notes  °[facility] - variety of support groups  336- 373-1402 Call for more information  °Narcotics Anonymous (NA), [facility] [address], °[place]   2 meetings at this location  ° °  Residential Treatment Programs Organization         Address  Phone  Notes  [facility] [address]    [place]  1-208 564 7963   [facility]  [address], [place] [address], [place], [place]   [place], [place] (385)[phone] Admissions: 8am-3pm M-F  [facility] [address]    [place], [place] [phone]   [facility] [address] [place], [place], [place]   [facility] [address],  [place], [place]   [facility] - Intensive Outpatient [address], [place], [place]   [facility] [place]  [place], [place] 1-401-074-1243 or (469)[phone]   [facility] [address] [place], [place] Accepts Medicaid  [facility] [address],  [place] 1-316-617-5321 Substance Abuse/Addiction Treatment   [facility]         Address  Phone  Notes  [facility]  [phone]   Domenic Schwab, PhD [address] Arlis Porta [place], [place]   [phone] or [phone]   [place], [place] (204)[phone]   [facility] [address], [place], [place] [phone] Insurance/Medicaid/sponsorship through [facility] [address]                                    [place], [place] [phone] [place] [address], [place] [phone]    Dr. Adele Schilder  [phone]   [facility] Dept. 1) [address] [place] 2) [place] 3)  [address], [place] [phone]  [phone]   [place] ([phone] or [phone] (After Hours)      Take the prescription as directed.  Call your regular medical doctor tomorrow to schedule a follow up appointment within the next 2 days.  Return to the Emergency Department immediately sooner if worsening.

## 2014-09-10 LAB — URINE CULTURE: Colony Count: 3000

## 2014-10-22 ENCOUNTER — Ambulatory Visit: Payer: Medicaid Other | Admitting: Cardiology

## 2014-10-24 ENCOUNTER — Encounter: Payer: Self-pay | Admitting: Cardiology

## 2014-11-12 ENCOUNTER — Encounter: Payer: Self-pay | Admitting: Physician Assistant

## 2014-11-12 ENCOUNTER — Ambulatory Visit (INDEPENDENT_AMBULATORY_CARE_PROVIDER_SITE_OTHER): Payer: Medicaid Other | Admitting: Physician Assistant

## 2014-11-12 VITALS — BP 110/64 | HR 86 | Ht 59.0 in | Wt 216.0 lb

## 2014-11-12 DIAGNOSIS — I25708 Atherosclerosis of coronary artery bypass graft(s), unspecified, with other forms of angina pectoris: Secondary | ICD-10-CM

## 2014-11-12 DIAGNOSIS — I10 Essential (primary) hypertension: Secondary | ICD-10-CM

## 2014-11-12 DIAGNOSIS — R079 Chest pain, unspecified: Secondary | ICD-10-CM | POA: Insufficient documentation

## 2014-11-12 DIAGNOSIS — R0789 Other chest pain: Secondary | ICD-10-CM

## 2014-11-12 MED ORDER — METOPROLOL TARTRATE 25 MG PO TABS: 25.0000 mg | ORAL_TABLET | Freq: Every day | ORAL | Status: DC

## 2014-11-12 MED ORDER — ISOSORBIDE MONONITRATE ER 30 MG PO TB24: 30.0000 mg | ORAL_TABLET | Freq: Every day | ORAL | Status: DC

## 2014-11-12 MED ORDER — NITROGLYCERIN 0.4 MG SL SUBL: 0.4000 mg | SUBLINGUAL_TABLET | SUBLINGUAL | Status: DC | PRN

## 2014-11-12 NOTE — Patient Instructions (Addendum)
Your physician has recommended you make the following change in your medication:   START TAKING METOPROLOL 25MG  ONCE A DAY   START TAKING IMDUR 30MG  ONCE A DAY     Your physician has requested that you have a lexiscan myoview. For further information please visit [email or web address]. Please follow instruction sheet, as given.    FOLLOW  UP WITH DR Ron Parker IN ONE MONTH

## 2014-11-12 NOTE — Assessment & Plan Note (Signed)
Patient has history of CAD status post CABG with last cath in 2011 demonstrating occluded SVG to the diagonal all other grafts were patent and preserved LV function see above. Patient is having recurrent chest pain somewhat typical and atypical features. Order Lexi scan Myoview.

## 2014-11-12 NOTE — Assessment & Plan Note (Signed)
Patient comes in with 2 week history of chest pain with some typical and atypical features. She does have history of coronary disease with multiple cardiac risk factors. I will add Imdur were 30 mg once daily, Toprol-XL 25 mg once daily, order a Lexi scan Myoview to rule out ischemia. Follow-up with Dr. Ron Parker in 3-4 weeks.
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
## 2014-11-12 NOTE — Assessment & Plan Note (Signed)
Blood pressure is stable 

## 2014-11-30 ENCOUNTER — Ambulatory Visit (HOSPITAL_COMMUNITY): Payer: Medicaid Other | Attending: Physician Assistant | Admitting: Radiology

## 2014-11-30 DIAGNOSIS — R0789 Other chest pain: Secondary | ICD-10-CM | POA: Diagnosis present

## 2014-11-30 DIAGNOSIS — I25708 Atherosclerosis of coronary artery bypass graft(s), unspecified, with other forms of angina pectoris: Secondary | ICD-10-CM | POA: Diagnosis not present

## 2014-11-30 MED ORDER — TECHNETIUM TC 99M SESTAMIBI GENERIC - CARDIOLITE
11.0000 | Freq: Once | INTRAVENOUS | Status: AC | PRN
  Administered 2014-11-30: 11 via INTRAVENOUS

## 2014-11-30 MED ORDER — REGADENOSON 0.4 MG/5ML IV SOLN
0.4000 mg | Freq: Once | INTRAVENOUS | Status: AC
  Administered 2014-11-30: 0.4 mg via INTRAVENOUS

## 2014-11-30 MED ORDER — TECHNETIUM TC 99M SESTAMIBI GENERIC - CARDIOLITE
33.0000 | Freq: Once | INTRAVENOUS | Status: AC | PRN
  Administered 2014-11-30: 33 via INTRAVENOUS

## 2014-11-30 NOTE — Progress Notes (Signed)
[facility] [address] [place], [place] [address] [phone]    Cardiology Nuclear Med Study  Patricia Cuevas is a 64 y.o. female     MRN : [id number]     DOB: July 10, 1951  Procedure Date: 11/30/2014  Nuclear Med Background Indication for Stress Test:  Evaluation for Ischemia History:  CAD, 2014 MPI-Normal EF 61% Cardiac Risk Factors: Hypertension and NIDDM  Symptoms:  Chest Pain   Nuclear Pre-Procedure Caffeine/Decaff Intake:  None NPO After: 8:00pm   Lungs:  clear O2 Sat: 97% on room air. IV 0.9% NS with Angio Cath:  24g  IV Site: R Hand  IV Started by:  Crissie Figures, RN  Chest Size (in):  48 Cup Size: Bilateral Mastectomy  Height: 4\' 11"  (1.499 m)  Weight:  214 lb (97.07 kg)  BMI:  Body mass index is 43.2 kg/(m^2). Tech Comments:  N/A    Nuclear Med Study 1 or 2 day study: 1 day  Stress Test Type:  Lexiscan  Reading MD: N/A  Order Authorizing Provider:  Dola Argyle, MD  Resting Radionuclide: Technetium 57m Sestamibi  Resting Radionuclide Dose: 11.0 mCi   Stress Radionuclide:  Technetium 61m Sestamibi  Stress Radionuclide Dose: 33.0 mCi           Stress Protocol Rest HR: 62 Stress HR: 100  Rest BP: 130/56 Stress BP: 157/83  Exercise Time (min): n/a METS: n/a   Predicted Max HR: 156 bpm % Max HR: 64.1 bpm Rate Pressure Product: 15800   Dose of Adenosine (mg):  n/a Dose of Lexiscan: 0.4 mg  Dose of Atropine (mg): n/a Dose of Dobutamine: n/a mcg/kg/min (at max HR)  Stress Test Technologist: Crissie Figures, RN  Nuclear Technologist:  Annye Rusk, CNMT     Rest Procedure:  Myocardial perfusion imaging was performed at rest 45 minutes following the intravenous administration of Technetium 79m Sestamibi. Rest ECG: NSR with non-specific ST-T wave changes  Stress Procedure:  The patient received IV Lexiscan 0.4 mg over 15-seconds.  Technetium 72m Sestamibi injected at 30-seconds.  Quantitative spect images were obtained after a 45  minute delay. Stress ECG: No significant change from baseline ECG  QPS Raw Data Images:  Normal; no motion artifact; normal heart/lung ratio. Stress Images:  Normal homogeneous uptake in all areas of the myocardium. Rest Images:  Normal homogeneous uptake in all areas of the myocardium. Subtraction (SDS):  Normal Transient Ischemic Dilatation (Normal <1.22):  1.00 Lung/Heart Ratio (Normal <0.45):  0.34  Quantitative Gated Spect Images QGS EDV:  73 ml QGS ESV:  26 ml  Impression Exercise Capacity:  Lexiscan with no exercise. BP Response:  Normal blood pressure response. Clinical Symptoms:  Stomach pain ECG Impression:  No significant ST segment change suggestive of ischemia. Comparison with Prior Nuclear Study: No images to compare  Overall Impression:  Normal stress nuclear study.  LV Ejection Fraction: 64%.  LV Wall Motion:  NL LV Function; NL Wall Motion  [place]

## 2014-12-01 ENCOUNTER — Emergency Department (HOSPITAL_COMMUNITY): Payer: Medicaid Other

## 2014-12-01 ENCOUNTER — Other Ambulatory Visit: Payer: Self-pay

## 2014-12-01 ENCOUNTER — Encounter (HOSPITAL_COMMUNITY): Payer: Self-pay | Admitting: Emergency Medicine

## 2014-12-01 ENCOUNTER — Emergency Department (HOSPITAL_COMMUNITY)
Admission: EM | Admit: 2014-12-01 | Discharge: 2014-12-01 | Disposition: A | Discharge status: Home / Self Care | Payer: Medicaid Other | Attending: Emergency Medicine | Admitting: Emergency Medicine

## 2014-12-01 DIAGNOSIS — R079 Chest pain, unspecified: Secondary | ICD-10-CM | POA: Insufficient documentation

## 2014-12-01 DIAGNOSIS — Z853 Personal history of malignant neoplasm of breast: Secondary | ICD-10-CM | POA: Insufficient documentation

## 2014-12-01 DIAGNOSIS — Z7982 Long term (current) use of aspirin: Secondary | ICD-10-CM | POA: Diagnosis not present

## 2014-12-01 DIAGNOSIS — G4730 Sleep apnea, unspecified: Secondary | ICD-10-CM | POA: Diagnosis not present

## 2014-12-01 DIAGNOSIS — G8929 Other chronic pain: Secondary | ICD-10-CM | POA: Diagnosis not present

## 2014-12-01 DIAGNOSIS — I252 Old myocardial infarction: Secondary | ICD-10-CM | POA: Diagnosis not present

## 2014-12-01 DIAGNOSIS — E119 Type 2 diabetes mellitus without complications: Secondary | ICD-10-CM | POA: Insufficient documentation

## 2014-12-01 DIAGNOSIS — I10 Essential (primary) hypertension: Secondary | ICD-10-CM | POA: Diagnosis not present

## 2014-12-01 DIAGNOSIS — R42 Dizziness and giddiness: Secondary | ICD-10-CM | POA: Diagnosis present

## 2014-12-01 DIAGNOSIS — I2510 Atherosclerotic heart disease of native coronary artery without angina pectoris: Secondary | ICD-10-CM | POA: Insufficient documentation

## 2014-12-01 DIAGNOSIS — Z87891 Personal history of nicotine dependence: Secondary | ICD-10-CM | POA: Insufficient documentation

## 2014-12-01 DIAGNOSIS — Z951 Presence of aortocoronary bypass graft: Secondary | ICD-10-CM | POA: Insufficient documentation

## 2014-12-01 DIAGNOSIS — Z79899 Other long term (current) drug therapy: Secondary | ICD-10-CM | POA: Diagnosis not present

## 2014-12-01 DIAGNOSIS — E663 Overweight: Secondary | ICD-10-CM | POA: Insufficient documentation

## 2014-12-01 DIAGNOSIS — Z9981 Dependence on supplemental oxygen: Secondary | ICD-10-CM | POA: Insufficient documentation

## 2014-12-01 DIAGNOSIS — Z7952 Long term (current) use of systemic steroids: Secondary | ICD-10-CM | POA: Diagnosis not present

## 2014-12-01 DIAGNOSIS — Z86010 Personal history of colonic polyps: Secondary | ICD-10-CM | POA: Insufficient documentation

## 2014-12-01 DIAGNOSIS — Z9109 Other allergy status, other than to drugs and biological substances: Secondary | ICD-10-CM

## 2014-12-01 DIAGNOSIS — J3489 Other specified disorders of nose and nasal sinuses: Secondary | ICD-10-CM

## 2014-12-01 DIAGNOSIS — J45909 Unspecified asthma, uncomplicated: Secondary | ICD-10-CM | POA: Diagnosis not present

## 2014-12-01 DIAGNOSIS — R51 Headache: Secondary | ICD-10-CM | POA: Diagnosis not present

## 2014-12-01 DIAGNOSIS — Z792 Long term (current) use of antibiotics: Secondary | ICD-10-CM | POA: Insufficient documentation

## 2014-12-01 DIAGNOSIS — I509 Heart failure, unspecified: Secondary | ICD-10-CM | POA: Insufficient documentation

## 2014-12-01 DIAGNOSIS — R519 Headache, unspecified: Secondary | ICD-10-CM

## 2014-12-01 DIAGNOSIS — E785 Hyperlipidemia, unspecified: Secondary | ICD-10-CM | POA: Insufficient documentation

## 2014-12-01 LAB — I-STAT TROPONIN, ED: Troponin i, poc: 0 ng/mL (ref 0.00–0.08)

## 2014-12-01 LAB — CBC
HEMATOCRIT: 42.4 % (ref 36.0–46.0)
Hemoglobin: 13.6 g/dL (ref 12.0–15.0)
MCH: 28 pg (ref 26.0–34.0)
MCHC: 32.1 g/dL (ref 30.0–36.0)
MCV: 87.4 fL (ref 78.0–100.0)
PLATELETS: 167 10*3/uL (ref 150–400)
RBC: 4.85 MIL/uL (ref 3.87–5.11)
RDW: 15 % (ref 11.5–15.5)
WBC: 4.4 10*3/uL (ref 4.0–10.5)

## 2014-12-01 LAB — BASIC METABOLIC PANEL
ANION GAP: 8 (ref 5–15)
BUN: 5 mg/dL — ABNORMAL LOW (ref 6–23)
CHLORIDE: 106 mmol/L (ref 96–112)
CO2: 24 mmol/L (ref 19–32)
Calcium: 9.3 mg/dL (ref 8.4–10.5)
Creatinine, Ser: 0.75 mg/dL (ref 0.50–1.10)
GFR calc Af Amer: >90 mL/min (ref >90)
GFR calc non Af Amer: 88 mL/min — ABNORMAL LOW (ref >90)
Glucose, Bld: 142 mg/dL — ABNORMAL HIGH (ref 70–99)
POTASSIUM: 3.8 mmol/L (ref 3.5–5.1)
Sodium: 138 mmol/L (ref 135–145)

## 2014-12-01 MED ORDER — TETRACAINE HCL 0.5 % OP SOLN
1.0000 [drp] | Freq: Once | OPHTHALMIC | Status: AC
  Administered 2014-12-01: 1 [drp] via OPHTHALMIC
  Filled 2014-12-01: qty 2

## 2014-12-01 MED ORDER — METOCLOPRAMIDE HCL 5 MG/ML IJ SOLN
10.0000 mg | Freq: Once | INTRAMUSCULAR | Status: AC
  Administered 2014-12-01: 10 mg via INTRAVENOUS
  Filled 2014-12-01: qty 2

## 2014-12-01 MED ORDER — CETIRIZINE HCL 10 MG PO CAPS: 10.0000 mg | ORAL_CAPSULE | Freq: Every day | ORAL | Status: DC

## 2014-12-01 MED ORDER — DIPHENHYDRAMINE HCL 50 MG/ML IJ SOLN
25.0000 mg | Freq: Once | INTRAMUSCULAR | Status: AC
  Administered 2014-12-01: 25 mg via INTRAVENOUS
  Filled 2014-12-01: qty 1

## 2014-12-01 NOTE — ED Notes (Signed)
Patient transported to CT 

## 2014-12-01 NOTE — ED Provider Notes (Addendum)
CSN: [id number]     Arrival date & time 12/01/14  1139 History   First MD Initiated Contact with Patient 12/01/14 1146     Chief Complaint  Patient presents with  . Dizziness  . Headache  . mid Axillary pain    mid Axillary pain     (Consider location/radiation/quality/duration/timing/severity/associated sxs/prior Treatment) HPI Comments: Pt states for the last 2-3 weeks she has had sinus pain and pressure.  Nasal drainage and productive cough.  She completed a course of abx about 1 week ago without improvement.  Not currently using nasal spray or allergy medications.  Denies fever but productive cough with yellow sputum.  Pt states today she developed severe right sided headache with photophobia and blurred vision that shoots into her face and then down her neck and into her right chest.  After this happened then she felt lightheaded and palpitations.  Denies dizziness or palpitations now.  Only having HA.  Patient is a 64 y.o. female presenting with dizziness and headaches. The history is provided by the patient.  Dizziness Quality:  Lightheadedness Severity:  Moderate Onset quality:  Sudden Timing:  Constant Progression:  Resolved Chronicity:  New Context comment:  Started today when she had walked to the kitchen to make herself something to eat Relieved by: sitting down. Worsened by:  Nothing Ineffective treatments:  None tried Associated symptoms: headaches and palpitations   Associated symptoms comment:  When she felt dizzy it also caused to her feel palpitations and have some pain in the right side of her chest. Risk factors: heart disease   Headache Associated symptoms: dizziness     Past Medical History  Diagnosis Date  . Hx of CABG     2007  . Hypertension   . Diabetes mellitus   . Ejection fraction     EF 65%, echo, [place], Jan 16, 2011  . Dyslipidemia   . CAD (coronary artery disease)     Nuclear stress test Feb 11, 2011, EF 64%, no scar or ischemia    //          catheterization, November, 2011,patent LIMA-LAD-small caliber distal vessel with diffuse plaque, patent SVG to OM1 and OM 2, patent SVG to PDA and PLA, occluded SVG to diagonal, medical therapy;  Lexiscan Myoview 6/14:  Inferior thinning, no ischemia, EF 61%, low risk  . Overweight(278.02)     stomach stapling 1985 followed by weight loss, then return of weight  . Tobacco abuse     in the past, resolved  . Alcohol abuse     in the past, resolved  . Nausea & vomiting     hospitalization, November, 2011, stricture GE junction, questionable stenosis gastrojejunostomy, disruptive primary peristaltic wave, GE reflux, delayed emptying proximal gastric pouch  . Hx of colonic polyps   . Asthma   . Myocardial infarction   . Breast cancer     Double mastectomy 2007; s/p tamoxifen and arimidex therapy; no recurrence since 05/2008  . Sleep apnea     CPAP being arranged May, 2011//does not use CPAP  . Arthritis   . Blood transfusion without reported diagnosis 1984    after surgery  . Hyperlipidemia   . CHF (congestive heart failure)   . Chronic back pain   . Sciatica   . Normal cardiac stress test 02/2013   Past Surgical History  Procedure Laterality Date  . Coronary artery bypass graft  2009  . Mastectomy Bilateral 1987  . Coronary stent placement    .  Abdominal hysterectomy  1989  . Gastric bypass  1983   Family History  Problem Relation Age of Onset  . Hypertension Mother   . Heart disease Mother   . Diabetes Mother   . Heart disease Father   . Colon cancer Father     does not know age of onset  . Heart disease Sister   . Colon cancer Sister 19  . Stomach cancer Sister   . Asthma Sister   . Esophageal cancer Neg Hx   . Rectal cancer Neg Hx    History  Substance Use Topics  . Smoking status: Former Smoker -- 3.00 packs/day for 27 years    Types: Cigarettes    Quit date: 09/14/2000  . Smokeless tobacco: Never Used  . Alcohol Use: No     Comment: History of alcohol abuse  but quit 5 years ago   OB History    No data available     Review of Systems  Cardiovascular: Positive for palpitations.  Neurological: Positive for dizziness and headaches.  All other systems reviewed and are negative.     Allergies  Review of patient's allergies indicates no known allergies.  Home Medications   Prior to Admission medications   Medication Sig Start Date End Date Taking? Authorizing Provider  acetaminophen (TYLENOL) 325 MG tablet Take 650 mg by mouth every 6 (six) hours as needed for mild pain.    Historical Provider, MD  acetaminophen-codeine 120-12 MG/5ML solution Take 10 mLs by mouth every 4 (four) hours as needed for moderate pain. 05/17/14   Dalia Heading, PA-C  albuterol (PROVENTIL HFA;VENTOLIN HFA) 108 (90 BASE) MCG/ACT inhaler Inhale 2 puffs into the lungs every 6 (six) hours as needed for shortness of breath.     Historical Provider, MD  amoxicillin-clavulanate (AUGMENTIN) 500-125 MG per tablet Take 1 tablet (500 mg total) by mouth every 8 (eight) hours. 07/26/14   Delos Haring, PA-C  aspirin EC 81 MG tablet Take 81 mg by mouth every other day.     Historical Provider, MD  atorvastatin (LIPITOR) 40 MG tablet Take 1 tablet (40 mg total) by mouth daily. 05/11/14   [name], MD  cephALEXin (KEFLEX) 500 MG capsule Take 1 capsule (500 mg total) by mouth 4 (four) times daily. 09/09/14   Francine Graven, DO  flintstones complete (FLINTSTONES) 60 MG chewable tablet Chew 2 tablets by mouth daily.    Historical Provider, MD  HYDROcodone-acetaminophen (NORCO/VICODIN) 5-325 MG per tablet Take 1 tablet by mouth every 4 (four) hours as needed for moderate pain. 04/10/14   Jola Schmidt, MD  isosorbide mononitrate (IMDUR) 30 MG 24 hr tablet Take 1 tablet (30 mg total) by mouth daily. 11/12/14   [name], PA-C  lisinopril-hydrochlorothiazide (PRINZIDE,ZESTORETIC) 20-12.5 MG per tablet Take 1 tablet by mouth daily.      Historical Provider, MD  metoprolol  tartrate (LOPRESSOR) 25 MG tablet Take 1 tablet (25 mg total) by mouth daily. 11/12/14   [name], PA-C  nitroGLYCERIN (NITROSTAT) 0.4 MG SL tablet Place 1 tablet (0.4 mg total) under the tongue every 5 (five) minutes as needed for chest pain. 11/12/14   [name], PA-C  phenazopyridine (PYRIDIUM) 95 MG tablet Take 1 tablet (95 mg total) by mouth 3 (three) times daily. 06/20/14   Carmin Muskrat, MD  RABEprazole (ACIPHEX) 20 MG tablet Take 20 mg by mouth daily.    Historical Provider, MD   BP 124/65 mmHg  Pulse 75  Temp(Src) 97.9 F (  36.6 C) (Oral)  Resp 14  Ht 4\' 11"  (1.499 m)  Wt 214 lb (97.07 kg)  BMI 43.20 kg/m2  SpO2 99% Physical Exam  Constitutional: She is oriented to person, place, and time. She appears well-developed and well-nourished. No distress.  HENT:  Head: Normocephalic and atraumatic.  Right Ear: Tympanic membrane normal.  Left Ear: Tympanic membrane normal.  Nose: Mucosal edema present. Right sinus exhibits maxillary sinus tenderness and frontal sinus tenderness. Left sinus exhibits maxillary sinus tenderness and frontal sinus tenderness.  Eyes: EOM are normal. Pupils are equal, round, and reactive to light.  photophobia  Cardiovascular: Normal rate, regular rhythm, normal heart sounds and intact distal pulses.  Exam reveals no friction rub.   No murmur heard. Pulmonary/Chest: Effort normal and breath sounds normal. She has no wheezes. She has no rales. She exhibits tenderness.    Abdominal: Soft. Bowel sounds are normal. She exhibits no distension. There is no tenderness. There is no rebound and no guarding.  Musculoskeletal: Normal range of motion. She exhibits no tenderness.  No edema  Neurological: She is alert and oriented to person, place, and time. She has normal strength. No cranial nerve deficit or sensory deficit.  No vertiginous sx  Skin: Skin is warm and dry. No rash noted.  Psychiatric: She has a normal mood and affect. Her behavior is  normal.  Nursing note and vitals reviewed.   ED Course  Procedures (including critical care time) Labs Review Labs Reviewed  BASIC METABOLIC PANEL - Abnormal; Notable for the following:    Glucose, Bld 142 (*)    BUN 5 (*)    GFR calc non Af Amer 88 (*)    All other components within normal limits  CBC  I-STAT TROPOININ, ED    Imaging Review Ct Head Wo Contrast  12/01/2014   CLINICAL DATA:  PT. EXPERIENCED A SEVERE PAIN TO RIGHT SIDE OF HEAD THIS MORNING, HX HTN, NO HX CVA, HX BREAST CA, DENIES CONGESTION OR SINUS PAIN,  EXAM: CT HEAD WITHOUT CONTRAST  CT MAXILLOFACIAL WITHOUT CONTRAST  TECHNIQUE: Multidetector CT imaging of the head and maxillofacial structures were performed using the standard protocol without intravenous contrast. Multiplanar CT image reconstructions of the maxillofacial structures were also generated.  COMPARISON:  Head CT, 04/27/2011  FINDINGS: CT HEAD FINDINGS  Ventricles are normal in configuration. There is mild, age related, ventricular and sulcal enlargement. No hydrocephalus.  There are no parenchymal masses or mass effect. There is no evidence of an infarct. Minor periventricular white matter hypoattenuation noted consistent with chronic microvascular ischemic change.  There are no extra-axial masses or abnormal fluid collections.  There is no intracranial hemorrhage.  CT MAXILLOFACIAL FINDINGS  Sinuses are clear other than a focus of mucosal thickening along the anterior right sphenoid sinus. Ostiomeatal complex is patent. There is mild anatomic encroachment of the left ostiomeatal complex due to the left nasal septal deviation and mild mucosal thickening. Is small left nasal septal spur.  No areas of bone sclerosis or resorption.  No soft tissue masses or adenopathy.  Normal globes and orbits.  IMPRESSION: HEAD CT: No acute intracranial abnormalities. Mild age related volume loss. Minimal chronic microvascular ischemic change.  MAXILLOFACIAL CT: Sinus is essentially  clear. Mild nasal septal deviation to the right with a small right nasal septal spur. Otherwise unremarkable.   Electronically Signed   By: Lajean Manes M.D.   On: 12/01/2014 12:54   Dg Chest Port 1 View  12/01/2014   CLINICAL DATA:  Pt came to ER with chest pain that begins on right side of head down entire right side of body. States that she was at another location having a test done and they sent her to ER. Non smoker. Diabetic. Htn. Hx CABG and breast cancer. No other complaints per pt. Pt was in pain when questioning her so she didn't want to talk much.  EXAM: PORTABLE CHEST - 1 VIEW  COMPARISON:  07/26/2014  FINDINGS: Stable changes from previous CABG surgery. Cardiac silhouette is normal in size. No mediastinal or hilar masses. Elevation the right hemidiaphragm is stable. Lungs are clear. No pleural effusion or pneumothorax. Stable changes from right breast surgery. Bony thorax is demineralized but grossly intact.  IMPRESSION: No acute cardiopulmonary disease.   Electronically Signed   By: Lajean Manes M.D.   On: 12/01/2014 13:27   Ct Maxillofacial Wo Cm  12/01/2014   CLINICAL DATA:  PT. EXPERIENCED A SEVERE PAIN TO RIGHT SIDE OF HEAD THIS MORNING, HX HTN, NO HX CVA, HX BREAST CA, DENIES CONGESTION OR SINUS PAIN,  EXAM: CT HEAD WITHOUT CONTRAST  CT MAXILLOFACIAL WITHOUT CONTRAST  TECHNIQUE: Multidetector CT imaging of the head and maxillofacial structures were performed using the standard protocol without intravenous contrast. Multiplanar CT image reconstructions of the maxillofacial structures were also generated.  COMPARISON:  Head CT, 04/27/2011  FINDINGS: CT HEAD FINDINGS  Ventricles are normal in configuration. There is mild, age related, ventricular and sulcal enlargement. No hydrocephalus.  There are no parenchymal masses or mass effect. There is no evidence of an infarct. Minor periventricular white matter hypoattenuation noted consistent with chronic microvascular ischemic change.  There are  no extra-axial masses or abnormal fluid collections.  There is no intracranial hemorrhage.  CT MAXILLOFACIAL FINDINGS  Sinuses are clear other than a focus of mucosal thickening along the anterior right sphenoid sinus. Ostiomeatal complex is patent. There is mild anatomic encroachment of the left ostiomeatal complex due to the left nasal septal deviation and mild mucosal thickening. Is small left nasal septal spur.  No areas of bone sclerosis or resorption.  No soft tissue masses or adenopathy.  Normal globes and orbits.  IMPRESSION: HEAD CT: No acute intracranial abnormalities. Mild age related volume loss. Minimal chronic microvascular ischemic change.  MAXILLOFACIAL CT: Sinus is essentially clear. Mild nasal septal deviation to the right with a small right nasal septal spur. Otherwise unremarkable.   Electronically Signed   By: Lajean Manes M.D.   On: 12/01/2014 12:54     EKG Interpretation   Date/Time:  Saturday December 01 2014 11:42:43 EDT Ventricular Rate:  72 PR Interval:  146 QRS Duration: 82 QT Interval:  413 QTC Calculation: 452 R Axis:   9 Text Interpretation:  Sinus rhythm Abnormal R-wave progression, early  transition Borderline T abnormalities, anterior leads Baseline wander in  lead(s) I III aVL No significant change since last tracing Confirmed by  Maryan Rued  MD, Loree Fee ([id number]) on 12/01/2014 12:12:33 PM      MDM   Final diagnoses:  Headache  Chest pain  Pollen allergies    Patient presenting with vague symptoms for the last 2-3 weeks of generalized sinus pain worse in the right side with mild blurry vision on the right side he developed abrupt sharp headache today behind the right eye that radiated into her neck and chest. When this happened she started to feel lightheaded with palpitations and mild chest pain. She denies any fever, vomiting. She has had a productive cough with yellow sputum.  Patient recently saw her doctor Dr. Tamala Julian who prescribed her an antibiotic a  proximally 2 weeks ago without any improvement in her symptoms.  On exam patient has sinus tenderness no evidence of facial droop or unilateral weakness.  Patient does have a significant heart history of coronary artery disease status post CABG. However at this time her EKG is unchanged and she is having no pain.  Patient is also diabetic and states she has not eaten today. When she became to the she was getting ready to make herself something to eat.  Feel that patient's symptoms are most likely sinus versus allergy related. Low suspicion for cardiac cause, stroke, PE as the cause of her symptoms today.  CBC, BMP, troponin, chest x-ray, head CT, sinus CT pending.  Patient given headache cocktail.  1:42 PM All labs are wnl.  EKG and imaging without acute findings.  Pressure in right eye is 10 without signs of glaucoma.  2:03 PM On re-eval pt feels much better and headache has resolved.  Feel most likely this allergy in nature.  Will start on zyrtec and have pt f/u with PCP this week.  [name], MD 12/01/14 1404  [name], MD 12/01/14 1406

## 2014-12-01 NOTE — ED Notes (Signed)
IV team at bedside.pt in CT, call to CT pt done with CT and on her way back to the room.

## 2014-12-01 NOTE — ED Notes (Signed)
Received pt from home with c/o woke up with dizziness around 0830. Pt reports having a headache and that the pain radiates to right arm. Pain now right Mid axilla with shortness of breath.

## 2014-12-01 NOTE — Discharge Instructions (Signed)

## 2014-12-03 ENCOUNTER — Encounter (HOSPITAL_COMMUNITY): Payer: Self-pay | Admitting: *Deleted

## 2014-12-03 ENCOUNTER — Emergency Department (HOSPITAL_COMMUNITY)
Admission: EM | Admit: 2014-12-03 | Discharge: 2014-12-03 | Disposition: A | Discharge status: Home / Self Care | Payer: Medicaid Other | Attending: Emergency Medicine | Admitting: Emergency Medicine

## 2014-12-03 DIAGNOSIS — J45909 Unspecified asthma, uncomplicated: Secondary | ICD-10-CM | POA: Diagnosis not present

## 2014-12-03 DIAGNOSIS — R300 Dysuria: Secondary | ICD-10-CM | POA: Diagnosis present

## 2014-12-03 DIAGNOSIS — E663 Overweight: Secondary | ICD-10-CM | POA: Diagnosis not present

## 2014-12-03 DIAGNOSIS — E119 Type 2 diabetes mellitus without complications: Secondary | ICD-10-CM | POA: Diagnosis not present

## 2014-12-03 DIAGNOSIS — Z79899 Other long term (current) drug therapy: Secondary | ICD-10-CM | POA: Diagnosis not present

## 2014-12-03 DIAGNOSIS — Z86010 Personal history of colonic polyps: Secondary | ICD-10-CM | POA: Insufficient documentation

## 2014-12-03 DIAGNOSIS — I252 Old myocardial infarction: Secondary | ICD-10-CM | POA: Diagnosis not present

## 2014-12-03 DIAGNOSIS — Z87891 Personal history of nicotine dependence: Secondary | ICD-10-CM | POA: Insufficient documentation

## 2014-12-03 DIAGNOSIS — Z853 Personal history of malignant neoplasm of breast: Secondary | ICD-10-CM | POA: Insufficient documentation

## 2014-12-03 DIAGNOSIS — I10 Essential (primary) hypertension: Secondary | ICD-10-CM | POA: Diagnosis not present

## 2014-12-03 DIAGNOSIS — Z7982 Long term (current) use of aspirin: Secondary | ICD-10-CM | POA: Insufficient documentation

## 2014-12-03 DIAGNOSIS — M1990 Unspecified osteoarthritis, unspecified site: Secondary | ICD-10-CM | POA: Diagnosis not present

## 2014-12-03 DIAGNOSIS — E785 Hyperlipidemia, unspecified: Secondary | ICD-10-CM | POA: Insufficient documentation

## 2014-12-03 DIAGNOSIS — Z9861 Coronary angioplasty status: Secondary | ICD-10-CM | POA: Diagnosis not present

## 2014-12-03 DIAGNOSIS — N390 Urinary tract infection, site not specified: Secondary | ICD-10-CM | POA: Diagnosis not present

## 2014-12-03 DIAGNOSIS — Z951 Presence of aortocoronary bypass graft: Secondary | ICD-10-CM | POA: Diagnosis not present

## 2014-12-03 DIAGNOSIS — I2510 Atherosclerotic heart disease of native coronary artery without angina pectoris: Secondary | ICD-10-CM | POA: Insufficient documentation

## 2014-12-03 DIAGNOSIS — I509 Heart failure, unspecified: Secondary | ICD-10-CM | POA: Diagnosis not present

## 2014-12-03 DIAGNOSIS — G8929 Other chronic pain: Secondary | ICD-10-CM | POA: Insufficient documentation

## 2014-12-03 LAB — URINE MICROSCOPIC-ADD ON

## 2014-12-03 LAB — COMPREHENSIVE METABOLIC PANEL
ALK PHOS: 102 U/L (ref 39–117)
ALT: 18 U/L (ref 0–35)
ANION GAP: 11 (ref 5–15)
AST: 22 U/L (ref 0–37)
Albumin: 3.7 g/dL (ref 3.5–5.2)
BILIRUBIN TOTAL: 0.5 mg/dL (ref 0.3–1.2)
BUN: 8 mg/dL (ref 6–23)
CO2: 24 mmol/L (ref 19–32)
Calcium: 9.6 mg/dL (ref 8.4–10.5)
Chloride: 104 mmol/L (ref 96–112)
Creatinine, Ser: 0.78 mg/dL (ref 0.50–1.10)
GFR calc non Af Amer: 87 mL/min — ABNORMAL LOW (ref >90)
Glucose, Bld: 148 mg/dL — ABNORMAL HIGH (ref 70–99)
Potassium: 3.9 mmol/L (ref 3.5–5.1)
SODIUM: 139 mmol/L (ref 135–145)
TOTAL PROTEIN: 6.2 g/dL (ref 6.0–8.3)

## 2014-12-03 LAB — CBC WITH DIFFERENTIAL/PLATELET
Basophils Absolute: 0 10*3/uL (ref 0.0–0.1)
Basophils Relative: 0 % (ref 0–1)
Eosinophils Absolute: 0.1 10*3/uL (ref 0.0–0.7)
Eosinophils Relative: 1 % (ref 0–5)
HCT: 42.8 % (ref 36.0–46.0)
HEMOGLOBIN: 13.7 g/dL (ref 12.0–15.0)
LYMPHS ABS: 2.7 10*3/uL (ref 0.7–4.0)
LYMPHS PCT: 47 % — AB (ref 12–46)
MCH: 28.1 pg (ref 26.0–34.0)
MCHC: 32 g/dL (ref 30.0–36.0)
MCV: 87.7 fL (ref 78.0–100.0)
MONOS PCT: 8 % (ref 3–12)
Monocytes Absolute: 0.5 10*3/uL (ref 0.1–1.0)
NEUTROS PCT: 44 % (ref 43–77)
Neutro Abs: 2.5 10*3/uL (ref 1.7–7.7)
Platelets: 192 10*3/uL (ref 150–400)
RBC: 4.88 MIL/uL (ref 3.87–5.11)
RDW: 15 % (ref 11.5–15.5)
WBC: 5.8 10*3/uL (ref 4.0–10.5)

## 2014-12-03 LAB — URINALYSIS, ROUTINE W REFLEX MICROSCOPIC
BILIRUBIN URINE: NEGATIVE
Glucose, UA: NEGATIVE mg/dL
Ketones, ur: 15 mg/dL — AB
NITRITE: POSITIVE — AB
PH: 5.5 (ref 5.0–8.0)
Protein, ur: 100 mg/dL — AB
Specific Gravity, Urine: 1.019 (ref 1.005–1.030)
Urobilinogen, UA: 1 mg/dL (ref 0.0–1.0)

## 2014-12-03 MED ORDER — PHENAZOPYRIDINE HCL 200 MG PO TABS: 200.0000 mg | ORAL_TABLET | Freq: Three times a day (TID) | ORAL | Status: DC

## 2014-12-03 MED ORDER — PHENAZOPYRIDINE HCL 100 MG PO TABS
200.0000 mg | ORAL_TABLET | Freq: Three times a day (TID) | ORAL | Status: DC
  Administered 2014-12-03: 200 mg via ORAL
  Filled 2014-12-03: qty 2

## 2014-12-03 MED ORDER — CEPHALEXIN 500 MG PO CAPS: 500.0000 mg | ORAL_CAPSULE | Freq: Three times a day (TID) | ORAL | Status: DC

## 2014-12-03 MED ORDER — CEPHALEXIN 250 MG PO CAPS
500.0000 mg | ORAL_CAPSULE | Freq: Once | ORAL | Status: AC
  Administered 2014-12-03: 500 mg via ORAL
  Filled 2014-12-03: qty 2

## 2014-12-03 NOTE — ED Provider Notes (Signed)
CSN: [id number]     Arrival date & time 12/03/14  0424 History   First MD Initiated Contact with Patient 12/03/14 0455     Chief Complaint  Patient presents with  . Abdominal Pain  . Dysuria     (Consider location/radiation/quality/duration/timing/severity/associated sxs/prior Treatment) HPI Patricia Cuevas is a 64 y.o. female with multiple medical problems, presents to ED with complaint of lower abdominal pain, dysuria, urinary frequency, onset just few hours ago. States woke up in the middle of the night with symptoms. Pain is in the suprapubic area, described as "crampy," does not radiate. Denies hx of the same symptoms. Denies hematuria. No flank pain. No fever or chills. No nausea, vomiting. No diarrhea. Was recently seen for headache and sinusitis. Nothing making symptoms better. Did not try any tx prior to coming in.    Past Medical History  Diagnosis Date  . Hx of CABG     2007  . Hypertension   . Diabetes mellitus   . Ejection fraction     EF 65%, echo, [place], Jan 16, 2011  . Dyslipidemia   . CAD (coronary artery disease)     Nuclear stress test Feb 11, 2011, EF 64%, no scar or ischemia    //         catheterization, November, 2011,patent LIMA-LAD-small caliber distal vessel with diffuse plaque, patent SVG to OM1 and OM 2, patent SVG to PDA and PLA, occluded SVG to diagonal, medical therapy;  Lexiscan Myoview 6/14:  Inferior thinning, no ischemia, EF 61%, low risk  . Overweight(278.02)     stomach stapling 1985 followed by weight loss, then return of weight  . Tobacco abuse     in the past, resolved  . Alcohol abuse     in the past, resolved  . Nausea & vomiting     hospitalization, November, 2011, stricture GE junction, questionable stenosis gastrojejunostomy, disruptive primary peristaltic wave, GE reflux, delayed emptying proximal gastric pouch  . Hx of colonic polyps   . Asthma   . Myocardial infarction   . Breast cancer     Double mastectomy 2007; s/p tamoxifen  and arimidex therapy; no recurrence since 05/2008  . Sleep apnea     CPAP being arranged May, 2011//does not use CPAP  . Arthritis   . Blood transfusion without reported diagnosis 1984    after surgery  . Hyperlipidemia   . CHF (congestive heart failure)   . Chronic back pain   . Sciatica   . Normal cardiac stress test 02/2013   Past Surgical History  Procedure Laterality Date  . Coronary artery bypass graft  2009  . Mastectomy Bilateral 1987  . Coronary stent placement    . Abdominal hysterectomy  1989  . Gastric bypass  1983   Family History  Problem Relation Age of Onset  . Hypertension Mother   . Heart disease Mother   . Diabetes Mother   . Heart disease Father   . Colon cancer Father     does not know age of onset  . Heart disease Sister   . Colon cancer Sister 4  . Stomach cancer Sister   . Asthma Sister   . Esophageal cancer Neg Hx   . Rectal cancer Neg Hx    History  Substance Use Topics  . Smoking status: Former Smoker -- 3.00 packs/day for 27 years    Types: Cigarettes    Quit date: 09/14/2000  . Smokeless tobacco: Never Used  . Alcohol Use:  No     Comment: History of alcohol abuse but quit 5 years ago   OB History    No data available     Review of Systems  Constitutional: Negative for fever and chills.  Respiratory: Negative for cough, chest tightness and shortness of breath.   Cardiovascular: Negative for chest pain, palpitations and leg swelling.  Gastrointestinal: Positive for abdominal pain. Negative for nausea, vomiting and diarrhea.  Genitourinary: Positive for dysuria, urgency, frequency and pelvic pain. Negative for hematuria, flank pain, vaginal bleeding, vaginal discharge and vaginal pain.  Musculoskeletal: Negative for myalgias, arthralgias, neck pain and neck stiffness.  Skin: Negative for rash.  Neurological: Negative for dizziness, weakness and headaches.  All other systems reviewed and are negative.     Allergies  Review of  patient's allergies indicates no known allergies.  Home Medications   Prior to Admission medications   Medication Sig Start Date End Date Taking? Authorizing Provider  acetaminophen (TYLENOL) 325 MG tablet Take 650 mg by mouth every 6 (six) hours as needed for mild pain.   Yes Historical Provider, MD  acetaminophen-codeine 120-12 MG/5ML solution Take 10 mLs by mouth every 4 (four) hours as needed for moderate pain. 05/17/14  Yes Christopher Lawyer, PA-C  albuterol (PROVENTIL HFA;VENTOLIN HFA) 108 (90 BASE) MCG/ACT inhaler Inhale 2 puffs into the lungs every 6 (six) hours as needed for shortness of breath.    Yes Historical Provider, MD  aspirin EC 81 MG tablet Take 81 mg by mouth every other day.    Yes Historical Provider, MD  atorvastatin (LIPITOR) 40 MG tablet Take 1 tablet (40 mg total) by mouth daily. 05/11/14  Yes [name], MD  Cetirizine HCl 10 MG CAPS Take 1 capsule (10 mg total) by mouth daily. 12/01/14  Yes [name], MD  HYDROcodone-acetaminophen (NORCO/VICODIN) 5-325 MG per tablet Take 1 tablet by mouth every 4 (four) hours as needed for moderate pain. 04/10/14  Yes Jola Schmidt, MD  isosorbide mononitrate (IMDUR) 30 MG 24 hr tablet Take 1 tablet (30 mg total) by mouth daily. 11/12/14  Yes [name], PA-C  metoprolol tartrate (LOPRESSOR) 25 MG tablet Take 1 tablet (25 mg total) by mouth daily. 11/12/14  Yes [name], PA-C  nitroGLYCERIN (NITROSTAT) 0.4 MG SL tablet Place 1 tablet (0.4 mg total) under the tongue every 5 (five) minutes as needed for chest pain. 11/12/14  Yes [name], PA-C  RABEprazole (ACIPHEX) 20 MG tablet Take 20 mg by mouth daily.   Yes Historical Provider, MD  amoxicillin-clavulanate (AUGMENTIN) 500-125 MG per tablet Take 1 tablet (500 mg total) by mouth every 8 (eight) hours. Patient not taking: Reported on 12/03/2014 07/26/14   Delos Haring, PA-C  flintstones complete (FLINTSTONES) 60 MG chewable tablet Chew 2 tablets by mouth daily.     Historical Provider, MD  lisinopril-hydrochlorothiazide (PRINZIDE,ZESTORETIC) 20-12.5 MG per tablet Take 1 tablet by mouth daily.      Historical Provider, MD  phenazopyridine (PYRIDIUM) 95 MG tablet Take 1 tablet (95 mg total) by mouth 3 (three) times daily. Patient not taking: Reported on 12/03/2014 06/20/14   Carmin Muskrat, MD   BP 107/71 mmHg  Pulse 80  Temp(Src) 97.8 F (36.6 C) (Oral)  Resp 22  SpO2 97% Physical Exam  Constitutional: She appears well-developed and well-nourished. No distress.  HENT:  Head: Normocephalic.  Eyes: Conjunctivae are normal.  Neck: Neck supple.  Cardiovascular: Normal rate, regular rhythm and normal heart sounds.   Pulmonary/Chest: Effort normal and  breath sounds normal. No respiratory distress. She has no wheezes. She has no rales.  Abdominal: Soft. Bowel sounds are normal. She exhibits no distension. There is tenderness. There is no rebound.  Suprapubic tenderness. No CVA tenderness bilaterally  Musculoskeletal: She exhibits no edema.  Neurological: She is alert.  Skin: Skin is warm and dry.  Psychiatric: She has a normal mood and affect. Her behavior is normal.  Nursing note and vitals reviewed.   ED Course  Procedures (including critical care time) Labs Review Labs Reviewed  CBC WITH DIFFERENTIAL/PLATELET - Abnormal; Notable for the following:    Lymphocytes Relative 47 (*)    All other components within normal limits  COMPREHENSIVE METABOLIC PANEL - Abnormal; Notable for the following:    Glucose, Bld 148 (*)    GFR calc non Af Amer 87 (*)    All other components within normal limits  URINALYSIS, ROUTINE W REFLEX MICROSCOPIC - Abnormal; Notable for the following:    Color, Urine RED (*)    APPearance TURBID (*)    Hgb urine dipstick LARGE (*)    Ketones, ur 15 (*)    Protein, ur 100 (*)    Nitrite POSITIVE (*)    Leukocytes, UA LARGE (*)    All other components within normal limits  URINE MICROSCOPIC-ADD ON - Abnormal; Notable for  the following:    Squamous Epithelial / LPF FEW (*)    Bacteria, UA MANY (*)    All other components within normal limits  URINE CULTURE    Imaging Review Ct Head Wo Contrast  12/01/2014   CLINICAL DATA:  PT. EXPERIENCED A SEVERE PAIN TO RIGHT SIDE OF HEAD THIS MORNING, HX HTN, NO HX CVA, HX BREAST CA, DENIES CONGESTION OR SINUS PAIN,  EXAM: CT HEAD WITHOUT CONTRAST  CT MAXILLOFACIAL WITHOUT CONTRAST  TECHNIQUE: Multidetector CT imaging of the head and maxillofacial structures were performed using the standard protocol without intravenous contrast. Multiplanar CT image reconstructions of the maxillofacial structures were also generated.  COMPARISON:  Head CT, 04/27/2011  FINDINGS: CT HEAD FINDINGS  Ventricles are normal in configuration. There is mild, age related, ventricular and sulcal enlargement. No hydrocephalus.  There are no parenchymal masses or mass effect. There is no evidence of an infarct. Minor periventricular white matter hypoattenuation noted consistent with chronic microvascular ischemic change.  There are no extra-axial masses or abnormal fluid collections.  There is no intracranial hemorrhage.  CT MAXILLOFACIAL FINDINGS  Sinuses are clear other than a focus of mucosal thickening along the anterior right sphenoid sinus. Ostiomeatal complex is patent. There is mild anatomic encroachment of the left ostiomeatal complex due to the left nasal septal deviation and mild mucosal thickening. Is small left nasal septal spur.  No areas of bone sclerosis or resorption.  No soft tissue masses or adenopathy.  Normal globes and orbits.  IMPRESSION: HEAD CT: No acute intracranial abnormalities. Mild age related volume loss. Minimal chronic microvascular ischemic change.  MAXILLOFACIAL CT: Sinus is essentially clear. Mild nasal septal deviation to the right with a small right nasal septal spur. Otherwise unremarkable.   Electronically Signed   By: Lajean Manes M.D.   On: 12/01/2014 12:54   Dg Chest Port  1 View  12/01/2014   CLINICAL DATA:  Pt came to ER with chest pain that begins on right side of head down entire right side of body. States that she was at another location having a test done and they sent her to ER. Non smoker. Diabetic. Htn.  Hx CABG and breast cancer. No other complaints per pt. Pt was in pain when questioning her so she didn't want to talk much.  EXAM: PORTABLE CHEST - 1 VIEW  COMPARISON:  07/26/2014  FINDINGS: Stable changes from previous CABG surgery. Cardiac silhouette is normal in size. No mediastinal or hilar masses. Elevation the right hemidiaphragm is stable. Lungs are clear. No pleural effusion or pneumothorax. Stable changes from right breast surgery. Bony thorax is demineralized but grossly intact.  IMPRESSION: No acute cardiopulmonary disease.   Electronically Signed   By: Lajean Manes M.D.   On: 12/01/2014 13:27   Ct Maxillofacial Wo Cm  12/01/2014   CLINICAL DATA:  PT. EXPERIENCED A SEVERE PAIN TO RIGHT SIDE OF HEAD THIS MORNING, HX HTN, NO HX CVA, HX BREAST CA, DENIES CONGESTION OR SINUS PAIN,  EXAM: CT HEAD WITHOUT CONTRAST  CT MAXILLOFACIAL WITHOUT CONTRAST  TECHNIQUE: Multidetector CT imaging of the head and maxillofacial structures were performed using the standard protocol without intravenous contrast. Multiplanar CT image reconstructions of the maxillofacial structures were also generated.  COMPARISON:  Head CT, 04/27/2011  FINDINGS: CT HEAD FINDINGS  Ventricles are normal in configuration. There is mild, age related, ventricular and sulcal enlargement. No hydrocephalus.  There are no parenchymal masses or mass effect. There is no evidence of an infarct. Minor periventricular white matter hypoattenuation noted consistent with chronic microvascular ischemic change.  There are no extra-axial masses or abnormal fluid collections.  There is no intracranial hemorrhage.  CT MAXILLOFACIAL FINDINGS  Sinuses are clear other than a focus of mucosal thickening along the anterior  right sphenoid sinus. Ostiomeatal complex is patent. There is mild anatomic encroachment of the left ostiomeatal complex due to the left nasal septal deviation and mild mucosal thickening. Is small left nasal septal spur.  No areas of bone sclerosis or resorption.  No soft tissue masses or adenopathy.  Normal globes and orbits.  IMPRESSION: HEAD CT: No acute intracranial abnormalities. Mild age related volume loss. Minimal chronic microvascular ischemic change.  MAXILLOFACIAL CT: Sinus is essentially clear. Mild nasal septal deviation to the right with a small right nasal septal spur. Otherwise unremarkable.   Electronically Signed   By: Lajean Manes M.D.   On: 12/01/2014 12:54     EKG Interpretation None      MDM   Final diagnoses:  UTI (lower urinary tract infection)    Patient with suprapubic pain, dysuria, urinary frequency and urgency onset just few hours ago. Urinalysis showing infection. Blood work unremarkable. She is afebrile, no evidence of pyelonephritis, no flank pain, no nausea or vomiting. Will start on Keflex, Pyridium for her symptoms. Discharge home with outpatient follow-up. Return precautions discussed.   Filed Vitals:   12/03/14 0437 12/03/14 0601  BP: 117/51 107/71  Pulse: 78 80  Temp: 97.8 F (36.6 C)   TempSrc: Oral   Resp: 22 22  SpO2: 97% 97%        Jeannett Senior, PA-C 12/03/14 1546  April Palumbo, MD 12/06/14 0017

## 2014-12-03 NOTE — ED Notes (Signed)
Pt in c/o lower abd pain and painful urination since 12, denies n/v, states abd pain is worse during urination

## 2014-12-03 NOTE — Discharge Instructions (Signed)
Take keflex as prescribed for the infection, take until all gone. Take pyridium to help with your symptoms. Follow up with your doctor in 5-7 days to make sure infection improved. Return if fever, vomiting, back pain.   Urinary Tract Infection Urinary tract infections (UTIs) can develop anywhere along your urinary tract. Your urinary tract is your body's drainage system for removing wastes and extra water. Your urinary tract includes two kidneys, two ureters, a bladder, and a urethra. Your kidneys are a pair of bean-shaped organs. Each kidney is about the size of your fist. They are located below your ribs, one on each side of your spine. CAUSES Infections are caused by microbes, which are microscopic organisms, including fungi, viruses, and bacteria. These organisms are so small that they can only be seen through a microscope. Bacteria are the microbes that most commonly cause UTIs. SYMPTOMS  Symptoms of UTIs may vary by age and gender of the patient and by the location of the infection. Symptoms in young women typically include a frequent and intense urge to urinate and a painful, burning feeling in the bladder or urethra during urination. Older women and men are more likely to be tired, shaky, and weak and have muscle aches and abdominal pain. A fever may mean the infection is in your kidneys. Other symptoms of a kidney infection include pain in your back or sides below the ribs, nausea, and vomiting. DIAGNOSIS To diagnose a UTI, your caregiver will ask you about your symptoms. Your caregiver also will ask to provide a urine sample. The urine sample will be tested for bacteria and white blood cells. White blood cells are made by your body to help fight infection. TREATMENT  Typically, UTIs can be treated with medication. Because most UTIs are caused by a bacterial infection, they usually can be treated with the use of antibiotics. The choice of antibiotic and length of treatment depend on your symptoms  and the type of bacteria causing your infection. HOME CARE INSTRUCTIONS  If you were prescribed antibiotics, take them exactly as your caregiver instructs you. Finish the medication even if you feel better after you have only taken some of the medication.  Drink enough water and fluids to keep your urine clear or pale yellow.  Avoid caffeine, tea, and carbonated beverages. They tend to irritate your bladder.  Empty your bladder often. Avoid holding urine for long periods of time.  Empty your bladder before and after sexual intercourse.  After a bowel movement, women should cleanse from front to back. Use each tissue only once. SEEK MEDICAL CARE IF:   You have back pain.  You develop a fever.  Your symptoms do not begin to resolve within 3 days. SEEK IMMEDIATE MEDICAL CARE IF:   You have severe back pain or lower abdominal pain.  You develop chills.  You have nausea or vomiting.  You have continued burning or discomfort with urination. MAKE SURE YOU:   Understand these instructions.  Will watch your condition.  Will get help right away if you are not doing well or get worse. Document Released: 06/10/2005 Document Revised: 03/01/2012 Document Reviewed: 10/09/2011 [facility] Patient Information 2015 [place], [place]. This information is not intended to replace advice given to you by your health care provider. Make sure you discuss any questions you have with your health care provider.

## 2014-12-04 ENCOUNTER — Telehealth: Payer: Self-pay | Admitting: *Deleted

## 2014-12-04 NOTE — Telephone Encounter (Signed)
-----   Message from [name], PA-C sent at 12/03/2014  2:45 PM EDT ----- Normal stress nuclear study

## 2014-12-05 ENCOUNTER — Telehealth: Payer: Self-pay | Admitting: Cardiology

## 2014-12-05 LAB — URINE CULTURE: Colony Count: >=100000

## 2014-12-05 NOTE — Telephone Encounter (Signed)
The pt has been rescheduled to 5/11, she is in agreement with new date and time.

## 2014-12-05 NOTE — Telephone Encounter (Signed)
New Message  Pt called  to resch 3/25 app to May due to family emergency, and wanted to speak w/ Rn to see if it was okay to wait until May for appt. Please call back and discuss.

## 2014-12-06 ENCOUNTER — Telehealth (HOSPITAL_BASED_OUTPATIENT_CLINIC_OR_DEPARTMENT_OTHER): Payer: Self-pay | Admitting: Emergency Medicine

## 2014-12-06 NOTE — Telephone Encounter (Signed)
Post ED Visit - Positive Culture Follow-up  Culture report reviewed by antimicrobial stewardship pharmacist: []  Wes [place], Pharm.D., BCPS [x]  [name], Pharm.D., BCPS []  Alycia Rossetti, Pharm.D., BCPS []  [place], [place].D., BCPS, AAHIVP []  Legrand Como, Pharm.D., BCPS, AAHIVP []  Isac Sarna, Pharm.D., BCPS  Positive urine culture E. Coli Treated with cephalexin, organism sensitive to the same and no further patient follow-up is required at this time.  Hazle Nordmann 12/06/2014, 9:23 AM

## 2014-12-07 ENCOUNTER — Ambulatory Visit: Payer: Medicaid Other | Admitting: Cardiology

## 2015-01-23 ENCOUNTER — Encounter: Payer: Self-pay | Admitting: Cardiology

## 2015-01-23 ENCOUNTER — Ambulatory Visit (INDEPENDENT_AMBULATORY_CARE_PROVIDER_SITE_OTHER): Payer: Medicare Other | Admitting: Cardiology

## 2015-01-23 VITALS — BP 144/82 | HR 75 | Ht 59.0 in | Wt 220.0 lb

## 2015-01-23 DIAGNOSIS — I25810 Atherosclerosis of coronary artery bypass graft(s) without angina pectoris: Secondary | ICD-10-CM

## 2015-01-23 DIAGNOSIS — E785 Hyperlipidemia, unspecified: Secondary | ICD-10-CM

## 2015-01-23 NOTE — Patient Instructions (Signed)
**Note De-Identified Patricia Cuevas Obfuscation** Medication Instructions:  Same  Labwork: None  Testing/Procedures: None  Follow-Up: Your physician wants you to follow-up in: 6 months. You will receive a reminder letter in the mail two months in advance. If you don't receive a letter, please call our office to schedule the follow-up appointment.

## 2015-01-23 NOTE — Assessment & Plan Note (Signed)
The patient is receiving a guideline directed therapy. No change in therapy.

## 2015-01-23 NOTE — Assessment & Plan Note (Signed)
The patient is feeling well now. Her nuclear study in March, 2016 revealed no significant ischemia. No further workup at this time.

## 2015-01-23 NOTE — Progress Notes (Signed)
Cardiology Office Note   Date:  01/23/2015   ID:  BRODIE SCOVELL, DOB [birth year], MRN [id number]  PCP:  [name], MD  Cardiologist:  Dola Argyle, MD   Chief Complaint  Patient presents with  . Appointment    Follow-up coronary artery disease      History of Present Illness: Patricia Cuevas is a 64 y.o. female who presents for follow-up of coronary disease. She was assessed fully in the office by Estella Husk PA-C on November 12, 2014. She has a history of coronary disease. She had some vague chest discomfort. Therefore stress nuclear study was arranged. This study was done on November 30, 2014. It showed normal LV function and no significant scar or ischemia. She's here for follow-up today and she is feeling well. She has not had any recurrent significant chest discomfort.  Past Medical History  Diagnosis Date  . Hx of CABG     2007  . Hypertension   . Diabetes mellitus   . Ejection fraction     EF 65%, echo, [place], Jan 16, 2011  . Dyslipidemia   . CAD (coronary artery disease)     Nuclear stress test Feb 11, 2011, EF 64%, no scar or ischemia    //         catheterization, November, 2011,patent LIMA-LAD-small caliber distal vessel with diffuse plaque, patent SVG to OM1 and OM 2, patent SVG to PDA and PLA, occluded SVG to diagonal, medical therapy;  Lexiscan Myoview 6/14:  Inferior thinning, no ischemia, EF 61%, low risk  . Overweight(278.02)     stomach stapling 1985 followed by weight loss, then return of weight  . Tobacco abuse     in the past, resolved  . Alcohol abuse     in the past, resolved  . Nausea & vomiting     hospitalization, November, 2011, stricture GE junction, questionable stenosis gastrojejunostomy, disruptive primary peristaltic wave, GE reflux, delayed emptying proximal gastric pouch  . Hx of colonic polyps   . Asthma   . Myocardial infarction   . Breast cancer     Double mastectomy 2007; s/p tamoxifen and arimidex therapy; no recurrence  since 05/2008  . Sleep apnea     CPAP being arranged May, 2011//does not use CPAP  . Arthritis   . Blood transfusion without reported diagnosis 1984    after surgery  . Hyperlipidemia   . CHF (congestive heart failure)   . Chronic back pain   . Sciatica   . Normal cardiac stress test 02/2013    Past Surgical History  Procedure Laterality Date  . Coronary artery bypass graft  2009  . Mastectomy Bilateral 1987  . Coronary stent placement    . Abdominal hysterectomy  1989  . Gastric bypass  1983    Patient Active Problem List   Diagnosis Date Noted  . Hx of CABG     Priority: High  . Dyslipidemia     Priority: High  . Ejection fraction     Priority: High  . Chest pain 11/12/2014  . Upper GI bleed 03/04/2013  . Atypical chest pain 01/07/2013  . History of tobacco abuse 01/07/2013  . SIRS (systemic inflammatory response syndrome) 07/28/2012  . Tachycardia 07/28/2012  . GERD (gastroesophageal reflux disease) 02/24/2011  . CAD (coronary artery disease)   . Overweight(278.02)   . OSA (obstructive sleep apnea)   . Nausea & vomiting   . Breast cancer   . Hypertension   . Diabetes  mellitus       Current Outpatient Prescriptions  Medication Sig Dispense Refill  . acetaminophen (TYLENOL) 325 MG tablet Take 650 mg by mouth every 6 (six) hours as needed for mild pain.    [name] Kitchen acetaminophen-codeine 120-12 MG/5ML solution Take 10 mLs by mouth every 4 (four) hours as needed for moderate pain. 120 mL 0  . albuterol (PROVENTIL HFA;VENTOLIN HFA) 108 (90 BASE) MCG/ACT inhaler Inhale 2 puffs into the lungs every 6 (six) hours as needed for shortness of breath.     [name] Kitchen aspirin EC 81 MG tablet Take 81 mg by mouth every other day.     [name] Kitchen atorvastatin (LIPITOR) 40 MG tablet Take 1 tablet (40 mg total) by mouth daily. 30 tablet 11  . cephALEXin (KEFLEX) 500 MG capsule Take 1 capsule (500 mg total) by mouth 3 (three) times daily. 21 capsule 0  . Cetirizine HCl 10 MG CAPS Take 1 capsule (10 mg  total) by mouth daily. 30 capsule 0  . flintstones complete (FLINTSTONES) 60 MG chewable tablet Chew 2 tablets by mouth daily.    . isosorbide mononitrate (IMDUR) 30 MG 24 hr tablet Take 1 tablet (30 mg total) by mouth daily. 30 tablet 6  . lisinopril-hydrochlorothiazide (PRINZIDE,ZESTORETIC) 20-12.5 MG per tablet Take 1 tablet by mouth daily.      . metoprolol tartrate (LOPRESSOR) 25 MG tablet Take 1 tablet (25 mg total) by mouth daily. 30 tablet 6  . nitroGLYCERIN (NITROSTAT) 0.4 MG SL tablet Place 1 tablet (0.4 mg total) under the tongue every 5 (five) minutes as needed for chest pain. 25 tablet 3  . RABEprazole (ACIPHEX) 20 MG tablet Take 20 mg by mouth daily.    [name] Kitchen VICODIN HP 10-300 MG TABS Take 2 tablets by mouth every 6 (six) hours as needed (takes two tablets by mouth as neded for knee and back pain).   0   No current facility-administered medications for this visit.    Allergies:   Review of patient's allergies indicates no known allergies.    Social History:  The patient  reports that she quit smoking about 14 years ago. Her smoking use included Cigarettes. She has a 81 pack-year smoking history. She has never used smokeless tobacco. She reports that she does not drink alcohol or use illicit drugs.   Family History:  The patient's family history includes Asthma in her sister; Colon cancer in her father; Colon cancer (age of onset: 84) in her sister; Diabetes in her mother; Heart disease in her father, mother, and sister; Hypertension in her mother; Stomach cancer in her sister. There is no history of Esophageal cancer or Rectal cancer.    ROS:  Please see the history of present illness.   Patient denies fever, chills, headache, sweats, rash, change in vision, change in hearing, chest pain, cough, nausea or vomiting, urinary symptoms. All other systems are reviewed and are negative.     PHYSICAL EXAM: VS:  BP 144/82 mmHg  Pulse 75  Ht 4\' 11"  (1.499 m)  Wt 220 lb (99.791 kg)  BMI  44.41 kg/m2  SpO2 97% , The patient is overweight. She is oriented to person time and place. Affect is normal. Head is atraumatic. Sclera and conjunctiva are normal. There is no jugulovenous distention. Lungs are clear. Respiratory effort is not labored. Cardiac exam reveals S1 and S2. The abdomen is soft. There is no peripheral edema. There are no musculoskeletal deformities. There are no skin rashes.  EKG:   EKG is not  done today.   Recent Labs: 12/03/2014: ALT 18; BUN 8; Creatinine 0.78; Hemoglobin 13.7; Platelets 192; Potassium 3.9; Sodium 139    Lipid Panel    Component Value Date/Time   CHOL  05/17/2009 0130    139        ATP III CLASSIFICATION:  <200     mg/dL   Desirable  200-239  mg/dL   Borderline High  >=240    mg/dL   High          TRIG 93 05/17/2009 0130   HDL 41 05/17/2009 0130   CHOLHDL 3.4 05/17/2009 0130   VLDL 19 05/17/2009 0130   LDLCALC  05/17/2009 0130    79        Total Cholesterol/HDL:CHD Risk Coronary Heart Disease Risk Table                     Men   Women  1/2 Average Risk   3.4   3.3  Average Risk       5.0   4.4  2 X Average Risk   9.6   7.1  3 X Average Risk  23.4   11.0        Use the calculated Patient Ratio above and the CHD Risk Table to determine the patient's CHD Risk.        ATP III CLASSIFICATION (LDL):  <100     mg/dL   Optimal  100-129  mg/dL   Near or Above                    Optimal  130-159  mg/dL   Borderline  160-189  mg/dL   High  >190     mg/dL   Very High      Wt Readings from Last 3 Encounters:  01/23/15 220 lb (99.791 kg)  12/01/14 214 lb (97.07 kg)  11/30/14 214 lb (97.07 kg)      Current medicines are reviewed  The patient understands her medications.     ASSESSMENT AND PLAN:

## 2015-02-01 ENCOUNTER — Ambulatory Visit: Payer: Medicaid Other | Admitting: Cardiology

## 2015-02-13 ENCOUNTER — Encounter: Payer: Self-pay | Admitting: Gastroenterology

## 2015-03-01 ENCOUNTER — Encounter (HOSPITAL_COMMUNITY): Payer: Self-pay | Admitting: *Deleted

## 2015-03-01 DIAGNOSIS — Z951 Presence of aortocoronary bypass graft: Secondary | ICD-10-CM | POA: Diagnosis not present

## 2015-03-01 DIAGNOSIS — E785 Hyperlipidemia, unspecified: Secondary | ICD-10-CM | POA: Diagnosis not present

## 2015-03-01 DIAGNOSIS — Y93E5 Activity, floor mopping and cleaning: Secondary | ICD-10-CM | POA: Diagnosis not present

## 2015-03-01 DIAGNOSIS — Y999 Unspecified external cause status: Secondary | ICD-10-CM | POA: Insufficient documentation

## 2015-03-01 DIAGNOSIS — G8929 Other chronic pain: Secondary | ICD-10-CM | POA: Insufficient documentation

## 2015-03-01 DIAGNOSIS — I2510 Atherosclerotic heart disease of native coronary artery without angina pectoris: Secondary | ICD-10-CM | POA: Diagnosis not present

## 2015-03-01 DIAGNOSIS — I10 Essential (primary) hypertension: Secondary | ICD-10-CM | POA: Diagnosis not present

## 2015-03-01 DIAGNOSIS — G4730 Sleep apnea, unspecified: Secondary | ICD-10-CM | POA: Diagnosis not present

## 2015-03-01 DIAGNOSIS — J45909 Unspecified asthma, uncomplicated: Secondary | ICD-10-CM | POA: Insufficient documentation

## 2015-03-01 DIAGNOSIS — Z79899 Other long term (current) drug therapy: Secondary | ICD-10-CM | POA: Insufficient documentation

## 2015-03-01 DIAGNOSIS — Y9289 Other specified places as the place of occurrence of the external cause: Secondary | ICD-10-CM | POA: Diagnosis not present

## 2015-03-01 DIAGNOSIS — Z7982 Long term (current) use of aspirin: Secondary | ICD-10-CM | POA: Insufficient documentation

## 2015-03-01 DIAGNOSIS — I509 Heart failure, unspecified: Secondary | ICD-10-CM | POA: Diagnosis not present

## 2015-03-01 DIAGNOSIS — E119 Type 2 diabetes mellitus without complications: Secondary | ICD-10-CM | POA: Insufficient documentation

## 2015-03-01 DIAGNOSIS — T148 Other injury of unspecified body region: Secondary | ICD-10-CM | POA: Insufficient documentation

## 2015-03-01 DIAGNOSIS — Z86010 Personal history of colonic polyps: Secondary | ICD-10-CM | POA: Diagnosis not present

## 2015-03-01 DIAGNOSIS — S199XXA Unspecified injury of neck, initial encounter: Secondary | ICD-10-CM | POA: Insufficient documentation

## 2015-03-01 DIAGNOSIS — Z87891 Personal history of nicotine dependence: Secondary | ICD-10-CM | POA: Insufficient documentation

## 2015-03-01 DIAGNOSIS — S3992XA Unspecified injury of lower back, initial encounter: Secondary | ICD-10-CM | POA: Insufficient documentation

## 2015-03-01 DIAGNOSIS — S4991XA Unspecified injury of right shoulder and upper arm, initial encounter: Secondary | ICD-10-CM | POA: Diagnosis not present

## 2015-03-01 DIAGNOSIS — M1990 Unspecified osteoarthritis, unspecified site: Secondary | ICD-10-CM | POA: Diagnosis not present

## 2015-03-01 DIAGNOSIS — S299XXA Unspecified injury of thorax, initial encounter: Secondary | ICD-10-CM | POA: Insufficient documentation

## 2015-03-01 DIAGNOSIS — Z9981 Dependence on supplemental oxygen: Secondary | ICD-10-CM | POA: Diagnosis not present

## 2015-03-01 DIAGNOSIS — Z9861 Coronary angioplasty status: Secondary | ICD-10-CM | POA: Diagnosis not present

## 2015-03-01 DIAGNOSIS — R110 Nausea: Secondary | ICD-10-CM | POA: Insufficient documentation

## 2015-03-01 DIAGNOSIS — E663 Overweight: Secondary | ICD-10-CM | POA: Diagnosis not present

## 2015-03-01 DIAGNOSIS — Z853 Personal history of malignant neoplasm of breast: Secondary | ICD-10-CM | POA: Insufficient documentation

## 2015-03-01 DIAGNOSIS — S8991XA Unspecified injury of right lower leg, initial encounter: Secondary | ICD-10-CM | POA: Insufficient documentation

## 2015-03-01 DIAGNOSIS — I252 Old myocardial infarction: Secondary | ICD-10-CM | POA: Diagnosis not present

## 2015-03-01 DIAGNOSIS — W182XXA Fall in (into) shower or empty bathtub, initial encounter: Secondary | ICD-10-CM | POA: Insufficient documentation

## 2015-03-01 DIAGNOSIS — S0990XA Unspecified injury of head, initial encounter: Secondary | ICD-10-CM | POA: Diagnosis not present

## 2015-03-01 NOTE — ED Notes (Signed)
The pt slipped and fell in the bathtub tonight while cleaning.  Head pain rt shoulder and rt knee pain ?? loc

## 2015-03-02 ENCOUNTER — Emergency Department (HOSPITAL_COMMUNITY): Payer: Medicare Other

## 2015-03-02 ENCOUNTER — Emergency Department (HOSPITAL_COMMUNITY)
Admission: EM | Admit: 2015-03-02 | Discharge: 2015-03-02 | Disposition: A | Discharge status: Home / Self Care | Payer: Medicare Other | Attending: Emergency Medicine | Admitting: Emergency Medicine

## 2015-03-02 DIAGNOSIS — W19XXXA Unspecified fall, initial encounter: Secondary | ICD-10-CM

## 2015-03-02 DIAGNOSIS — T148XXA Other injury of unspecified body region, initial encounter: Secondary | ICD-10-CM

## 2015-03-02 DIAGNOSIS — S4991XA Unspecified injury of right shoulder and upper arm, initial encounter: Secondary | ICD-10-CM | POA: Diagnosis not present

## 2015-03-02 DIAGNOSIS — M25511 Pain in right shoulder: Secondary | ICD-10-CM

## 2015-03-02 MED ORDER — IBUPROFEN 400 MG PO TABS
600.0000 mg | ORAL_TABLET | Freq: Once | ORAL | Status: AC
  Administered 2015-03-02: 600 mg via ORAL
  Filled 2015-03-02 (×2): qty 1

## 2015-03-02 NOTE — ED Notes (Signed)
Patient transported to X-ray 

## 2015-03-02 NOTE — ED Provider Notes (Signed)
CSN: [id number]     Arrival date & time 03/01/15  2313 History  This chart was scribed for Debby Freiberg, MD by Peyton Bottoms, ED Scribe. This patient was seen in room D34C/D34C and the patient's care was started at 12:24 AM.   Chief Complaint  Patient presents with  . Fall   Patient is a 64 y.o. female presenting with fall. The history is provided by the patient. No language interpreter was used.  Fall Associated symptoms include headaches. Pertinent negatives include no chest pain and no shortness of breath.    HPI Comments: Patricia Cuevas is a 64 y.o. female with a PMHx of hypertension, CABG, diabetes, dyslipidemia, CAD, arthritis, Breast cancer, chronic back pain and sciatica, who presents to the Emergency Department complaining of moderate pain to right shoulder, right knee, back and headache onset 2 hours ago s/p fall in the bathtub while cleaning. Mechanical fall, no syncope or other concerning history.  She denies associated LOC. Pain is exacerbated by movement. Patient is not currently taking blood thinning medications. She denies associated chest pain or shortness of breath.   Past Medical History  Diagnosis Date  . Hx of CABG     2007  . Hypertension   . Diabetes mellitus   . Ejection fraction     EF 65%, echo, [place], Jan 16, 2011  . Dyslipidemia   . CAD (coronary artery disease)     Nuclear stress test Feb 11, 2011, EF 64%, no scar or ischemia    //         catheterization, November, 2011,patent LIMA-LAD-small caliber distal vessel with diffuse plaque, patent SVG to OM1 and OM 2, patent SVG to PDA and PLA, occluded SVG to diagonal, medical therapy;  Lexiscan Myoview 6/14:  Inferior thinning, no ischemia, EF 61%, low risk  . Overweight(278.02)     stomach stapling 1985 followed by weight loss, then return of weight  . Tobacco abuse     in the past, resolved  . Alcohol abuse     in the past, resolved  . Nausea & vomiting     hospitalization, November, 2011,  stricture GE junction, questionable stenosis gastrojejunostomy, disruptive primary peristaltic wave, GE reflux, delayed emptying proximal gastric pouch  . Hx of colonic polyps   . Asthma   . Myocardial infarction   . Breast cancer     Double mastectomy 2007; s/p tamoxifen and arimidex therapy; no recurrence since 05/2008  . Sleep apnea     CPAP being arranged May, 2011//does not use CPAP  . Arthritis   . Blood transfusion without reported diagnosis 1984    after surgery  . Hyperlipidemia   . CHF (congestive heart failure)   . Chronic back pain   . Sciatica   . Normal cardiac stress test 02/2013   Past Surgical History  Procedure Laterality Date  . Coronary artery bypass graft  2009  . Mastectomy Bilateral 1987  . Coronary stent placement    . Abdominal hysterectomy  1989  . Gastric bypass  1983   Family History  Problem Relation Age of Onset  . Hypertension Mother   . Heart disease Mother   . Diabetes Mother   . Heart disease Father   . Colon cancer Father     does not know age of onset  . Heart disease Sister   . Colon cancer Sister 46  . Stomach cancer Sister   . Asthma Sister   . Esophageal cancer Neg Hx   .  Rectal cancer Neg Hx    History  Substance Use Topics  . Smoking status: Former Smoker -- 3.00 packs/day for 27 years    Types: Cigarettes    Quit date: 09/14/2000  . Smokeless tobacco: Never Used  . Alcohol Use: No     Comment: History of alcohol abuse but quit 5 years ago   OB History    No data available     Review of Systems  Constitutional: Negative for fever.  Respiratory: Negative for shortness of breath.   Cardiovascular: Negative for chest pain.  Musculoskeletal: Positive for back pain, arthralgias and neck pain.  Neurological: Positive for headaches. Negative for syncope.  All other systems reviewed and are negative.  Allergies  Review of patient's allergies indicates no known allergies.  Home Medications   Prior to Admission  medications   Medication Sig Start Date End Date Taking? Authorizing Provider  albuterol (PROVENTIL HFA;VENTOLIN HFA) 108 (90 BASE) MCG/ACT inhaler Inhale 2 puffs into the lungs every 6 (six) hours as needed for shortness of breath.    Yes Historical Provider, MD  aspirin EC 81 MG tablet Take 81 mg by mouth every other day.    Yes Historical Provider, MD  atorvastatin (LIPITOR) 40 MG tablet Take 1 tablet (40 mg total) by mouth daily. 05/11/14  Yes [name], MD  isosorbide mononitrate (IMDUR) 30 MG 24 hr tablet Take 1 tablet (30 mg total) by mouth daily. 11/12/14  Yes [name], PA-C  lisinopril-hydrochlorothiazide (PRINZIDE,ZESTORETIC) 20-12.5 MG per tablet Take 1 tablet by mouth daily.     Yes Historical Provider, MD  metoprolol tartrate (LOPRESSOR) 25 MG tablet Take 1 tablet (25 mg total) by mouth daily. 11/12/14  Yes [name], PA-C  nitroGLYCERIN (NITROSTAT) 0.4 MG SL tablet Place 1 tablet (0.4 mg total) under the tongue every 5 (five) minutes as needed for chest pain. 11/12/14  Yes [name], PA-C  acetaminophen-codeine 120-12 MG/5ML solution Take 10 mLs by mouth every 4 (four) hours as needed for moderate pain. Patient not taking: Reported on 03/02/2015 05/17/14   Dalia Heading, PA-C  cephALEXin (KEFLEX) 500 MG capsule Take 1 capsule (500 mg total) by mouth 3 (three) times daily. Patient not taking: Reported on 03/02/2015 12/03/14   Jeannett Senior, PA-C  Cetirizine HCl 10 MG CAPS Take 1 capsule (10 mg total) by mouth daily. Patient not taking: Reported on 03/02/2015 12/01/14   [name], MD   Triage Vitals: BP 135/72 mmHg  Pulse 60  Temp(Src) 98.3 F (36.8 C) (Oral)  Resp 16  SpO2 93%  Physical Exam  Constitutional: She is oriented to person, place, and time. She appears well-developed and well-nourished.  HENT:  Head: Normocephalic and atraumatic.  Right Ear: External ear normal.  Left Ear: External ear normal.  Eyes: Conjunctivae and EOM are normal.  Pupils are equal, round, and reactive to light.  Neck: Normal range of motion. Neck supple.  Cardiovascular: Normal rate, regular rhythm, normal heart sounds and intact distal pulses.   Pulmonary/Chest: Effort normal and breath sounds normal.  Abdominal: Soft. Bowel sounds are normal. There is no tenderness.  Musculoskeletal: Normal range of motion.       Right shoulder: She exhibits tenderness (over R clavicle).       Right knee: She exhibits normal range of motion. Tenderness found. Lateral joint line tenderness noted.       Cervical back: She exhibits tenderness and bony tenderness.       Thoracic back: She  exhibits tenderness and bony tenderness.       Lumbar back: She exhibits tenderness and bony tenderness.  Neurological: She is alert and oriented to person, place, and time.  Skin: Skin is warm and dry.  Vitals reviewed.   ED Course  Procedures (including critical care time)  DIAGNOSTIC STUDIES: Oxygen Saturation is 93% on RA, low by my interpretation.    COORDINATION OF CARE: 12:28 AM- Discussed plans to order diagnostic imaging of CT of head and c-spine. Will also order xray images of thoracic and lumbar spines, right knee and hip unilat with pelvis. Pt advised of plan for treatment and pt agrees.  Labs Review Labs Reviewed - No data to display  Imaging Review Dg Thoracic Spine 2 View  03/02/2015   CLINICAL DATA:  [place] in bathtub, landing on right side.  Pain.  EXAM: THORACIC SPINE - 2 VIEW  COMPARISON:  Two-view chest 07/26/2014  FINDINGS: Diffuse degenerative change throughout the thoracic spine with narrowed thoracic interspaces and associated endplate hypertrophic changes. Degenerative disc disease at multiple levels. Normal alignment. No anterior subluxation. No vertebral compression deformities. No focal bone lesion or bone destruction. Bone cortex and trabecular architecture appear intact. No paraspinal soft tissue swelling. Postoperative changes in the mediastinum and  left upper quadrant. Elevation of the hemidiaphragms.  IMPRESSION: Degenerative changes throughout the thoracic spine. Normal alignment. No acute displaced fractures.   Electronically Signed   By: Lucienne Capers M.D.   On: 03/02/2015 01:25   Dg Lumbar Spine Complete  03/02/2015   CLINICAL DATA:  [place] in bathtub landing on right side.  Pain.  EXAM: LUMBAR SPINE - COMPLETE 4+ VIEW  COMPARISON:  CT abdomen and pelvis 08/16/2014  FINDINGS: Degenerative changes throughout the lumbar spine with narrowed lumbar interspaces and associated endplate hypertrophic changes. Diffuse bone demineralization. Normal alignment. No vertebral compression deformities. No focal bone lesion or bone destruction. Bone cortex and trabecular architecture appear intact. Postoperative changes in the abdomen.  IMPRESSION: Degenerative changes in the lumbar spine. Normal alignment. No acute displaced fractures identified.   Electronically Signed   By: Lucienne Capers M.D.   On: 03/02/2015 01:26   Dg Shoulder Right  03/02/2015   CLINICAL DATA:  64 year old female who fell in bathtub with right shoulder pain. Initial encounter.  EXAM: RIGHT SHOULDER - 2+ VIEW  COMPARISON:  11/07/2013.  FINDINGS: Two views. Right axillary and right chest wall surgical clips re- identified. Chronic degenerative changes about the right shoulder. No glenohumeral joint dislocation. The proximal right humerus appears stable and intact. No right clavicle or scapula fracture identified. Visible right ribs appear intact.  IMPRESSION: No acute fracture or dislocation identified about the right shoulder.   Electronically Signed   By: Genevie Ann M.D.   On: 03/02/2015 02:14   Ct Head Wo Contrast  03/02/2015   CLINICAL DATA:  64 year old female who fell in the bathtub with acute pain. Initial encounter.  EXAM: CT HEAD WITHOUT CONTRAST  CT CERVICAL SPINE WITHOUT CONTRAST  TECHNIQUE: Multidetector CT imaging of the head and cervical spine was performed following the  standard protocol without intravenous contrast. Multiplanar CT image reconstructions of the cervical spine were also generated.  COMPARISON:  Head CTs 12/01/2014 and earlier.  [facility] cervical spine CT 04/27/2008  FINDINGS: CT HEAD FINDINGS  Stable visible paranasal sinuses and mastoids. Visualized orbit soft tissues are within normal limits. No scalp hematoma identified. Calvarium intact.  Calcified atherosclerosis at the skull base. Cerebral volume remains normal for age.  No ventriculomegaly. No midline shift, mass effect, or evidence of intracranial mass lesion. No evidence of cortically based acute infarction identified. Gray-white matter differentiation remains normal. No suspicious intracranial vascular hyperdensity. No acute intracranial hemorrhage identified.  CT CERVICAL SPINE FINDINGS  Large body habitus. Chronic reversal of cervical lordosis. Advanced chronic cervical disc and endplate degeneration, severe at C5-C6 and C6-C7. Chronic anterior C1 odontoid degeneration. Visualized skull base is intact. No atlanto-occipital dissociation. Cervicothoracic junction alignment is within normal limits. Bilateral posterior element alignment is within normal limits. Visualized upper thoracic levels appear grossly intact. There is mild chronic anterolisthesis at T1-T2 associated with chronic moderate to severe facet hypertrophy. No acute cervical spine fracture identified. Chronic spinal stenosis at C5-C6 and C6-C7.  Partially visible median sternotomy. Negative lung apices aside from atelectasis. Tortuous great vessels and calcified atherosclerosis at the thoracic inlet. Noncontrast paraspinal soft tissues within normal limits.  IMPRESSION: 1. Stable and normal for age non contrast CT appearance of the brain. 2. No acute fracture or listhesis identified in the cervical spine. Ligamentous injury is not excluded. 3. Chronic advanced cervical spine degeneration, with chronic mild spinal stenosis at  C5-C6 and C6-C7.   Electronically Signed   By: Genevie Ann M.D.   On: 03/02/2015 01:35   Ct Cervical Spine Wo Contrast  03/02/2015   CLINICAL DATA:  64 year old female who fell in the bathtub with acute pain. Initial encounter.  EXAM: CT HEAD WITHOUT CONTRAST  CT CERVICAL SPINE WITHOUT CONTRAST  TECHNIQUE: Multidetector CT imaging of the head and cervical spine was performed following the standard protocol without intravenous contrast. Multiplanar CT image reconstructions of the cervical spine were also generated.  COMPARISON:  Head CTs 12/01/2014 and earlier.  [facility] cervical spine CT 04/27/2008  FINDINGS: CT HEAD FINDINGS  Stable visible paranasal sinuses and mastoids. Visualized orbit soft tissues are within normal limits. No scalp hematoma identified. Calvarium intact.  Calcified atherosclerosis at the skull base. Cerebral volume remains normal for age. No ventriculomegaly. No midline shift, mass effect, or evidence of intracranial mass lesion. No evidence of cortically based acute infarction identified. Gray-white matter differentiation remains normal. No suspicious intracranial vascular hyperdensity. No acute intracranial hemorrhage identified.  CT CERVICAL SPINE FINDINGS  Large body habitus. Chronic reversal of cervical lordosis. Advanced chronic cervical disc and endplate degeneration, severe at C5-C6 and C6-C7. Chronic anterior C1 odontoid degeneration. Visualized skull base is intact. No atlanto-occipital dissociation. Cervicothoracic junction alignment is within normal limits. Bilateral posterior element alignment is within normal limits. Visualized upper thoracic levels appear grossly intact. There is mild chronic anterolisthesis at T1-T2 associated with chronic moderate to severe facet hypertrophy. No acute cervical spine fracture identified. Chronic spinal stenosis at C5-C6 and C6-C7.  Partially visible median sternotomy. Negative lung apices aside from atelectasis. Tortuous great  vessels and calcified atherosclerosis at the thoracic inlet. Noncontrast paraspinal soft tissues within normal limits.  IMPRESSION: 1. Stable and normal for age non contrast CT appearance of the brain. 2. No acute fracture or listhesis identified in the cervical spine. Ligamentous injury is not excluded. 3. Chronic advanced cervical spine degeneration, with chronic mild spinal stenosis at C5-C6 and C6-C7.   Electronically Signed   By: Genevie Ann M.D.   On: 03/02/2015 01:35   Dg Knee Complete 4 Views Right  03/02/2015   CLINICAL DATA:  [place] in bathtub landing on right side.  Pain.  EXAM: RIGHT KNEE - COMPLETE 4+ VIEW  COMPARISON:  11/07/2013  FINDINGS: Degenerative changes in the right  knee with tricompartment narrowing and osteophytosis, greatest in the medial compartment. Chondral calcifications. No evidence of acute fracture or dislocation. No significant effusion. No bone lesion or bone destruction. Surgical clips in the medial posterior soft tissues.  IMPRESSION: Prominent tricompartment degenerative changes in the right knee. No acute bony abnormalities.   Electronically Signed   By: Lucienne Capers M.D.   On: 03/02/2015 01:28   Dg Hip Unilat With Pelvis 2-3 Views Right  03/02/2015   CLINICAL DATA:  [place] in bathtub landing on the right side.  Pain.  EXAM: RIGHT HIP (WITH PELVIS) 2-3 VIEWS  COMPARISON:  None.  FINDINGS: Degenerative changes in the lower lumbar spine and in both hips. No evidence of acute fracture or dislocation in the pelvis or right hip. SI joints and symphysis pubis are not displaced.  IMPRESSION: Degenerative changes.  No acute bony abnormalities.   Electronically Signed   By: Lucienne Capers M.D.   On: 03/02/2015 01:29     EKG Interpretation None     MDM   Final diagnoses:  Fall  Contusion  Right shoulder pain    65 y.o. female with pertinent PMH of CHF, chronic back pain, CAD presents with mechanical fall in the bathtub.  Pt did hit head and has ha and nausea, but no  vision changes.  Rest of exam as above.  Wu as above for tender areas without acute pathology.  Likely contusion.  No signs of occult syncope, other etiology of fall, and pt is sore, but able to ambulate and full range all joints.  States to her family "See? I told you we shouldn't have come."  She does appear happy with care and agrees to fu.  DC home in stable condition.    I have reviewed all laboratory and imaging studies if ordered as above  1. Contusion   2. Fall   3. Right shoulder pain          Debby Freiberg, MD 03/02/15 [phone]

## 2015-03-02 NOTE — Discharge Instructions (Signed)
Contusion °A contusion is a deep bruise. Contusions are the result of an injury that caused bleeding under the skin. The contusion may turn blue, purple, or yellow. Minor injuries will give you a painless contusion, but more severe contusions may stay painful and swollen for a few weeks.  °CAUSES  °A contusion is usually caused by a blow, trauma, or direct force to an area of the body. °SYMPTOMS  °· Swelling and redness of the injured area. °· Bruising of the injured area. °· Tenderness and soreness of the injured area. °· Pain. °DIAGNOSIS  °The diagnosis can be made by taking a history and physical exam. An X-ray, CT scan, or MRI may be needed to determine if there were any associated injuries, such as fractures. °TREATMENT  °Specific treatment will depend on what area of the body was injured. In general, the best treatment for a contusion is resting, icing, elevating, and applying cold compresses to the injured area. Over-the-counter medicines may also be recommended for pain control. Ask your caregiver what the best treatment is for your contusion. °HOME CARE INSTRUCTIONS  °· Put ice on the injured area. °¨ Put ice in a plastic bag. °¨ Place a towel between your skin and the bag. °¨ Leave the ice on for 15-20 minutes, 3-4 times a day, or as directed by your health care provider. °· Only take over-the-counter or prescription medicines for pain, discomfort, or fever as directed by your caregiver. Your caregiver may recommend avoiding anti-inflammatory medicines (aspirin, ibuprofen, and naproxen) for 48 hours because these medicines may increase bruising. °· Rest the injured area. °· If possible, elevate the injured area to reduce swelling. °SEEK IMMEDIATE MEDICAL CARE IF:  °· You have increased bruising or swelling. °· You have pain that is getting worse. °· Your swelling or pain is not relieved with medicines. °MAKE SURE YOU:  °· Understand these instructions. °· Will watch your condition. °· Will get help right  away if you are not doing well or get worse. °Document Released: 06/10/2005 Document Revised: 09/05/2013 Document Reviewed: 07/06/2011 °Exit[employer]® Patient Information ©2015 [employer]. This information is not intended to replace advice given to you by your health care provider. Make sure you discuss any questions you have with your health care provider. ° °

## 2015-03-31 ENCOUNTER — Emergency Department (HOSPITAL_COMMUNITY)
Admission: EM | Admit: 2015-03-31 | Discharge: 2015-03-31 | Disposition: A | Discharge status: Home / Self Care | Payer: Medicare Other | Attending: Emergency Medicine | Admitting: Emergency Medicine

## 2015-03-31 ENCOUNTER — Encounter (HOSPITAL_COMMUNITY): Payer: Self-pay | Admitting: Emergency Medicine

## 2015-03-31 DIAGNOSIS — I2510 Atherosclerotic heart disease of native coronary artery without angina pectoris: Secondary | ICD-10-CM | POA: Insufficient documentation

## 2015-03-31 DIAGNOSIS — Z9981 Dependence on supplemental oxygen: Secondary | ICD-10-CM | POA: Diagnosis not present

## 2015-03-31 DIAGNOSIS — E119 Type 2 diabetes mellitus without complications: Secondary | ICD-10-CM | POA: Insufficient documentation

## 2015-03-31 DIAGNOSIS — Z87891 Personal history of nicotine dependence: Secondary | ICD-10-CM | POA: Diagnosis not present

## 2015-03-31 DIAGNOSIS — E785 Hyperlipidemia, unspecified: Secondary | ICD-10-CM | POA: Insufficient documentation

## 2015-03-31 DIAGNOSIS — Z86010 Personal history of colonic polyps: Secondary | ICD-10-CM | POA: Insufficient documentation

## 2015-03-31 DIAGNOSIS — N390 Urinary tract infection, site not specified: Secondary | ICD-10-CM | POA: Diagnosis not present

## 2015-03-31 DIAGNOSIS — Z9861 Coronary angioplasty status: Secondary | ICD-10-CM | POA: Diagnosis not present

## 2015-03-31 DIAGNOSIS — I252 Old myocardial infarction: Secondary | ICD-10-CM | POA: Diagnosis not present

## 2015-03-31 DIAGNOSIS — M1990 Unspecified osteoarthritis, unspecified site: Secondary | ICD-10-CM | POA: Insufficient documentation

## 2015-03-31 DIAGNOSIS — J45909 Unspecified asthma, uncomplicated: Secondary | ICD-10-CM | POA: Diagnosis not present

## 2015-03-31 DIAGNOSIS — G4730 Sleep apnea, unspecified: Secondary | ICD-10-CM | POA: Diagnosis not present

## 2015-03-31 DIAGNOSIS — E663 Overweight: Secondary | ICD-10-CM | POA: Insufficient documentation

## 2015-03-31 DIAGNOSIS — Z951 Presence of aortocoronary bypass graft: Secondary | ICD-10-CM | POA: Insufficient documentation

## 2015-03-31 DIAGNOSIS — R300 Dysuria: Secondary | ICD-10-CM | POA: Diagnosis present

## 2015-03-31 DIAGNOSIS — I509 Heart failure, unspecified: Secondary | ICD-10-CM | POA: Insufficient documentation

## 2015-03-31 DIAGNOSIS — G8929 Other chronic pain: Secondary | ICD-10-CM | POA: Diagnosis not present

## 2015-03-31 DIAGNOSIS — Z79899 Other long term (current) drug therapy: Secondary | ICD-10-CM | POA: Diagnosis not present

## 2015-03-31 DIAGNOSIS — Z853 Personal history of malignant neoplasm of breast: Secondary | ICD-10-CM | POA: Diagnosis not present

## 2015-03-31 DIAGNOSIS — I10 Essential (primary) hypertension: Secondary | ICD-10-CM | POA: Diagnosis not present

## 2015-03-31 LAB — URINALYSIS, ROUTINE W REFLEX MICROSCOPIC
Glucose, UA: NEGATIVE mg/dL
Ketones, ur: 15 mg/dL — AB
Nitrite: POSITIVE — AB
Protein, ur: 100 mg/dL — AB
SPECIFIC GRAVITY, URINE: 1.025 (ref 1.005–1.030)
UROBILINOGEN UA: 1 mg/dL (ref 0.0–1.0)
pH: 5.5 (ref 5.0–8.0)

## 2015-03-31 LAB — URINE MICROSCOPIC-ADD ON

## 2015-03-31 MED ORDER — HYDROCODONE-ACETAMINOPHEN 5-325 MG PO TABS: 1.0000 | ORAL_TABLET | Freq: Four times a day (QID) | ORAL | Status: DC | PRN

## 2015-03-31 MED ORDER — HYDROCODONE-ACETAMINOPHEN 5-325 MG PO TABS
1.0000 | ORAL_TABLET | Freq: Once | ORAL | Status: AC
  Administered 2015-03-31: 1 via ORAL
  Filled 2015-03-31: qty 1

## 2015-03-31 MED ORDER — CEPHALEXIN 500 MG PO CAPS: 500.0000 mg | ORAL_CAPSULE | Freq: Two times a day (BID) | ORAL | Status: DC

## 2015-03-31 NOTE — ED Provider Notes (Signed)
CSN: [id number]     Arrival date & time 03/31/15  0847 History   First MD Initiated Contact with Patient 03/31/15 [phone]     Chief Complaint  Patient presents with  . Abdominal Pain  . Dysuria     (Consider location/radiation/quality/duration/timing/severity/associated sxs/prior Treatment) HPI Comments: Lower abdominal pain and dysuria times 2 days. No fever, vomiting or diarrhea.she states that she is no having vaginal discharge or bleeding. She states that she has urinary frequency and few drops came out. States that it feels similar to previous uti.  The history is provided by the patient. No language interpreter was used.    Past Medical History  Diagnosis Date  . Hx of CABG     2007  . Hypertension   . Diabetes mellitus   . Ejection fraction     EF 65%, echo, [place], Jan 16, 2011  . Dyslipidemia   . CAD (coronary artery disease)     Nuclear stress test Feb 11, 2011, EF 64%, no scar or ischemia    //         catheterization, November, 2011,patent LIMA-LAD-small caliber distal vessel with diffuse plaque, patent SVG to OM1 and OM 2, patent SVG to PDA and PLA, occluded SVG to diagonal, medical therapy;  Lexiscan Myoview 6/14:  Inferior thinning, no ischemia, EF 61%, low risk  . Overweight(278.02)     stomach stapling 1985 followed by weight loss, then return of weight  . Tobacco abuse     in the past, resolved  . Alcohol abuse     in the past, resolved  . Nausea & vomiting     hospitalization, November, 2011, stricture GE junction, questionable stenosis gastrojejunostomy, disruptive primary peristaltic wave, GE reflux, delayed emptying proximal gastric pouch  . Hx of colonic polyps   . Asthma   . Myocardial infarction   . Breast cancer     Double mastectomy 2007; s/p tamoxifen and arimidex therapy; no recurrence since 05/2008  . Sleep apnea     CPAP being arranged May, 2011//does not use CPAP  . Arthritis   . Blood transfusion without reported diagnosis 1984    after  surgery  . Hyperlipidemia   . CHF (congestive heart failure)   . Chronic back pain   . Sciatica   . Normal cardiac stress test 02/2013   Past Surgical History  Procedure Laterality Date  . Coronary artery bypass graft  2009  . Mastectomy Bilateral 1987  . Coronary stent placement    . Abdominal hysterectomy  1989  . Gastric bypass  1983   Family History  Problem Relation Age of Onset  . Hypertension Mother   . Heart disease Mother   . Diabetes Mother   . Heart disease Father   . Colon cancer Father     does not know age of onset  . Heart disease Sister   . Colon cancer Sister 68  . Stomach cancer Sister   . Asthma Sister   . Esophageal cancer Neg Hx   . Rectal cancer Neg Hx    History  Substance Use Topics  . Smoking status: Former Smoker -- 3.00 packs/day for 27 years    Types: Cigarettes    Quit date: 09/14/2000  . Smokeless tobacco: Never Used  . Alcohol Use: No     Comment: History of alcohol abuse but quit 5 years ago   OB History    No data available     Review of Systems  All other  systems reviewed and are negative.     Allergies  Review of patient's allergies indicates no known allergies.  Home Medications   Prior to Admission medications   Medication Sig Start Date End Date Taking? Authorizing Provider  albuterol (PROVENTIL HFA;VENTOLIN HFA) 108 (90 BASE) MCG/ACT inhaler Inhale 2 puffs into the lungs every 6 (six) hours as needed for shortness of breath.    Yes Historical Provider, MD  aspirin EC 81 MG tablet Take 81 mg by mouth every other day.    Yes Historical Provider, MD  atorvastatin (LIPITOR) 40 MG tablet Take 1 tablet (40 mg total) by mouth daily. 05/11/14  Yes [name], MD  isosorbide mononitrate (IMDUR) 30 MG 24 hr tablet Take 1 tablet (30 mg total) by mouth daily. 11/12/14  Yes [name], PA-C  lisinopril-hydrochlorothiazide (PRINZIDE,ZESTORETIC) 20-12.5 MG per tablet Take 1 tablet by mouth daily.     Yes Historical Provider, MD   metoprolol tartrate (LOPRESSOR) 25 MG tablet Take 1 tablet (25 mg total) by mouth daily. 11/12/14  Yes [name], PA-C  nitroGLYCERIN (NITROSTAT) 0.4 MG SL tablet Place 1 tablet (0.4 mg total) under the tongue every 5 (five) minutes as needed for chest pain. 11/12/14  Yes [name], PA-C  acetaminophen-codeine 120-12 MG/5ML solution Take 10 mLs by mouth every 4 (four) hours as needed for moderate pain. Patient not taking: Reported on 03/02/2015 05/17/14   Dalia Heading, PA-C  cephALEXin (KEFLEX) 500 MG capsule Take 1 capsule (500 mg total) by mouth 3 (three) times daily. Patient not taking: Reported on 03/02/2015 12/03/14   Jeannett Senior, PA-C  Cetirizine HCl 10 MG CAPS Take 1 capsule (10 mg total) by mouth daily. Patient not taking: Reported on 03/02/2015 12/01/14   [name], MD   BP 139/73 mmHg  Pulse 86  Temp(Src) 97.8 F (36.6 C) (Oral)  SpO2 98% Physical Exam  Constitutional: She is oriented to person, place, and time. She appears well-developed and well-nourished.  Cardiovascular: Normal rate and regular rhythm.   Pulmonary/Chest: Effort normal and breath sounds normal.  Abdominal: Soft. Bowel sounds are normal. There is no tenderness. There is no CVA tenderness.  Musculoskeletal: Normal range of motion.  Neurological: She is alert and oriented to person, place, and time.  Skin: Skin is warm and dry.  Psychiatric: She has a normal mood and affect.  Nursing note and vitals reviewed.   ED Course  Procedures (including critical care time) Labs Review Labs Reviewed  URINALYSIS, ROUTINE W REFLEX MICROSCOPIC (NOT AT [facility]) - Abnormal; Notable for the following:    Color, Urine RED (*)    APPearance TURBID (*)    Hgb urine dipstick LARGE (*)    Bilirubin Urine MODERATE (*)    Ketones, ur 15 (*)    Protein, ur 100 (*)    Nitrite POSITIVE (*)    Leukocytes, UA LARGE (*)    All other components within normal limits  URINE MICROSCOPIC-ADD ON - Abnormal;  Notable for the following:    Bacteria, UA MANY (*)    All other components within normal limits    Imaging Review No results found.   EKG Interpretation None      MDM   Final diagnoses:  UTI (lower urinary tract infection)    Will treat for a simple uti.urine sent for culture. Vital stable. Will send home on keflex and hydrocodone for pain. Discussed return precautions with pt. Abdomen is benign at this time    Glendell Docker,  NP 03/31/15 2202  Leonard Schwartz, MD 03/31/15 [phone]

## 2015-03-31 NOTE — ED Notes (Signed)
Pt from home with c/o lower abdominal pain x 2 days with urination.  Pt reports going frequently but when she goes "only a few drops comes out."  Unkown to pt the color or if there is change in smell.  Denies vaginal bleeding or discharge.  Pt in NAD, A&O, ambulatory.

## 2015-03-31 NOTE — Discharge Instructions (Signed)
Follow up with your doctor or return here for continued or worsening symptoms Urinary Tract Infection Urinary tract infections (UTIs) can develop anywhere along your urinary tract. Your urinary tract is your body's drainage system for removing wastes and extra water. Your urinary tract includes two kidneys, two ureters, a bladder, and a urethra. Your kidneys are a pair of bean-shaped organs. Each kidney is about the size of your fist. They are located below your ribs, one on each side of your spine. CAUSES Infections are caused by microbes, which are microscopic organisms, including fungi, viruses, and bacteria. These organisms are so small that they can only be seen through a microscope. Bacteria are the microbes that most commonly cause UTIs. SYMPTOMS  Symptoms of UTIs may vary by age and gender of the patient and by the location of the infection. Symptoms in young women typically include a frequent and intense urge to urinate and a painful, burning feeling in the bladder or urethra during urination. Older women and men are more likely to be tired, shaky, and weak and have muscle aches and abdominal pain. A fever may mean the infection is in your kidneys. Other symptoms of a kidney infection include pain in your back or sides below the ribs, nausea, and vomiting. DIAGNOSIS To diagnose a UTI, your caregiver will ask you about your symptoms. Your caregiver also will ask to provide a urine sample. The urine sample will be tested for bacteria and white blood cells. White blood cells are made by your body to help fight infection. TREATMENT  Typically, UTIs can be treated with medication. Because most UTIs are caused by a bacterial infection, they usually can be treated with the use of antibiotics. The choice of antibiotic and length of treatment depend on your symptoms and the type of bacteria causing your infection. HOME CARE INSTRUCTIONS  If you were prescribed antibiotics, take them exactly as your  caregiver instructs you. Finish the medication even if you feel better after you have only taken some of the medication.  Drink enough water and fluids to keep your urine clear or pale yellow.  Avoid caffeine, tea, and carbonated beverages. They tend to irritate your bladder.  Empty your bladder often. Avoid holding urine for long periods of time.  Empty your bladder before and after sexual intercourse.  After a bowel movement, women should cleanse from front to back. Use each tissue only once. SEEK MEDICAL CARE IF:   You have back pain.  You develop a fever.  Your symptoms do not begin to resolve within 3 days. SEEK IMMEDIATE MEDICAL CARE IF:   You have severe back pain or lower abdominal pain.  You develop chills.  You have nausea or vomiting.  You have continued burning or discomfort with urination. MAKE SURE YOU:   Understand these instructions.  Will watch your condition.  Will get help right away if you are not doing well or get worse. Document Released: 06/10/2005 Document Revised: 03/01/2012 Document Reviewed: 10/09/2011 [facility] Patient Information 2015 [place], [place]. This information is not intended to replace advice given to you by your health care provider. Make sure you discuss any questions you have with your health care provider.

## 2015-04-02 LAB — URINE CULTURE: Culture: >=100000

## 2015-04-03 ENCOUNTER — Telehealth (HOSPITAL_BASED_OUTPATIENT_CLINIC_OR_DEPARTMENT_OTHER): Payer: Self-pay | Admitting: Emergency Medicine

## 2015-04-03 NOTE — Telephone Encounter (Signed)
Post ED Visit - Positive Culture Follow-up: Successful Patient Follow-Up  Culture assessed and recommendations reviewed by: []  [name], Pharm.D., BCPS-AQ ID []  Alycia Rossetti, Pharm.D., BCPS []  [place], [place].D., BCPS, AAHIVP []  Legrand Como, Pharm.D., BCPS, AAHIVP []  Tegan Magsam, Pharm.D. []  Levester Fresh, Pharm.D.  Positive Urine culture  []  Patient discharged without antimicrobial prescription and treatment is now indicated [x]  Organism is resistant to prescribed ED discharge antimicrobial []  Patient with positive blood cultures  Changes discussed with ED provider: Dahlia Bailiff, PA New antibiotic prescription:  Macrobid 100 mg PO BID x one week Called to [place] [phone]  Contacted patient, date 04/03/15, time 0922   Ernesta Amble 04/03/2015, 9:19 AM

## 2015-04-03 NOTE — Progress Notes (Signed)
ED Antimicrobial Stewardship Positive Culture Follow Up   Patricia Cuevas is an 64 y.o. female who presented to [facility] on 03/31/2015 with a chief complaint of  Chief Complaint  Patient presents with  . Abdominal Pain  . Dysuria    Recent Results (from the past 720 hour(s))  Urine culture     Status: None   Collection Time: 03/31/15  9:05 AM  Result Value Ref Range Status   Specimen Description URINE, CLEAN CATCH  Final   Special Requests NONE  Final   Culture   Final    >=100,000 COLONIES/mL ESCHERICHIA COLI Confirmed Extended Spectrum Beta-Lactamase Producer (ESBL)    Report Status 04/02/2015 FINAL  Final   Organism ID, Bacteria ESCHERICHIA COLI  Final      Susceptibility   Escherichia coli - MIC*    AMPICILLIN >=32 RESISTANT Resistant     CEFAZOLIN >=64 RESISTANT Resistant     CEFTRIAXONE >=64 RESISTANT Resistant     CIPROFLOXACIN >=4 RESISTANT Resistant     GENTAMICIN <=1 SENSITIVE Sensitive     IMIPENEM <=0.25 SENSITIVE Sensitive     NITROFURANTOIN <=16 SENSITIVE Sensitive     TRIMETH/SULFA >=320 RESISTANT Resistant     AMPICILLIN/SULBACTAM >=32 RESISTANT Resistant     PIP/TAZO 8 SENSITIVE Sensitive     * >=100,000 COLONIES/mL ESCHERICHIA COLI    [x]  Treated with cephalexin, organism resistant to prescribed antimicrobial []  Patient discharged originally without antimicrobial agent and treatment is now indicated  New antibiotic prescription: Discontinue cephalexin. Start macrobid 100 mg BID x 1 week  ED Provider: Jetty Peeks 04/03/2015, 7:59 AM Infectious Diseases Pharmacist Phone# [phone]

## 2015-06-03 ENCOUNTER — Other Ambulatory Visit: Payer: Self-pay

## 2015-06-03 MED ORDER — ATORVASTATIN CALCIUM 40 MG PO TABS: 40.0000 mg | ORAL_TABLET | Freq: Every day | ORAL | Status: DC

## 2015-06-03 NOTE — Telephone Encounter (Signed)
[name], MD at 01/23/2015 4:53 PM  atorvastatin (LIPITOR) 40 MG tabletTake 1 tablet (40 mg total) by mouth daily  Patient Instructions     Medication Instructions:  Same

## 2015-07-24 ENCOUNTER — Other Ambulatory Visit: Payer: Self-pay

## 2015-07-24 MED ORDER — ATORVASTATIN CALCIUM 40 MG PO TABS: 40.0000 mg | ORAL_TABLET | Freq: Every day | ORAL | Status: DC

## 2015-08-16 ENCOUNTER — Encounter: Payer: Self-pay | Admitting: Cardiology

## 2015-08-16 ENCOUNTER — Ambulatory Visit (INDEPENDENT_AMBULATORY_CARE_PROVIDER_SITE_OTHER): Payer: Medicare Other | Admitting: Cardiology

## 2015-08-16 VITALS — BP 130/72 | HR 77 | Ht 59.0 in | Wt 214.8 lb

## 2015-08-16 DIAGNOSIS — E663 Overweight: Secondary | ICD-10-CM | POA: Diagnosis not present

## 2015-08-16 DIAGNOSIS — Z951 Presence of aortocoronary bypass graft: Secondary | ICD-10-CM | POA: Diagnosis not present

## 2015-08-16 DIAGNOSIS — I2583 Coronary atherosclerosis due to lipid rich plaque: Secondary | ICD-10-CM

## 2015-08-16 DIAGNOSIS — I25810 Atherosclerosis of coronary artery bypass graft(s) without angina pectoris: Secondary | ICD-10-CM | POA: Diagnosis not present

## 2015-08-16 DIAGNOSIS — I10 Essential (primary) hypertension: Secondary | ICD-10-CM

## 2015-08-16 DIAGNOSIS — I2510 Atherosclerotic heart disease of native coronary artery without angina pectoris: Secondary | ICD-10-CM

## 2015-08-16 NOTE — Patient Instructions (Signed)

## 2015-08-16 NOTE — Progress Notes (Signed)
Cardiology Office Note   Date:  08/16/2015   ID:  Patricia Cuevas, DOB [birth year], MRN [id number]  PCP:  [name], MD  Cardiologist:   Candee Furbish, MD       History of Present Illness: Patricia Cuevas is a 64 y.o. female former patient of Dr. Ron Parker here for follow-up appointment.  Has coronary disease post bypass surgery in 2007 with catheterization in 2011 demonstrating patent LIMA to LAD, small caliber vessel distally diffuse plaque with patent SVG to OM1 and OM 2, patent SVG to PDA and PLA, occluded SVG to diagonal. Myoview 12/03/14-normal EF, no ischemia.   Prior episode of atypical chest pain in March 2016-resulted in nuclear stress test.  No CP, no SOB, no edema.   Compliant with meds.  Normal EF.   Past Medical History  Diagnosis Date  . Hx of CABG     2007  . Hypertension   . Diabetes mellitus   . Ejection fraction     EF 65%, echo, [place], Jan 16, 2011  . Dyslipidemia   . CAD (coronary artery disease)     Nuclear stress test Feb 11, 2011, EF 64%, no scar or ischemia    //         catheterization, November, 2011,patent LIMA-LAD-small caliber distal vessel with diffuse plaque, patent SVG to OM1 and OM 2, patent SVG to PDA and PLA, occluded SVG to diagonal, medical therapy;  Lexiscan Myoview 6/14:  Inferior thinning, no ischemia, EF 61%, low risk  . Overweight(278.02)     stomach stapling 1985 followed by weight loss, then return of weight  . Tobacco abuse     in the past, resolved  . Alcohol abuse     in the past, resolved  . Nausea & vomiting     hospitalization, November, 2011, stricture GE junction, questionable stenosis gastrojejunostomy, disruptive primary peristaltic wave, GE reflux, delayed emptying proximal gastric pouch  . Hx of colonic polyps   . Asthma   . Myocardial infarction ([place])   . Breast cancer [facility])     Double mastectomy 2007; s/p tamoxifen and arimidex therapy; no recurrence since 05/2008  . Sleep apnea     CPAP being arranged  May, 2011//does not use CPAP  . Arthritis   . Blood transfusion without reported diagnosis 1984    after surgery  . Hyperlipidemia   . CHF (congestive heart failure) ([place])   . Chronic back pain   . Sciatica   . Normal cardiac stress test 02/2013    Past Surgical History  Procedure Laterality Date  . Coronary artery bypass graft  2009  . Mastectomy Bilateral 1987  . Coronary stent placement    . Abdominal hysterectomy  1989  . Gastric bypass  1983     Current Outpatient Prescriptions  Medication Sig Dispense Refill  . albuterol (PROVENTIL HFA;VENTOLIN HFA) 108 (90 BASE) MCG/ACT inhaler Inhale 2 puffs into the lungs every 6 (six) hours as needed for shortness of breath.     [name] Kitchen aspirin EC 81 MG tablet Take 81 mg by mouth every other day.     [name] Kitchen atorvastatin (LIPITOR) 40 MG tablet Take 1 tablet (40 mg total) by mouth daily. 30 tablet 0  . Cetirizine HCl 10 MG CAPS Take 1 capsule (10 mg total) by mouth daily. 30 capsule 0  . HYDROcodone-acetaminophen (NORCO/VICODIN) 5-325 MG per tablet Take 1 tablet by mouth every 6 (six) hours as needed. 6 tablet 0  . isosorbide mononitrate (IMDUR)  30 MG 24 hr tablet Take 1 tablet (30 mg total) by mouth daily. 30 tablet 6  . lisinopril-hydrochlorothiazide (PRINZIDE,ZESTORETIC) 20-12.5 MG per tablet Take 1 tablet by mouth daily.      . metoprolol tartrate (LOPRESSOR) 25 MG tablet Take 1 tablet (25 mg total) by mouth daily. 30 tablet 6  . nitroGLYCERIN (NITROSTAT) 0.4 MG SL tablet Place 1 tablet (0.4 mg total) under the tongue every 5 (five) minutes as needed for chest pain. 25 tablet 3   No current facility-administered medications for this visit.    Allergies:   Review of patient's allergies indicates no known allergies.    Social History:  The patient  reports that she quit smoking about 14 years ago. Her smoking use included Cigarettes. She has a 81 pack-year smoking history. She has never used smokeless tobacco. She reports that she does not drink  alcohol or use illicit drugs.   Family History:  The patient's family history includes Asthma in her sister; Colon cancer in her father; Colon cancer (age of onset: 66) in her sister; Diabetes in her mother; Heart disease in her father, mother, and sister; Hypertension in her mother; Stomach cancer in her sister. There is no history of Esophageal cancer or Rectal cancer.    ROS:  Please see the history of present illness.   Otherwise, review of systems are positive for none.   All other systems are reviewed and negative.    PHYSICAL EXAM: VS:  BP 130/72 mmHg  Pulse 77  Ht 4\' 11"  (1.499 m)  Wt 214 lb 12.8 oz (97.433 kg)  BMI 43.36 kg/m2  SpO2 94% , BMI Body mass index is 43.36 kg/(m^2). GEN: Well nourished, well developed, in no acute distress HEENT: normal Neck: no JVD, carotid bruits, or masses Cardiac: RRR; no murmurs, rubs, or gallops,no edema  Respiratory:  clear to auscultation bilaterally, normal work of breathing GI: soft, nontender, nondistended, + BS, overweight MS: no deformity or atrophy Skin: warm and dry, no rash Neuro:  Strength and sensation are intact Psych: euthymic mood, full affect   EKG:  None today   Recent Labs: 12/03/2014: ALT 18; BUN 8; Creatinine, Ser 0.78; Hemoglobin 13.7; Platelets 192; Potassium 3.9; Sodium 139    Lipid Panel    Component Value Date/Time   CHOL  05/17/2009 0130    139        ATP III CLASSIFICATION:  <200     mg/dL   Desirable  200-239  mg/dL   Borderline High  >=240    mg/dL   High          TRIG 93 05/17/2009 0130   HDL 41 05/17/2009 0130   CHOLHDL 3.4 05/17/2009 0130   VLDL 19 05/17/2009 0130   LDLCALC  05/17/2009 0130    79        Total Cholesterol/HDL:CHD Risk Coronary Heart Disease Risk Table                     Men   Women  1/2 Average Risk   3.4   3.3  Average Risk       5.0   4.4  2 X Average Risk   9.6   7.1  3 X Average Risk  23.4   11.0        Use the calculated Patient Ratio above and the CHD Risk  Table to determine the patient's CHD Risk.        ATP III CLASSIFICATION (LDL):  <  100     mg/dL   Optimal  100-129  mg/dL   Near or Above                    Optimal  130-159  mg/dL   Borderline  160-189  mg/dL   High  >190     mg/dL   Very High      Wt Readings from Last 3 Encounters:  08/16/15 214 lb 12.8 oz (97.433 kg)  01/23/15 220 lb (99.791 kg)  12/01/14 214 lb (97.07 kg)      Other studies Reviewed: Additional studies/ records that were reviewed today include: Prior medical records reviewed, lab work reviewed. Review of the above records demonstrates: as above   ASSESSMENT AND PLAN:  Coronary artery disease -Nuclear stress test March 2016 reassuring, no ischemia, normal EF -Aspirin 81.  History of CABG bypass surgery 2007 -Cardiac catheterization as described above  Hyperlipidemia -Goal-directed therapy, no changes.  Obesity -Encourage weight loss -Gastric bypass (was 500 lbs)  Essential hypertension -Medications reviewed, no significant changes.  Angina -Well-controlled with Metoprolol. -no NTG.   Former smoker -quit 9 years ago   Current medicines are reviewed at length with the patient today.  The patient does not have concerns regarding medicines.  The following changes have been made:  no change  Labs/ tests ordered today include: No orders of the defined types were placed in this encounter.     Disposition:   FU with Layloni Fahrner in 6 months  Signed, Candee Furbish, MD  08/16/2015 1:35 PM    [facility] [address], [place], [place]  [address] Phone: [phone]; Fax: [phone]

## 2015-09-22 ENCOUNTER — Emergency Department (HOSPITAL_COMMUNITY): Payer: Medicare Other

## 2015-09-22 ENCOUNTER — Encounter (HOSPITAL_COMMUNITY): Payer: Self-pay

## 2015-09-22 ENCOUNTER — Emergency Department (HOSPITAL_COMMUNITY)
Admission: EM | Admit: 2015-09-22 | Discharge: 2015-09-22 | Disposition: A | Discharge status: Home / Self Care | Payer: Medicare Other | Attending: Emergency Medicine | Admitting: Emergency Medicine

## 2015-09-22 DIAGNOSIS — Z951 Presence of aortocoronary bypass graft: Secondary | ICD-10-CM | POA: Insufficient documentation

## 2015-09-22 DIAGNOSIS — I10 Essential (primary) hypertension: Secondary | ICD-10-CM | POA: Diagnosis not present

## 2015-09-22 DIAGNOSIS — M5412 Radiculopathy, cervical region: Secondary | ICD-10-CM | POA: Diagnosis not present

## 2015-09-22 DIAGNOSIS — E663 Overweight: Secondary | ICD-10-CM | POA: Insufficient documentation

## 2015-09-22 DIAGNOSIS — J449 Chronic obstructive pulmonary disease, unspecified: Secondary | ICD-10-CM | POA: Insufficient documentation

## 2015-09-22 DIAGNOSIS — Z853 Personal history of malignant neoplasm of breast: Secondary | ICD-10-CM | POA: Insufficient documentation

## 2015-09-22 DIAGNOSIS — I509 Heart failure, unspecified: Secondary | ICD-10-CM | POA: Insufficient documentation

## 2015-09-22 DIAGNOSIS — E119 Type 2 diabetes mellitus without complications: Secondary | ICD-10-CM | POA: Insufficient documentation

## 2015-09-22 DIAGNOSIS — I2510 Atherosclerotic heart disease of native coronary artery without angina pectoris: Secondary | ICD-10-CM | POA: Diagnosis not present

## 2015-09-22 DIAGNOSIS — I252 Old myocardial infarction: Secondary | ICD-10-CM | POA: Diagnosis not present

## 2015-09-22 DIAGNOSIS — G8929 Other chronic pain: Secondary | ICD-10-CM | POA: Insufficient documentation

## 2015-09-22 DIAGNOSIS — M1990 Unspecified osteoarthritis, unspecified site: Secondary | ICD-10-CM | POA: Diagnosis not present

## 2015-09-22 DIAGNOSIS — M159 Polyosteoarthritis, unspecified: Secondary | ICD-10-CM

## 2015-09-22 DIAGNOSIS — Z86010 Personal history of colonic polyps: Secondary | ICD-10-CM | POA: Diagnosis not present

## 2015-09-22 DIAGNOSIS — R5383 Other fatigue: Secondary | ICD-10-CM | POA: Diagnosis present

## 2015-09-22 DIAGNOSIS — E785 Hyperlipidemia, unspecified: Secondary | ICD-10-CM | POA: Insufficient documentation

## 2015-09-22 DIAGNOSIS — Z79899 Other long term (current) drug therapy: Secondary | ICD-10-CM | POA: Diagnosis not present

## 2015-09-22 DIAGNOSIS — Z7982 Long term (current) use of aspirin: Secondary | ICD-10-CM | POA: Insufficient documentation

## 2015-09-22 DIAGNOSIS — Z87891 Personal history of nicotine dependence: Secondary | ICD-10-CM | POA: Diagnosis not present

## 2015-09-22 LAB — I-STAT TROPONIN, ED: TROPONIN I, POC: 0 ng/mL (ref 0.00–0.08)

## 2015-09-22 LAB — BASIC METABOLIC PANEL
Anion gap: 8 (ref 5–15)
BUN: 9 mg/dL (ref 6–20)
CALCIUM: 9 mg/dL (ref 8.9–10.3)
CO2: 26 mmol/L (ref 22–32)
CREATININE: 0.89 mg/dL (ref 0.44–1.00)
Chloride: 104 mmol/L (ref 101–111)
GFR calc Af Amer: >60 mL/min (ref >60)
GLUCOSE: 156 mg/dL — AB (ref 65–99)
Potassium: 4.2 mmol/L (ref 3.5–5.1)
Sodium: 138 mmol/L (ref 135–145)

## 2015-09-22 LAB — CBC
HCT: 42.3 % (ref 36.0–46.0)
HEMOGLOBIN: 13 g/dL (ref 12.0–15.0)
MCH: 27.7 pg (ref 26.0–34.0)
MCHC: 30.7 g/dL (ref 30.0–36.0)
MCV: 90.2 fL (ref 78.0–100.0)
PLATELETS: 183 10*3/uL (ref 150–400)
RBC: 4.69 MIL/uL (ref 3.87–5.11)
RDW: 14.7 % (ref 11.5–15.5)
WBC: 4.8 10*3/uL (ref 4.0–10.5)

## 2015-09-22 MED ORDER — ORPHENADRINE CITRATE ER 100 MG PO TB12: 100.0000 mg | ORAL_TABLET | Freq: Two times a day (BID) | ORAL | Status: DC

## 2015-09-22 MED ORDER — HYDROCODONE-ACETAMINOPHEN 5-325 MG PO TABS
1.0000 | ORAL_TABLET | Freq: Once | ORAL | Status: AC
  Administered 2015-09-22: 1 via ORAL
  Filled 2015-09-22: qty 1

## 2015-09-22 MED ORDER — HYDROCODONE-ACETAMINOPHEN 5-325 MG PO TABS: 1.0000 | ORAL_TABLET | ORAL | Status: DC | PRN

## 2015-09-22 NOTE — Discharge Instructions (Signed)
Cervical Radiculopathy Cervical radiculopathy happens when a nerve in the neck (cervical nerve) is pinched or bruised. This condition can develop because of an injury or as part of the normal aging process. Pressure on the cervical nerves can cause pain or numbness that runs from the neck all the way down into the arm and fingers. Usually, this condition gets better with rest. Treatment may be needed if the condition does not improve.  CAUSES This condition may be caused by:  Injury.  Slipped (herniated) disk.  Muscle tightness in the neck because of overuse.  Arthritis.  Breakdown or degeneration in the bones and joints of the spine (spondylosis) due to aging.  Bone spurs that may develop near the cervical nerves. SYMPTOMS Symptoms of this condition include:  Pain that runs from the neck to the arm and hand. The pain can be severe or irritating. It may be worse when the neck is moved.  Numbness or weakness in the affected arm and hand. DIAGNOSIS This condition may be diagnosed based on symptoms, medical history, and a physical exam. You may also have tests, including:  X-rays.  CT scan.  MRI.  Electromyogram (EMG).  Nerve conduction tests. TREATMENT In many cases, treatment is not needed for this condition. With rest, the condition usually gets better over time. If treatment is needed, options may include:  Wearing a soft neck collar for short periods of time.  Physical therapy to strengthen your neck muscles.  Medicines, such as NSAIDs, oral corticosteroids, or spinal injections.  Surgery. This may be needed if other treatments do not help. Various types of surgery may be done depending on the cause of your problems. HOME CARE INSTRUCTIONS Managing Pain  Take over-the-counter and prescription medicines only as told by your health care provider.  If directed, apply ice to the affected area.  Put ice in a plastic bag.  Place a towel between your skin and the  bag.  Leave the ice on for 20 minutes, 2-3 times per day.  If ice does not help, you can try using heat. Take a warm shower or warm bath, or use a heat pack as told by your health care provider.  Try a gentle neck and shoulder massage to help relieve symptoms. Activity  Rest as needed. Follow instructions from your health care provider about any restrictions on activities.  Do stretching and strengthening exercises as told by your health care provider or physical therapist. General Instructions  If you were given a soft collar, wear it as told by your health care provider.  Use a flat pillow when you sleep.  Keep all follow-up visits as told by your health care provider. This is important. SEEK MEDICAL CARE IF:  Your condition does not improve with treatment. SEEK IMMEDIATE MEDICAL CARE IF:  Your pain gets much worse and cannot be controlled with medicines.  You have weakness or numbness in your hand, arm, face, or leg.  You have a high fever.  You have a stiff, rigid neck.  You lose control of your bowels or your bladder (have incontinence).  You have trouble with walking, balance, or speaking.   This information is not intended to replace advice given to you by your health care provider. Make sure you discuss any questions you have with your health care provider.   Document Released: 05/26/2001 Document Revised: 05/22/2015 Document Reviewed: 10/25/2014 Elsevier Interactive Patient Education 2016 Elsevier Inc. Osteoarthritis Osteoarthritis is a disease that causes soreness and inflammation of a joint. It  occurs when the cartilage at the affected joint wears down. Cartilage acts as a cushion, covering the ends of bones where they meet to form a joint. Osteoarthritis is the most common form of arthritis. It often occurs in older people. The joints affected most often by this condition include those in the:  Ends of the fingers.  Thumbs.  Neck.  Lower  back.  Knees.  Hips. CAUSES  Over time, the cartilage that covers the ends of bones begins to wear away. This causes bone to rub on bone, producing pain and stiffness in the affected joints.  RISK FACTORS Certain factors can increase your chances of having osteoarthritis, including:  Older age.  Excessive body weight.  Overuse of joints.  Previous joint injury. SIGNS AND SYMPTOMS   Pain, swelling, and stiffness in the joint.  Over time, the joint may lose its normal shape.  Small deposits of bone (osteophytes) may grow on the edges of the joint.  Bits of bone or cartilage can break off and float inside the joint space. This may cause more pain and damage. DIAGNOSIS  Your health care provider will do a physical exam and ask about your symptoms. Various tests may be ordered, such as:  X-rays of the affected joint.  Blood tests to rule out other types of arthritis. Additional tests may be used to diagnose your condition. TREATMENT  Goals of treatment are to control pain and improve joint function. Treatment plans may include:  A prescribed exercise program that allows for rest and joint relief.  A weight control plan.  Pain relief techniques, such as:  Properly applied heat and cold.  Electric pulses delivered to nerve endings under the skin (transcutaneous electrical nerve stimulation [TENS]).  Massage.  Certain nutritional supplements.  Medicines to control pain, such as:  Acetaminophen.  Nonsteroidal anti-inflammatory drugs (NSAIDs), such as naproxen.  Narcotic or central-acting agents, such as tramadol.  Corticosteroids. These can be given orally or as an injection.  Surgery to reposition the bones and relieve pain (osteotomy) or to remove loose pieces of bone and cartilage. Joint replacement may be needed in advanced states of osteoarthritis. HOME CARE INSTRUCTIONS   Take medicines only as directed by your health care provider.  Maintain a healthy  weight. Follow your health care provider's instructions for weight control. This may include dietary instructions.  Exercise as directed. Your health care provider can recommend specific types of exercise. These may include:  Strengthening exercises. These are done to strengthen the muscles that support joints affected by arthritis. They can be performed with weights or with exercise bands to add resistance.  Aerobic activities. These are exercises, such as brisk walking or low-impact aerobics, that get your heart pumping.  Range-of-motion activities. These keep your joints limber.  Balance and agility exercises. These help you maintain daily living skills.  Rest your affected joints as directed by your health care provider.  Keep all follow-up visits as directed by your health care provider. SEEK MEDICAL CARE IF:   Your skin turns red.  You develop a rash in addition to your joint pain.  You have worsening joint pain.  You have a fever along with joint or muscle aches. SEEK IMMEDIATE MEDICAL CARE IF:  You have a significant loss of weight or appetite.  You have night sweats. [place] of Arthritis and Musculoskeletal and Skin Diseases: www.niams.SouthExposed.es  [employer] on Aging: http://kim-miller.com/  American College of Rheumatology: www.rheumatology.org   This information is  not intended to replace advice given to you by your health care provider. Make sure you discuss any questions you have with your health care provider.   Document Released: 08/31/2005 Document Revised: 09/21/2014 Document Reviewed: 05/08/2013 Elsevier Interactive Patient Education [employer].

## 2015-09-22 NOTE — ED Provider Notes (Signed)
CSN: [id number]     Arrival date & time 09/22/15  1033 History   First MD Initiated Contact with Patient 09/22/15 1103     Chief Complaint  Patient presents with  . Chest Pain  . Fatigue     (Consider location/radiation/quality/duration/timing/severity/associated sxs/prior Treatment) HPI Patient states that she has had a sharp pain on the right side of her neck that radiates down to her shoulder. She reports it seemed to start while she was doing laundry. She thought it was from a strain. She states she is continuing to get that pain and it is intermittent in nature. She has not had weakness or loss of function of the right upper extremity. She does report some tingling intermittently as well. She feels like she has been more fatigued over the past several days as well. At this time no shortness of breath and no chest pain. The triage note states stabbing pain in left chest however this was not a complaint when I interviewed the patient. Patient also has secondary complaint of pain in the front of her right knee. She reports she's had that for more than a number of months but wanted to get it checked as well. She states that she has had problems with arthritis in her feet and ankles and now she gets pain in the front of the knee. She indicates the patella and the slightly infrapatellar region. No calf pain or edema. Past Medical History  Diagnosis Date  . Hx of CABG     2007  . Hypertension   . Diabetes mellitus   . Ejection fraction     EF 65%, echo, [place], Jan 16, 2011  . Dyslipidemia   . CAD (coronary artery disease)     Nuclear stress test Feb 11, 2011, EF 64%, no scar or ischemia    //         catheterization, November, 2011,patent LIMA-LAD-small caliber distal vessel with diffuse plaque, patent SVG to OM1 and OM 2, patent SVG to PDA and PLA, occluded SVG to diagonal, medical therapy;  Lexiscan Myoview 6/14:  Inferior thinning, no ischemia, EF 61%, low risk  . Overweight(278.02)      stomach stapling 1985 followed by weight loss, then return of weight  . Tobacco abuse     in the past, resolved  . Alcohol abuse     in the past, resolved  . Nausea & vomiting     hospitalization, November, 2011, stricture GE junction, questionable stenosis gastrojejunostomy, disruptive primary peristaltic wave, GE reflux, delayed emptying proximal gastric pouch  . Hx of colonic polyps   . Asthma   . Myocardial infarction ([place])   . Sleep apnea     CPAP being arranged May, 2011//does not use CPAP  . Arthritis   . Blood transfusion without reported diagnosis 1984    after surgery  . Hyperlipidemia   . CHF (congestive heart failure) ([place])   . Chronic back pain   . Sciatica   . Normal cardiac stress test 02/2013  . Breast cancer [facility])     Double mastectomy 2007; s/p tamoxifen and arimidex therapy; no recurrence since 05/2008   Past Surgical History  Procedure Laterality Date  . Coronary artery bypass graft  2009  . Mastectomy Bilateral 1987  . Coronary stent placement    . Abdominal hysterectomy  1989  . Gastric bypass  1983   Family History  Problem Relation Age of Onset  . Hypertension Mother   . Heart disease Mother   .  Diabetes Mother   . Heart disease Father   . Colon cancer Father     does not know age of onset  . Heart disease Sister   . Colon cancer Sister 34  . Stomach cancer Sister   . Asthma Sister   . Esophageal cancer Neg Hx   . Rectal cancer Neg Hx    Social History  Substance Use Topics  . Smoking status: Former Smoker -- 3.00 packs/day for 27 years    Types: Cigarettes    Quit date: 09/14/2000  . Smokeless tobacco: Never Used  . Alcohol Use: No     Comment: History of alcohol abuse but quit 5 years ago   OB History    No data available     Review of Systems 10 Systems reviewed and are negative for acute change except as noted in the HPI.    Allergies  Review of patient's allergies indicates no known allergies.  Home Medications   Prior  to Admission medications   Medication Sig Start Date End Date Taking? Authorizing Provider  albuterol (PROVENTIL HFA;VENTOLIN HFA) 108 (90 BASE) MCG/ACT inhaler Inhale 2 puffs into the lungs every 6 (six) hours as needed for shortness of breath.    Yes Historical Provider, MD  aspirin EC 81 MG tablet Take 81 mg by mouth every other day.    Yes Historical Provider, MD  atorvastatin (LIPITOR) 40 MG tablet Take 1 tablet (40 mg total) by mouth daily. 07/24/15  Yes [name], MD  Cetirizine HCl 10 MG CAPS Take 1 capsule (10 mg total) by mouth daily. 12/01/14  Yes [name], MD  isosorbide mononitrate (IMDUR) 30 MG 24 hr tablet Take 1 tablet (30 mg total) by mouth daily. 11/12/14  Yes [name], PA-C  lisinopril-hydrochlorothiazide (PRINZIDE,ZESTORETIC) 20-12.5 MG per tablet Take 1 tablet by mouth daily.     Yes Historical Provider, MD  metoprolol tartrate (LOPRESSOR) 25 MG tablet Take 1 tablet (25 mg total) by mouth daily. 11/12/14  Yes [name], PA-C  HYDROcodone-acetaminophen (NORCO/VICODIN) 5-325 MG per tablet Take 1 tablet by mouth every 6 (six) hours as needed. Patient taking differently: Take 1 tablet by mouth every 6 (six) hours as needed for moderate pain.  03/31/15   Glendell Docker, NP  HYDROcodone-acetaminophen (NORCO/VICODIN) 5-325 MG tablet Take 1-2 tablets by mouth every 4 (four) hours as needed for moderate pain or severe pain. 09/22/15   [name], MD  nitroGLYCERIN (NITROSTAT) 0.4 MG SL tablet Place 1 tablet (0.4 mg total) under the tongue every 5 (five) minutes as needed for chest pain. 11/12/14   [name], PA-C  orphenadrine (NORFLEX) 100 MG tablet Take 1 tablet (100 mg total) by mouth 2 (two) times daily. 09/22/15   [name], MD   BP 107/65 mmHg  Pulse 60  Temp(Src) 97.9 F (36.6 C) (Oral)  Resp 16  Ht 4\' 11"  (1.499 m)  Wt 213 lb 4.8 oz (96.752 kg)  BMI 43.06 kg/m2  SpO2 99% Physical Exam  Constitutional: She is oriented to person, place, and  time. She appears well-developed and well-nourished.  HENT:  Head: Normocephalic and atraumatic.  Right Ear: External ear normal.  Left Ear: External ear normal.  Nose: Nose normal.  Mouth/Throat: Oropharynx is clear and moist.  Eyes: EOM are normal. Pupils are equal, round, and reactive to light.  Neck: Neck supple.  Patient has reproducible tenderness to palpation in the paraspinous muscle bodies on the right cervical neck at approximately the  levels of C3-4 and 5. This also reproduces significantly tender as I palpate along the body of the trapezius.  Cardiovascular: Normal rate, regular rhythm, normal heart sounds and intact distal pulses.   Pulmonary/Chest: Effort normal and breath sounds normal. She exhibits tenderness.  Patient winces and expresses tenderness to palpation along the right sternocostal margins.  Abdominal: Soft. Bowel sounds are normal. She exhibits no distension. There is no tenderness.  Musculoskeletal: Normal range of motion. She exhibits no edema.  Patient's right knee does not show any acute effusion or erythema. She endorses tenderness to patient with compression over the patella and the patellar tendon. Lateral joint lines are nontender. Popliteal fossa is nontender. No edema of the lower extremity and no calf tenderness.  Neurological: She is alert and oriented to person, place, and time. She has normal strength. Coordination normal. GCS eye subscore is 4. GCS verbal subscore is 5. GCS motor subscore is 6.  Skin: Skin is warm, dry and intact.  Psychiatric: She has a normal mood and affect.    ED Course  Procedures (including critical care time) Labs Review Labs Reviewed  BASIC METABOLIC PANEL - Abnormal; Notable for the following:    Glucose, Bld 156 (*)    All other components within normal limits  CBC  I-STAT TROPOININ, ED    Imaging Review Dg Chest 2 View  09/22/2015  CLINICAL DATA:  65 year old female with left-sided chest pain, shortness of breath  and fatigue for the past 2 days EXAM: CHEST  2 VIEW COMPARISON:  Prior chest x-ray 12/01/2014 FINDINGS: Stable cardiac and mediastinal contours. Patient is status post median sternotomy with evidence of prior multivessel CABG. Elevation of the right hemidiaphragm is chronic and unchanged. The lungs are clear other than chronic atelectasis in the right lung base. Surgical clips present in the right axilla. No acute osseous abnormality. IMPRESSION: Stable chest x-ray without evidence of acute cardiopulmonary process. Electronically Signed   By: Jacqulynn Cadet M.D.   On: 09/22/2015 12:30   Dg Cervical Spine 2-3 Views  09/22/2015  CLINICAL DATA:  Patient with cervical spine pain for 2 days. No known injury. EXAM: CERVICAL SPINE - 2-3 VIEW COMPARISON:  Cervical spine CT 03/02/2015. FINDINGS: Reversal of the normal cervical lordosis. Visualization through C7 on lateral view. Multilevel degenerative disc disease most pronounced C5-6 and C6-7. Prevertebral soft tissues are unremarkable. Craniocervical junction is intact. Lateral masses articulate appropriately with the dens. Lung apices are unremarkable. Calcifications within the soft tissues of the neck bilaterally. IMPRESSION: Multilevel degenerative changes of the cervical spine. Calcifications within the expected location of the carotid bifurcations bilaterally. Electronically Signed   By: Lovey Newcomer M.D.   On: 09/22/2015 12:27   Dg Knee Complete 4 Views Right  09/22/2015  CLINICAL DATA:  Status post fall.  Right knee pain for 1 week. EXAM: RIGHT KNEE - COMPLETE 4+ VIEW COMPARISON:  03/02/2015 FINDINGS: No joint effusion identified. There is no fracture or subluxation identified. Moderate to advanced tricompartment osteoarthritis is identified. IMPRESSION: 1. No acute findings. 2. 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 Electronically Signed   By: Kerby Moors M.D.   On: 09/22/2015 12:27   I have personally reviewed and evaluated these images and  lab results as part of my medical decision-making.   EKG Interpretation   Date/Time:  Sunday September 22 2015 10:38:44 EST Ventricular Rate:  83 PR Interval:  134 QRS Duration: 80 QT Interval:  366 QTC Calculation: 430 R Axis:   32 Text Interpretation:  Normal  sinus rhythm Nonspecific T wave abnormality  Abnormal ECG agree.no STEMI.increase Twave i version V3 c/w old. Confirmed  by Johnney Killian, MD, Jeannie Done [phone]) on 09/22/2015 10:42:59 AM      MDM   Final diagnoses:  Cervical radiculopathy  Osteoarthritis of multiple joints, unspecified osteoarthritis type   Patient presents with cervical pain that is sharp and lancinating in quality. On the right side and has a radiculopathy quality to it. He does have reproducible discomfort to palpation in this region as well as on the chest wall. Although patient has cardiac risk factors, at this time the history of present illness and physical examination are suggestive of musculoskeletal etiology. As an aside, the patient wished to have her right knee checked. This is been a long-standing pain and has no suggestion of DVT. Pain is all localized to the anterior knee and is also very reproducible. X-ray of cervical spine and knee indicate significant degenerative joint disease. She'll be treated for pain with Vicodin and muscle relaxer. No NSAIDs will be used due to the patient's history of GI bleed and cardiac history. He is counseled to follow up closely with her family physician for reassessment. Signs and symptoms for which to return are reviewed.    [name], MD 09/22/15 1330

## 2015-09-22 NOTE — ED Notes (Signed)
Onset 2 days ago intermittent stabbing pain in left chest, right side of neck down into right chest, fatigue and shortness of breath.  Only fatigued at this time, no chest pain or shortness of breath noted at triage. Pt talking in complete sentences.

## 2015-10-01 ENCOUNTER — Encounter (HOSPITAL_COMMUNITY): Payer: Self-pay | Admitting: Emergency Medicine

## 2015-10-01 ENCOUNTER — Emergency Department (HOSPITAL_COMMUNITY)
Admission: EM | Admit: 2015-10-01 | Discharge: 2015-10-01 | Disposition: A | Discharge status: Home / Self Care | Payer: Medicare Other | Attending: Emergency Medicine | Admitting: Emergency Medicine

## 2015-10-01 DIAGNOSIS — I509 Heart failure, unspecified: Secondary | ICD-10-CM | POA: Insufficient documentation

## 2015-10-01 DIAGNOSIS — I10 Essential (primary) hypertension: Secondary | ICD-10-CM | POA: Insufficient documentation

## 2015-10-01 DIAGNOSIS — R300 Dysuria: Secondary | ICD-10-CM | POA: Diagnosis present

## 2015-10-01 DIAGNOSIS — J45909 Unspecified asthma, uncomplicated: Secondary | ICD-10-CM | POA: Insufficient documentation

## 2015-10-01 DIAGNOSIS — Z9861 Coronary angioplasty status: Secondary | ICD-10-CM | POA: Insufficient documentation

## 2015-10-01 DIAGNOSIS — M1990 Unspecified osteoarthritis, unspecified site: Secondary | ICD-10-CM | POA: Diagnosis not present

## 2015-10-01 DIAGNOSIS — Z86010 Personal history of colonic polyps: Secondary | ICD-10-CM | POA: Insufficient documentation

## 2015-10-01 DIAGNOSIS — N3091 Cystitis, unspecified with hematuria: Secondary | ICD-10-CM

## 2015-10-01 DIAGNOSIS — M5430 Sciatica, unspecified side: Secondary | ICD-10-CM | POA: Diagnosis not present

## 2015-10-01 DIAGNOSIS — Z79899 Other long term (current) drug therapy: Secondary | ICD-10-CM | POA: Insufficient documentation

## 2015-10-01 DIAGNOSIS — Z9884 Bariatric surgery status: Secondary | ICD-10-CM | POA: Diagnosis not present

## 2015-10-01 DIAGNOSIS — Z9981 Dependence on supplemental oxygen: Secondary | ICD-10-CM | POA: Insufficient documentation

## 2015-10-01 DIAGNOSIS — G8929 Other chronic pain: Secondary | ICD-10-CM | POA: Insufficient documentation

## 2015-10-01 DIAGNOSIS — Z7982 Long term (current) use of aspirin: Secondary | ICD-10-CM | POA: Insufficient documentation

## 2015-10-01 DIAGNOSIS — I252 Old myocardial infarction: Secondary | ICD-10-CM | POA: Insufficient documentation

## 2015-10-01 DIAGNOSIS — Z951 Presence of aortocoronary bypass graft: Secondary | ICD-10-CM | POA: Diagnosis not present

## 2015-10-01 DIAGNOSIS — I2510 Atherosclerotic heart disease of native coronary artery without angina pectoris: Secondary | ICD-10-CM | POA: Diagnosis not present

## 2015-10-01 DIAGNOSIS — E119 Type 2 diabetes mellitus without complications: Secondary | ICD-10-CM | POA: Diagnosis not present

## 2015-10-01 DIAGNOSIS — N3090 Cystitis, unspecified without hematuria: Secondary | ICD-10-CM | POA: Insufficient documentation

## 2015-10-01 DIAGNOSIS — E785 Hyperlipidemia, unspecified: Secondary | ICD-10-CM | POA: Insufficient documentation

## 2015-10-01 DIAGNOSIS — Z87891 Personal history of nicotine dependence: Secondary | ICD-10-CM | POA: Diagnosis not present

## 2015-10-01 DIAGNOSIS — E663 Overweight: Secondary | ICD-10-CM | POA: Insufficient documentation

## 2015-10-01 DIAGNOSIS — G4730 Sleep apnea, unspecified: Secondary | ICD-10-CM | POA: Diagnosis not present

## 2015-10-01 LAB — URINE MICROSCOPIC-ADD ON

## 2015-10-01 LAB — URINALYSIS, ROUTINE W REFLEX MICROSCOPIC
Bilirubin Urine: NEGATIVE
Glucose, UA: NEGATIVE mg/dL
Ketones, ur: NEGATIVE mg/dL
NITRITE: NEGATIVE
PH: 5.5 (ref 5.0–8.0)
Protein, ur: 100 mg/dL — AB
SPECIFIC GRAVITY, URINE: 1.027 (ref 1.005–1.030)

## 2015-10-01 MED ORDER — CEPHALEXIN 500 MG PO CAPS: 500.0000 mg | ORAL_CAPSULE | Freq: Four times a day (QID) | ORAL | Status: DC

## 2015-10-01 NOTE — ED Notes (Signed)
Pt sts lower abd pain and dysuria with some hematuria noted x 2 days

## 2015-10-01 NOTE — Discharge Instructions (Signed)

## 2015-10-01 NOTE — ED Provider Notes (Signed)
CSN: [id number]     Arrival date & time 10/01/15  0843 History   First MD Initiated Contact with Patient 10/01/15 [phone]     Chief Complaint  Patient presents with  . Abdominal Pain  . Dysuria     (Consider location/radiation/quality/duration/timing/severity/associated sxs/prior Treatment) HPI   Patricia Cuevas is a 65 y.o. female who presents for evaluation of urinary urgency, frequency, dribbling, and hematuria, for 2 days. No fever, chills, nausea, vomiting, weakness or dizziness. No recent UTI. There are no other known modifying factors.   Past Medical History  Diagnosis Date  . Hx of CABG     2007  . Hypertension   . Diabetes mellitus   . Ejection fraction     EF 65%, echo, [place], Jan 16, 2011  . Dyslipidemia   . CAD (coronary artery disease)     Nuclear stress test Feb 11, 2011, EF 64%, no scar or ischemia    //         catheterization, November, 2011,patent LIMA-LAD-small caliber distal vessel with diffuse plaque, patent SVG to OM1 and OM 2, patent SVG to PDA and PLA, occluded SVG to diagonal, medical therapy;  Lexiscan Myoview 6/14:  Inferior thinning, no ischemia, EF 61%, low risk  . Overweight(278.02)     stomach stapling 1985 followed by weight loss, then return of weight  . Tobacco abuse     in the past, resolved  . Alcohol abuse     in the past, resolved  . Nausea & vomiting     hospitalization, November, 2011, stricture GE junction, questionable stenosis gastrojejunostomy, disruptive primary peristaltic wave, GE reflux, delayed emptying proximal gastric pouch  . Hx of colonic polyps   . Asthma   . Myocardial infarction ([place])   . Sleep apnea     CPAP being arranged May, 2011//does not use CPAP  . Arthritis   . Blood transfusion without reported diagnosis 1984    after surgery  . Hyperlipidemia   . CHF (congestive heart failure) ([place])   . Chronic back pain   . Sciatica   . Normal cardiac stress test 02/2013  . Breast cancer [facility])     Double mastectomy  2007; s/p tamoxifen and arimidex therapy; no recurrence since 05/2008   Past Surgical History  Procedure Laterality Date  . Coronary artery bypass graft  2009  . Mastectomy Bilateral 1987  . Coronary stent placement    . Abdominal hysterectomy  1989  . Gastric bypass  1983   Family History  Problem Relation Age of Onset  . Hypertension Mother   . Heart disease Mother   . Diabetes Mother   . Heart disease Father   . Colon cancer Father     does not know age of onset  . Heart disease Sister   . Colon cancer Sister 89  . Stomach cancer Sister   . Asthma Sister   . Esophageal cancer Neg Hx   . Rectal cancer Neg Hx    Social History  Substance Use Topics  . Smoking status: Former Smoker -- 3.00 packs/day for 27 years    Types: Cigarettes    Quit date: 09/14/2000  . Smokeless tobacco: Never Used  . Alcohol Use: No     Comment: History of alcohol abuse but quit 5 years ago   OB History    No data available     Review of Systems  All other systems reviewed and are negative.     Allergies  Review  of patient's allergies indicates no known allergies.  Home Medications   Prior to Admission medications   Medication Sig Start Date End Date Taking? Authorizing Provider  albuterol (PROVENTIL HFA;VENTOLIN HFA) 108 (90 BASE) MCG/ACT inhaler Inhale 2 puffs into the lungs every 6 (six) hours as needed for shortness of breath.    Yes Historical Provider, MD  aspirin EC 81 MG tablet Take 81 mg by mouth every other day.    Yes Historical Provider, MD  atorvastatin (LIPITOR) 40 MG tablet Take 1 tablet (40 mg total) by mouth daily. 07/24/15  Yes [name], MD  Cetirizine HCl 10 MG CAPS Take 1 capsule (10 mg total) by mouth daily. 12/01/14  Yes [name], MD  HYDROcodone-acetaminophen (NORCO/VICODIN) 5-325 MG tablet Take 1-2 tablets by mouth every 4 (four) hours as needed for moderate pain or severe pain. 09/22/15  Yes [name], MD  isosorbide mononitrate (IMDUR) 30 MG 24  hr tablet Take 1 tablet (30 mg total) by mouth daily. 11/12/14  Yes [name], PA-C  lisinopril-hydrochlorothiazide (PRINZIDE,ZESTORETIC) 20-12.5 MG per tablet Take 1 tablet by mouth daily.     Yes Historical Provider, MD  metoprolol tartrate (LOPRESSOR) 25 MG tablet Take 1 tablet (25 mg total) by mouth daily. 11/12/14  Yes [name], PA-C  orphenadrine (NORFLEX) 100 MG tablet Take 1 tablet (100 mg total) by mouth 2 (two) times daily. 09/22/15  Yes [name], MD  cephALEXin (KEFLEX) 500 MG capsule Take 1 capsule (500 mg total) by mouth 4 (four) times daily. 10/01/15   Daleen Bo, MD  HYDROcodone-acetaminophen (NORCO/VICODIN) 5-325 MG per tablet Take 1 tablet by mouth every 6 (six) hours as needed. Patient not taking: Reported on 10/01/2015 03/31/15   Glendell Docker, NP  nitroGLYCERIN (NITROSTAT) 0.4 MG SL tablet Place 1 tablet (0.4 mg total) under the tongue every 5 (five) minutes as needed for chest pain. 11/12/14   [name], PA-C   BP 112/55 mmHg  Pulse 76  Temp(Src) 97.9 F (36.6 C) (Oral)  Resp 18  SpO2 95% Physical Exam  Constitutional: She is oriented to person, place, and time. She appears well-developed and well-nourished. No distress.  Obese  HENT:  Head: Normocephalic and atraumatic.  Right Ear: External ear normal.  Left Ear: External ear normal.  Eyes: Conjunctivae and EOM are normal. Pupils are equal, round, and reactive to light.  Neck: Normal range of motion and phonation normal. Neck supple.  Cardiovascular: Normal rate, regular rhythm and normal heart sounds.   Pulmonary/Chest: Effort normal and breath sounds normal. No respiratory distress. She exhibits no bony tenderness.  Abdominal: Soft. There is no tenderness.  Musculoskeletal: Normal range of motion.  Mild right thoracic and right lumbar tenderness to light palpation.  Neurological: She is alert and oriented to person, place, and time. No cranial nerve deficit or sensory deficit. She exhibits  normal muscle tone. Coordination normal.  Skin: Skin is warm, dry and intact.  Psychiatric: She has a normal mood and affect. Her behavior is normal. Judgment and thought content normal.  Nursing note and vitals reviewed.   ED Course  Procedures (including critical care time)  Medications - No data to display  Patient Vitals for the past 24 hrs:  BP Temp Temp src Pulse Resp SpO2  10/01/15 1015 136/92 mmHg - - - - -  10/01/15 1000 (!) 119/52 mmHg - - 76 - 99 %  10/01/15 0945 134/75 mmHg - - 78 - 99 %  10/01/15 0930 135/62  mmHg - - 81 - 100 %  10/01/15 0859 112/55 mmHg 97.9 F (36.6 C) Oral 76 18 95 %    10:24 AM Reevaluation with update and discussion. After initial assessment and treatment, an updated evaluation reveals no change in clinical status. Findings discussed with patient, all questions answered. Kazmir Oki L    Labs Review Labs Reviewed  URINALYSIS, ROUTINE W REFLEX MICROSCOPIC (NOT AT [facility]) - Abnormal; Notable for the following:    Color, Urine AMBER (*)    APPearance TURBID (*)    Hgb urine dipstick LARGE (*)    Protein, ur 100 (*)    Leukocytes, UA MODERATE (*)    All other components within normal limits  URINE MICROSCOPIC-ADD ON - Abnormal; Notable for the following:    Squamous Epithelial / LPF 0-5 (*)    Bacteria, UA FEW (*)    All other components within normal limits    Imaging Review No results found. I have personally reviewed and evaluated these images and lab results as part of my medical decision-making.   EKG Interpretation None      MDM   Final diagnoses:  Hemorrhagic cystitis    Cystitis, with bleeding, no evidence for systemic infection. Doubt impending vascular collapse.  Nursing Notes Reviewed/ Care Coordinated Applicable Imaging Reviewed Interpretation of Laboratory Data incorporated into ED treatment  The patient appears reasonably screened and/or stabilized for discharge and I doubt any other medical condition or other  [facility] requiring further screening, evaluation, or treatment in the ED at this time prior to discharge.  Plan: Home Medications- Keflex; Home Treatments- rest; return here if the recommended treatment, does not improve the symptoms; Recommended follow up- PCP 1 week     Daleen Bo, MD 10/01/15 1025

## 2015-10-03 LAB — URINE CULTURE
Culture: >=100000
Special Requests: NORMAL

## 2015-10-04 ENCOUNTER — Telehealth (HOSPITAL_COMMUNITY): Payer: Self-pay

## 2015-10-04 NOTE — Progress Notes (Signed)
ED Antimicrobial Stewardship Positive Culture Follow Up   Patricia Cuevas is an 65 y.o. female who presented to [facility] on 10/01/2015 with a chief complaint of  Chief Complaint  Patient presents with  . Abdominal Pain  . Dysuria    Recent Results (from the past 720 hour(s))  Urine culture     Status: None   Collection Time: 10/01/15  9:05 AM  Result Value Ref Range Status   Specimen Description URINE, CLEAN CATCH  Final   Special Requests Normal  Final   Culture   Final    >=100,000 COLONIES/mL ESCHERICHIA COLI Confirmed Extended Spectrum Beta-Lactamase Producer (ESBL)    Report Status 10/03/2015 FINAL  Final   Organism ID, Bacteria ESCHERICHIA COLI  Final      Susceptibility   Escherichia coli - MIC*    AMPICILLIN >=32 RESISTANT Resistant     CEFAZOLIN >=64 RESISTANT Resistant     CEFTRIAXONE >=64 RESISTANT Resistant     CIPROFLOXACIN >=4 RESISTANT Resistant     GENTAMICIN <=1 SENSITIVE Sensitive     IMIPENEM <=0.25 SENSITIVE Sensitive     NITROFURANTOIN <=16 SENSITIVE Sensitive     TRIMETH/SULFA >=320 RESISTANT Resistant     AMPICILLIN/SULBACTAM >=32 RESISTANT Resistant     PIP/TAZO <=4 SENSITIVE Sensitive     * >=100,000 COLONIES/mL ESCHERICHIA COLI    [x]  Treated with cephalexin, organism resistant to prescribed antimicrobial  New antibiotic prescription: Nitrofurantoin 100mg  BID x 5 days  ED Provider: Gloriann Loan, PA-C  Jonna Munro, PharmD Candidate

## 2015-10-04 NOTE — Telephone Encounter (Signed)
Post ED Visit - Positive Culture Follow-up: Chart Hand-off to ED Flow Manager  Culture assessed and recommendations reviewed by: []  Levester Fresh, Pharm.D., BCPS []  [name], Pharm.D., BCPS-AQ ID []  Alycia Rossetti, Pharm.D., BCPS []  [place], Pharm.D., BCPS, AAHIVP []  Legrand Como, Pharm .D., BCPS, AAHIVP [x]  Milus Glazier, Pharm.D. []  Dimitri Ped, Pharm.D.  Positive urine culture  []  Patient discharged without antimicrobial prescription and treatment is now indicated [x]  Organism is resistant to prescribed ED discharge antimicrobial []  Patient with positive blood cultures  Changes discussed with ED provider: Gloriann Loan PA New antibiotic prescription Nitrofurantoin 100mg  po bid x 5 days  This was called to [facility] on [address] per pt request.    Ileene Musa 10/04/2015, 12:14 PM

## 2015-10-05 ENCOUNTER — Emergency Department (HOSPITAL_COMMUNITY)
Admission: EM | Admit: 2015-10-05 | Discharge: 2015-10-05 | Disposition: A | Discharge status: Home / Self Care | Payer: Medicare Other | Attending: Emergency Medicine | Admitting: Emergency Medicine

## 2015-10-05 ENCOUNTER — Encounter (HOSPITAL_COMMUNITY): Payer: Self-pay | Admitting: Emergency Medicine

## 2015-10-05 DIAGNOSIS — E119 Type 2 diabetes mellitus without complications: Secondary | ICD-10-CM | POA: Diagnosis not present

## 2015-10-05 DIAGNOSIS — B9620 Unspecified Escherichia coli [E. coli] as the cause of diseases classified elsewhere: Secondary | ICD-10-CM | POA: Insufficient documentation

## 2015-10-05 DIAGNOSIS — M1990 Unspecified osteoarthritis, unspecified site: Secondary | ICD-10-CM | POA: Diagnosis not present

## 2015-10-05 DIAGNOSIS — Z86010 Personal history of colonic polyps: Secondary | ICD-10-CM | POA: Diagnosis not present

## 2015-10-05 DIAGNOSIS — I252 Old myocardial infarction: Secondary | ICD-10-CM | POA: Diagnosis not present

## 2015-10-05 DIAGNOSIS — Z792 Long term (current) use of antibiotics: Secondary | ICD-10-CM | POA: Diagnosis not present

## 2015-10-05 DIAGNOSIS — R109 Unspecified abdominal pain: Secondary | ICD-10-CM | POA: Diagnosis present

## 2015-10-05 DIAGNOSIS — Z951 Presence of aortocoronary bypass graft: Secondary | ICD-10-CM | POA: Insufficient documentation

## 2015-10-05 DIAGNOSIS — E785 Hyperlipidemia, unspecified: Secondary | ICD-10-CM | POA: Insufficient documentation

## 2015-10-05 DIAGNOSIS — Z7982 Long term (current) use of aspirin: Secondary | ICD-10-CM | POA: Diagnosis not present

## 2015-10-05 DIAGNOSIS — I509 Heart failure, unspecified: Secondary | ICD-10-CM | POA: Diagnosis not present

## 2015-10-05 DIAGNOSIS — I2510 Atherosclerotic heart disease of native coronary artery without angina pectoris: Secondary | ICD-10-CM | POA: Insufficient documentation

## 2015-10-05 DIAGNOSIS — I10 Essential (primary) hypertension: Secondary | ICD-10-CM | POA: Insufficient documentation

## 2015-10-05 DIAGNOSIS — Z79899 Other long term (current) drug therapy: Secondary | ICD-10-CM | POA: Diagnosis not present

## 2015-10-05 DIAGNOSIS — J45909 Unspecified asthma, uncomplicated: Secondary | ICD-10-CM | POA: Diagnosis not present

## 2015-10-05 DIAGNOSIS — Z853 Personal history of malignant neoplasm of breast: Secondary | ICD-10-CM | POA: Insufficient documentation

## 2015-10-05 DIAGNOSIS — G8929 Other chronic pain: Secondary | ICD-10-CM | POA: Insufficient documentation

## 2015-10-05 DIAGNOSIS — Z87891 Personal history of nicotine dependence: Secondary | ICD-10-CM | POA: Diagnosis not present

## 2015-10-05 DIAGNOSIS — N390 Urinary tract infection, site not specified: Secondary | ICD-10-CM | POA: Diagnosis not present

## 2015-10-05 MED ORDER — HYDROCODONE-ACETAMINOPHEN 5-325 MG PO TABS
1.0000 | ORAL_TABLET | Freq: Once | ORAL | Status: AC
  Administered 2015-10-05: 1 via ORAL
  Filled 2015-10-05: qty 1

## 2015-10-05 NOTE — ED Provider Notes (Signed)
CSN: [id number]     Arrival date & time 10/05/15  0946 History   First MD Initiated Contact with Patient 10/05/15 [phone]     Chief Complaint  Patient presents with  . Abdominal Pain   HPI   Ms. Browe is an 65 y.o. female with history of CAD, HTN, DM, CHF, HLD, chronic pain who presents to the ED for evaluation of abdominal pain and back pain. She was seen on 09/30/14 and diagnosed with a UTI, sent home with Keflex. Urine cultures revealed resistance to keflex and pt was called yesterday to change antibiotic to Macrobid. Pt states she is here today because she is confused by what they said on the phone and said she is here because she thought that they meant she had a different problem going on. Pt states she picked up the [place] and started it yesterday. States she continues to have some lower abdominal pain that radiates around to her right flank and lower back. She states this feels similar/continued from her presentation earlier this week. She states she no longer has dysuria or gross hematuria. Denies nausea, vomiting, fever, chills.   Past Medical History  Diagnosis Date  . Hx of CABG     2007  . Hypertension   . Diabetes mellitus   . Ejection fraction     EF 65%, echo, [place], Jan 16, 2011  . Dyslipidemia   . CAD (coronary artery disease)     Nuclear stress test Feb 11, 2011, EF 64%, no scar or ischemia    //         catheterization, November, 2011,patent LIMA-LAD-small caliber distal vessel with diffuse plaque, patent SVG to OM1 and OM 2, patent SVG to PDA and PLA, occluded SVG to diagonal, medical therapy;  Lexiscan Myoview 6/14:  Inferior thinning, no ischemia, EF 61%, low risk  . Overweight(278.02)     stomach stapling 1985 followed by weight loss, then return of weight  . Tobacco abuse     in the past, resolved  . Alcohol abuse     in the past, resolved  . Nausea & vomiting     hospitalization, November, 2011, stricture GE junction, questionable stenosis gastrojejunostomy,  disruptive primary peristaltic wave, GE reflux, delayed emptying proximal gastric pouch  . Hx of colonic polyps   . Asthma   . Myocardial infarction ([place])   . Sleep apnea     CPAP being arranged May, 2011//does not use CPAP  . Arthritis   . Blood transfusion without reported diagnosis 1984    after surgery  . Hyperlipidemia   . CHF (congestive heart failure) ([place])   . Chronic back pain   . Sciatica   . Normal cardiac stress test 02/2013  . Breast cancer [facility])     Double mastectomy 2007; s/p tamoxifen and arimidex therapy; no recurrence since 05/2008   Past Surgical History  Procedure Laterality Date  . Coronary artery bypass graft  2009  . Mastectomy Bilateral 1987  . Coronary stent placement    . Abdominal hysterectomy  1989  . Gastric bypass  1983   Family History  Problem Relation Age of Onset  . Hypertension Mother   . Heart disease Mother   . Diabetes Mother   . Heart disease Father   . Colon cancer Father     does not know age of onset  . Heart disease Sister   . Colon cancer Sister 21  . Stomach cancer Sister   . Asthma Sister   .  Esophageal cancer Neg Hx   . Rectal cancer Neg Hx    Social History  Substance Use Topics  . Smoking status: Former Smoker -- 3.00 packs/day for 27 years    Types: Cigarettes    Quit date: 09/14/2000  . Smokeless tobacco: Never Used  . Alcohol Use: No     Comment: History of alcohol abuse but quit 5 years ago   OB History    No data available     Review of Systems  All other systems reviewed and are negative.     Allergies  Review of patient's allergies indicates no known allergies.  Home Medications   Prior to Admission medications   Medication Sig Start Date End Date Taking? Authorizing Provider  albuterol (PROVENTIL HFA;VENTOLIN HFA) 108 (90 BASE) MCG/ACT inhaler Inhale 2 puffs into the lungs every 6 (six) hours as needed for shortness of breath.     Historical Provider, MD  aspirin EC 81 MG tablet Take 81 mg by  mouth every other day.     Historical Provider, MD  atorvastatin (LIPITOR) 40 MG tablet Take 1 tablet (40 mg total) by mouth daily. 07/24/15   [name], MD  cephALEXin (KEFLEX) 500 MG capsule Take 1 capsule (500 mg total) by mouth 4 (four) times daily. 10/01/15   Daleen Bo, MD  Cetirizine HCl 10 MG CAPS Take 1 capsule (10 mg total) by mouth daily. 12/01/14   [name], MD  HYDROcodone-acetaminophen (NORCO/VICODIN) 5-325 MG per tablet Take 1 tablet by mouth every 6 (six) hours as needed. Patient not taking: Reported on 10/01/2015 03/31/15   Glendell Docker, NP  HYDROcodone-acetaminophen (NORCO/VICODIN) 5-325 MG tablet Take 1-2 tablets by mouth every 4 (four) hours as needed for moderate pain or severe pain. 09/22/15   [name], MD  isosorbide mononitrate (IMDUR) 30 MG 24 hr tablet Take 1 tablet (30 mg total) by mouth daily. 11/12/14   [name], PA-C  lisinopril-hydrochlorothiazide (PRINZIDE,ZESTORETIC) 20-12.5 MG per tablet Take 1 tablet by mouth daily.      Historical Provider, MD  metoprolol tartrate (LOPRESSOR) 25 MG tablet Take 1 tablet (25 mg total) by mouth daily. 11/12/14   [name], PA-C  nitroGLYCERIN (NITROSTAT) 0.4 MG SL tablet Place 1 tablet (0.4 mg total) under the tongue every 5 (five) minutes as needed for chest pain. 11/12/14   [name], PA-C  orphenadrine (NORFLEX) 100 MG tablet Take 1 tablet (100 mg total) by mouth 2 (two) times daily. 09/22/15   [name], MD   BP 118/66 mmHg  Pulse 68  Temp(Src) 98.2 F (36.8 C) (Oral)  Resp 18  SpO2 97% Physical Exam  Constitutional: She is oriented to person, place, and time.  HENT:  Right Ear: External ear normal.  Left Ear: External ear normal.  Nose: Nose normal.  Mouth/Throat: Oropharynx is clear and moist. No oropharyngeal exudate.  Eyes: Conjunctivae and EOM are normal. Pupils are equal, round, and reactive to light.  Neck: Normal range of motion. Neck supple.  Cardiovascular: Normal rate,  regular rhythm, normal heart sounds and intact distal pulses.   Pulmonary/Chest: Effort normal and breath sounds normal. No respiratory distress. She exhibits no tenderness.  Abdominal: Soft. Bowel sounds are normal. She exhibits no distension. There is tenderness in the right lower quadrant and suprapubic area. There is no rigidity, no rebound and no guarding.  +R CVA tenderness  Musculoskeletal: She exhibits no edema.  Diffuse right-sided lumbar paraspinal tenderness.   Neurological: She is alert  and oriented to person, place, and time. No cranial nerve deficit.  Skin: Skin is warm and dry.  Psychiatric: She has a normal mood and affect.  Nursing note and vitals reviewed.  Filed Vitals:   10/05/15 0956 10/05/15 1000  BP: 136/71 118/66  Pulse: 71 68  Temp: 98.2 F (36.8 C)   TempSrc: Oral   Resp: 18   SpO2: 97% 97%     ED Course  Procedures (including critical care time) Labs Review Labs Reviewed - No data to display  Imaging Review No results found. I have personally reviewed and evaluated these images and lab results as part of my medical decision-making.   EKG Interpretation None      MDM   Final diagnoses:  Urinary tract infection, E. coli    Pt is an 65 y.o. female with recently diagnosed UTI, originally treated with keflex but urine culture revealed resistance. Switched to [place] yesterday. She continues to have some lower abdominal and right flank tenderness which she states is the same as from her original presentation. She denies any n/v/d. She is afebrile, not tachycardic, not hypotensive. I suspect pt's symptoms will improve now that she is on macrobid. I have low suspicion for pyelonephritis. No indication for further workup at this time. Pt given one dose of her home vicodin for pain. Offered rx for pyridium but pt declines as she is no longer having dysuria. Instructed to finish macrobid as prescribed and f/u with PCP. ER return precautions  given.    [name], PA-C 10/05/15 [place], MD 10/08/15 [phone]

## 2015-10-05 NOTE — ED Notes (Signed)
Pt sts pain in lower abd pain into back; pt being treated for UTI

## 2015-10-05 NOTE — Discharge Instructions (Signed)
You were seen in the ER today for evaluation of abdominal pain and back pain. Your symptoms are likely due to your urinary tract infection. Now that you are on the new antibiotic, your symptoms should improve within a couple days. You may take the Vicodin you have at home as needed for pain, or over the counter ibuprofen. Please call your primary care provider to schedule a follow up appointment. Return to the ER for new or worsening symptoms.    Urinary Tract Infection Urinary tract infections (UTIs) can develop anywhere along your urinary tract. Your urinary tract is your body's drainage system for removing wastes and extra water. Your urinary tract includes two kidneys, two ureters, a bladder, and a urethra. Your kidneys are a pair of bean-shaped organs. Each kidney is about the size of your fist. They are located below your ribs, one on each side of your spine. CAUSES Infections are caused by microbes, which are microscopic organisms, including fungi, viruses, and bacteria. These organisms are so small that they can only be seen through a microscope. Bacteria are the microbes that most commonly cause UTIs. SYMPTOMS  Symptoms of UTIs may vary by age and gender of the patient and by the location of the infection. Symptoms in young women typically include a frequent and intense urge to urinate and a painful, burning feeling in the bladder or urethra during urination. Older women and men are more likely to be tired, shaky, and weak and have muscle aches and abdominal pain. A fever may mean the infection is in your kidneys. Other symptoms of a kidney infection include pain in your back or sides below the ribs, nausea, and vomiting. DIAGNOSIS To diagnose a UTI, your caregiver will ask you about your symptoms. Your caregiver will also ask you to provide a urine sample. The urine sample will be tested for bacteria and white blood cells. White blood cells are made by your body to help fight  infection. TREATMENT  Typically, UTIs can be treated with medication. Because most UTIs are caused by a bacterial infection, they usually can be treated with the use of antibiotics. The choice of antibiotic and length of treatment depend on your symptoms and the type of bacteria causing your infection. HOME CARE INSTRUCTIONS  If you were prescribed antibiotics, take them exactly as your caregiver instructs you. Finish the medication even if you feel better after you have only taken some of the medication.  Drink enough water and fluids to keep your urine clear or pale yellow.  Avoid caffeine, tea, and carbonated beverages. They tend to irritate your bladder.  Empty your bladder often. Avoid holding urine for long periods of time.  Empty your bladder before and after sexual intercourse.  After a bowel movement, women should cleanse from front to back. Use each tissue only once. SEEK MEDICAL CARE IF:   You have back pain.  You develop a fever.  Your symptoms do not begin to resolve within 3 days. SEEK IMMEDIATE MEDICAL CARE IF:   You have severe back pain or lower abdominal pain.  You develop chills.  You have nausea or vomiting.  You have continued burning or discomfort with urination. MAKE SURE YOU:   Understand these instructions.  Will watch your condition.  Will get help right away if you are not doing well or get worse.   This information is not intended to replace advice given to you by your health care provider. Make sure you discuss any questions you have with  your health care provider.   Document Released: 06/10/2005 Document Revised: 05/22/2015 Document Reviewed: 10/09/2011 Elsevier Interactive Patient Education [employer].

## 2015-10-07 IMAGING — CT CT ABD-PELV W/ CM
2 of 5 series · 15 of 46 positions shown, 17 images · IV contrast (APPLIED)
Comparison: 06/20/2014

CLINICAL DATA: Acute Sharp right lower quadrant abdominal pain for
2 days.

EXAM:
CT ABDOMEN AND PELVIS WITH CONTRAST
TECHNIQUE: Multidetector CT imaging of the abdomen and pelvis was performed
using the standard protocol following bolus administration of
intravenous contrast.
CONTRAST:  100mL OMNIPAQUE IOHEXOL 300 MG/ML  SOLN

[Series 2: abd/ pelvis 5.0 i30f 1 · axial · 0.94mm/px · z∈[-847,-407]mm · 12 of 100 slices shown, 14 images]
[im 6/100  soft-tissue]
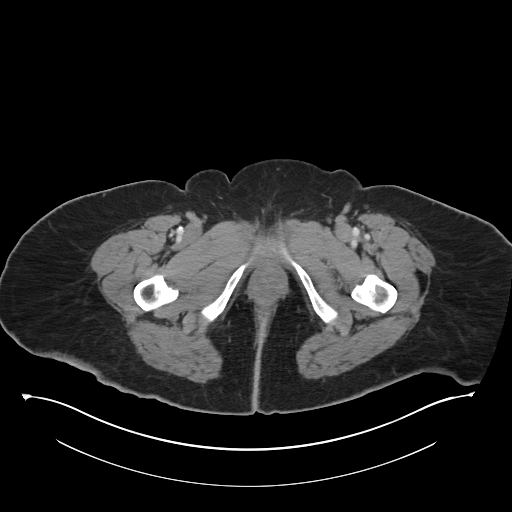
[im 6/100  bone]
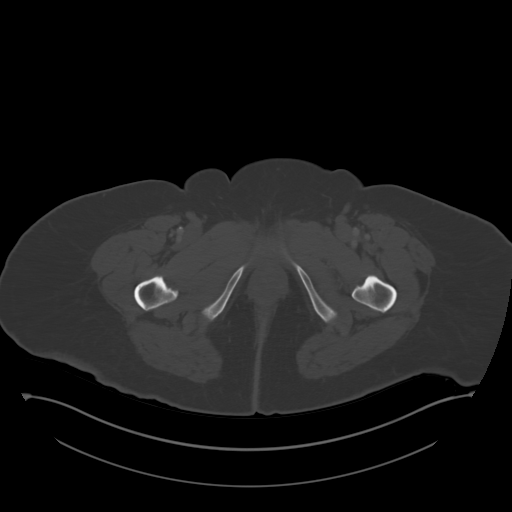
[im 17/100  soft-tissue]
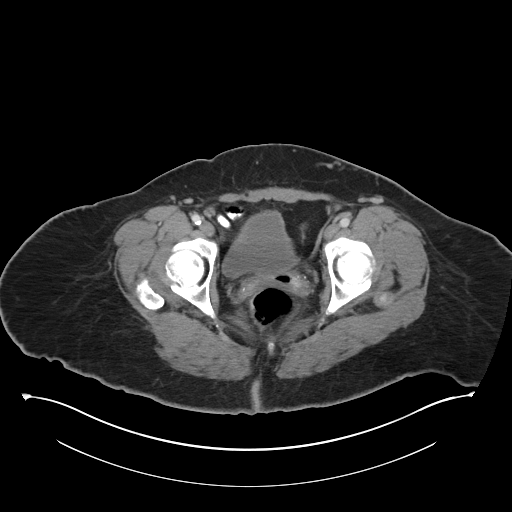
[im 23/100  soft-tissue]
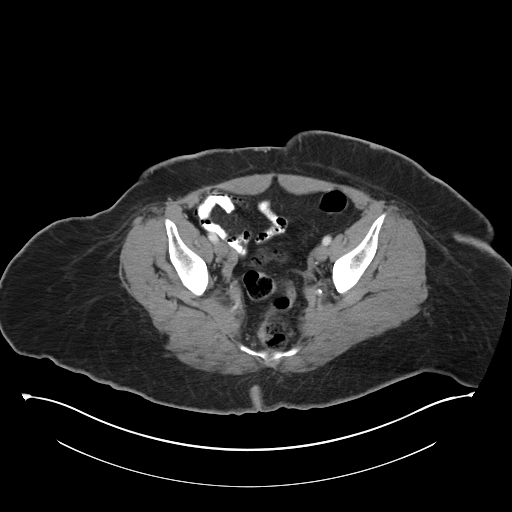
[im 28/100  soft-tissue]
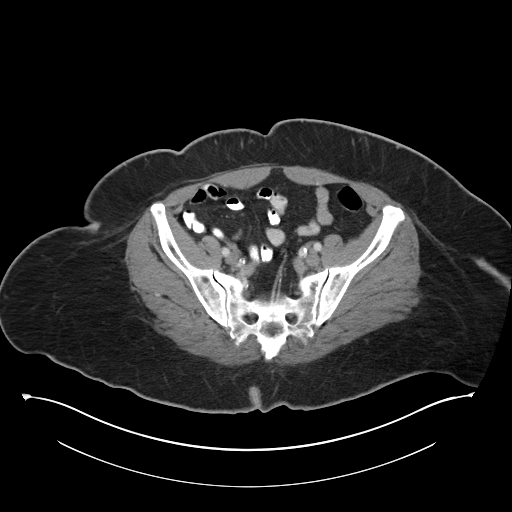
[im 39/100  soft-tissue]
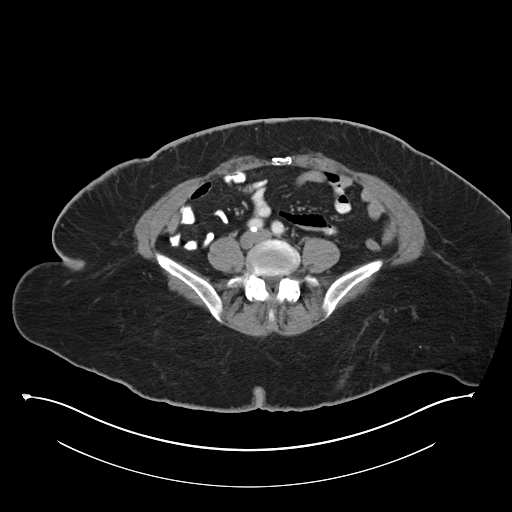
[im 45/100  soft-tissue]
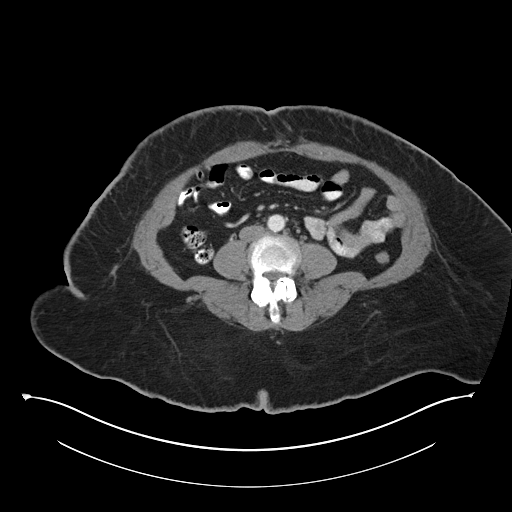
[im 56/100  soft-tissue]
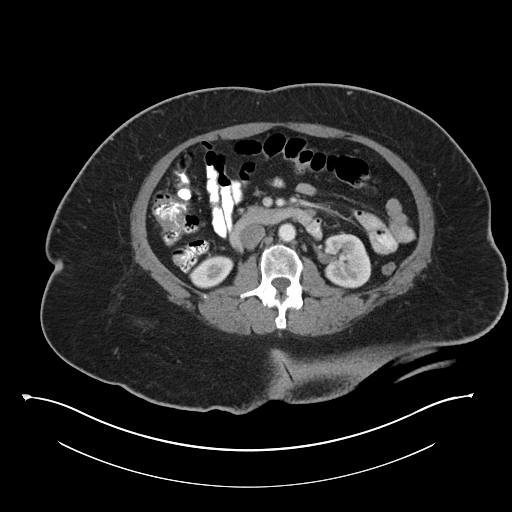
[im 61/100  soft-tissue]
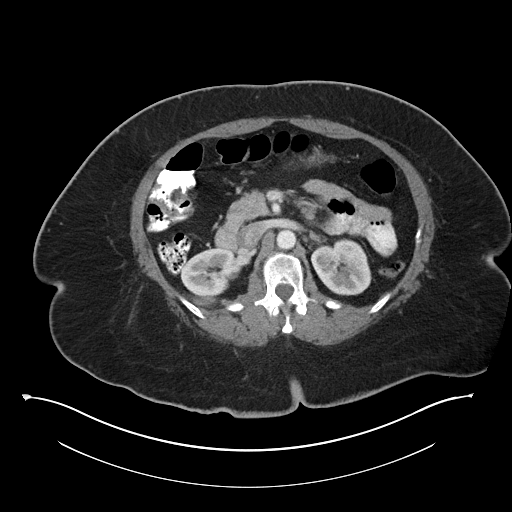
[im 72/100  soft-tissue]
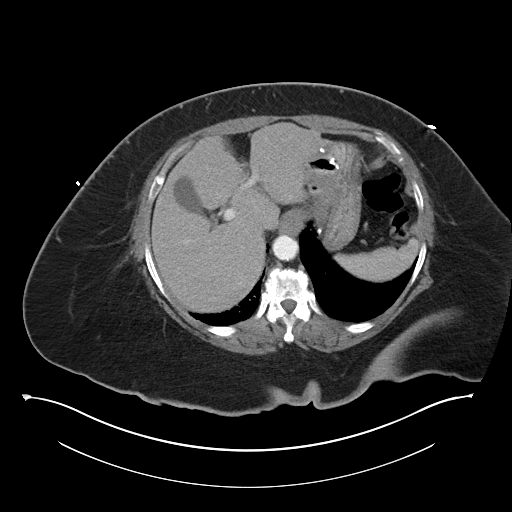
[im 72/100  bone]
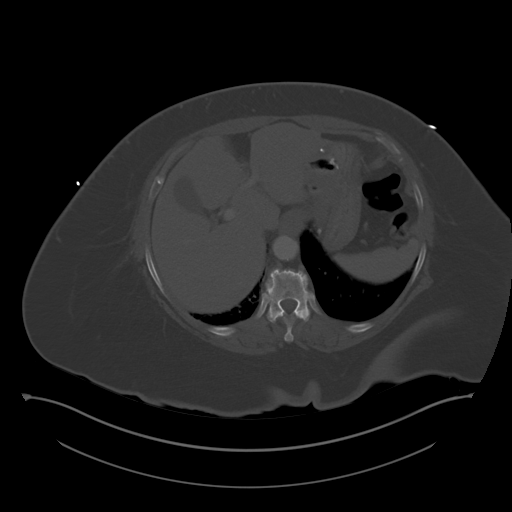
[im 78/100  soft-tissue]
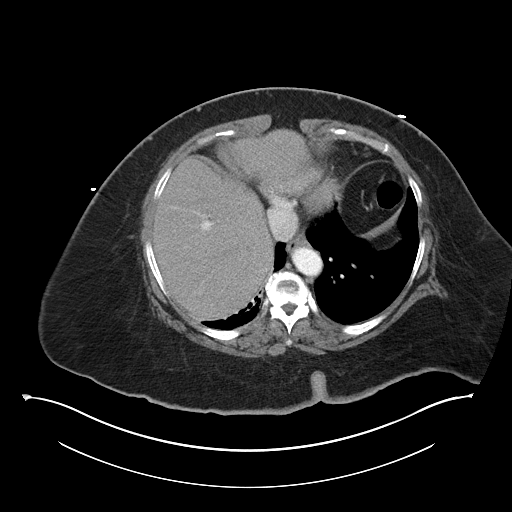
[im 83/100  soft-tissue]
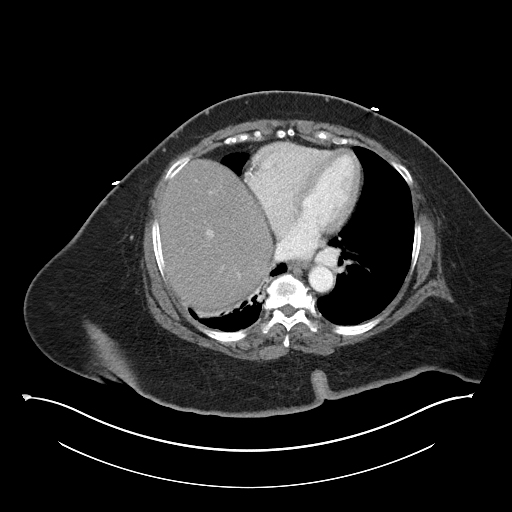
[im 94/100  soft-tissue]
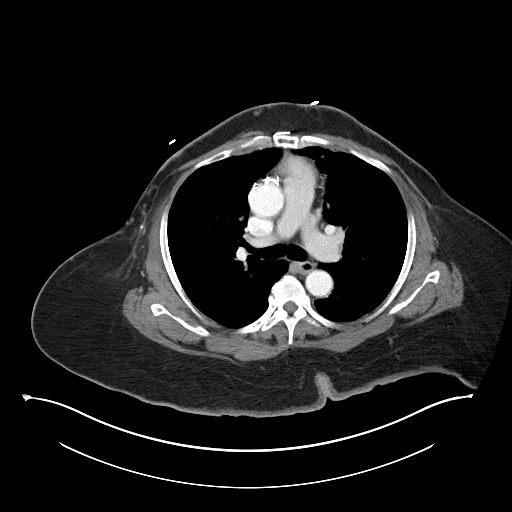

[Series 4: coronal soft tissue · coronal · 0.97mm/px · 3 of 101 slices shown]
[im 34/101  soft-tissue]
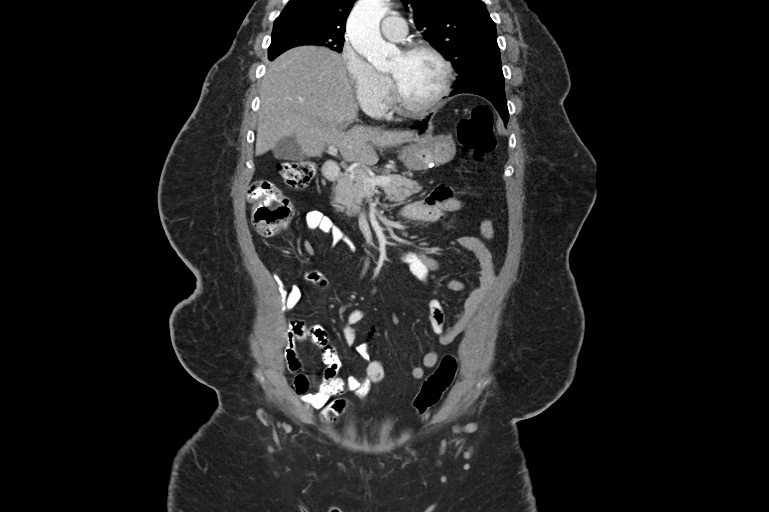
[im 45/101  soft-tissue]
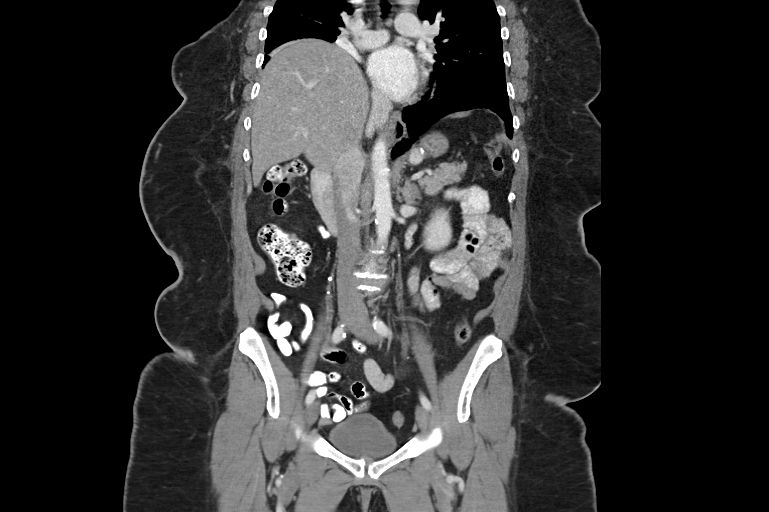
[im 56/101  soft-tissue]
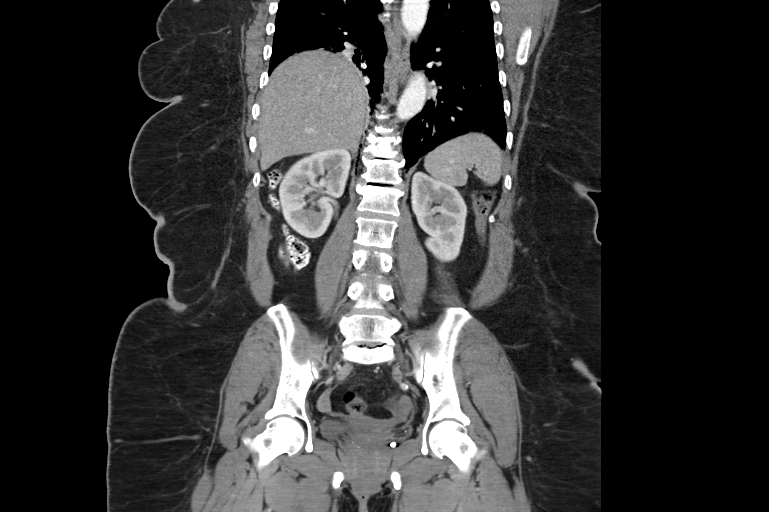

[15 of 46 positions shown; findings below may reference images not displayed]

FINDINGS: Lower chest: minor anterior right middle lobe and lingula scarring
or atelectasis. Chronic right base atelectasis also noted. No lower
lobe pneumonia or acute process. Normal heart size. Prior coronary
bypass noted. No pericardial or pleural effusion. No significant
hiatal hernia. Chronic elevation of the right hemidiaphragm.

Abdomen: Postop changes of the stomach.

Liver, gallbladder, biliary system, pancreas, spleen, adrenal
glands, and kidneys are within normal limits for age and demonstrate
no acute process.

Negative for bowel obstruction, dilatation, ileus, or free air.
Normal appendix demonstrated.

Aortic atherosclerosis noted without aneurysm or occlusive process.

Postop changes of the midline abdominal wall. No evidence of a
ventral hernia or inguinal hernia.

Pelvis: Prior hysterectomy noted. Urinary bladder unremarkable.
Adnexal normal in size. No acute distal bowel process. No pelvic
free fluid, fluid collection, hemorrhage, abscess, or adenopathy.

Diffuse degenerative changes of the thoracolumbar spine. No acute
osseous finding.
IMPRESSION: Stable exam of the abdomen and pelvis. No acute intra-abdominal or
pelvic finding.

## 2015-10-18 ENCOUNTER — Emergency Department (HOSPITAL_COMMUNITY): Payer: Medicare Other

## 2015-10-18 ENCOUNTER — Encounter (HOSPITAL_COMMUNITY): Payer: Self-pay | Admitting: Emergency Medicine

## 2015-10-18 ENCOUNTER — Emergency Department (HOSPITAL_COMMUNITY)
Admission: EM | Admit: 2015-10-18 | Discharge: 2015-10-18 | Disposition: A | Discharge status: Home / Self Care | Payer: Medicare Other | Attending: Emergency Medicine | Admitting: Emergency Medicine

## 2015-10-18 DIAGNOSIS — Z79899 Other long term (current) drug therapy: Secondary | ICD-10-CM | POA: Diagnosis not present

## 2015-10-18 DIAGNOSIS — I10 Essential (primary) hypertension: Secondary | ICD-10-CM | POA: Diagnosis not present

## 2015-10-18 DIAGNOSIS — R1013 Epigastric pain: Secondary | ICD-10-CM | POA: Diagnosis not present

## 2015-10-18 DIAGNOSIS — Z87891 Personal history of nicotine dependence: Secondary | ICD-10-CM | POA: Diagnosis not present

## 2015-10-18 DIAGNOSIS — Z90710 Acquired absence of both cervix and uterus: Secondary | ICD-10-CM | POA: Diagnosis not present

## 2015-10-18 DIAGNOSIS — E119 Type 2 diabetes mellitus without complications: Secondary | ICD-10-CM | POA: Diagnosis not present

## 2015-10-18 DIAGNOSIS — E663 Overweight: Secondary | ICD-10-CM | POA: Diagnosis not present

## 2015-10-18 DIAGNOSIS — Z792 Long term (current) use of antibiotics: Secondary | ICD-10-CM | POA: Insufficient documentation

## 2015-10-18 DIAGNOSIS — M1990 Unspecified osteoarthritis, unspecified site: Secondary | ICD-10-CM | POA: Diagnosis not present

## 2015-10-18 DIAGNOSIS — Z951 Presence of aortocoronary bypass graft: Secondary | ICD-10-CM | POA: Insufficient documentation

## 2015-10-18 DIAGNOSIS — E785 Hyperlipidemia, unspecified: Secondary | ICD-10-CM | POA: Insufficient documentation

## 2015-10-18 DIAGNOSIS — Z86010 Personal history of colonic polyps: Secondary | ICD-10-CM | POA: Diagnosis not present

## 2015-10-18 DIAGNOSIS — J45909 Unspecified asthma, uncomplicated: Secondary | ICD-10-CM | POA: Insufficient documentation

## 2015-10-18 DIAGNOSIS — I252 Old myocardial infarction: Secondary | ICD-10-CM | POA: Insufficient documentation

## 2015-10-18 DIAGNOSIS — Z7982 Long term (current) use of aspirin: Secondary | ICD-10-CM | POA: Diagnosis not present

## 2015-10-18 DIAGNOSIS — R1011 Right upper quadrant pain: Secondary | ICD-10-CM | POA: Insufficient documentation

## 2015-10-18 DIAGNOSIS — G8929 Other chronic pain: Secondary | ICD-10-CM | POA: Insufficient documentation

## 2015-10-18 DIAGNOSIS — I509 Heart failure, unspecified: Secondary | ICD-10-CM | POA: Diagnosis not present

## 2015-10-18 DIAGNOSIS — I2510 Atherosclerotic heart disease of native coronary artery without angina pectoris: Secondary | ICD-10-CM | POA: Insufficient documentation

## 2015-10-18 DIAGNOSIS — Z853 Personal history of malignant neoplasm of breast: Secondary | ICD-10-CM | POA: Insufficient documentation

## 2015-10-18 LAB — COMPREHENSIVE METABOLIC PANEL
ALT: 28 U/L (ref 14–54)
AST: 50 U/L — ABNORMAL HIGH (ref 15–41)
Albumin: 3.8 g/dL (ref 3.5–5.0)
Alkaline Phosphatase: 93 U/L (ref 38–126)
Anion gap: 12 (ref 5–15)
BILIRUBIN TOTAL: 1.2 mg/dL (ref 0.3–1.2)
BUN: 7 mg/dL (ref 6–20)
CHLORIDE: 101 mmol/L (ref 101–111)
CO2: 26 mmol/L (ref 22–32)
CREATININE: 0.86 mg/dL (ref 0.44–1.00)
Calcium: 9.4 mg/dL (ref 8.9–10.3)
Glucose, Bld: 184 mg/dL — ABNORMAL HIGH (ref 65–99)
Potassium: 4.5 mmol/L (ref 3.5–5.1)
Sodium: 139 mmol/L (ref 135–145)
TOTAL PROTEIN: 6.6 g/dL (ref 6.5–8.1)

## 2015-10-18 LAB — URINALYSIS, ROUTINE W REFLEX MICROSCOPIC
Bilirubin Urine: NEGATIVE
GLUCOSE, UA: 100 mg/dL — AB
Hgb urine dipstick: NEGATIVE
KETONES UR: NEGATIVE mg/dL
LEUKOCYTES UA: NEGATIVE
NITRITE: NEGATIVE
PROTEIN: NEGATIVE mg/dL
Specific Gravity, Urine: 1.015 (ref 1.005–1.030)
pH: 7 (ref 5.0–8.0)

## 2015-10-18 LAB — CBC
HCT: 42.8 % (ref 36.0–46.0)
Hemoglobin: 13.8 g/dL (ref 12.0–15.0)
MCH: 28.6 pg (ref 26.0–34.0)
MCHC: 32.2 g/dL (ref 30.0–36.0)
MCV: 88.6 fL (ref 78.0–100.0)
PLATELETS: 175 10*3/uL (ref 150–400)
RBC: 4.83 MIL/uL (ref 3.87–5.11)
RDW: 14.6 % (ref 11.5–15.5)
WBC: 5.8 10*3/uL (ref 4.0–10.5)

## 2015-10-18 LAB — LIPASE, BLOOD: LIPASE: 35 U/L (ref 11–51)

## 2015-10-18 MED ORDER — OXYCODONE HCL 5 MG PO TABS: 5.0000 mg | ORAL_TABLET | ORAL | Status: DC | PRN

## 2015-10-18 MED ORDER — HYDROMORPHONE HCL 1 MG/ML IJ SOLN
1.0000 mg | Freq: Once | INTRAMUSCULAR | Status: AC
  Administered 2015-10-18: 1 mg via INTRAVENOUS
  Filled 2015-10-18: qty 1

## 2015-10-18 MED ORDER — MORPHINE SULFATE (PF) 4 MG/ML IV SOLN: 4.0000 mg | Freq: Once | INTRAVENOUS | Status: DC

## 2015-10-18 MED ORDER — IOHEXOL 300 MG/ML  SOLN
100.0000 mL | Freq: Once | INTRAMUSCULAR | Status: AC | PRN
  Administered 2015-10-18: 100 mL via INTRAVENOUS

## 2015-10-18 MED ORDER — ONDANSETRON HCL 4 MG/2ML IJ SOLN
4.0000 mg | Freq: Once | INTRAMUSCULAR | Status: AC
  Administered 2015-10-18: 4 mg via INTRAVENOUS
  Filled 2015-10-18: qty 2

## 2015-10-18 MED ORDER — SODIUM CHLORIDE 0.9 % IV BOLUS (SEPSIS)
500.0000 mL | Freq: Once | INTRAVENOUS | Status: AC
  Administered 2015-10-18: 500 mL via INTRAVENOUS

## 2015-10-18 MED ORDER — ONDANSETRON 4 MG PO TBDP: ORAL_TABLET | ORAL | Status: DC

## 2015-10-18 MED ORDER — MORPHINE SULFATE (PF) 4 MG/ML IV SOLN
4.0000 mg | Freq: Once | INTRAVENOUS | Status: AC
  Administered 2015-10-18: 4 mg via INTRAVENOUS
  Filled 2015-10-18: qty 1

## 2015-10-18 NOTE — ED Provider Notes (Signed)
11:13 AM Assumed care from Dr. Stark Jock, please see their note for full history, physical and decision making until this point. In brief this is a 65 y.o. year old female who presented to the ED tonight with Abdominal Pain     Intermittent abdominal pain for the last couple weeks worsened today. Has nausea but no vomiting. No other associated symptoms. CT scan can concern for possible cholecystitis so we are awaiting ultrasound.  Ultrasound with a septated gallbladder but no evidence of cholecystitis. Reexamination patient symptoms have improved vastly to her baseline. No nausea no vomiting is tolerating by mouth without difficulty. Possible that her septated gallbladder is related to her symptoms and we will have her follow-up with surgery and 1-2 weeks. Will return here for any new or worsening symptoms.  Discharge instructions, including strict return precautions for new or worsening symptoms, given. Patient and/or family verbalized understanding and agreement with the plan as described.   Labs, studies and imaging reviewed by myself and considered in medical decision making if ordered. Imaging interpreted by radiology.  Labs Reviewed  COMPREHENSIVE METABOLIC PANEL - Abnormal; Notable for the following:    Glucose, Bld 184 (*)    AST 50 (*)    All other components within normal limits  URINALYSIS, ROUTINE W REFLEX MICROSCOPIC (NOT AT [facility]) - Abnormal; Notable for the following:    Glucose, UA 100 (*)    All other components within normal limits  LIPASE, BLOOD  CBC    US Abdomen Limited RUQ  Final Result    CT Abdomen Pelvis W Contrast  Final Result      No Follow-up on file.   Merrily Pew, MD 10/18/15 1114

## 2015-10-18 NOTE — ED Notes (Signed)
MD to see and assess patient before RN assessment. See MD assessment. 

## 2015-10-18 NOTE — ED Provider Notes (Signed)
CSN: [id number]     Arrival date & time 10/18/15  0341 History   First MD Initiated Contact with Patient 10/18/15 0403     Chief Complaint  Patient presents with  . Abdominal Pain     (Consider location/radiation/quality/duration/timing/severity/associated sxs/prior Treatment) HPI Comments: Patient is a 65 year old female with past medical history of breast cancer, hypertension, diabetes, asked her bypass surgery. She presents for evaluation of severe epigastric/right upper quadrant pain that started approximately one hour prior to presentation. This began while she was sleeping. She reports her pain is severe. She denies any vomiting or diarrhea. She denies any constipation. She denies any fevers or chills. She denies any urinary complaints.  Patient is a 65 y.o. female presenting with abdominal pain. The history is provided by the patient.  Abdominal Pain Pain location:  RUQ and epigastric Pain quality: cramping   Pain radiates to:  Does not radiate Pain severity:  Severe Onset quality:  Sudden Duration:  1 hour Timing:  Constant Progression:  Unchanged Chronicity:  New Relieved by:  Nothing Worsened by:  Nothing tried Ineffective treatments:  None tried   Past Medical History  Diagnosis Date  . Hx of CABG     2007  . Hypertension   . Diabetes mellitus   . Ejection fraction     EF 65%, echo, [place], Jan 16, 2011  . Dyslipidemia   . CAD (coronary artery disease)     Nuclear stress test Feb 11, 2011, EF 64%, no scar or ischemia    //         catheterization, November, 2011,patent LIMA-LAD-small caliber distal vessel with diffuse plaque, patent SVG to OM1 and OM 2, patent SVG to PDA and PLA, occluded SVG to diagonal, medical therapy;  Lexiscan Myoview 6/14:  Inferior thinning, no ischemia, EF 61%, low risk  . Overweight(278.02)     stomach stapling 1985 followed by weight loss, then return of weight  . Tobacco abuse     in the past, resolved  . Alcohol abuse     in the  past, resolved  . Nausea & vomiting     hospitalization, November, 2011, stricture GE junction, questionable stenosis gastrojejunostomy, disruptive primary peristaltic wave, GE reflux, delayed emptying proximal gastric pouch  . Hx of colonic polyps   . Asthma   . Myocardial infarction ([place])   . Sleep apnea     CPAP being arranged May, 2011//does not use CPAP  . Arthritis   . Blood transfusion without reported diagnosis 1984    after surgery  . Hyperlipidemia   . CHF (congestive heart failure) ([place])   . Chronic back pain   . Sciatica   . Normal cardiac stress test 02/2013  . Breast cancer [facility])     Double mastectomy 2007; s/p tamoxifen and arimidex therapy; no recurrence since 05/2008   Past Surgical History  Procedure Laterality Date  . Coronary artery bypass graft  2009  . Mastectomy Bilateral 1987  . Coronary stent placement    . Abdominal hysterectomy  1989  . Gastric bypass  1983   Family History  Problem Relation Age of Onset  . Hypertension Mother   . Heart disease Mother   . Diabetes Mother   . Heart disease Father   . Colon cancer Father     does not know age of onset  . Heart disease Sister   . Colon cancer Sister 14  . Stomach cancer Sister   . Asthma Sister   . Esophageal  cancer Neg Hx   . Rectal cancer Neg Hx    Social History  Substance Use Topics  . Smoking status: Former Smoker -- 3.00 packs/day for 27 years    Types: Cigarettes    Quit date: 09/14/2000  . Smokeless tobacco: Never Used  . Alcohol Use: No     Comment: History of alcohol abuse but quit 5 years ago   OB History    No data available     Review of Systems  Gastrointestinal: Positive for abdominal pain.  All other systems reviewed and are negative.     Allergies  Review of patient's allergies indicates no known allergies.  Home Medications   Prior to Admission medications   Medication Sig Start Date End Date Taking? Authorizing Provider  albuterol (PROVENTIL HFA;VENTOLIN  HFA) 108 (90 BASE) MCG/ACT inhaler Inhale 2 puffs into the lungs every 6 (six) hours as needed for shortness of breath.     Historical Provider, MD  aspirin EC 81 MG tablet Take 81 mg by mouth every other day.     Historical Provider, MD  atorvastatin (LIPITOR) 40 MG tablet Take 1 tablet (40 mg total) by mouth daily. 07/24/15   [name], MD  cephALEXin (KEFLEX) 500 MG capsule Take 1 capsule (500 mg total) by mouth 4 (four) times daily. 10/01/15   Daleen Bo, MD  Cetirizine HCl 10 MG CAPS Take 1 capsule (10 mg total) by mouth daily. 12/01/14   [name], MD  HYDROcodone-acetaminophen (NORCO/VICODIN) 5-325 MG per tablet Take 1 tablet by mouth every 6 (six) hours as needed. Patient not taking: Reported on 10/01/2015 03/31/15   Glendell Docker, NP  HYDROcodone-acetaminophen (NORCO/VICODIN) 5-325 MG tablet Take 1-2 tablets by mouth every 4 (four) hours as needed for moderate pain or severe pain. 09/22/15   [name], MD  isosorbide mononitrate (IMDUR) 30 MG 24 hr tablet Take 1 tablet (30 mg total) by mouth daily. 11/12/14   [name], PA-C  lisinopril-hydrochlorothiazide (PRINZIDE,ZESTORETIC) 20-12.5 MG per tablet Take 1 tablet by mouth daily.      Historical Provider, MD  metoprolol tartrate (LOPRESSOR) 25 MG tablet Take 1 tablet (25 mg total) by mouth daily. 11/12/14   [name], PA-C  nitroGLYCERIN (NITROSTAT) 0.4 MG SL tablet Place 1 tablet (0.4 mg total) under the tongue every 5 (five) minutes as needed for chest pain. 11/12/14   [name], PA-C  orphenadrine (NORFLEX) 100 MG tablet Take 1 tablet (100 mg total) by mouth 2 (two) times daily. 09/22/15   [name], MD   BP 128/90 mmHg  Pulse 72  Temp(Src) 97.6 F (36.4 C) (Oral)  Resp 17  SpO2 98% Physical Exam  Constitutional: She is oriented to person, place, and time. She appears well-developed and well-nourished. No distress.  HENT:  Head: Normocephalic and atraumatic.  Neck: Normal range of motion. Neck  supple.  Cardiovascular: Normal rate and regular rhythm.  Exam reveals no gallop and no friction rub.   No murmur heard. Pulmonary/Chest: Effort normal and breath sounds normal. No respiratory distress. She has no wheezes.  Abdominal: Soft. Bowel sounds are normal. She exhibits no distension. There is tenderness. There is no rebound and no guarding.  There is tenderness to palpation in the epigastric region and right upper quadrant.  Musculoskeletal: Normal range of motion.  Neurological: She is alert and oriented to person, place, and time.  Skin: Skin is warm and dry. She is not diaphoretic.  Nursing note and vitals reviewed.  ED Course  Procedures (including critical care time) Labs Review Labs Reviewed  LIPASE, BLOOD  COMPREHENSIVE METABOLIC PANEL  CBC  URINALYSIS, ROUTINE W REFLEX MICROSCOPIC (NOT AT [facility])    Imaging Review No results found. I have personally reviewed and evaluated these images and lab results as part of my medical decision-making.   EKG Interpretation   Date/Time:  Friday October 18 2015 03:52:31 EST Ventricular Rate:  74 PR Interval:  168 QRS Duration: 92 QT Interval:  455 QTC Calculation: 505 R Axis:   23 Text Interpretation:  Sinus rhythm Borderline T abnormalities, anterior  leads Prolonged QT interval Baseline wander in lead(s) I III aVL Confirmed  by Chanele Jerrelle Michelsen  MD, Addalyne Vandehei ([id number]) on 10/18/2015 4:03:44 AM      MDM   Final diagnoses:  None    Patient presents with complaints of severe epigastric pain.  Workup thus far is essentially unremarkable.  She has had a ct scan which reveals the suspicion of a dilated gallbladder.  Radiology is recommending an [place] for further evaluation.  This will be ordered and care will be signed out to Dr. Dayna Barker at shift change.    Veryl Speak, MD 10/19/15 [phone]

## 2015-10-18 NOTE — ED Notes (Addendum)
Per [facility]   Pt presents from home RUQ pain and R flank pain that started at 0130 and awoke her form her sleep. Nausea and weakness since then. Denies V/D, SOB, and abnormal urinary issues. LBM yesterday am. Does have a history of stomach bypass.   Diabetic and cardiac history  Left arm restriciton  133/85 BP 66 HR 98% RA 161CBG

## 2015-11-01 ENCOUNTER — Encounter: Payer: Self-pay | Admitting: Surgery

## 2015-11-01 ENCOUNTER — Other Ambulatory Visit: Payer: Self-pay | Admitting: Surgery

## 2015-11-01 NOTE — H&P (Signed)
Patricia Cuevas 11/01/2015 9:52 AM Location: [facility] Patient #: [id number] DOB: 04-Sep-1951 Separated / Language: Cleophus Molt / Race: Black or African American Female  History of Present Illness Adin Hector MD; 11/01/2015 6:39 PM) Patient words: gallbladder.  The patient is a 65 year old female who presents for evaluation of gall stones. Note for "Gall stones": Pleasant woman. Comes today with her husband. Sent for surgical consultation by Dr [name] for concern of symptomatic gallbladder.  Patient comes in today. She's had episodes severe RIGHT upper quadrant pain. Happen after each she ate some fried fish. She comes today with her husband. It woke her up. The pain is intermittently come back. Usually triggered by eating. She does have a history of coronary disease and had a CABG done a [place]. Breast cancer survivor status post bilateral mastectomies over 30 years ago. She does have colon polyps and gets a colonoscopy. Bowel movement every day. No change in bowel habits. She had an open gastric bypass in the 1980s. Was over 500 pounds. Because of her pain and discomfort she was evaluated by the ED & then her primary care doctor. CT scan concern for cholecystitis. Ultrasound less suspicious of a but did see septations in her gallbladder. Surgical consultation recommended  No personal nor family history of GI/colon cancer, inflammatory bowel disease, irritable bowel syndrome, allergy such as Celiac Sprue, dietary/dairy problems, colitis, ulcers nor gastritis. No recent sick contacts/gastroenteritis. No travel outside the country. No changes in diet. No dysphagia to solids or liquids. No significant heartburn or reflux. No hematochezia, hematemesis, coffee ground emesis. No evidence of prior gastric/peptic ulceration.        CLINICAL DATA: 64 year old female with acute right upper quadrant pain. Initial encounter.  EXAM: US ABDOMEN LIMITED -  RIGHT UPPER QUADRANT  COMPARISON: CT scan of the abdomen and pelvis performed earlier today  FINDINGS: Gallbladder:  No evidence of gallbladder wall thickening or pericholecystic fluid. No cholelithiasis. There are multiple thin internal septations in the region of the gallbladder neck. No vascularity or nodularity within the septations. Per the sonographer, the sonographic Percell Miller sign was negative.  Common bile duct:  Diameter: Within normal limits at 4 mm.  Liver:  No focal lesion identified. Mildly echogenic and coarsened hepatic parenchyma. The adjacent renal parenchyma appears hypoechoic in comparison. Findings are most suggestive of at least moderate steatosis. Main portal vein is patent with normal hepatopetal flow.  IMPRESSION: 1. No sonographic evidence of acute cholecystitis. 2. Multiple thin internal septations within the gallbladder neck without evidence of vascularity or nodularity. 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 Although typically asymptomatic, there have been reported cases with associated symptoms of a biliary colic. 3. Hepatic steatosis.  CLINICAL DATA: Right upper quadrant abdominal pain and back pain with nausea. Prior history breast cancer, gastric bypass and hysterectomy.    EXAM: CT ABDOMEN AND PELVIS WITH CONTRAST  TECHNIQUE: Multidetector CT imaging of the abdomen and pelvis was performed using the standard protocol following bolus administration of intravenous contrast.  CONTRAST: 177mL OMNIPAQUE IOHEXOL 300 MG/ML SOLN  COMPARISON: 08/16/2014  FINDINGS: Lower chest: Chronic elevation of the right hemidiaphragm with right basilar compressive atelectasis and performed. No lower lobe pneumonia or acute process.  Hepatobiliary: No focal hepatic abnormality or biliary dilatation. Gallbladder demonstrates slight increased distension compared to  the prior study with a trace amount of pericholecystic fluid and a small amount of fluid extending along the adjacent duodenum  and anterior to the right kidney. Tiny dependent gallstones also suspected. Difficult to exclude early cholecystitis. Consider further evaluation with right upper quadrant ultrasound.  Pancreas: No mass, inflammatory changes, or other significant abnormality.  Spleen: Within normal limits in size and appearance.  Adrenals/Urinary Tract: No masses identified. No evidence of hydronephrosis.  Stomach/Bowel: Prior gastric bypass surgery noted. Negative for bowel obstruction, dilatation, ileus, or free air. Normal appendix demonstrated. No fluid collection or abscess.  Vascular/Lymphatic: Atherosclerosis of the aorta without aneurysm or occlusive disease. Iliac atherosclerosis noted. No adenopathy.  Reproductive: Prior hysterectomy noted.  Other: No inguinal abnormality or hernia. Postop changes of the abdominal wall.  Musculoskeletal: Diffuse degenerative changes of the spine  IMPRESSION: Trace amount of pericholecystic fluid and free fluid extending along the duodenum with increased gallbladder distention and probable gallstones. Early cholecystitis not excluded. Consider further evaluation with right upper quadrant ultrasound.  Stable postoperative findings.  These results were called by telephone at the time of interpretation on 10/18/2015 at 7:22 am to Dr. Veryl Speak , who verbally acknowledged these results.   Electronically Signed By: Jerilynn Mages. Shick M.D.   Other Problems Marjean Donna, CMA; 11/01/2015 9:52 AM) Arthritis Back Pain Congestive Heart Failure  Past Surgical History Marjean Donna, CMA; 11/01/2015 9:52 AM) Breast Mass; Local Excision Bilateral.  Diagnostic Studies History Marjean Donna, CMA; 11/01/2015 9:52 AM) Pap Smear never  Allergies (Sonya Bynum, CMA; 11/01/2015 9:53 AM) No Known Drug Allergies 11/01/2015  Medication  History (Sonya Bynum, CMA; 11/01/2015 9:53 AM) Hydrocodone-Acetaminophen (10-300MG  Tablet, Oral) Active. Atorvastatin Calcium (40MG  Tablet, Oral) Active. Latanoprost (0.005% Solution, Ophthalmic) Active. GlipiZIDE (10MG  Tablet, Oral) Active. Lisinopril-Hydrochlorothiazide (20-12.5MG  Tablet, Oral) Active. Ondansetron (4MG  Tablet Disperse, Oral) Active. Doxycycline Hyclate (100MG  Tablet, Oral) Active. Lidocaine (5% Ointment, External) Active. Medications Reconciled  Social History Marjean Donna, CMA; 11/01/2015 9:52 AM) Caffeine use Coffee. No drug use  Pregnancy / Birth History Marjean Donna, [place]; 11/01/2015 9:52 AM) Maternal age 25-30     Review of Systems ([place]; 11/01/2015 9:52 AM) General Not Present- Appetite Loss, Chills, Fatigue, Fever, Night Sweats, Weight Gain and Weight Loss. Skin Not Present- Change in Wart/Mole, Dryness, Hives, Jaundice, New Lesions, Non-Healing Wounds, Rash and Ulcer. HEENT Not Present- Earache, Hearing Loss, Hoarseness, Nose Bleed, Oral Ulcers, Ringing in the Ears, Seasonal Allergies, Sinus Pain, Sore Throat, Visual Disturbances, Wears glasses/contact lenses and Yellow Eyes. Respiratory Not Present- Bloody sputum, Chronic Cough, Difficulty Breathing, Snoring and Wheezing. Female Genitourinary Not Present- Frequency, Nocturia, Painful Urination, Pelvic Pain and Urgency. Musculoskeletal Present- Joint Pain. Not Present- Back Pain, Joint Stiffness, Muscle Pain, Muscle Weakness and Swelling of Extremities. Psychiatric Not Present- Anxiety, Bipolar, Change in Sleep Pattern, Depression, Fearful and Frequent crying. Hematology Not Present- Easy Bruising, Excessive bleeding, Gland problems, HIV and Persistent Infections.  Vitals (Sonya Bynum CMA; 11/01/2015 9:53 AM) 11/01/2015 9:52 AM Weight: 213 lb Height: 60in Body Surface Area: 1.92 m Body Mass Index: 41.6 kg/m  Temp.: 78F(Temporal)  Pulse: 76 (Regular)  BP: 130/70 (Sitting,  Left Arm, Standard)      Physical Exam Adin Hector MD; 11/01/2015 10:22 AM)  General Mental Status-Alert. General Appearance-Not in acute distress, Not Sickly. Orientation-Oriented X3. Hydration-Well hydrated. Voice-Normal.  Integumentary Global Assessment Upon inspection and palpation of skin surfaces of the - Axillae: non-tender, no inflammation or ulceration, no drainage. and Distribution of scalp and body hair is normal. General Characteristics Temperature - normal warmth is noted.  Head and Neck Head-normocephalic, atraumatic with no lesions or palpable masses. Face Global Assessment -  atraumatic, no absence of expression. Neck Global Assessment - no abnormal movements, no bruit auscultated on the right, no bruit auscultated on the left, no decreased range of motion, non-tender. Trachea-midline. Thyroid Gland Characteristics - non-tender.  Eye Eyeball - Left-Extraocular movements intact, No Nystagmus. Eyeball - Right-Extraocular movements intact, No Nystagmus. Cornea - Left-No Hazy. Cornea - Right-No Hazy. Sclera/Conjunctiva - Left-No scleral icterus, No Discharge. Sclera/Conjunctiva - Right-No scleral icterus, No Discharge. Pupil - Left-Direct reaction to Cuevas normal. Pupil - Right-Direct reaction to Cuevas normal.  ENMT Ears Pinna - Left - no drainage observed, no generalized tenderness observed. Right - no drainage observed, no generalized tenderness observed. Nose and Sinuses External Inspection of the Nose - no destructive lesion observed. Inspection of the nares - Left - quiet respiration. Right - quiet respiration. Mouth and Throat Lips - Upper Lip - no fissures observed, no pallor noted. Lower Lip - no fissures observed, no pallor noted. Nasopharynx - no discharge present. Oral Cavity/Oropharynx - Tongue - no dryness observed. Oral Mucosa - no cyanosis observed. Hypopharynx - no evidence of airway distress  observed.  Chest and Lung Exam Inspection Movements - Normal and Symmetrical. Accessory muscles - No use of accessory muscles in breathing. Palpation Palpation of the chest reveals - Non-tender. Auscultation Breath sounds - Normal and Clear.  Cardiovascular Auscultation Rhythm - Regular. Murmurs & Other Heart Sounds - Auscultation of the heart reveals - No Murmurs and No Systolic Clicks.  Abdomen Inspection Inspection of the abdomen reveals - No Visible peristalsis and No Abnormal pulsations. Umbilicus - No Bleeding, No Urine drainage. Palpation/Percussion Palpation and Percussion of the abdomen reveal - Soft, Non Tender, No Rebound tenderness, No Rigidity (guarding) and No Cutaneous hyperesthesia. Note: Obese with apple body habitus. Long midline incision intact without hernia. Obvious discomfort and RIGHT upper quadrant but no true Murphy sign. Nontender elsewhere. No diastases. No hernias.  Female Genitourinary Sexual Maturity Tanner 5 - Adult hair pattern. Note: No panniculus. No inguinal hernias. No vaginal bleeding nor discharge  Peripheral Vascular Upper Extremity Inspection - Left - No Cyanotic nailbeds, Not Ischemic. Right - No Cyanotic nailbeds, Not Ischemic.  Neurologic Neurologic evaluation reveals -normal attention span and ability to concentrate, able to name objects and repeat phrases. Appropriate fund of knowledge , normal sensation and normal coordination. Mental Status Affect - not angry, not paranoid. Cranial Nerves-Normal Bilaterally. Gait-Normal.  Neuropsychiatric Mental status exam performed with findings of-able to articulate well with normal speech/language, rate, volume and coherence, thought content normal with ability to perform basic computations and apply abstract reasoning and no evidence of hallucinations, delusions, obsessions or homicidal/suicidal ideation.  Musculoskeletal Global Assessment Spine, Ribs and Pelvis - no instability,  subluxation or laxity. Right Upper Extremity - no instability, subluxation or laxity.  Lymphatic Head & Neck  General Head & Neck Lymphatics: Bilateral - Description - No Localized lymphadenopathy. Axillary  General Axillary Region: Bilateral - Description - No Localized lymphadenopathy. Femoral & Inguinal  Generalized Femoral & Inguinal Lymphatics: Left - Description - No Localized lymphadenopathy. Right - Description - No Localized lymphadenopathy.    Assessment & Plan Adin Hector MD; 11/01/2015 6:39 PM)  SEPTATION OF GALLBLADDER (Q44.1) Impression: Uncertain significance. No evidence of malignancy. Probably contributor to her biliary dyskinesia/colic. Would recommend cholecystectomy. Mainly for the symptoms. She and her husband agree.  CHRONIC CHOLECYSTITIS WITH CALCULUS (K80.10) Impression: Concern for inflammation by CT scan but not necessarily ultrasound. Postprandial abdominal pain especially in the RIGHT upper quadrant suspicious for chronic cholecystitis. Also with  atypical septations without any strong evidence malignancy.  I think she would benefit from cholecystectomy. Given her prior open gastric bypass, not a good candidate for single site technique. We'll try standard four-port. May have to stay overnight.  Would like cardiac clearance to make sure things are okay. Saw Dr. Marlou Porch last month without major issues. Nuclear stress test March 2016 reassuring without ischemia and normal ejection fraction. But her exercise tolerance is fair. Like to make sure they have no concerns for surgery.  Current Plans You are being scheduled for surgery - Our schedulers will call you.  You should hear from our office's scheduling department within 5 working days about the location, date, and time of surgery. We try to make accommodations for patient's preferences in scheduling surgery, but sometimes the OR schedule or the surgeon's schedule prevents [place] from making those  accommodations.  If you have not heard from our office [phone]) in 5 working days, call the office and ask for your surgeon's nurse.  If you have other questions about your diagnosis, plan, or surgery, call the office and ask for your surgeon's nurse.  The anatomy & physiology of hepatobiliary & pancreatic function was discussed. The pathophysiology of gallbladder dysfunction was discussed. Natural history risks without surgery was discussed. I feel the risks of no intervention will lead to serious problems that outweigh the operative risks; therefore, I recommended cholecystectomy to remove the pathology. I explained laparoscopic techniques with possible need for an open approach. Probable cholangiogram to evaluate the bilary tract was explained as well.  Risks such as bleeding, infection, abscess, leak, injury to other organs, need for further treatment, heart attack, death, and other risks were discussed. I noted a good likelihood this will help address the problem. Possibility that this will not correct all abdominal symptoms was explained. Goals of post-operative recovery were discussed as well. We will work to minimize complications. An educational handout further explaining the pathology and treatment options was given as well. Questions were answered. The patient expresses understanding & wishes to proceed with surgery.  Pt Education - Pamphlet Given - Laparoscopic Gallbladder Surgery: discussed with patient and provided information. Written instructions provided Pt Education - [facility] Laparosopic Post Op HCI (Eriel Doyon) Pt Education - [place] (Nikkolas Coomes) Pt Education - Laparoscopic Cholecystectomy: gallbladder Would like cardiac clearance to make sure things are okay. Saw Dr. Marlou Porch last month without major issues. Nuclear stress test March 2016 reassuring without ischemia and normal ejection fraction. But her exercise tolerance is fair. Like to make sure they have no concerns for  surgery.   I recommended obtaining preoperative cardiac clearance. I am concerned about the health of the patient and the ability to tolerate the operation. Therefore, we will request clearance by cardiology to better assess operative risk & see if a reevaluation, further workup, etc is needed. Also recommendations on how medications such as for anticoagulation and blood pressure should be managed/held/restarted after surgery.  Adin Hector, M.D., F.A.C.S. Gastrointestinal and Minimally Invasive Surgery [facility] [address], [place], [place] [address] [phone] Main / Paging

## 2015-11-04 NOTE — Progress Notes (Signed)
Reviewed NUC stress, Low risk. No ischemia. No angina. Well controlled. OK to proceed with surgery, low overall risk.  Candee Furbish, MD

## 2015-12-03 ENCOUNTER — Ambulatory Visit: Payer: Medicare Other | Admitting: Cardiology

## 2015-12-03 ENCOUNTER — Encounter: Payer: Self-pay | Admitting: Nurse Practitioner

## 2015-12-03 ENCOUNTER — Ambulatory Visit (INDEPENDENT_AMBULATORY_CARE_PROVIDER_SITE_OTHER): Payer: Medicare Other | Admitting: Nurse Practitioner

## 2015-12-03 VITALS — BP 116/70 | HR 63 | Ht 59.0 in | Wt 212.0 lb

## 2015-12-03 DIAGNOSIS — R0789 Other chest pain: Secondary | ICD-10-CM | POA: Diagnosis not present

## 2015-12-03 DIAGNOSIS — I10 Essential (primary) hypertension: Secondary | ICD-10-CM

## 2015-12-03 DIAGNOSIS — R002 Palpitations: Secondary | ICD-10-CM

## 2015-12-03 DIAGNOSIS — E785 Hyperlipidemia, unspecified: Secondary | ICD-10-CM

## 2015-12-03 DIAGNOSIS — R079 Chest pain, unspecified: Secondary | ICD-10-CM

## 2015-12-03 MED ORDER — METOPROLOL TARTRATE 25 MG PO TABS: 25.0000 mg | ORAL_TABLET | Freq: Two times a day (BID) | ORAL | Status: DC

## 2015-12-03 NOTE — Patient Instructions (Addendum)
We will be checking the following labs today - NONE   Medication Instructions:    Continue with your current medicines. BUT  I am increasing the Lopressor 25 to twice a day - this has been sent to your pharmacy    Testing/Procedures To Be Arranged:  Lexiscan Myoview  Follow-Up:   See Dr. Marlou Porch in one month with EKG    Other Special Instructions:   We will see how your stress test turns out and then decide about your surgery. I will send Dr. Johney Maine a note today.     If you need a refill on your cardiac medications before your next appointment, please call your pharmacy.   Call the [place] office at [phone] if you have any questions, problems or concerns.

## 2015-12-03 NOTE — Progress Notes (Signed)
CARDIOLOGY OFFICE NOTE  Date:  12/03/2015    Patricia Cuevas Date of Birth: 1951-09-05 Medical Record [id number]  PCP:  [name], MD  Cardiologist:  [facility]    Chief Complaint  Patient presents with  . Pre-op Exam  . Coronary Artery Disease  . Hypertension  . Hyperlipidemia    Work in visit - seen for Dr. Marlou Cuevas    History of Present Illness: Patricia Cuevas is a 65 y.o. female who presents today for a work in visit. Seen for Dr. Marlou Cuevas.  She has a history of CAD with prior coronary disease post bypass surgery in 2007 with catheterization in 2011 demonstrating patent LIMA to LAD, small caliber vessel distally diffuse plaque with patent SVG to OM1 and OM 2, patent SVG to PDA and PLA, occluded SVG to diagonal. Myoview 12/03/14-normal EF, no ischemia. Her other issues include DM, HTN, HLD, breast cancer, past substance abuse and obesity.   Last seen here in December - felt to be doing ok.  Comes in today. Here alone. This was to be a routine visit but reported having chest pain. She was to see Dr. Marlou Cuevas but due to his illness, placed on my schedule. She has lots of issues today. She notes more fatigue and heart racing when she tries to exert herself. This has been going on for about 2 to 3 weeks. She has to stop and rest. She has started using NTG - she says this helps her. She says she does not have actual chest pain but then rubs her chest - says if feels "different". She is only on short acting metoprolol once a day. No real exercise. Limited by arthritis. Planning to have her gallbladder removed in early April with Dr. Johney Maine. She does feel a little lightheaded when she has the heart racing.   Past Medical History  Diagnosis Date  . Hx of CABG     2007  . Hypertension   . Diabetes mellitus   . Ejection fraction     EF 65%, echo, [place], Jan 16, 2011  . Dyslipidemia   . CAD (coronary artery disease)     Nuclear stress test Feb 11, 2011, EF 64%, no scar or ischemia     //         catheterization, November, 2011,patent LIMA-LAD-small caliber distal vessel with diffuse plaque, patent SVG to OM1 and OM 2, patent SVG to PDA and PLA, occluded SVG to diagonal, medical therapy;  Lexiscan Myoview 6/14:  Inferior thinning, no ischemia, EF 61%, low risk  . Overweight(278.02)     stomach stapling 1985 followed by weight loss, then return of weight  . Tobacco abuse     in the past, resolved  . Alcohol abuse     in the past, resolved  . Nausea & vomiting     hospitalization, November, 2011, stricture GE junction, questionable stenosis gastrojejunostomy, disruptive primary peristaltic wave, GE reflux, delayed emptying proximal gastric pouch  . Hx of colonic polyps   . Asthma   . Myocardial infarction ([place])   . Sleep apnea     CPAP being arranged May, 2011//does not use CPAP  . Arthritis   . Blood transfusion without reported diagnosis 1984    after surgery  . Hyperlipidemia   . CHF (congestive heart failure) ([place])   . Chronic back pain   . Sciatica   . Normal cardiac stress test 02/2013  . Breast cancer ([place])     Double mastectomy  2007; s/p tamoxifen and arimidex therapy; no recurrence since 05/2008    Past Surgical History  Procedure Laterality Date  . Coronary artery bypass graft  2009  . Mastectomy Bilateral 1987  . Coronary stent placement    . Abdominal hysterectomy  1989  . Gastric bypass  1983    open.  [place]     Medications: Current Outpatient Prescriptions  Medication Sig Dispense Refill  . albuterol (PROVENTIL HFA;VENTOLIN HFA) 108 (90 BASE) MCG/ACT inhaler Inhale 2 puffs into the lungs every 6 (six) hours as needed for shortness of breath.     [name] Kitchen aspirin EC 81 MG tablet Take 81 mg by mouth every other day.     [name] Kitchen atorvastatin (LIPITOR) 40 MG tablet Take 1 tablet (40 mg total) by mouth daily. 30 tablet 0  . Cetirizine HCl 10 MG CAPS Take 1 capsule (10 mg total) by mouth daily. 30 capsule 0  . HYDROcodone-acetaminophen (NORCO/VICODIN)  5-325 MG tablet Take 1-2 tablets by mouth every 4 (four) hours as needed for moderate pain or severe pain. 20 tablet 0  . isosorbide mononitrate (IMDUR) 30 MG 24 hr tablet Take 1 tablet (30 mg total) by mouth daily. 30 tablet 6  . lisinopril-hydrochlorothiazide (PRINZIDE,ZESTORETIC) 20-12.5 MG per tablet Take 1 tablet by mouth daily.      . metoprolol tartrate (LOPRESSOR) 25 MG tablet Take 1 tablet (25 mg total) by mouth 2 (two) times daily. 60 tablet 6  . nitroGLYCERIN (NITROSTAT) 0.4 MG SL tablet Place 1 tablet (0.4 mg total) under the tongue every 5 (five) minutes as needed for chest pain. 25 tablet 3  . ondansetron (ZOFRAN ODT) 4 MG disintegrating tablet 4mg  ODT q4 hours prn nausea/vomit 10 tablet 0  . orphenadrine (NORFLEX) 100 MG tablet Take 1 tablet (100 mg total) by mouth 2 (two) times daily. 30 tablet 0  . oxyCODONE (ROXICODONE) 5 MG immediate release tablet Take 1 tablet (5 mg total) by mouth every 4 (four) hours as needed for severe pain. 30 tablet 0   No current facility-administered medications for this visit.    Allergies: No Known Allergies  Social History: The patient  reports that she quit smoking about 15 years ago. Her smoking use included Cigarettes. She has a 81 pack-year smoking history. She has never used smokeless tobacco. She reports that she does not drink alcohol or use illicit drugs.   Family History: The patient's family history includes Asthma in her sister; Colon cancer in her father; Colon cancer (age of onset: 48) in her sister; Diabetes in her mother; Heart disease in her father, mother, and sister; Hypertension in her mother; Stomach cancer in her sister. There is no history of Esophageal cancer or Rectal cancer.   Review of Systems: Please see the history of present illness.   Otherwise, the review of systems is positive for none.   All other systems are reviewed and negative.   Physical Exam: VS:  BP 116/70 mmHg  Pulse 63  Ht 4\' 11"  (1.499 m)  Wt 212  lb (96.163 kg)  BMI 42.80 kg/m2 .  BMI Body mass index is 42.8 kg/(m^2).  Wt Readings from Last 3 Encounters:  12/03/15 212 lb (96.163 kg)  09/22/15 213 lb 4.8 oz (96.752 kg)  08/16/15 214 lb 12.8 oz (97.433 kg)    General: Pleasant. Black female who looks older than her stated age. She is alert and in no acute distress.  HEENT: Normal. Neck: Supple, no JVD, carotid bruits, or masses noted.  Cardiac: Regular rate and rhythm. No murmurs, rubs, or gallops. No edema.  Respiratory:  Lungs are clear to auscultation bilaterally with normal work of breathing.  GI: Soft and nontender.  MS: No deformity or atrophy. Gait and ROM intact but she moves slowly and is limping.  Skin: Warm and dry. Color is normal.  Neuro:  Strength and sensation are intact and no gross focal deficits noted.  Psych: Alert, appropriate and with normal affect.   LABORATORY DATA:  EKG:  EKG is ordered today. This demonstrates NSR with nonspecific T wave changes.  Lab Results  Component Value Date   WBC 5.8 10/18/2015   HGB 13.8 10/18/2015   HCT 42.8 10/18/2015   PLT 175 10/18/2015   GLUCOSE 184* 10/18/2015   CHOL  05/17/2009    139        ATP III CLASSIFICATION:  <200     mg/dL   Desirable  200-239  mg/dL   Borderline High  >=240    mg/dL   High          TRIG 93 05/17/2009   HDL 41 05/17/2009   LDLCALC  05/17/2009    79        Total Cholesterol/HDL:CHD Risk Coronary Heart Disease Risk Table                     Men   Women  1/2 Average Risk   3.4   3.3  Average Risk       5.0   4.4  2 X Average Risk   9.6   7.1  3 X Average Risk  23.4   11.0        Use the calculated Patient Ratio above and the CHD Risk Table to determine the patient's CHD Risk.        ATP III CLASSIFICATION (LDL):  <100     mg/dL   Optimal  100-129  mg/dL   Near or Above                    Optimal  130-159  mg/dL   Borderline  160-189  mg/dL   High  >190     mg/dL   Very High   ALT 28 10/18/2015   AST 50* 10/18/2015   NA  139 10/18/2015   K 4.5 10/18/2015   CL 101 10/18/2015   CREATININE 0.86 10/18/2015   BUN 7 10/18/2015   CO2 26 10/18/2015   INR 1.18 03/04/2013   HGBA1C 8.8* 07/28/2012    BNP (last 3 results) No results for input(s): BNP in the last 8760 hours.  ProBNP (last 3 results) No results for input(s): PROBNP in the last 8760 hours.   Other Studies Reviewed Today:  Myoview Impression from 11/2014 Exercise Capacity: Lexiscan with no exercise. BP Response: Normal blood pressure response. Clinical Symptoms: Stomach pain ECG Impression: No significant ST segment change suggestive of ischemia. Comparison with Prior Nuclear Study: No images to compare  Overall Impression: Normal stress nuclear study.  LV Ejection Fraction: 64%. LV Wall Motion: NL LV Function; NL Wall Motion  [place] [place]    Assessment/Plan: 1. Chest pain/palpitations/fatigue - using NTG with relief. Will get her Myoview updated. Lopressor increased to BID.   2. Known CAD with prior CABG - one graft known to be occluded. Updating Myoview.   3. HTN - BP good on her current regimen.   4. HLD - on statin therapy - labs checked by PCP  5.  DM  6. Pre op clearance - will need Myoview in light of current symptoms. Further disposition to follow.   Current medicines are reviewed with the patient today.  The patient does not have concerns regarding medicines other than what has been noted above.  The following changes have been made:  See above.  Labs/ tests ordered today include:    Orders Placed This Encounter  Procedures  . Myocardial Perfusion Imaging  . EKG 12-Lead     Disposition:   FU in one month with EKG with Dr. Marlou Cuevas unless Myoview is abnormal - then would see sooner.    Patient is agreeable to this plan and will call if any problems develop in the interim.   Signed: [name], RN, ANP-C 12/03/2015 12:17 PM  [facility] [address] [place], [place]  [address] Phone: [phone] Fax: [phone]

## 2015-12-10 ENCOUNTER — Telehealth (HOSPITAL_COMMUNITY): Payer: Self-pay | Admitting: *Deleted

## 2015-12-10 NOTE — Telephone Encounter (Signed)
Patient given detailed instructions per Myocardial Perfusion Study Information Sheet for the test on 12/13/15 at 0815. Patient notified to arrive 15 minutes early and that it is imperative to arrive on time for appointment to keep from having the test rescheduled.  If you need to cancel or reschedule your appointment, please call the office within 24 hours of your appointment. Failure to do so may result in a cancellation of your appointment, and a $50 no show fee. Patient verbalized understanding.Ghislaine Harcum, Ranae Palms

## 2015-12-13 ENCOUNTER — Ambulatory Visit (HOSPITAL_BASED_OUTPATIENT_CLINIC_OR_DEPARTMENT_OTHER): Payer: Medicare Other

## 2015-12-13 ENCOUNTER — Encounter (HOSPITAL_COMMUNITY): Payer: Self-pay | Admitting: Emergency Medicine

## 2015-12-13 ENCOUNTER — Emergency Department (HOSPITAL_COMMUNITY)
Admission: EM | Admit: 2015-12-13 | Discharge: 2015-12-14 | Disposition: A | Discharge status: Home / Self Care | Payer: Medicare Other | Attending: Emergency Medicine | Admitting: Emergency Medicine

## 2015-12-13 ENCOUNTER — Emergency Department (HOSPITAL_COMMUNITY): Payer: Medicare Other

## 2015-12-13 ENCOUNTER — Telehealth: Payer: Self-pay | Admitting: Nurse Practitioner

## 2015-12-13 DIAGNOSIS — R9439 Abnormal result of other cardiovascular function study: Secondary | ICD-10-CM | POA: Insufficient documentation

## 2015-12-13 DIAGNOSIS — Z853 Personal history of malignant neoplasm of breast: Secondary | ICD-10-CM | POA: Diagnosis not present

## 2015-12-13 DIAGNOSIS — E663 Overweight: Secondary | ICD-10-CM | POA: Diagnosis not present

## 2015-12-13 DIAGNOSIS — Z7982 Long term (current) use of aspirin: Secondary | ICD-10-CM | POA: Diagnosis not present

## 2015-12-13 DIAGNOSIS — Z79899 Other long term (current) drug therapy: Secondary | ICD-10-CM | POA: Insufficient documentation

## 2015-12-13 DIAGNOSIS — G4730 Sleep apnea, unspecified: Secondary | ICD-10-CM | POA: Insufficient documentation

## 2015-12-13 DIAGNOSIS — I10 Essential (primary) hypertension: Secondary | ICD-10-CM | POA: Insufficient documentation

## 2015-12-13 DIAGNOSIS — I2510 Atherosclerotic heart disease of native coronary artery without angina pectoris: Secondary | ICD-10-CM | POA: Diagnosis not present

## 2015-12-13 DIAGNOSIS — I252 Old myocardial infarction: Secondary | ICD-10-CM | POA: Insufficient documentation

## 2015-12-13 DIAGNOSIS — J45909 Unspecified asthma, uncomplicated: Secondary | ICD-10-CM | POA: Diagnosis not present

## 2015-12-13 DIAGNOSIS — R531 Weakness: Secondary | ICD-10-CM

## 2015-12-13 DIAGNOSIS — E119 Type 2 diabetes mellitus without complications: Secondary | ICD-10-CM | POA: Insufficient documentation

## 2015-12-13 DIAGNOSIS — R079 Chest pain, unspecified: Secondary | ICD-10-CM

## 2015-12-13 DIAGNOSIS — I509 Heart failure, unspecified: Secondary | ICD-10-CM | POA: Diagnosis not present

## 2015-12-13 DIAGNOSIS — E785 Hyperlipidemia, unspecified: Secondary | ICD-10-CM

## 2015-12-13 DIAGNOSIS — Z87891 Personal history of nicotine dependence: Secondary | ICD-10-CM | POA: Insufficient documentation

## 2015-12-13 DIAGNOSIS — Z9981 Dependence on supplemental oxygen: Secondary | ICD-10-CM | POA: Diagnosis not present

## 2015-12-13 DIAGNOSIS — R0789 Other chest pain: Secondary | ICD-10-CM

## 2015-12-13 LAB — COMPREHENSIVE METABOLIC PANEL
ALBUMIN: 3.5 g/dL (ref 3.5–5.0)
ALK PHOS: 75 U/L (ref 38–126)
ALT: 16 U/L (ref 14–54)
ANION GAP: 8 (ref 5–15)
AST: 25 U/L (ref 15–41)
BUN: 11 mg/dL (ref 6–20)
CALCIUM: 9.3 mg/dL (ref 8.9–10.3)
CHLORIDE: 108 mmol/L (ref 101–111)
CO2: 24 mmol/L (ref 22–32)
Creatinine, Ser: 0.83 mg/dL (ref 0.44–1.00)
GFR calc Af Amer: >60 mL/min (ref >60)
GFR calc non Af Amer: >60 mL/min (ref >60)
GLUCOSE: 168 mg/dL — AB (ref 65–99)
POTASSIUM: 4.2 mmol/L (ref 3.5–5.1)
SODIUM: 140 mmol/L (ref 135–145)
TOTAL PROTEIN: 6.2 g/dL — AB (ref 6.5–8.1)
Total Bilirubin: 0.7 mg/dL (ref 0.3–1.2)

## 2015-12-13 LAB — CBC WITH DIFFERENTIAL/PLATELET
BASOS PCT: 0 %
Basophils Absolute: 0 10*3/uL (ref 0.0–0.1)
EOS ABS: 0.1 10*3/uL (ref 0.0–0.7)
EOS PCT: 1 %
HCT: 38.7 % (ref 36.0–46.0)
HEMOGLOBIN: 12.4 g/dL (ref 12.0–15.0)
LYMPHS ABS: 3.3 10*3/uL (ref 0.7–4.0)
Lymphocytes Relative: 62 %
MCH: 28.1 pg (ref 26.0–34.0)
MCHC: 32 g/dL (ref 30.0–36.0)
MCV: 87.6 fL (ref 78.0–100.0)
MONOS PCT: 5 %
Monocytes Absolute: 0.3 10*3/uL (ref 0.1–1.0)
NEUTROS PCT: 32 %
Neutro Abs: 1.7 10*3/uL (ref 1.7–7.7)
PLATELETS: 195 10*3/uL (ref 150–400)
RBC: 4.42 MIL/uL (ref 3.87–5.11)
RDW: 15 % (ref 11.5–15.5)
WBC: 5.4 10*3/uL (ref 4.0–10.5)

## 2015-12-13 LAB — MYOCARDIAL PERFUSION IMAGING
LV dias vol: 78 mL (ref 46–106)
LV sys vol: 29 mL
Peak HR: 96 {beats}/min
RATE: 0.36
Rest HR: 59 {beats}/min
SDS: 3
SRS: 5
SSS: 8
TID: 1.03

## 2015-12-13 MED ORDER — TECHNETIUM TC 99M SESTAMIBI GENERIC - CARDIOLITE
10.3000 | Freq: Once | INTRAVENOUS | Status: AC | PRN
  Administered 2015-12-13: 10 via INTRAVENOUS

## 2015-12-13 MED ORDER — ASPIRIN 81 MG PO CHEW
324.0000 mg | CHEWABLE_TABLET | Freq: Once | ORAL | Status: AC
  Administered 2015-12-14: 324 mg via ORAL
  Filled 2015-12-13: qty 4

## 2015-12-13 MED ORDER — TECHNETIUM TC 99M SESTAMIBI GENERIC - CARDIOLITE
33.0000 | Freq: Once | INTRAVENOUS | Status: AC | PRN
  Administered 2015-12-13: 33 via INTRAVENOUS

## 2015-12-13 MED ORDER — REGADENOSON 0.4 MG/5ML IV SOLN
0.4000 mg | Freq: Once | INTRAVENOUS | Status: AC
  Administered 2015-12-13: 0.4 mg via INTRAVENOUS

## 2015-12-13 MED ORDER — SODIUM CHLORIDE 0.9 % IV BOLUS (SEPSIS)
500.0000 mL | Freq: Once | INTRAVENOUS | Status: AC
  Administered 2015-12-13: 500 mL via INTRAVENOUS

## 2015-12-13 NOTE — Telephone Encounter (Signed)
New Message:  Pt called in stating that she was just speaking with a nurse about her stress test and she wanted to speak back her

## 2015-12-13 NOTE — Telephone Encounter (Signed)
Called pt several times on cell phone would ring than go busy.  Called pt house phone and left message.

## 2015-12-13 NOTE — ED Provider Notes (Signed)
CSN: [id number]     Arrival date & time 12/13/15  1959 History   First MD Initiated Contact with Patient 12/13/15 2255     Chief Complaint  Patient presents with  . Weakness  . Tingling     (Consider location/radiation/quality/duration/timing/severity/associated sxs/prior Treatment) HPI   Patricia Cuevas is a 65 y/o AAF with hx of CABG 2007, HTN, DM, CAD, obesity, She presents to the emergency department today with complaints of generalized weakness, fatigue, exertional chest pain and exertional dyspnea, with associated bilateral upper and lower extremity numbness and tingling. She has had gradual process depression of the symptoms over the past 1-2 weeks, had a stress test this morning which was positive for ischemia, she states that she had a headache during the stress test and numbness and tingling as she went home. Her daughter saw her at approximately 29 AM and stated that she was slower with no energy, appeared tired and restless, "not acting right."  She was not confused or altered, she did not have any focal weakness, no slurred speech.  The patient's symptoms were unchanged throughout most the day. She was in quite of town but she received a call from cardiology instructing her to stay at home and to rest. She is scheduled for cardiac catheter and 4 days.  Patient states that 1 or 2 weeks ago she was feeling fine and then she developed the exertional chest pain, exertional shortness of breath, weakness and fatigue and has been unable to do her normal activities. She is concerned about being at home with these continued symptoms, so that is why she presented to the ER tonight with the weakness that has since resolved.  Past Medical History  Diagnosis Date  . Hx of CABG     2007  . Hypertension   . Diabetes mellitus   . Ejection fraction     EF 65%, echo, [place], Jan 16, 2011  . Dyslipidemia   . CAD (coronary artery disease)     Nuclear stress test Feb 11, 2011, EF 64%, no scar or  ischemia    //         catheterization, November, 2011,patent LIMA-LAD-small caliber distal vessel with diffuse plaque, patent SVG to OM1 and OM 2, patent SVG to PDA and PLA, occluded SVG to diagonal, medical therapy;  Lexiscan Myoview 6/14:  Inferior thinning, no ischemia, EF 61%, low risk  . Overweight(278.02)     stomach stapling 1985 followed by weight loss, then return of weight  . Tobacco abuse     in the past, resolved  . Alcohol abuse     in the past, resolved  . Nausea & vomiting     hospitalization, November, 2011, stricture GE junction, questionable stenosis gastrojejunostomy, disruptive primary peristaltic wave, GE reflux, delayed emptying proximal gastric pouch  . Hx of colonic polyps   . Asthma   . Myocardial infarction ([place])   . Sleep apnea     CPAP being arranged May, 2011//does not use CPAP  . Arthritis   . Blood transfusion without reported diagnosis 1984    after surgery  . Hyperlipidemia   . CHF (congestive heart failure) ([place])   . Chronic back pain   . Sciatica   . Normal cardiac stress test 02/2013  . Breast cancer [facility])     Double mastectomy 2007; s/p tamoxifen and arimidex therapy; no recurrence since 05/2008   Past Surgical History  Procedure Laterality Date  . Coronary artery bypass graft  2009  .  Mastectomy Bilateral 1987  . Coronary stent placement    . Abdominal hysterectomy  1989  . Gastric bypass  1983    open.  [place]   Family History  Problem Relation Age of Onset  . Hypertension Mother   . Heart disease Mother   . Diabetes Mother   . Heart disease Father   . Colon cancer Father     does not know age of onset  . Heart disease Sister   . Colon cancer Sister 69  . Stomach cancer Sister   . Asthma Sister   . Esophageal cancer Neg Hx   . Rectal cancer Neg Hx    Social History  Substance Use Topics  . Smoking status: Former Smoker -- 3.00 packs/day for 27 years    Types: Cigarettes    Quit date: 09/14/2000  . Smokeless tobacco:  Never Used  . Alcohol Use: No     Comment: History of alcohol abuse but quit 5 years ago   OB History    No data available     Review of Systems  All other systems reviewed and are negative.     Allergies  Review of patient's allergies indicates no known allergies.  Home Medications   Prior to Admission medications   Medication Sig Start Date End Date Taking? Authorizing Provider  albuterol (PROVENTIL HFA;VENTOLIN HFA) 108 (90 BASE) MCG/ACT inhaler Inhale 2 puffs into the lungs every 6 (six) hours as needed for shortness of breath.    Yes Historical Provider, MD  aspirin EC 81 MG tablet Take 81 mg by mouth every other day.    Yes Historical Provider, MD  atorvastatin (LIPITOR) 40 MG tablet Take 1 tablet (40 mg total) by mouth daily. 07/24/15  Yes [name], MD  Cetirizine HCl 10 MG CAPS Take 1 capsule (10 mg total) by mouth daily. 12/01/14  Yes [name], MD  Hydrocodone-Acetaminophen (VICODIN HP) 10-300 MG TABS Take 1 tablet by mouth 2 (two) times daily as needed (pain).   Yes Historical Provider, MD  isosorbide mononitrate (IMDUR) 30 MG 24 hr tablet Take 1 tablet (30 mg total) by mouth daily. 11/12/14  Yes [name], PA-C  latanoprost (XALATAN) 0.005 % ophthalmic solution Place 1 drop into both eyes at bedtime.   Yes Historical Provider, MD  lisinopril-hydrochlorothiazide (PRINZIDE,ZESTORETIC) 20-12.5 MG per tablet Take 1 tablet by mouth daily.     Yes Historical Provider, MD  metoprolol tartrate (LOPRESSOR) 25 MG tablet Take 1 tablet (25 mg total) by mouth 2 (two) times daily. 12/03/15  Yes [name], NP  nitroGLYCERIN (NITROSTAT) 0.4 MG SL tablet Place 1 tablet (0.4 mg total) under the tongue every 5 (five) minutes as needed for chest pain. 11/12/14  Yes [name], PA-C  ondansetron (ZOFRAN ODT) 4 MG disintegrating tablet 4mg  ODT q4 hours prn nausea/vomit 10/18/15  Yes Merrily Pew, MD  orphenadrine (NORFLEX) 100 MG tablet Take 1 tablet (100 mg total) by  mouth 2 (two) times daily. 09/22/15  Yes [name], MD   BP 118/74 mmHg  Pulse 60  Temp(Src) 98.4 F (36.9 C) (Oral)  Resp 17  Ht 4\' 11"  (1.499 m)  Wt 97.523 kg  BMI 43.40 kg/m2  SpO2 97% Physical Exam  Constitutional: She is oriented to person, place, and time. She appears well-developed and well-nourished. No distress.  Non-toxic appearing female, but appears tired  HENT:  Head: Normocephalic and atraumatic.  Nose: Nose normal.  Mouth/Throat: Oropharynx is clear and moist.  No oropharyngeal exudate.  Eyes: Conjunctivae and EOM are normal. Pupils are equal, round, and reactive to light. Right eye exhibits no discharge. Left eye exhibits no discharge. No scleral icterus.  Neck: Normal range of motion. No JVD present. No tracheal deviation present. No thyromegaly present.  Cardiovascular: Normal rate, regular rhythm, normal heart sounds and intact distal pulses.  Exam reveals no gallop and no friction rub.   No murmur heard. Pulmonary/Chest: Effort normal and breath sounds normal. No respiratory distress. She has no wheezes. She has no rales. She exhibits no tenderness.  Abdominal: Soft. Bowel sounds are normal. She exhibits no distension and no mass. There is no tenderness. There is no rebound and no guarding.  Musculoskeletal: Normal range of motion. She exhibits no edema or tenderness.  Lymphadenopathy:    She has no cervical adenopathy.  Neurological: She is alert and oriented to person, place, and time. She has normal reflexes. No cranial nerve deficit. She exhibits normal muscle tone. Coordination normal.  Normal sensation to light touch in all extremities Moves all extremities without ataxia Cranial nerves II through XII grossly intact  Skin: Skin is warm and dry. No rash noted. She is not diaphoretic. No erythema. No pallor.  Psychiatric: She has a normal mood and affect. Her behavior is normal. Judgment and thought content normal.  Nursing note and vitals reviewed.   ED  Course  Procedures (including critical care time) Labs Review Labs Reviewed  COMPREHENSIVE METABOLIC PANEL - Abnormal; Notable for the following:    Glucose, Bld 168 (*)    Total Protein 6.2 (*)    All other components within normal limits  CBC WITH DIFFERENTIAL/PLATELET  BRAIN NATRIURETIC PEPTIDE  I-STAT TROPOININ, ED      Imaging Review No results found. I have personally reviewed and evaluated these images and lab results as part of my medical decision-making.   EKG Interpretation   Date/Time:  Friday December 13 2015 20:22:35 EDT Ventricular Rate:  70 PR Interval:  150 QRS Duration: 82 QT Interval:  378 QTC Calculation: 408 R Axis:   13 Text Interpretation:  Normal sinus rhythm Nonspecific T wave abnormality  No significant change since last tracing Confirmed by Maryan Rued  MD,  Loree Fee ([id number]) on 12/13/2015 10:53:20 PM      MDM   Pt with significant cardiac hx, CABG x 6, CAD, HTN, HLD, DM, recent 1-2 week hx of weakness, fatigue, exertional dyspnea and exertional CP limiting her normal activity.  She is a pt of Dr. Marlou Porch, saw Cecille Rubin NP this week and had stress test this am which was positive for ischemia-  Overall Study Impression Myocardial perfusion is abnormal. Findings consistent with ischemia. This is an intermediate risk study. Overall left ventricular systolic function was normal. LV cavity size is normal. Nuclear stress EF: 63%. The left ventricular ejection fraction is normal (55-65%). There are no significant changes in comparison to the prior study.  Patient reports bilateral tingling and pain in her arms and legs that occurred during the stress test, with weakness, numbness, and worsening fatigue.  The patient and her daughter were concerned for progressive symptoms and came to the ER for evaluation. She is scheduled for a cardiac cath in 4 days.  They state they feel uncomfortable at home with her worsening sx.    Basic labs, troponin, BNP and EKG were obtained  in the ER.  Troponin and BNP negative. No EKG changes from prior.  Will consult cardiology and asked further recommendations.    Dr.  Azeem will admit, requests neuro consult. Pt inpt tele as she will need to be inpt until cardiac cath  Dr. Leonel Ramsay to consult for inpt neuro.   3:18 AM Dr. Susy Manor saw and evaluated the pt - please see his documentation, he feels she is safe to discharge home.  I discharged the pt after speaking with him.  Gabe, the pt's primary RN reviewed return precautions, pt understands to return to the ER if having worsening SOB, or CP that is not relieved with rest or NG.  Pt's vitals stable at time of discharge.  Pt and daughter now comfortable with discharge home. Filed Vitals:   12/14/15 0040 12/14/15 0130 12/14/15 0230 12/14/15 0300  BP: 147/75 118/74 146/80 129/81  Pulse: 80 60 70 70  Temp:      TempSrc:      Resp: 21 17 24 15   Height:      Weight:      SpO2: 99% 97% 98% 98%      Final diagnoses:  Weakness       Delsa Grana, PA-C 12/20/15 0359  [name], MD 12/21/15 1609

## 2015-12-13 NOTE — ED Notes (Signed)
Pt. reports generalized weakness/fatigue and tingling at both legs and neck onset today , denies fever or chills, pt. stated stress tests done today scheduled for appointment on Tuesday .

## 2015-12-13 NOTE — Telephone Encounter (Signed)
Rings once and than busy signal on cell phone

## 2015-12-13 NOTE — ED Notes (Signed)
Pt sts she had a stress test earlier today and "they found some blockages" and she is supposed to "have the surgery on the 6th"

## 2015-12-13 NOTE — ED Notes (Signed)
MD at bedside. 

## 2015-12-14 DIAGNOSIS — R9439 Abnormal result of other cardiovascular function study: Secondary | ICD-10-CM

## 2015-12-14 DIAGNOSIS — R531 Weakness: Secondary | ICD-10-CM | POA: Diagnosis not present

## 2015-12-14 LAB — I-STAT TROPONIN, ED: TROPONIN I, POC: 0 ng/mL (ref 0.00–0.08)

## 2015-12-14 LAB — BRAIN NATRIURETIC PEPTIDE: B Natriuretic Peptide: 16.2 pg/mL (ref 0.0–100.0)

## 2015-12-14 NOTE — Consult Note (Signed)
Referring Physician: ER -- Delsa Grana, PA  Primary Cardiologist: Dr. Candee Furbish  Reason for Consultation: "not feeling right" after stress test   HPI: 16 pleasant AA woman with history of CAD. CABG in 2007 (LIMA-LAD, SVG-Diag, SVG-OM1-OM2, SVG-PDA-PLA), cath in 2011 showed occluded SVG-Diag, atypical CP with nuclear stress test in 11/2014 negative for inducible ischemia and normal LVEF,  Was recently seen Cardiology NP [name], NP) on 12/03/2015 (detailed note reviewed). Given symptoms of CP, Lexiscan was ordered which was performed 12/13/2015 (report reviewed). Patient reports having headache, warmth sensation, "hot flash" with sweating and tingling all over during the stress test. She continued to have warm sensation and tingling for many hours after getting home. The test is reported abnormal therefore a cath is being planned in next few days. However, her daughters were concerned of her symptoms therefore brought her to the ER.   Upon my evaluation, patient reports being back to her normal self. Denies any CP, resting SOB, fever, chills, N/V, palpitations, dizziness, blurring of vision, diaphoresis, orthopnea. Her daughter (lives with her) and her niece at the bedside.   ECG negative for acute ischemic changes. Negative Trop. Normal BNP, Cr, hepatic function test and CBC.     Review of Systems:  10 systems reviewed unremarkable except as noted in my HPI   Past Medical History  Diagnosis Date  . Hx of CABG     2007  . Hypertension   . Diabetes mellitus   . Ejection fraction     EF 65%, echo, [place], Jan 16, 2011  . Dyslipidemia   . CAD (coronary artery disease)     Nuclear stress test Feb 11, 2011, EF 64%, no scar or ischemia    //         catheterization, November, 2011,patent LIMA-LAD-small caliber distal vessel with diffuse plaque, patent SVG to OM1 and OM 2, patent SVG to PDA and PLA, occluded SVG to diagonal, medical therapy;  Lexiscan Myoview 6/14:  Inferior  thinning, no ischemia, EF 61%, low risk  . Overweight(278.02)     stomach stapling 1985 followed by weight loss, then return of weight  . Tobacco abuse     in the past, resolved  . Alcohol abuse     in the past, resolved  . Nausea & vomiting     hospitalization, November, 2011, stricture GE junction, questionable stenosis gastrojejunostomy, disruptive primary peristaltic wave, GE reflux, delayed emptying proximal gastric pouch  . Hx of colonic polyps   . Asthma   . Myocardial infarction ([place])   . Sleep apnea     CPAP being arranged May, 2011//does not use CPAP  . Arthritis   . Blood transfusion without reported diagnosis 1984    after surgery  . Hyperlipidemia   . CHF (congestive heart failure) ([place])   . Chronic back pain   . Sciatica   . Normal cardiac stress test 02/2013  . Breast cancer [facility])     Double mastectomy 2007; s/p tamoxifen and arimidex therapy; no recurrence since 05/2008     (Not in a hospital admission)   No current facility-administered medications on file prior to encounter.   Current Outpatient Prescriptions on File Prior to Encounter  Medication Sig Dispense Refill  . albuterol (PROVENTIL HFA;VENTOLIN HFA) 108 (90 BASE) MCG/ACT inhaler Inhale 2 puffs into the lungs every 6 (six) hours as needed for shortness of breath.     [name] Kitchen aspirin EC 81 MG tablet Take 81 mg by  mouth every other day.     [name] Kitchen atorvastatin (LIPITOR) 40 MG tablet Take 1 tablet (40 mg total) by mouth daily. 30 tablet 0  . Cetirizine HCl 10 MG CAPS Take 1 capsule (10 mg total) by mouth daily. 30 capsule 0  . isosorbide mononitrate (IMDUR) 30 MG 24 hr tablet Take 1 tablet (30 mg total) by mouth daily. 30 tablet 6  . lisinopril-hydrochlorothiazide (PRINZIDE,ZESTORETIC) 20-12.5 MG per tablet Take 1 tablet by mouth daily.      . metoprolol tartrate (LOPRESSOR) 25 MG tablet Take 1 tablet (25 mg total) by mouth 2 (two) times daily. 60 tablet 6  . nitroGLYCERIN (NITROSTAT) 0.4 MG SL tablet Place 1  tablet (0.4 mg total) under the tongue every 5 (five) minutes as needed for chest pain. 25 tablet 3  . ondansetron (ZOFRAN ODT) 4 MG disintegrating tablet 4mg  ODT q4 hours prn nausea/vomit 10 tablet 0  . orphenadrine (NORFLEX) 100 MG tablet Take 1 tablet (100 mg total) by mouth 2 (two) times daily. 30 tablet 0     Infusions:   No Known Allergies  Social History   Social History  . Marital Status: Single    Spouse Name: N/A  . Number of Children: N/A  . Years of Education: N/A   Occupational History  . Disabled    Social History Main Topics  . Smoking status: Former Smoker -- 3.00 packs/day for 27 years    Types: Cigarettes    Quit date: 09/14/2000  . Smokeless tobacco: Never Used  . Alcohol Use: No     Comment: History of alcohol abuse but quit 5 years ago  . Drug Use: No     Comment: Used to use Marijuan but stopped 5 years ago.   [name] Kitchen Sexual Activity: Not on file   Other Topics Concern  . Not on file   Social History Narrative    Family History  Problem Relation Age of Onset  . Hypertension Mother   . Heart disease Mother   . Diabetes Mother   . Heart disease Father   . Colon cancer Father     does not know age of onset  . Heart disease Sister   . Colon cancer Sister 14  . Stomach cancer Sister   . Asthma Sister   . Esophageal cancer Neg Hx   . Rectal cancer Neg Hx     PHYSICAL EXAM: Filed Vitals:   12/14/15 0040 12/14/15 0130  BP: 147/75 118/74  Pulse: 80 60  Temp:    Resp: 21 17    No intake or output data in the 24 hours ending 12/14/15 0251  General:  Well appearing. No respiratory difficulty HEENT: normal Neck: supple. no JVD. Carotids 2+ bilat; no bruits. No lymphadenopathy or thryomegaly appreciated. Cor: PMI nondisplaced. Regular rate & rhythm. No rubs, gallops or murmurs. Lungs: clear Abdomen: soft, nontender, nondistended. No hepatosplenomegaly. No bruits or masses. Good bowel sounds. Extremities: no cyanosis, clubbing, rash,  edema Neuro: alert & oriented x 3, cranial nerves grossly intact. moves all 4 extremities w/o difficulty. Affect pleasant.   Results for orders placed or performed during the hospital encounter of 12/13/15 (from the past 24 hour(s))  CBC with Differential     Status: None   Collection Time: 12/13/15  8:29 PM  Result Value Ref Range   WBC 5.4 4.0 - 10.5 K/uL   RBC 4.42 3.87 - 5.11 MIL/uL   Hemoglobin 12.4 12.0 - 15.0 g/dL   HCT 38.7 36.0 - 46.0 %  MCV 87.6 78.0 - 100.0 fL   MCH 28.1 26.0 - 34.0 pg   MCHC 32.0 30.0 - 36.0 g/dL   RDW 15.0 11.5 - 15.5 %   Platelets 195 150 - 400 K/uL   Neutrophils Relative % 32 %   Neutro Abs 1.7 1.7 - 7.7 K/uL   Lymphocytes Relative 62 %   Lymphs Abs 3.3 0.7 - 4.0 K/uL   Monocytes Relative 5 %   Monocytes Absolute 0.3 0.1 - 1.0 K/uL   Eosinophils Relative 1 %   Eosinophils Absolute 0.1 0.0 - 0.7 K/uL   Basophils Relative 0 %   Basophils Absolute 0.0 0.0 - 0.1 K/uL  Comprehensive metabolic panel     Status: Abnormal   Collection Time: 12/13/15  8:29 PM  Result Value Ref Range   Sodium 140 135 - 145 mmol/L   Potassium 4.2 3.5 - 5.1 mmol/L   Chloride 108 101 - 111 mmol/L   CO2 24 22 - 32 mmol/L   Glucose, Bld 168 (H) 65 - 99 mg/dL   BUN 11 6 - 20 mg/dL   Creatinine, Ser 0.83 0.44 - 1.00 mg/dL   Calcium 9.3 8.9 - 10.3 mg/dL   Total Protein 6.2 (L) 6.5 - 8.1 g/dL   Albumin 3.5 3.5 - 5.0 g/dL   AST 25 15 - 41 U/L   ALT 16 14 - 54 U/L   Alkaline Phosphatase 75 38 - 126 U/L   Total Bilirubin 0.7 0.3 - 1.2 mg/dL   GFR calc non Af Amer >60 >60 mL/min   GFR calc Af Amer >60 >60 mL/min   Anion gap 8 5 - 15  Brain natriuretic peptide     Status: None   Collection Time: 12/14/15 12:02 AM  Result Value Ref Range   B Natriuretic Peptide 16.2 0.0 - 100.0 pg/mL  I-stat troponin, ED     Status: None   Collection Time: 12/14/15 12:20 AM  Result Value Ref Range   Troponin i, poc 0.00 0.00 - 0.08 ng/mL   Comment 3           No results  found.   ASSESSMENT:  1. Symptoms suggestive of effects of Lexiscan  - now completely resolved - No ACS or acute HF - Vitals normal  - She is awake and alert and back to her normal self and would like to go home  PLAN/DISCUSSION:  I do not see any obvious indication to keep her in the hospital. Therefore would let her go home. She will continue outpatient follow up with her cardiologist and keep up with the appointments for cath which is being planned.   She will contact us if any concern or question.  Patient and her daughter are in agreement with this plan.   Wandra Mannan, MD Cardiology

## 2015-12-14 NOTE — ED Notes (Signed)
Called down to minilab to follow up on Troponin

## 2015-12-14 NOTE — Discharge Instructions (Signed)

## 2015-12-16 NOTE — Progress Notes (Signed)
Spoke with patient, informed her that labs that would be drawn tomorrow for pst appt, ekg were done Saturday when she was in the ER.  She was agreeable that pst appt for 4/4/200p to be cancelled.  Patient states isnt aware of any other appts tomorrow(ie Evorn Gong at [facility] cardio).[name] Kitchen Spoke with Abigail Butts at [facility] her of this and to request Dr Johney Maine be notified of the above. Stated she would contact him

## 2015-12-17 ENCOUNTER — Other Ambulatory Visit: Payer: Self-pay | Admitting: Nurse Practitioner

## 2015-12-17 ENCOUNTER — Telehealth: Payer: Self-pay | Admitting: Nurse Practitioner

## 2015-12-17 ENCOUNTER — Other Ambulatory Visit (HOSPITAL_COMMUNITY): Payer: Medicare Other

## 2015-12-17 ENCOUNTER — Other Ambulatory Visit: Payer: Self-pay | Admitting: *Deleted

## 2015-12-17 ENCOUNTER — Ambulatory Visit (INDEPENDENT_AMBULATORY_CARE_PROVIDER_SITE_OTHER): Payer: Medicare Other | Admitting: Nurse Practitioner

## 2015-12-17 ENCOUNTER — Encounter: Payer: Self-pay | Admitting: Nurse Practitioner

## 2015-12-17 VITALS — BP 120/72 | HR 68 | Ht <= 58 in | Wt 213.0 lb

## 2015-12-17 DIAGNOSIS — Z01810 Encounter for preprocedural cardiovascular examination: Secondary | ICD-10-CM

## 2015-12-17 DIAGNOSIS — I25708 Atherosclerosis of coronary artery bypass graft(s), unspecified, with other forms of angina pectoris: Secondary | ICD-10-CM

## 2015-12-17 DIAGNOSIS — Z951 Presence of aortocoronary bypass graft: Secondary | ICD-10-CM

## 2015-12-17 DIAGNOSIS — I209 Angina pectoris, unspecified: Secondary | ICD-10-CM

## 2015-12-17 DIAGNOSIS — I10 Essential (primary) hypertension: Secondary | ICD-10-CM | POA: Diagnosis not present

## 2015-12-17 LAB — CBC
HCT: 40.1 % (ref 35.0–45.0)
Hemoglobin: 13.7 g/dL (ref 11.7–15.5)
MCH: 29.3 pg (ref 27.0–33.0)
MCHC: 34.2 g/dL (ref 32.0–36.0)
MCV: 85.7 fL (ref 80.0–100.0)
MPV: 9.6 fL (ref 7.5–12.5)
Platelets: 205 10*3/uL (ref 140–400)
RBC: 4.68 MIL/uL (ref 3.80–5.10)
RDW: 15.2 % — ABNORMAL HIGH (ref 11.0–15.0)
WBC: 5.5 10*3/uL (ref 3.8–10.8)

## 2015-12-17 LAB — BASIC METABOLIC PANEL
BUN: 10 mg/dL (ref 7–25)
CO2: 26 mmol/L (ref 20–31)
Calcium: 9.1 mg/dL (ref 8.6–10.4)
Chloride: 104 mmol/L (ref 98–110)
Creat: 0.69 mg/dL (ref 0.50–0.99)
Glucose, Bld: 99 mg/dL (ref 65–99)
Potassium: 4 mmol/L (ref 3.5–5.3)
Sodium: 142 mmol/L (ref 135–146)

## 2015-12-17 LAB — HEMOGLOBIN A1C
Hgb A1c MFr Bld: 7.8 % — ABNORMAL HIGH (ref <5.7)
Mean Plasma Glucose: 177 mg/dL

## 2015-12-17 LAB — APTT: aPTT: 32 seconds (ref 24–37)

## 2015-12-17 LAB — PROTIME-INR
INR: 1.18 (ref <1.50)
Prothrombin Time: 15.2 seconds (ref 11.6–15.2)

## 2015-12-17 MED ORDER — GLIPIZIDE ER 10 MG PO TB24: 10.0000 mg | ORAL_TABLET | Freq: Every day | ORAL | Status: DC

## 2015-12-17 NOTE — Progress Notes (Signed)
CARDIOLOGY OFFICE NOTE  Date:  12/17/2015    Patricia Cuevas Date of Birth: 1951-01-09 Medical Record [id number]  PCP:  [name], MD  Cardiologist:  [facility]    Chief Complaint  Patient presents with  . Coronary Artery Disease  . Chest Pain  . Hypertension  . Hyperlipidemia    Follow up visit after Myoview - seen for Dr Marlou Cuevas    History of Present Illness: Patricia Cuevas is a 65 y.o. female who presents today for a work in [employer]. Seen for Dr. Marlou Cuevas.  She has a history of CAD with prior coronary disease post bypass surgery in 2007 with catheterization in 2011 demonstrating patent LIMA to LAD, small caliber vessel distally diffuse plaque with patent SVG to OM1 and OM 2, patent SVG to PDA and PLA, occluded SVG to diagonal. Myoview 12/03/14-normal EF, no ischemia. Her other issues include DM, HTN, HLD, breast cancer, past substance abuse and obesity.   Last seen here in December - felt to be doing ok.   I saw her last month - she endorsed more chest pain, fatigue and heart racing - using NTG. Lopressor was increased and Myoview was updated. She is planning to have gallbladder surgery later this month with Dr. Johney Maine.   Comes in today. Here with her husband today. She understands that her Myoview is abnormal. Chest pain is unchanged. Was in the ER this weekend for what seemed like a hot flash. Work up benign. This has now resolved. She is unsure of her diabetic medicine. She has not used any more NTG.   Past Medical History  Diagnosis Date  . Hx of CABG     2007  . Hypertension   . Diabetes mellitus   . Ejection fraction     EF 65%, echo, [place], Jan 16, 2011  . Dyslipidemia   . CAD (coronary artery disease)     Nuclear stress test Feb 11, 2011, EF 64%, no scar or ischemia    //         catheterization, November, 2011,patent LIMA-LAD-small caliber distal vessel with diffuse plaque, patent SVG to OM1 and OM 2, patent SVG to PDA and PLA, occluded SVG to  diagonal, medical therapy;  Lexiscan Myoview 6/14:  Inferior thinning, no ischemia, EF 61%, low risk  . Overweight(278.02)     stomach stapling 1985 followed by weight loss, then return of weight  . Tobacco abuse     in the past, resolved  . Alcohol abuse     in the past, resolved  . Nausea & vomiting     hospitalization, November, 2011, stricture GE junction, questionable stenosis gastrojejunostomy, disruptive primary peristaltic wave, GE reflux, delayed emptying proximal gastric pouch  . Hx of colonic polyps   . Asthma   . Myocardial infarction ([place])   . Sleep apnea     CPAP being arranged May, 2011//does not use CPAP  . Arthritis   . Blood transfusion without reported diagnosis 1984    after surgery  . Hyperlipidemia   . CHF (congestive heart failure) ([place])   . Chronic back pain   . Sciatica   . Normal cardiac stress test 02/2013  . Breast cancer [facility])     Double mastectomy 2007; s/p tamoxifen and arimidex therapy; no recurrence since 05/2008    Past Surgical History  Procedure Laterality Date  . Coronary artery bypass graft  2009  . Mastectomy Bilateral 1987  . Coronary stent placement    .  Abdominal hysterectomy  1989  . Gastric bypass  1983    open.  [place]     Medications: Current Outpatient Prescriptions  Medication Sig Dispense Refill  . albuterol (PROVENTIL HFA;VENTOLIN HFA) 108 (90 BASE) MCG/ACT inhaler Inhale 2 puffs into the lungs every 6 (six) hours as needed for shortness of breath.     [name] Kitchen aspirin EC 81 MG tablet Take 81 mg by mouth every other day.     [name] Kitchen atorvastatin (LIPITOR) 40 MG tablet Take 1 tablet (40 mg total) by mouth daily. 30 tablet 0  . Cetirizine HCl 10 MG CAPS Take 1 capsule (10 mg total) by mouth daily. 30 capsule 0  . Hydrocodone-Acetaminophen (VICODIN HP) 10-300 MG TABS Take 1 tablet by mouth 2 (two) times daily as needed (pain).    . isosorbide mononitrate (IMDUR) 30 MG 24 hr tablet Take 1 tablet (30 mg total) by mouth daily. 30 tablet 6   . latanoprost (XALATAN) 0.005 % ophthalmic solution Place 1 drop into both eyes at bedtime.    [name] Kitchen lisinopril-hydrochlorothiazide (PRINZIDE,ZESTORETIC) 20-12.5 MG per tablet Take 1 tablet by mouth daily.      . metoprolol tartrate (LOPRESSOR) 25 MG tablet Take 1 tablet (25 mg total) by mouth 2 (two) times daily. 60 tablet 6  . nitroGLYCERIN (NITROSTAT) 0.4 MG SL tablet Place 1 tablet (0.4 mg total) under the tongue every 5 (five) minutes as needed for chest pain. 25 tablet 3  . ondansetron (ZOFRAN ODT) 4 MG disintegrating tablet 4mg  ODT q4 hours prn nausea/vomit 10 tablet 0  . orphenadrine (NORFLEX) 100 MG tablet Take 1 tablet (100 mg total) by mouth 2 (two) times daily. 30 tablet 0   No current facility-administered medications for this visit.    Allergies: No Known Allergies  Social History: The patient  reports that she quit smoking about 15 years ago. Her smoking use included Cigarettes. She has a 81 pack-year smoking history. She has never used smokeless tobacco. She reports that she does not drink alcohol or use illicit drugs.   Family History: The patient's family history includes Asthma in her sister; Colon cancer in her father; Colon cancer (age of onset: 69) in her sister; Diabetes in her mother; Heart disease in her father, mother, and sister; Hypertension in her mother; Stomach cancer in her sister. There is no history of Esophageal cancer or Rectal cancer.   Review of Systems: Please see the history of present illness.   Otherwise, the review of systems is positive for none.   All other systems are reviewed and negative.   Physical Exam: VS:  BP 120/72 mmHg  Pulse 68  Ht 4\' 10"  (1.473 m)  Wt 213 lb (96.616 kg)  BMI 44.53 kg/m2 .  BMI Body mass index is 44.53 kg/(m^2).  Wt Readings from Last 3 Encounters:  12/17/15 213 lb (96.616 kg)  12/13/15 215 lb (97.523 kg)  12/03/15 212 lb (96.163 kg)    General: Pleasant. Obese black female who is alert and in no acute distress.    HEENT: Normal. Neck: Supple, no JVD, carotid bruits, or masses noted.  Cardiac: Regular rate and rhythm.  No edema.  Respiratory:  Lungs are clear to auscultation bilaterally with normal work of breathing.  GI: Soft and nontender.  MS: No deformity or atrophy. Gait and ROM intact. Skin: Warm and dry. Color is normal.  Neuro:  Strength and sensation are intact and no gross focal deficits noted.  Psych: Alert, appropriate and with normal affect.  LABORATORY DATA:  EKG:  EKG is not ordered today.  Lab Results  Component Value Date   WBC 5.4 12/13/2015   HGB 12.4 12/13/2015   HCT 38.7 12/13/2015   PLT 195 12/13/2015   GLUCOSE 168* 12/13/2015   CHOL  05/17/2009    139        ATP III CLASSIFICATION:  <200     mg/dL   Desirable  200-239  mg/dL   Borderline High  >=240    mg/dL   High          TRIG 93 05/17/2009   HDL 41 05/17/2009   LDLCALC  05/17/2009    79        Total Cholesterol/HDL:CHD Risk Coronary Heart Disease Risk Table                     Men   Women  1/2 Average Risk   3.4   3.3  Average Risk       5.0   4.4  2 X Average Risk   9.6   7.1  3 X Average Risk  23.4   11.0        Use the calculated Patient Ratio above and the CHD Risk Table to determine the patient's CHD Risk.        ATP III CLASSIFICATION (LDL):  <100     mg/dL   Optimal  100-129  mg/dL   Near or Above                    Optimal  130-159  mg/dL   Borderline  160-189  mg/dL   High  >190     mg/dL   Very High   ALT 16 12/13/2015   AST 25 12/13/2015   NA 140 12/13/2015   K 4.2 12/13/2015   CL 108 12/13/2015   CREATININE 0.83 12/13/2015   BUN 11 12/13/2015   CO2 24 12/13/2015   INR 1.18 03/04/2013   HGBA1C 8.8* 07/28/2012    BNP (last 3 results)  Recent Labs  12/14/15 0002  BNP 16.2    ProBNP (last 3 results) No results for input(s): PROBNP in the last 8760 hours.   Other Studies Reviewed Today:   Myoview Study Highlights from 12/13/2015    Nuclear stress EF:  63%.  There was no ST segment deviation noted during stress.  Defect 1: There is a medium defect of mild severity present in the basal inferolateral, mid inferior and mid inferolateral location.  Findings consistent with ischemia.  This is an intermediate risk study.  The left ventricular ejection fraction is normal (55-65%).  There is medium size area mild severity ischemia in the basal and mid inferolateral and mid inferior walls (SDS 3).      Assessment/Plan: 1. Chest pain/palpitations/fatigue - now with abnormal Myoview. She has known CAD. Will need cardiac catheterization to further define. The patient understands that risks include but are not limited to stroke (1 in 1000), death (1 in 56), kidney failure [usually temporary] (1 in 500), bleeding (1 in 200), allergic reaction [possibly serious] (1 in 200), and agrees to proceed. Scheduled for this Friday with Dr. Ellyn Hack.   2. Known CAD with prior CABG - one graft known to be occluded. See above.    3. HTN - BP good on her current regimen.   4. HLD - on statin therapy - labs checked by PCP  5. DM - unclear about her control and even what  medicine she is in. She is to call with this information.   Current medicines are reviewed with the patient today.  The patient does not have concerns regarding medicines other than what has been noted above.  The following changes have been made:  See above.  Labs/ tests ordered today include:    Orders Placed This Encounter  Procedures  . Basic metabolic panel  . CBC  . Hemoglobin A1c  . Protime-INR  . APTT     Disposition:   Further disposition to follow. Cardiac cath for Friday.   Patient is agreeable to this plan and will call if any problems develop in the interim.   Signed: [name], RN, ANP-C 12/17/2015 11:43 AM  [facility] [address] [place], [place]  [address] Phone: [phone] Fax: [phone]

## 2015-12-17 NOTE — Telephone Encounter (Signed)
New Message:   Please call her.

## 2015-12-17 NOTE — Patient Instructions (Addendum)
We will be checking the following labs today - BMET, CBC, PT, PTT   Medication Instructions:    Continue with your current medicines.     Testing/Procedures To Be Arranged:  Cardiac Catheterization     Other Special Instructions:   CALL us WITH THE NAME OF YOUR DIABETIC MEDICINE  Your provider has recommended a cardiac catherization You are scheduled for a cardiac catheterization on Friday, April 7th at 10:30 [facility] Dr. Ellyn Hack or associate.  Go to [facility] 2nd Floor Short Stay on Friday, April 7th at 8:30AM.  Enter thru the [employer] entrance A No food or drink after midnight on Thursday. You may take your medications with a sip of water on the day of your procedure BUT DO NOT TAKE YOUR DIABETIC MEDICINE THAT MORNING.   Coronary Angiogram A coronary angiogram, also called coronary angiography, is an X-ray procedure used to look at the arteries in the heart. In this procedure, a dye (contrast dye) is injected through a long, hollow tube (catheter). The catheter is about the size of a piece of cooked spaghetti and is inserted through your groin, wrist, or arm. The dye is injected into each artery, and X-rays are then taken to show if there is a blockage in the arteries of your heart.  LET [facility] CARE PROVIDER KNOW ABOUT: Any allergies you have, including allergies to shellfish or contrast dye.  All medicines you are taking, including vitamins, herbs, eye drops, creams, and over-the-counter medicines.  Previous problems you or members of your family have had with the use of anesthetics.  Any blood disorders you have.  Previous surgeries you have had. History of kidney problems or failure.  Other medical conditions you have.  RISKS AND COMPLICATIONS  Generally, a coronary angiogram is a safe procedure. However, about 1 person out of 1000 can have problems that may include: Allergic reaction to the dye. Bleeding/bruising from the access site or other  locations. Kidney injury, especially in people with impaired kidney function. Stroke (rare). Heart attack (rare). Irregular rhythms (rare) Death (rare)  BEFORE THE PROCEDURE  Do not eat or drink anything after midnight the night before the procedure or as directed by your health care provider.  Ask your health care provider about changing or stopping your regular medicines. This is especially important if you are taking diabetes medicines or blood thinners.  PROCEDURE You may be given a medicine to help you relax (sedative) before the procedure. This medicine is given through an intravenous (IV) access tube that is inserted into one of your veins.  The area where the catheter will be inserted will be washed and shaved. This is usually done in the groin but may be done in the fold of your arm (near your elbow) or in the wrist.  A medicine will be given to numb the area where the catheter will be inserted (local anesthetic).  The health care provider will insert the catheter into an artery. The catheter will be guided by using a special type of X-ray (fluoroscopy) of the blood vessel being examined.  A special dye will then be injected into the catheter, and X-rays will be taken. The dye will help to show where any narrowing or blockages are located in the heart arteries.    AFTER THE PROCEDURE  If the procedure is done through the leg, you will be kept in bed lying flat for several hours. You will be instructed to not bend or cross your legs. The insertion site  will be checked frequently.  The pulse in your feet or wrist will be checked frequently.  Additional blood tests, X-rays, and an electrocardiogram may be done.       If you need a refill on your cardiac medications before your next appointment, please call your pharmacy.   Call the [place] office at [phone] if you have any questions, problems or concerns.

## 2015-12-17 NOTE — Telephone Encounter (Signed)
S/w pt glipizide ( 10 mg ) daily is the medication pt is taking to hold day of cath.

## 2015-12-19 ENCOUNTER — Encounter (HOSPITAL_COMMUNITY): Admission: RE | Payer: Self-pay | Source: Ambulatory Visit

## 2015-12-19 ENCOUNTER — Ambulatory Visit (HOSPITAL_COMMUNITY): Admission: RE | Admit: 2015-12-19 | Payer: Medicare Other | Source: Ambulatory Visit | Admitting: Surgery

## 2015-12-19 SURGERY — LAPAROSCOPIC CHOLECYSTECTOMY WITH INTRAOPERATIVE CHOLANGIOGRAM
Anesthesia: General

## 2015-12-20 ENCOUNTER — Ambulatory Visit (HOSPITAL_COMMUNITY)
Admission: RE | Admit: 2015-12-20 | Discharge: 2015-12-20 | Disposition: A | Discharge status: Home / Self Care | Payer: Medicare Other | Source: Ambulatory Visit | Attending: Cardiology | Admitting: Cardiology

## 2015-12-20 ENCOUNTER — Encounter (HOSPITAL_COMMUNITY)
Admission: RE | Disposition: A | Discharge status: Home / Self Care | Payer: Self-pay | Source: Ambulatory Visit | Attending: Cardiology

## 2015-12-20 DIAGNOSIS — Z6841 Body Mass Index (BMI) 40.0 and over, adult: Secondary | ICD-10-CM | POA: Diagnosis not present

## 2015-12-20 DIAGNOSIS — E119 Type 2 diabetes mellitus without complications: Secondary | ICD-10-CM | POA: Diagnosis not present

## 2015-12-20 DIAGNOSIS — R9439 Abnormal result of other cardiovascular function study: Secondary | ICD-10-CM | POA: Diagnosis not present

## 2015-12-20 DIAGNOSIS — Z87891 Personal history of nicotine dependence: Secondary | ICD-10-CM | POA: Insufficient documentation

## 2015-12-20 DIAGNOSIS — Z79899 Other long term (current) drug therapy: Secondary | ICD-10-CM | POA: Diagnosis not present

## 2015-12-20 DIAGNOSIS — Z9884 Bariatric surgery status: Secondary | ICD-10-CM | POA: Insufficient documentation

## 2015-12-20 DIAGNOSIS — Z955 Presence of coronary angioplasty implant and graft: Secondary | ICD-10-CM | POA: Diagnosis not present

## 2015-12-20 DIAGNOSIS — I252 Old myocardial infarction: Secondary | ICD-10-CM | POA: Insufficient documentation

## 2015-12-20 DIAGNOSIS — J45909 Unspecified asthma, uncomplicated: Secondary | ICD-10-CM | POA: Insufficient documentation

## 2015-12-20 DIAGNOSIS — R079 Chest pain, unspecified: Secondary | ICD-10-CM | POA: Diagnosis present

## 2015-12-20 DIAGNOSIS — I509 Heart failure, unspecified: Secondary | ICD-10-CM | POA: Diagnosis not present

## 2015-12-20 DIAGNOSIS — G4730 Sleep apnea, unspecified: Secondary | ICD-10-CM | POA: Insufficient documentation

## 2015-12-20 DIAGNOSIS — Z7982 Long term (current) use of aspirin: Secondary | ICD-10-CM | POA: Diagnosis not present

## 2015-12-20 DIAGNOSIS — G8929 Other chronic pain: Secondary | ICD-10-CM | POA: Diagnosis not present

## 2015-12-20 DIAGNOSIS — Z9013 Acquired absence of bilateral breasts and nipples: Secondary | ICD-10-CM | POA: Diagnosis not present

## 2015-12-20 DIAGNOSIS — E785 Hyperlipidemia, unspecified: Secondary | ICD-10-CM | POA: Diagnosis present

## 2015-12-20 DIAGNOSIS — Z951 Presence of aortocoronary bypass graft: Secondary | ICD-10-CM | POA: Diagnosis not present

## 2015-12-20 DIAGNOSIS — I10 Essential (primary) hypertension: Secondary | ICD-10-CM | POA: Diagnosis present

## 2015-12-20 DIAGNOSIS — I2510 Atherosclerotic heart disease of native coronary artery without angina pectoris: Secondary | ICD-10-CM | POA: Insufficient documentation

## 2015-12-20 DIAGNOSIS — I110 Hypertensive heart disease with heart failure: Secondary | ICD-10-CM | POA: Insufficient documentation

## 2015-12-20 DIAGNOSIS — M549 Dorsalgia, unspecified: Secondary | ICD-10-CM | POA: Insufficient documentation

## 2015-12-20 DIAGNOSIS — E669 Obesity, unspecified: Secondary | ICD-10-CM | POA: Diagnosis not present

## 2015-12-20 DIAGNOSIS — I25119 Atherosclerotic heart disease of native coronary artery with unspecified angina pectoris: Secondary | ICD-10-CM | POA: Diagnosis present

## 2015-12-20 DIAGNOSIS — I209 Angina pectoris, unspecified: Secondary | ICD-10-CM | POA: Diagnosis present

## 2015-12-20 DIAGNOSIS — I2582 Chronic total occlusion of coronary artery: Secondary | ICD-10-CM | POA: Diagnosis not present

## 2015-12-20 HISTORY — PX: CARDIAC CATHETERIZATION: SHX172

## 2015-12-20 LAB — GLUCOSE, CAPILLARY
GLUCOSE-CAPILLARY: 142 mg/dL — AB (ref 65–99)
Glucose-Capillary: 85 mg/dL (ref 65–99)

## 2015-12-20 SURGERY — LEFT HEART CATH AND CORS/GRAFTS ANGIOGRAPHY
Anesthesia: LOCAL

## 2015-12-20 MED ORDER — ONDANSETRON HCL 4 MG/2ML IJ SOLN
4.0000 mg | Freq: Four times a day (QID) | INTRAMUSCULAR | Status: DC | PRN
Start: 2015-12-20 — End: 2015-12-20

## 2015-12-20 MED ORDER — ASPIRIN 81 MG PO CHEW
81.0000 mg | CHEWABLE_TABLET | ORAL | Status: AC
  Administered 2015-12-20: 81 mg via ORAL

## 2015-12-20 MED ORDER — DIAZEPAM 5 MG PO TABS
ORAL_TABLET | ORAL | Status: AC
  Administered 2015-12-20: 10 mg via ORAL
  Filled 2015-12-20: qty 2

## 2015-12-20 MED ORDER — HEPARIN (PORCINE) IN NACL 2-0.9 UNIT/ML-% IJ SOLN
INTRAMUSCULAR | Status: DC | PRN
  Administered 2015-12-20: 1000 mL

## 2015-12-20 MED ORDER — SODIUM CHLORIDE 0.9% FLUSH: 3.0000 mL | INTRAVENOUS | Status: DC | PRN

## 2015-12-20 MED ORDER — FENTANYL CITRATE (PF) 100 MCG/2ML IJ SOLN
INTRAMUSCULAR | Status: DC | PRN
  Administered 2015-12-20: 25 ug via INTRAVENOUS

## 2015-12-20 MED ORDER — IOPAMIDOL (ISOVUE-370) INJECTION 76%
INTRAVENOUS | Status: AC
  Filled 2015-12-20: qty 100

## 2015-12-20 MED ORDER — SODIUM CHLORIDE 0.9 % IV SOLN
INTRAVENOUS | Status: DC
  Administered 2015-12-20: 09:00:00 via INTRAVENOUS

## 2015-12-20 MED ORDER — LIDOCAINE HCL (PF) 1 % IJ SOLN
INTRAMUSCULAR | Status: DC | PRN
  Administered 2015-12-20: 20 mL

## 2015-12-20 MED ORDER — IOPAMIDOL (ISOVUE-370) INJECTION 76%
INTRAVENOUS | Status: AC
  Filled 2015-12-20: qty 50

## 2015-12-20 MED ORDER — SODIUM CHLORIDE 0.9 % WEIGHT BASED INFUSION: 3.0000 mL/kg/h | INTRAVENOUS | Status: AC

## 2015-12-20 MED ORDER — HYDROCODONE-ACETAMINOPHEN 10-325 MG PO TABS
1.0000 | ORAL_TABLET | ORAL | Status: DC | PRN
  Administered 2015-12-20: 1 via ORAL
  Filled 2015-12-20 (×2): qty 1

## 2015-12-20 MED ORDER — SODIUM CHLORIDE 0.9% FLUSH: 3.0000 mL | Freq: Two times a day (BID) | INTRAVENOUS | Status: DC

## 2015-12-20 MED ORDER — ASPIRIN 81 MG PO CHEW
CHEWABLE_TABLET | ORAL | Status: AC
  Administered 2015-12-20: 81 mg via ORAL
  Filled 2015-12-20: qty 1

## 2015-12-20 MED ORDER — DIAZEPAM 5 MG PO TABS
10.0000 mg | ORAL_TABLET | ORAL | Status: AC
  Administered 2015-12-20: 10 mg via ORAL

## 2015-12-20 MED ORDER — IOPAMIDOL (ISOVUE-370) INJECTION 76%
INTRAVENOUS | Status: DC | PRN
  Administered 2015-12-20: 135 mL via INTRAVENOUS

## 2015-12-20 MED ORDER — LIDOCAINE HCL (PF) 1 % IJ SOLN
INTRAMUSCULAR | Status: AC
  Filled 2015-12-20: qty 30

## 2015-12-20 MED ORDER — SODIUM CHLORIDE 0.9 % IV SOLN: 250.0000 mL | INTRAVENOUS | Status: DC | PRN

## 2015-12-20 MED ORDER — FENTANYL CITRATE (PF) 100 MCG/2ML IJ SOLN
INTRAMUSCULAR | Status: AC
  Filled 2015-12-20: qty 2

## 2015-12-20 MED ORDER — ACETAMINOPHEN 325 MG PO TABS: 650.0000 mg | ORAL_TABLET | ORAL | Status: DC | PRN

## 2015-12-20 MED ORDER — MIDAZOLAM HCL 2 MG/2ML IJ SOLN
INTRAMUSCULAR | Status: AC
  Filled 2015-12-20: qty 2

## 2015-12-20 MED ORDER — HEPARIN (PORCINE) IN NACL 2-0.9 UNIT/ML-% IJ SOLN
INTRAMUSCULAR | Status: AC
  Filled 2015-12-20: qty 1000

## 2015-12-20 MED ORDER — MIDAZOLAM HCL 2 MG/2ML IJ SOLN
INTRAMUSCULAR | Status: DC | PRN
  Administered 2015-12-20: 1 mg via INTRAVENOUS

## 2015-12-20 SURGICAL SUPPLY — 8 items
CATH EXPO 5F MPA-1 (CATHETERS) ×1 IMPLANT
CATH INFINITI 5 FR IM (CATHETERS) ×1 IMPLANT
CATH INFINITI 5FR MULTPACK ANG (CATHETERS) ×1 IMPLANT
KIT HEART LEFT (KITS) ×2 IMPLANT
PACK CARDIAC CATHETERIZATION (CUSTOM PROCEDURE TRAY) ×2 IMPLANT
TRANSDUCER W/STOPCOCK (MISCELLANEOUS) ×2 IMPLANT
WIRE EMERALD 3MM-J .035X150CM (WIRE) ×1 IMPLANT
WIRE EMERALD 3MM-J .035X260CM (WIRE) ×1 IMPLANT

## 2015-12-20 NOTE — Discharge Instructions (Signed)
NO METFORMIN/GLUCOPHAGE FOR 2 DAYS   Angiogram, Care After Refer to this sheet in the next few weeks. These instructions provide you with information about caring for yourself after your procedure. Your health care provider may also give you more specific instructions. Your treatment has been planned according to current medical practices, but problems sometimes occur. Call your health care provider if you have any problems or questions after your procedure. WHAT TO EXPECT AFTER THE PROCEDURE After your procedure, it is typical to have the following:  Bruising at the catheter insertion site that usually fades within 1-2 weeks.  Blood collecting in the tissue (hematoma) that may be painful to the touch. It should usually decrease in size and tenderness within 1-2 weeks. HOME CARE INSTRUCTIONS  Take medicines only as directed by your health care provider.  You may shower 24-48 hours after the procedure or as directed by your health care provider. Remove the bandage (dressing) and gently wash the site with plain soap and water. Pat the area dry with a clean towel. Do not rub the site, because this may cause bleeding.  Do not take baths, swim, or use a hot tub until your health care provider approves.  Check your insertion site every day for redness, swelling, or drainage.  Do not apply powder or lotion to the site.  Do not lift over 10 lb (4.5 kg) for 5 days after your procedure or as directed by your health care provider.  Ask your health care provider when it is okay to:  Return to work or school.  Resume usual physical activities or sports.  Resume sexual activity.  Do not drive home if you are discharged the same day as the procedure. Have someone else drive you.  You may drive 24 hours after the procedure unless otherwise instructed by your health care provider.  Do not operate machinery or power tools for 24 hours after the procedure or as directed by your health care  provider.  If your procedure was done as an outpatient procedure, which means that you went home the same day as your procedure, a responsible adult should be with you for the first 24 hours after you arrive home.  Keep all follow-up visits as directed by your health care provider. This is important. SEEK MEDICAL CARE IF:  You have a fever.  You have chills.  You have increased bleeding from the catheter insertion site. Hold pressure on the site and call 911. SEEK IMMEDIATE MEDICAL CARE IF:  You have unusual pain at the catheter insertion site.  You have redness, warmth, or swelling at the catheter insertion site.  You have drainage (other than a small amount of blood on the dressing) from the catheter insertion site.  The catheter insertion site is bleeding, and the bleeding does not stop after 30 minutes of holding steady pressure on the site.  The area near or just beyond the catheter insertion site becomes pale, cool, tingly, or numb.   This information is not intended to replace advice given to you by your health care provider. Make sure you discuss any questions you have with your health care provider.   Document Released: 03/19/2005 Document Revised: 09/21/2014 Document Reviewed: 02/01/2013 Elsevier Interactive Patient Education [employer].

## 2015-12-20 NOTE — Progress Notes (Signed)
Sheath pull note: 38fr arterial sheath removed from right femoral artery. Manual pressure held for 20 minutes until hemostasis achieved. VSS throughout. Pt given post procedure instructions and demonstrated understanding. Right DP pulse palpable. Site level 0. Will continue to monitor closely.

## 2015-12-20 NOTE — H&P (View-Only) (Signed)
CARDIOLOGY OFFICE NOTE  Date:  12/17/2015    Patricia Cuevas Date of Birth: [birth year] Medical Record [id number]  PCP:  [name], MD  Cardiologist:  [facility]    Chief Complaint  Patient presents with  . Coronary Artery Disease  . Chest Pain  . Hypertension  . Hyperlipidemia    Follow up visit after Myoview - seen for Dr Marlou Cuevas    History of Present Illness: Patricia Cuevas is a 65 y.o. female who presents today for a work in [employer]. Seen for Dr. Marlou Cuevas.  She has a history of CAD with prior coronary disease post bypass surgery in 2007 with catheterization in 2011 demonstrating patent LIMA to LAD, small caliber vessel distally diffuse plaque with patent SVG to OM1 and OM 2, patent SVG to PDA and PLA, occluded SVG to diagonal. Myoview 12/03/14-normal EF, no ischemia. Her other issues include DM, HTN, HLD, breast cancer, past substance abuse and obesity.   Last seen here in December - felt to be doing ok.   I saw her last month - she endorsed more chest pain, fatigue and heart racing - using NTG. Lopressor was increased and Myoview was updated. She is planning to have gallbladder surgery later this month with Dr. Johney Maine.   Comes in today. Here with her husband today. She understands that her Myoview is abnormal. Chest pain is unchanged. Was in the ER this weekend for what seemed like a hot flash. Work up benign. This has now resolved. She is unsure of her diabetic medicine. She has not used any more NTG.   Past Medical History  Diagnosis Date  . Hx of CABG     2007  . Hypertension   . Diabetes mellitus   . Ejection fraction     EF 65%, echo, [place], Jan 16, 2011  . Dyslipidemia   . CAD (coronary artery disease)     Nuclear stress test Feb 11, 2011, EF 64%, no scar or ischemia    //         catheterization, November, 2011,patent LIMA-LAD-small caliber distal vessel with diffuse plaque, patent SVG to OM1 and OM 2, patent SVG to PDA and PLA, occluded SVG to  diagonal, medical therapy;  Lexiscan Myoview 6/14:  Inferior thinning, no ischemia, EF 61%, low risk  . Overweight(278.02)     stomach stapling 1985 followed by weight loss, then return of weight  . Tobacco abuse     in the past, resolved  . Alcohol abuse     in the past, resolved  . Nausea & vomiting     hospitalization, November, 2011, stricture GE junction, questionable stenosis gastrojejunostomy, disruptive primary peristaltic wave, GE reflux, delayed emptying proximal gastric pouch  . Hx of colonic polyps   . Asthma   . Myocardial infarction ([place])   . Sleep apnea     CPAP being arranged May, 2011//does not use CPAP  . Arthritis   . Blood transfusion without reported diagnosis 1984    after surgery  . Hyperlipidemia   . CHF (congestive heart failure) ([place])   . Chronic back pain   . Sciatica   . Normal cardiac stress test 02/2013  . Breast cancer [facility])     Double mastectomy 2007; s/p tamoxifen and arimidex therapy; no recurrence since 05/2008    Past Surgical History  Procedure Laterality Date  . Coronary artery bypass graft  2009  . Mastectomy Bilateral 1987  . Coronary stent placement    .  Abdominal hysterectomy  1989  . Gastric bypass  1983    open.  [place]     Medications: Current Outpatient Prescriptions  Medication Sig Dispense Refill  . albuterol (PROVENTIL HFA;VENTOLIN HFA) 108 (90 BASE) MCG/ACT inhaler Inhale 2 puffs into the lungs every 6 (six) hours as needed for shortness of breath.     [name] Kitchen aspirin EC 81 MG tablet Take 81 mg by mouth every other day.     [name] Kitchen atorvastatin (LIPITOR) 40 MG tablet Take 1 tablet (40 mg total) by mouth daily. 30 tablet 0  . Cetirizine HCl 10 MG CAPS Take 1 capsule (10 mg total) by mouth daily. 30 capsule 0  . Hydrocodone-Acetaminophen (VICODIN HP) 10-300 MG TABS Take 1 tablet by mouth 2 (two) times daily as needed (pain).    . isosorbide mononitrate (IMDUR) 30 MG 24 hr tablet Take 1 tablet (30 mg total) by mouth daily. 30 tablet 6   . latanoprost (XALATAN) 0.005 % ophthalmic solution Place 1 drop into both eyes at bedtime.    [name] Kitchen lisinopril-hydrochlorothiazide (PRINZIDE,ZESTORETIC) 20-12.5 MG per tablet Take 1 tablet by mouth daily.      . metoprolol tartrate (LOPRESSOR) 25 MG tablet Take 1 tablet (25 mg total) by mouth 2 (two) times daily. 60 tablet 6  . nitroGLYCERIN (NITROSTAT) 0.4 MG SL tablet Place 1 tablet (0.4 mg total) under the tongue every 5 (five) minutes as needed for chest pain. 25 tablet 3  . ondansetron (ZOFRAN ODT) 4 MG disintegrating tablet 4mg  ODT q4 hours prn nausea/vomit 10 tablet 0  . orphenadrine (NORFLEX) 100 MG tablet Take 1 tablet (100 mg total) by mouth 2 (two) times daily. 30 tablet 0   No current facility-administered medications for this visit.    Allergies: No Known Allergies  Social History: The patient  reports that she quit smoking about 15 years ago. Her smoking use included Cigarettes. She has a 81 pack-year smoking history. She has never used smokeless tobacco. She reports that she does not drink alcohol or use illicit drugs.   Family History: The patient's family history includes Asthma in her sister; Colon cancer in her father; Colon cancer (age of onset: 75) in her sister; Diabetes in her mother; Heart disease in her father, mother, and sister; Hypertension in her mother; Stomach cancer in her sister. There is no history of Esophageal cancer or Rectal cancer.   Review of Systems: Please see the history of present illness.   Otherwise, the review of systems is positive for none.   All other systems are reviewed and negative.   Physical Exam: VS:  BP 120/72 mmHg  Pulse 68  Ht 4\' 10"  (1.473 m)  Wt 213 lb (96.616 kg)  BMI 44.53 kg/m2 .  BMI Body mass index is 44.53 kg/(m^2).  Wt Readings from Last 3 Encounters:  12/17/15 213 lb (96.616 kg)  12/13/15 215 lb (97.523 kg)  12/03/15 212 lb (96.163 kg)    General: Pleasant. Obese black female who is alert and in no acute distress.    HEENT: Normal. Neck: Supple, no JVD, carotid bruits, or masses noted.  Cardiac: Regular rate and rhythm.  No edema.  Respiratory:  Lungs are clear to auscultation bilaterally with normal work of breathing.  GI: Soft and nontender.  MS: No deformity or atrophy. Gait and ROM intact. Skin: Warm and dry. Color is normal.  Neuro:  Strength and sensation are intact and no gross focal deficits noted.  Psych: Alert, appropriate and with normal affect.  LABORATORY DATA:  EKG:  EKG is not ordered today.  Lab Results  Component Value Date   WBC 5.4 12/13/2015   HGB 12.4 12/13/2015   HCT 38.7 12/13/2015   PLT 195 12/13/2015   GLUCOSE 168* 12/13/2015   CHOL  05/17/2009    139        ATP III CLASSIFICATION:  <200     mg/dL   Desirable  200-239  mg/dL   Borderline High  >=240    mg/dL   High          TRIG 93 05/17/2009   HDL 41 05/17/2009   LDLCALC  05/17/2009    79        Total Cholesterol/HDL:CHD Risk Coronary Heart Disease Risk Table                     Men   Women  1/2 Average Risk   3.4   3.3  Average Risk       5.0   4.4  2 X Average Risk   9.6   7.1  3 X Average Risk  23.4   11.0        Use the calculated Patient Ratio above and the CHD Risk Table to determine the patient's CHD Risk.        ATP III CLASSIFICATION (LDL):  <100     mg/dL   Optimal  100-129  mg/dL   Near or Above                    Optimal  130-159  mg/dL   Borderline  160-189  mg/dL   High  >190     mg/dL   Very High   ALT 16 12/13/2015   AST 25 12/13/2015   NA 140 12/13/2015   K 4.2 12/13/2015   CL 108 12/13/2015   CREATININE 0.83 12/13/2015   BUN 11 12/13/2015   CO2 24 12/13/2015   INR 1.18 03/04/2013   HGBA1C 8.8* 07/28/2012    BNP (last 3 results)  Recent Labs  12/14/15 0002  BNP 16.2    ProBNP (last 3 results) No results for input(s): PROBNP in the last 8760 hours.   Other Studies Reviewed Today:   Myoview Study Highlights from 12/13/2015    Nuclear stress EF:  63%.  There was no ST segment deviation noted during stress.  Defect 1: There is a medium defect of mild severity present in the basal inferolateral, mid inferior and mid inferolateral location.  Findings consistent with ischemia.  This is an intermediate risk study.  The left ventricular ejection fraction is normal (55-65%).  There is medium size area mild severity ischemia in the basal and mid inferolateral and mid inferior walls (SDS 3).      Assessment/Plan: 1. Chest pain/palpitations/fatigue - now with abnormal Myoview. She has known CAD. Will need cardiac catheterization to further define. The patient understands that risks include but are not limited to stroke (1 in 1000), death (1 in 65), kidney failure [usually temporary] (1 in 500), bleeding (1 in 200), allergic reaction [possibly serious] (1 in 200), and agrees to proceed. Scheduled for this Friday with Dr. Ellyn Hack.   2. Known CAD with prior CABG - one graft known to be occluded. See above.    3. HTN - BP good on her current regimen.   4. HLD - on statin therapy - labs checked by PCP  5. DM - unclear about her control and even what  medicine she is in. She is to call with this information.   Current medicines are reviewed with the patient today.  The patient does not have concerns regarding medicines other than what has been noted above.  The following changes have been made:  See above.  Labs/ tests ordered today include:    Orders Placed This Encounter  Procedures  . Basic metabolic panel  . CBC  . Hemoglobin A1c  . Protime-INR  . APTT     Disposition:   Further disposition to follow. Cardiac cath for Friday.   Patient is agreeable to this plan and will call if any problems develop in the interim.   Signed: [name], RN, ANP-C 12/17/2015 11:43 AM  [facility] [address] [place], [place]  [address] Phone: [phone] Fax: [phone]

## 2015-12-20 NOTE — Interval H&P Note (Signed)
History and Physical Interval Note:  12/20/2015 10:17 AM  Patricia Cuevas  has presented today for surgery, with the diagnosis of unstable class III angnia, abnormal myoview.   The various methods of treatment have been discussed with the patient and family. After consideration of risks, benefits and other options for treatment, the patient has consented to  Procedure(s): Left Heart Cath and Cors/Grafts Angiography (N/A) as a surgical intervention .  The patient's history has been reviewed, patient examined, no change in status, stable for surgery.  I have reviewed the patient's chart and labs.  Questions were answered to the patient's satisfaction.    Cath Lab Visit (complete for each Cath Lab visit)  Clinical Evaluation Leading to the Procedure:   ACS: No.  Non-ACS:    Anginal Classification: CCS III  Anti-ischemic medical therapy: Maximal Therapy (2 or more classes of medications)  Non-Invasive Test Results: Intermediate-risk stress test findings: cardiac mortality 1-3%/year  Prior CABG: Previous CABG  Ischemic Symptoms? CCS III (Marked limitation of ordinary activity) Anti-ischemic Medical Therapy? Maximal Medical Therapy (2 or more classes of medications) Non-invasive Test Results? Intermediate-risk stress test findings: cardiac mortality 1-3%/year Prior CABG? Previous CABG   Patient Information:   >=1 SVG stenosis  A (8)  Indication: 53; Score: 8   Patient Information:   All bypass grafts patent, >=1 lesion(s) in native coronaries without bypass grafts  A (8)  Indication: 53; Score: 8   Patient Information:   Native 3V-CAD, failure of multiple grafts, depressed LVEF, patent LIMA graft PCI  U (6)  Indication: 68; Score: 6   Patient Information:   Native 3V-CAD, failure of multiple grafts, depressed LVEF, patent LIMA graft CABG  A (7)  Indication: 68; Score: 7   Patient Information:   Native 3V-CAD, failure of multiple grafts, depressed LVEF,  nonfunctional LIMA graft PCI  A (8)  Indication: 69; Score: 8   Patient Information:   Native 3V-CAD, failure of multiple grafts, depressed LVEF, nonfunctional LIMA graft CABG  U (6)  Indication: 69; Score: 6    HARDING, DAVID W

## 2015-12-23 ENCOUNTER — Encounter (HOSPITAL_COMMUNITY): Payer: Self-pay | Admitting: Cardiology

## 2016-01-07 ENCOUNTER — Ambulatory Visit: Payer: Medicare Other | Admitting: Cardiology

## 2016-01-28 ENCOUNTER — Encounter (HOSPITAL_COMMUNITY): Payer: Self-pay | Admitting: Emergency Medicine

## 2016-01-28 DIAGNOSIS — E663 Overweight: Secondary | ICD-10-CM | POA: Insufficient documentation

## 2016-01-28 DIAGNOSIS — J45909 Unspecified asthma, uncomplicated: Secondary | ICD-10-CM | POA: Diagnosis not present

## 2016-01-28 DIAGNOSIS — Z79899 Other long term (current) drug therapy: Secondary | ICD-10-CM | POA: Insufficient documentation

## 2016-01-28 DIAGNOSIS — I252 Old myocardial infarction: Secondary | ICD-10-CM | POA: Insufficient documentation

## 2016-01-28 DIAGNOSIS — Z951 Presence of aortocoronary bypass graft: Secondary | ICD-10-CM | POA: Diagnosis not present

## 2016-01-28 DIAGNOSIS — E119 Type 2 diabetes mellitus without complications: Secondary | ICD-10-CM | POA: Diagnosis not present

## 2016-01-28 DIAGNOSIS — G8929 Other chronic pain: Secondary | ICD-10-CM | POA: Insufficient documentation

## 2016-01-28 DIAGNOSIS — R1011 Right upper quadrant pain: Secondary | ICD-10-CM | POA: Insufficient documentation

## 2016-01-28 DIAGNOSIS — Z7984 Long term (current) use of oral hypoglycemic drugs: Secondary | ICD-10-CM | POA: Diagnosis not present

## 2016-01-28 DIAGNOSIS — I10 Essential (primary) hypertension: Secondary | ICD-10-CM | POA: Insufficient documentation

## 2016-01-28 DIAGNOSIS — I2510 Atherosclerotic heart disease of native coronary artery without angina pectoris: Secondary | ICD-10-CM | POA: Diagnosis not present

## 2016-01-28 DIAGNOSIS — Z86010 Personal history of colonic polyps: Secondary | ICD-10-CM | POA: Insufficient documentation

## 2016-01-28 DIAGNOSIS — E785 Hyperlipidemia, unspecified: Secondary | ICD-10-CM | POA: Insufficient documentation

## 2016-01-28 DIAGNOSIS — I509 Heart failure, unspecified: Secondary | ICD-10-CM | POA: Insufficient documentation

## 2016-01-28 DIAGNOSIS — Z7982 Long term (current) use of aspirin: Secondary | ICD-10-CM | POA: Diagnosis not present

## 2016-01-28 DIAGNOSIS — R1030 Lower abdominal pain, unspecified: Secondary | ICD-10-CM | POA: Insufficient documentation

## 2016-01-28 DIAGNOSIS — M1990 Unspecified osteoarthritis, unspecified site: Secondary | ICD-10-CM | POA: Insufficient documentation

## 2016-01-28 DIAGNOSIS — Z853 Personal history of malignant neoplasm of breast: Secondary | ICD-10-CM | POA: Insufficient documentation

## 2016-01-28 DIAGNOSIS — Z9889 Other specified postprocedural states: Secondary | ICD-10-CM | POA: Diagnosis not present

## 2016-01-28 DIAGNOSIS — Z87891 Personal history of nicotine dependence: Secondary | ICD-10-CM | POA: Diagnosis not present

## 2016-01-28 LAB — URINALYSIS, ROUTINE W REFLEX MICROSCOPIC
BILIRUBIN URINE: NEGATIVE
Glucose, UA: NEGATIVE mg/dL
HGB URINE DIPSTICK: NEGATIVE
KETONES UR: NEGATIVE mg/dL
NITRITE: NEGATIVE
PH: 5.5 (ref 5.0–8.0)
Protein, ur: NEGATIVE mg/dL
Specific Gravity, Urine: 1.019 (ref 1.005–1.030)

## 2016-01-28 LAB — CBC
HEMATOCRIT: 39 % (ref 36.0–46.0)
Hemoglobin: 12.8 g/dL (ref 12.0–15.0)
MCH: 28.8 pg (ref 26.0–34.0)
MCHC: 32.8 g/dL (ref 30.0–36.0)
MCV: 87.6 fL (ref 78.0–100.0)
Platelets: 186 10*3/uL (ref 150–400)
RBC: 4.45 MIL/uL (ref 3.87–5.11)
RDW: 14.8 % (ref 11.5–15.5)
WBC: 5.4 10*3/uL (ref 4.0–10.5)

## 2016-01-28 LAB — COMPREHENSIVE METABOLIC PANEL
ALBUMIN: 3.6 g/dL (ref 3.5–5.0)
ALK PHOS: 83 U/L (ref 38–126)
ALT: 16 U/L (ref 14–54)
AST: 24 U/L (ref 15–41)
Anion gap: 8 (ref 5–15)
BILIRUBIN TOTAL: 0.3 mg/dL (ref 0.3–1.2)
BUN: 10 mg/dL (ref 6–20)
CALCIUM: 9.4 mg/dL (ref 8.9–10.3)
CO2: 24 mmol/L (ref 22–32)
Chloride: 107 mmol/L (ref 101–111)
Creatinine, Ser: 0.92 mg/dL (ref 0.44–1.00)
GFR calc Af Amer: >60 mL/min (ref >60)
GFR calc non Af Amer: >60 mL/min (ref >60)
GLUCOSE: 159 mg/dL — AB (ref 65–99)
Potassium: 4.1 mmol/L (ref 3.5–5.1)
Sodium: 139 mmol/L (ref 135–145)
TOTAL PROTEIN: 6.3 g/dL — AB (ref 6.5–8.1)

## 2016-01-28 LAB — URINE MICROSCOPIC-ADD ON

## 2016-01-28 LAB — LIPASE, BLOOD: Lipase: 30 U/L (ref 11–51)

## 2016-01-28 NOTE — ED Notes (Signed)
Pt. reports intermittent right and low abdominal pain for several weeks , denies nausea or vomitting , no diarrhea or fever .

## 2016-01-29 ENCOUNTER — Emergency Department (HOSPITAL_COMMUNITY)
Admission: EM | Admit: 2016-01-29 | Discharge: 2016-01-29 | Disposition: A | Discharge status: Home / Self Care | Payer: Medicare Other | Attending: Emergency Medicine | Admitting: Emergency Medicine

## 2016-01-29 ENCOUNTER — Emergency Department (HOSPITAL_COMMUNITY): Payer: Medicare Other

## 2016-01-29 DIAGNOSIS — R1011 Right upper quadrant pain: Secondary | ICD-10-CM

## 2016-01-29 MED ORDER — CEPHALEXIN 250 MG PO CAPS
500.0000 mg | ORAL_CAPSULE | Freq: Once | ORAL | Status: DC
  Filled 2016-01-29: qty 2

## 2016-01-29 MED ORDER — CEPHALEXIN 250 MG PO CAPS
ORAL_CAPSULE | ORAL | Status: AC
  Filled 2016-01-29: qty 2

## 2016-01-29 MED ORDER — ACETAMINOPHEN 500 MG PO TABS
1000.0000 mg | ORAL_TABLET | Freq: Once | ORAL | Status: DC
  Filled 2016-01-29: qty 2

## 2016-01-29 MED ORDER — CEPHALEXIN 500 MG PO CAPS: 500.0000 mg | ORAL_CAPSULE | Freq: Two times a day (BID) | ORAL | Status: DC

## 2016-01-29 MED ORDER — HYDROCODONE-ACETAMINOPHEN 5-325 MG PO TABS
ORAL_TABLET | ORAL | Status: AC
  Filled 2016-01-29: qty 1

## 2016-01-29 NOTE — ED Provider Notes (Signed)
CSN: [id number]     Arrival date & time 01/28/16  2027 History  By signing my name below, I, Irene Pap, attest that this documentation has been prepared under the direction and in the presence of Everlene Balls, MD. Electronically Signed: Irene Pap, ED Scribe. 01/29/2016. 12:17 AM.  Chief Complaint  Patient presents with  . Abdominal Pain   The history is provided by a relative. No language interpreter was used.  HPI Comments: Patricia Cuevas is a 65 y.o. female with a hx of CABG, HTN, DM, CAD, coronary stent placement, mastectomy, tobacco and alcohol abuse, MI, CHF, and breast cancer who presents to the Emergency Department complaining of intermittent RUQ and RLQ abdominal pain onset several weeks ago. She reports associated dysuria. Daughter states that pt is due to have gallbladder surgery but has been having a lot of chest pain. She states that the doctors will not do the gallbladder surgery until her cardiologist clears her for surgery. Pt denies worsening or alleviating factors. She denies fever, chills, nausea, vomiting, or diarrhea.  Past Medical History  Diagnosis Date  . Hx of CABG     2007  . Hypertension   . Diabetes mellitus   . Ejection fraction     EF 65%, echo, [place], Jan 16, 2011  . Dyslipidemia   . CAD (coronary artery disease)     Nuclear stress test Feb 11, 2011, EF 64%, no scar or ischemia    //         catheterization, November, 2011,patent LIMA-LAD-small caliber distal vessel with diffuse plaque, patent SVG to OM1 and OM 2, patent SVG to PDA and PLA, occluded SVG to diagonal, medical therapy;  Lexiscan Myoview 6/14:  Inferior thinning, no ischemia, EF 61%, low risk  . Overweight(278.02)     stomach stapling 1985 followed by weight loss, then return of weight  . Tobacco abuse     in the past, resolved  . Alcohol abuse     in the past, resolved  . Nausea & vomiting     hospitalization, November, 2011, stricture GE junction, questionable stenosis  gastrojejunostomy, disruptive primary peristaltic wave, GE reflux, delayed emptying proximal gastric pouch  . Hx of colonic polyps   . Asthma   . Myocardial infarction ([place])   . Sleep apnea     CPAP being arranged May, 2011//does not use CPAP  . Arthritis   . Blood transfusion without reported diagnosis 1984    after surgery  . Hyperlipidemia   . CHF (congestive heart failure) ([place])   . Chronic back pain   . Sciatica   . Normal cardiac stress test 02/2013  . Breast cancer [facility])     Double mastectomy 2007; s/p tamoxifen and arimidex therapy; no recurrence since 05/2008   Past Surgical History  Procedure Laterality Date  . Coronary artery bypass graft  2009  . Mastectomy Bilateral 1987  . Coronary stent placement    . Abdominal hysterectomy  1989  . Gastric bypass  1983    open.  [place]  . Cardiac catheterization N/A 12/20/2015    Procedure: Left Heart Cath and Cors/Grafts Angiography;  Surgeon: Leonie Man, MD;  Location: [facility];  Service: Cardiovascular;  Laterality: N/A;   Family History  Problem Relation Age of Onset  . Hypertension Mother   . Heart disease Mother   . Diabetes Mother   . Heart disease Father   . Colon cancer Father     does not know  age of onset  . Heart disease Sister   . Colon cancer Sister 64  . Stomach cancer Sister   . Asthma Sister   . Esophageal cancer Neg Hx   . Rectal cancer Neg Hx    Social History  Substance Use Topics  . Smoking status: Former Smoker -- 3.00 packs/day for 27 years    Types: Cigarettes    Quit date: 09/14/2000  . Smokeless tobacco: Never Used  . Alcohol Use: No   OB History    No data available     Review of Systems 10 Systems reviewed and all are negative for acute change except as noted in the HPI.  Allergies  Review of patient's allergies indicates no known allergies.  Home Medications   Prior to Admission medications   Medication Sig Start Date End Date Taking? Authorizing Provider   albuterol (PROVENTIL HFA;VENTOLIN HFA) 108 (90 BASE) MCG/ACT inhaler Inhale 2 puffs into the lungs every 6 (six) hours as needed for shortness of breath.     Historical Provider, MD  aspirin EC 81 MG tablet Take 81 mg by mouth every other day.     Historical Provider, MD  atorvastatin (LIPITOR) 40 MG tablet Take 1 tablet (40 mg total) by mouth daily. 07/24/15   [name], MD  Cetirizine HCl 10 MG CAPS Take 1 capsule (10 mg total) by mouth daily. 12/01/14   [name], MD  glipiZIDE (GLUCOTROL XL) 10 MG 24 hr tablet Take 1 tablet (10 mg total) by mouth daily with breakfast. 12/17/15   [name], NP  Hydrocodone-Acetaminophen (VICODIN HP) 10-300 MG TABS Take 1 tablet by mouth 2 (two) times daily as needed (pain).    Historical Provider, MD  isosorbide mononitrate (IMDUR) 30 MG 24 hr tablet Take 1 tablet (30 mg total) by mouth daily. 11/12/14   [name], PA-C  latanoprost (XALATAN) 0.005 % ophthalmic solution Place 1 drop into both eyes at bedtime.    Historical Provider, MD  lisinopril-hydrochlorothiazide (PRINZIDE,ZESTORETIC) 20-12.5 MG per tablet Take 1 tablet by mouth daily.      Historical Provider, MD  metoprolol tartrate (LOPRESSOR) 25 MG tablet Take 1 tablet (25 mg total) by mouth 2 (two) times daily. 12/03/15   [name], NP  nitroGLYCERIN (NITROSTAT) 0.4 MG SL tablet Place 1 tablet (0.4 mg total) under the tongue every 5 (five) minutes as needed for chest pain. 11/12/14   [name], PA-C  ondansetron (ZOFRAN ODT) 4 MG disintegrating tablet 4mg  ODT q4 hours prn nausea/vomit 10/18/15   Merrily Pew, MD  orphenadrine (NORFLEX) 100 MG tablet Take 1 tablet (100 mg total) by mouth 2 (two) times daily. 09/22/15   [name], MD   BP 118/63 mmHg  Pulse 61  Temp(Src) 98.4 F (36.9 C) (Oral)  Resp 16  Ht 4\' 9"  (1.448 m)  Wt 212 lb (96.163 kg)  BMI 45.86 kg/m2  SpO2 97% Physical Exam  Constitutional: She is oriented to person, place, and time. She appears  well-developed and well-nourished. No distress.  HENT:  Head: Normocephalic and atraumatic.  Nose: Nose normal.  Mouth/Throat: Oropharynx is clear and moist. No oropharyngeal exudate.  Eyes: Conjunctivae and EOM are normal. Pupils are equal, round, and reactive to light. No scleral icterus.  Neck: Normal range of motion. Neck supple. No JVD present. No tracheal deviation present. No thyromegaly present.  Cardiovascular: Normal rate, regular rhythm and normal heart sounds.  Exam reveals no gallop and no friction rub.  No murmur heard. Pulmonary/Chest: Effort normal and breath sounds normal. No respiratory distress. She has no wheezes. She exhibits no tenderness.  Abdominal: Soft. Bowel sounds are normal. She exhibits no distension and no mass. There is tenderness. There is no rebound and no guarding.  Supra pubic TTP  Musculoskeletal: Normal range of motion. She exhibits no edema or tenderness.  Lymphadenopathy:    She has no cervical adenopathy.  Neurological: She is alert and oriented to person, place, and time. No cranial nerve deficit. She exhibits normal muscle tone.  Skin: Skin is warm and dry. No rash noted. No erythema. No pallor.  Nursing note and vitals reviewed.   ED Course  Procedures (including critical care time) DIAGNOSTIC STUDIES: Oxygen Saturation is 97% on RA, normal by my interpretation.    COORDINATION OF CARE: 12:20 AM-Discussed treatment plan which includes Tylenol, labs, [place], and antibiotics with pt at bedside and pt agreed to plan.    Labs Review Labs Reviewed  COMPREHENSIVE METABOLIC PANEL - Abnormal; Notable for the following:    Glucose, Bld 159 (*)    Total Protein 6.3 (*)    All other components within normal limits  URINALYSIS, ROUTINE W REFLEX MICROSCOPIC (NOT AT [facility]) - Abnormal; Notable for the following:    APPearance CLOUDY (*)    Leukocytes, UA SMALL (*)    All other components within normal limits  URINE MICROSCOPIC-ADD ON - Abnormal; Notable  for the following:    Squamous Epithelial / LPF 6-30 (*)    Bacteria, UA MANY (*)    All other components within normal limits  LIPASE, BLOOD  CBC    Imaging Review No results found. I have personally reviewed and evaluated these images and lab results as part of my medical decision-making.   EKG Interpretation None      MDM   Final diagnoses:  None   Patient presents to the ED for abdominal pain she thinks may be her gallstones.  Her pain is lower however and UA reveals UTI.  She was given norco, keflex in the ED.  RUQ [place] is negative for gb pathology.  Will DC with keflex rx and pcp fu.  She appears well and in NAD, she was resting in the ED through the night.  Patient is safe for DC.   I personally performed the services described in this documentation, which was scribed in my presence. The recorded information has been reviewed and is accurate.     Everlene Balls, MD 01/29/16 [phone]

## 2016-01-29 NOTE — ED Notes (Signed)
See downtime notes for pt care and discharge

## 2016-01-29 NOTE — Discharge Instructions (Signed)
Urinary Tract Infection Ms. Stailey, take antibiotics for your urine infection and see your primary care doctor within 3 days for close follow up. If symptoms worsen, come back to the ED immediately. Thank you. A urinary tract infection (UTI) can occur any place along the urinary tract. The tract includes the kidneys, ureters, bladder, and urethra. A type of germ called bacteria often causes a UTI. UTIs are often helped with antibiotic medicine.  HOME CARE   If given, take antibiotics as told by your doctor. Finish them even if you start to feel better.  Drink enough fluids to keep your pee (urine) clear or pale yellow.  Avoid tea, drinks with caffeine, and bubbly (carbonated) drinks.  Pee often. Avoid holding your pee in for a long time.  Pee before and after having sex (intercourse).  Wipe from front to back after you poop (bowel movement) if you are a woman. Use each tissue only once. GET HELP RIGHT AWAY IF:   You have back pain.  You have lower belly (abdominal) pain.  You have chills.  You feel sick to your stomach (nauseous).  You throw up (vomit).  Your burning or discomfort with peeing does not go away.  You have a fever.  Your symptoms are not better in 3 days. MAKE SURE YOU:   Understand these instructions.  Will watch your condition.  Will get help right away if you are not doing well or get worse.   This information is not intended to replace advice given to you by your health care provider. Make sure you discuss any questions you have with your health care provider.   Document Released: 02/17/2008 Document Revised: 09/21/2014 Document Reviewed: 03/31/2012 Elsevier Interactive Patient Education [employer].

## 2016-02-04 ENCOUNTER — Encounter: Payer: Self-pay | Admitting: Cardiology

## 2016-02-04 ENCOUNTER — Ambulatory Visit (INDEPENDENT_AMBULATORY_CARE_PROVIDER_SITE_OTHER): Payer: Medicare Other | Admitting: Cardiology

## 2016-02-04 VITALS — BP 110/60 | HR 89 | Ht 59.0 in | Wt 214.8 lb

## 2016-02-04 DIAGNOSIS — Z951 Presence of aortocoronary bypass graft: Secondary | ICD-10-CM

## 2016-02-04 DIAGNOSIS — I25708 Atherosclerosis of coronary artery bypass graft(s), unspecified, with other forms of angina pectoris: Secondary | ICD-10-CM

## 2016-02-04 DIAGNOSIS — I2510 Atherosclerotic heart disease of native coronary artery without angina pectoris: Secondary | ICD-10-CM

## 2016-02-04 DIAGNOSIS — I2583 Coronary atherosclerosis due to lipid rich plaque: Secondary | ICD-10-CM

## 2016-02-04 DIAGNOSIS — I10 Essential (primary) hypertension: Secondary | ICD-10-CM

## 2016-02-04 NOTE — Progress Notes (Signed)
Cardiology Office Note   Date:  02/04/2016   ID:  Patricia Cuevas, DOB 1951/03/20, MRN [id number]  PCP:  [name], MD  Cardiologist:   Candee Furbish, MD       History of Present Illness: Patricia Cuevas is a 65 y.o. female former patient of Dr. Ron Parker here for follow-up appointment.  Has coronary disease post bypass surgery in 2007 with catheterization in 2011 and 2017 demonstrating patent LIMA to LAD, small caliber vessel distally diffuse plaque with patent SVG to OM1 and OM 2, patent SVG to PDA and PLA, occluded SVG to diagonal. Last cath in 2017 secondary to abnormal NUC stress.   She seeks the majority of her care in the emergency department.  Went to ER 01/29/2016 - abdominal pain, ruq [place] normal.   Multiple episodes of atypical chest pain. No CP, no SOB, no edema.   Compliant with meds.  Normal EF.   Past Medical History  Diagnosis Date  . Hx of CABG     2007  . Hypertension   . Diabetes mellitus   . Ejection fraction     EF 65%, echo, [place], Jan 16, 2011  . Dyslipidemia   . CAD (coronary artery disease)     Nuclear stress test Feb 11, 2011, EF 64%, no scar or ischemia    //         catheterization, November, 2011,patent LIMA-LAD-small caliber distal vessel with diffuse plaque, patent SVG to OM1 and OM 2, patent SVG to PDA and PLA, occluded SVG to diagonal, medical therapy;  Lexiscan Myoview 6/14:  Inferior thinning, no ischemia, EF 61%, low risk  . Overweight(278.02)     stomach stapling 1985 followed by weight loss, then return of weight  . Tobacco abuse     in the past, resolved  . Alcohol abuse     in the past, resolved  . Nausea & vomiting     hospitalization, November, 2011, stricture GE junction, questionable stenosis gastrojejunostomy, disruptive primary peristaltic wave, GE reflux, delayed emptying proximal gastric pouch  . Hx of colonic polyps   . Asthma   . Myocardial infarction ([place])   . Sleep apnea     CPAP being arranged May, 2011//does  not use CPAP  . Arthritis   . Blood transfusion without reported diagnosis 1984    after surgery  . Hyperlipidemia   . CHF (congestive heart failure) ([place])   . Chronic back pain   . Sciatica   . Normal cardiac stress test 02/2013  . Breast cancer [facility])     Double mastectomy 2007; s/p tamoxifen and arimidex therapy; no recurrence since 05/2008    Past Surgical History  Procedure Laterality Date  . Coronary artery bypass graft  2009  . Mastectomy Bilateral 1987  . Coronary stent placement    . Abdominal hysterectomy  1989  . Gastric bypass  1983    open.  [place]  . Cardiac catheterization N/A 12/20/2015    Procedure: Left Heart Cath and Cors/Grafts Angiography;  Surgeon: Leonie Man, MD;  Location: [facility];  Service: Cardiovascular;  Laterality: N/A;     Current Outpatient Prescriptions  Medication Sig Dispense Refill  . albuterol (PROVENTIL HFA;VENTOLIN HFA) 108 (90 BASE) MCG/ACT inhaler Inhale 2 puffs into the lungs every 6 (six) hours as needed for shortness of breath.     [name] Kitchen aspirin EC 81 MG tablet Take 81 mg by mouth every other day.     [name] Kitchen  atorvastatin (LIPITOR) 40 MG tablet Take 1 tablet (40 mg total) by mouth daily. 30 tablet 0  . Cetirizine HCl 10 MG CAPS Take 1 capsule (10 mg total) by mouth daily. 30 capsule 0  . glipiZIDE (GLUCOTROL XL) 10 MG 24 hr tablet Take 1 tablet (10 mg total) by mouth daily with breakfast. 30 tablet 9  . Hydrocodone-Acetaminophen (VICODIN HP) 10-300 MG TABS Take 1 tablet by mouth 2 (two) times daily as needed (pain).    . isosorbide mononitrate (IMDUR) 30 MG 24 hr tablet Take 1 tablet (30 mg total) by mouth daily. 30 tablet 6  . latanoprost (XALATAN) 0.005 % ophthalmic solution Place 1 drop into both eyes at bedtime.    [name] Kitchen lisinopril-hydrochlorothiazide (PRINZIDE,ZESTORETIC) 20-12.5 MG per tablet Take 1 tablet by mouth daily.      . metoprolol tartrate (LOPRESSOR) 25 MG tablet Take 1 tablet (25 mg total) by mouth 2 (two) times daily.  60 tablet 6  . nitroGLYCERIN (NITROSTAT) 0.4 MG SL tablet Place 1 tablet (0.4 mg total) under the tongue every 5 (five) minutes as needed for chest pain. 25 tablet 3  . ondansetron (ZOFRAN) 4 MG tablet Take 4 mg by mouth every 4 (four) hours as needed for nausea or vomiting.    . orphenadrine (NORFLEX) 100 MG tablet Take 1 tablet (100 mg total) by mouth 2 (two) times daily. 30 tablet 0   No current facility-administered medications for this visit.    Allergies:   Review of patient's allergies indicates no known allergies.    Social History:  The patient  reports that she quit smoking about 15 years ago. Her smoking use included Cigarettes. She has a 81 pack-year smoking history. She has never used smokeless tobacco. She reports that she does not drink alcohol or use illicit drugs.   Family History:  The patient's family history includes Asthma in her sister; Colon cancer in her father; Colon cancer (age of onset: 29) in her sister; Diabetes in her mother; Heart disease in her father, mother, and sister; Hypertension in her mother; Stomach cancer in her sister. There is no history of Esophageal cancer or Rectal cancer.    ROS:  Please see the history of present illness.   Otherwise, review of systems are positive for none.   All other systems are reviewed and negative.    PHYSICAL EXAM: VS:  BP 110/60 mmHg  Pulse 89  Ht 4\' 11"  (1.499 m)  Wt 214 lb 12.8 oz (97.433 kg)  BMI 43.36 kg/m2  SpO2 93% , BMI Body mass index is 43.36 kg/(m^2). GEN: Well nourished, well developed, in no acute distress HEENT: normal Neck: no JVD, carotid bruits, or masses Cardiac: RRR; no murmurs, rubs, or gallops,no edema  Respiratory:  clear to auscultation bilaterally, normal work of breathing GI: soft, nontender, nondistended, + BS, overweight MS: no deformity or atrophy Skin: warm and dry, no rash Neuro:  Strength and sensation are intact Psych: euthymic mood, full affect   EKG:  None today   Recent  Labs: 12/14/2015: B Natriuretic Peptide 16.2 01/28/2016: ALT 16; BUN 10; Creatinine, Ser 0.92; Hemoglobin 12.8; Platelets 186; Potassium 4.1; Sodium 139    Lipid Panel    Component Value Date/Time   CHOL  05/17/2009 0130    139        ATP III CLASSIFICATION:  <200     mg/dL   Desirable  200-239  mg/dL   Borderline High  >=240    mg/dL   High  TRIG 93 05/17/2009 0130   HDL 41 05/17/2009 0130   CHOLHDL 3.4 05/17/2009 0130   VLDL 19 05/17/2009 0130   LDLCALC  05/17/2009 0130    79        Total Cholesterol/HDL:CHD Risk Coronary Heart Disease Risk Table                     Men   Women  1/2 Average Risk   3.4   3.3  Average Risk       5.0   4.4  2 X Average Risk   9.6   7.1  3 X Average Risk  23.4   11.0        Use the calculated Patient Ratio above and the CHD Risk Table to determine the patient's CHD Risk.        ATP III CLASSIFICATION (LDL):  <100     mg/dL   Optimal  100-129  mg/dL   Near or Above                    Optimal  130-159  mg/dL   Borderline  160-189  mg/dL   High  >190     mg/dL   Very High      Wt Readings from Last 3 Encounters:  02/04/16 214 lb 12.8 oz (97.433 kg)  01/28/16 212 lb (96.163 kg)  12/20/15 215 lb (97.523 kg)      Other studies Reviewed: Additional studies/ records that were reviewed today include: Prior medical records reviewed, lab work reviewed. Review of the above records demonstrates: as above  Cath 12/20/15:  Prox RCA to Mid RCA lesion, 55% stenosed. Mid RCA to Dist RCA lesion, 100% stenosed. Mild disease of the Ostial-Prox SVG-dRCA(filling PLA & PDA).  Mid LAD lesion, 100% stenosed. LIMA-LAD is patent, but very small to a small distal LAD. Very tortuous graft - not a good option to attempt PCI through.  Prox Cx to Mid Cx lesion, 100% stenosed. The lesion was previously treated with a stent (unknown type) greater than two years ago. Mid Cx lesion, 100% stenosed. Patent SVG-OM2 & OM3.  Origin to Prox SVG-Diag Graft lesion,  100% stenosed. Known.  Sequential SVG-OM2-OM3 was injected is large & widely patent.  The left ventricular systolic function is normal.   No real change in anatomy from 2011 catheterization. No culprit lesion to explain the patient's stress test.  Normal EF with normal EDP and stable coronary arteries.  ASSESSMENT AND PLAN:  Coronary artery disease -Cath 12/20/15 reassuring, no ischemia, normal EF -Aspirin 81.  History of CABG bypass surgery 2007 -Cardiac catheterization as described above -Reassuring.  Hyperlipidemia -Goal-directed therapy, no changes.  Obesity -Encourage weight loss -Gastric bypass (was 500 lbs) - Has some residual abdominal discomfort at scar site she thinks. No evidence of cholecystitis, normal gallbladder ultrasound previously.  Essential hypertension -Medications reviewed, no significant changes.  Angina -Well-controlled with Metoprolol. -no NTG.   Former smoker -quit 10 years ago   Current medicines are reviewed at length with the patient today.  The patient does not have concerns regarding medicines.  The following changes have been made:  no change  Labs/ tests ordered today include: No orders of the defined types were placed in this encounter.     Disposition:   FU with Idris Edmundson in 6 months  Signed, Candee Furbish, MD  02/04/2016 8:17 AM    [facility] [place], [place], [place]  [address] Phone: [phone]; Fax: [phone]

## 2016-02-04 NOTE — Patient Instructions (Signed)
Medication Instructions:  Your physician recommends that you continue on your current medications as directed. Please refer to the Current Medication list given to you today.  Labwork: None ordered  Testing/Procedures: None ordered  Follow-Up: Your physician wants you to follow-up in: 1 YEAR WITH DR. SKAINS   You will receive a reminder letter in the mail two months in advance. If you don't receive a letter, please call our office to schedule the follow-up appointment.    Any Other Special Instructions Will Be Listed Below (If Applicable).    If you need a refill on your cardiac medications before your next appointment, please call your pharmacy.   

## 2016-02-17 ENCOUNTER — Emergency Department (HOSPITAL_COMMUNITY)
Admission: EM | Admit: 2016-02-17 | Discharge: 2016-02-17 | Disposition: A | Discharge status: Home / Self Care | Payer: Medicare Other | Attending: Emergency Medicine | Admitting: Emergency Medicine

## 2016-02-17 ENCOUNTER — Encounter (HOSPITAL_COMMUNITY): Payer: Self-pay | Admitting: Nurse Practitioner

## 2016-02-17 DIAGNOSIS — E663 Overweight: Secondary | ICD-10-CM | POA: Insufficient documentation

## 2016-02-17 DIAGNOSIS — N390 Urinary tract infection, site not specified: Secondary | ICD-10-CM | POA: Diagnosis not present

## 2016-02-17 DIAGNOSIS — E785 Hyperlipidemia, unspecified: Secondary | ICD-10-CM | POA: Insufficient documentation

## 2016-02-17 DIAGNOSIS — Z86010 Personal history of colonic polyps: Secondary | ICD-10-CM | POA: Diagnosis not present

## 2016-02-17 DIAGNOSIS — Z951 Presence of aortocoronary bypass graft: Secondary | ICD-10-CM | POA: Insufficient documentation

## 2016-02-17 DIAGNOSIS — Z853 Personal history of malignant neoplasm of breast: Secondary | ICD-10-CM | POA: Diagnosis not present

## 2016-02-17 DIAGNOSIS — Z7982 Long term (current) use of aspirin: Secondary | ICD-10-CM | POA: Diagnosis not present

## 2016-02-17 DIAGNOSIS — R1030 Lower abdominal pain, unspecified: Secondary | ICD-10-CM

## 2016-02-17 DIAGNOSIS — J45909 Unspecified asthma, uncomplicated: Secondary | ICD-10-CM | POA: Diagnosis not present

## 2016-02-17 DIAGNOSIS — Z79899 Other long term (current) drug therapy: Secondary | ICD-10-CM | POA: Insufficient documentation

## 2016-02-17 DIAGNOSIS — I252 Old myocardial infarction: Secondary | ICD-10-CM | POA: Insufficient documentation

## 2016-02-17 DIAGNOSIS — I2510 Atherosclerotic heart disease of native coronary artery without angina pectoris: Secondary | ICD-10-CM | POA: Insufficient documentation

## 2016-02-17 DIAGNOSIS — E119 Type 2 diabetes mellitus without complications: Secondary | ICD-10-CM | POA: Insufficient documentation

## 2016-02-17 DIAGNOSIS — M1990 Unspecified osteoarthritis, unspecified site: Secondary | ICD-10-CM | POA: Diagnosis not present

## 2016-02-17 DIAGNOSIS — Z87891 Personal history of nicotine dependence: Secondary | ICD-10-CM | POA: Insufficient documentation

## 2016-02-17 DIAGNOSIS — G8929 Other chronic pain: Secondary | ICD-10-CM | POA: Insufficient documentation

## 2016-02-17 DIAGNOSIS — I509 Heart failure, unspecified: Secondary | ICD-10-CM | POA: Insufficient documentation

## 2016-02-17 DIAGNOSIS — Z792 Long term (current) use of antibiotics: Secondary | ICD-10-CM | POA: Diagnosis not present

## 2016-02-17 DIAGNOSIS — I10 Essential (primary) hypertension: Secondary | ICD-10-CM | POA: Diagnosis not present

## 2016-02-17 LAB — COMPREHENSIVE METABOLIC PANEL
ALK PHOS: 72 U/L (ref 38–126)
ALT: 18 U/L (ref 14–54)
AST: 22 U/L (ref 15–41)
Albumin: 3.7 g/dL (ref 3.5–5.0)
Anion gap: 6 (ref 5–15)
BUN: 11 mg/dL (ref 6–20)
CHLORIDE: 110 mmol/L (ref 101–111)
CO2: 24 mmol/L (ref 22–32)
CREATININE: 0.77 mg/dL (ref 0.44–1.00)
Calcium: 9.4 mg/dL (ref 8.9–10.3)
GFR calc Af Amer: >60 mL/min (ref >60)
Glucose, Bld: 143 mg/dL — ABNORMAL HIGH (ref 65–99)
Potassium: 4.1 mmol/L (ref 3.5–5.1)
Sodium: 140 mmol/L (ref 135–145)
Total Bilirubin: 0.5 mg/dL (ref 0.3–1.2)
Total Protein: 6.8 g/dL (ref 6.5–8.1)

## 2016-02-17 LAB — CBC
HEMATOCRIT: 41 % (ref 36.0–46.0)
HEMOGLOBIN: 12.8 g/dL (ref 12.0–15.0)
MCH: 27.2 pg (ref 26.0–34.0)
MCHC: 31.2 g/dL (ref 30.0–36.0)
MCV: 87 fL (ref 78.0–100.0)
Platelets: 198 10*3/uL (ref 150–400)
RBC: 4.71 MIL/uL (ref 3.87–5.11)
RDW: 14.6 % (ref 11.5–15.5)
WBC: 5.1 10*3/uL (ref 4.0–10.5)

## 2016-02-17 LAB — URINALYSIS, ROUTINE W REFLEX MICROSCOPIC
Bilirubin Urine: NEGATIVE
GLUCOSE, UA: NEGATIVE mg/dL
HGB URINE DIPSTICK: NEGATIVE
Ketones, ur: NEGATIVE mg/dL
Nitrite: NEGATIVE
PH: 6.5 (ref 5.0–8.0)
Protein, ur: NEGATIVE mg/dL
SPECIFIC GRAVITY, URINE: 1.024 (ref 1.005–1.030)

## 2016-02-17 LAB — URINE MICROSCOPIC-ADD ON: RBC / HPF: NONE SEEN RBC/hpf (ref 0–5)

## 2016-02-17 MED ORDER — CEPHALEXIN 500 MG PO CAPS: 500.0000 mg | ORAL_CAPSULE | Freq: Two times a day (BID) | ORAL | Status: DC

## 2016-02-17 NOTE — ED Provider Notes (Signed)
CSN: [id number]     Arrival date & time 02/17/16  1220 History   First MD Initiated Contact with Patient 02/17/16 1632     Chief Complaint  Patient presents with  . Abdominal Pain     (Consider location/radiation/quality/duration/timing/severity/associated sxs/prior Treatment) HPI Comments: Pt comes in with cc of abd pain. She has hx of DM, HTN, CAD. PT reports that she had a UTI earlier in the month, she was started on antibiotics, her symptoms improved only to get worse again yday. Pt has burning with urination and lower quadrant abd pain. She has no n/v/f/c. No back pain. No hx of renal stones.   ROS 10 Systems reviewed and are negative for acute change except as noted in the HPI.     Patient is a 65 y.o. female presenting with abdominal pain. The history is provided by the patient.  Abdominal Pain   Past Medical History  Diagnosis Date  . Hx of CABG     2007  . Hypertension   . Diabetes mellitus   . Ejection fraction     EF 65%, echo, [place], Jan 16, 2011  . Dyslipidemia   . CAD (coronary artery disease)     Nuclear stress test Feb 11, 2011, EF 64%, no scar or ischemia    //         catheterization, November, 2011,patent LIMA-LAD-small caliber distal vessel with diffuse plaque, patent SVG to OM1 and OM 2, patent SVG to PDA and PLA, occluded SVG to diagonal, medical therapy;  Lexiscan Myoview 6/14:  Inferior thinning, no ischemia, EF 61%, low risk  . Overweight(278.02)     stomach stapling 1985 followed by weight loss, then return of weight  . Tobacco abuse     in the past, resolved  . Alcohol abuse     in the past, resolved  . Nausea & vomiting     hospitalization, November, 2011, stricture GE junction, questionable stenosis gastrojejunostomy, disruptive primary peristaltic wave, GE reflux, delayed emptying proximal gastric pouch  . Hx of colonic polyps   . Asthma   . Myocardial infarction ([place])   . Sleep apnea     CPAP being arranged May, 2011//does not use CPAP   . Arthritis   . Blood transfusion without reported diagnosis 1984    after surgery  . Hyperlipidemia   . CHF (congestive heart failure) ([place])   . Chronic back pain   . Sciatica   . Normal cardiac stress test 02/2013  . Breast cancer [facility])     Double mastectomy 2007; s/p tamoxifen and arimidex therapy; no recurrence since 05/2008   Past Surgical History  Procedure Laterality Date  . Coronary artery bypass graft  2009  . Mastectomy Bilateral 1987  . Coronary stent placement    . Abdominal hysterectomy  1989  . Gastric bypass  1983    open.  [place]  . Cardiac catheterization N/A 12/20/2015    Procedure: Left Heart Cath and Cors/Grafts Angiography;  Surgeon: Leonie Man, MD;  Location: [facility];  Service: Cardiovascular;  Laterality: N/A;   Family History  Problem Relation Age of Onset  . Hypertension Mother   . Heart disease Mother   . Diabetes Mother   . Heart disease Father   . Colon cancer Father     does not know age of onset  . Heart disease Sister   . Colon cancer Sister 67  . Stomach cancer Sister   . Asthma Sister   .  Esophageal cancer Neg Hx   . Rectal cancer Neg Hx    Social History  Substance Use Topics  . Smoking status: Former Smoker -- 3.00 packs/day for 27 years    Types: Cigarettes    Quit date: 09/14/2000  . Smokeless tobacco: Never Used  . Alcohol Use: No   OB History    No data available     Review of Systems  Gastrointestinal: Positive for abdominal pain.      Allergies  Review of patient's allergies indicates no known allergies.  Home Medications   Prior to Admission medications   Medication Sig Start Date End Date Taking? Authorizing Provider  albuterol (PROVENTIL HFA;VENTOLIN HFA) 108 (90 BASE) MCG/ACT inhaler Inhale 2 puffs into the lungs every 6 (six) hours as needed for shortness of breath.     Historical Provider, MD  aspirin EC 81 MG tablet Take 81 mg by mouth every other day.     Historical Provider, MD   atorvastatin (LIPITOR) 40 MG tablet Take 1 tablet (40 mg total) by mouth daily. 07/24/15   [name], MD  cephALEXin (KEFLEX) 500 MG capsule Take 1 capsule (500 mg total) by mouth 2 (two) times daily. 02/17/16   [name], MD  Cetirizine HCl 10 MG CAPS Take 1 capsule (10 mg total) by mouth daily. 12/01/14   [name], MD  glipiZIDE (GLUCOTROL XL) 10 MG 24 hr tablet Take 1 tablet (10 mg total) by mouth daily with breakfast. 12/17/15   [name], NP  Hydrocodone-Acetaminophen (VICODIN HP) 10-300 MG TABS Take 1 tablet by mouth 2 (two) times daily as needed (pain).    Historical Provider, MD  isosorbide mononitrate (IMDUR) 30 MG 24 hr tablet Take 1 tablet (30 mg total) by mouth daily. 11/12/14   [name], PA-C  latanoprost (XALATAN) 0.005 % ophthalmic solution Place 1 drop into both eyes at bedtime.    Historical Provider, MD  lisinopril-hydrochlorothiazide (PRINZIDE,ZESTORETIC) 20-12.5 MG per tablet Take 1 tablet by mouth daily.      Historical Provider, MD  metoprolol tartrate (LOPRESSOR) 25 MG tablet Take 1 tablet (25 mg total) by mouth 2 (two) times daily. 12/03/15   [name], NP  nitroGLYCERIN (NITROSTAT) 0.4 MG SL tablet Place 1 tablet (0.4 mg total) under the tongue every 5 (five) minutes as needed for chest pain. 11/12/14   [name], PA-C  ondansetron (ZOFRAN) 4 MG tablet Take 4 mg by mouth every 4 (four) hours as needed for nausea or vomiting.    Historical Provider, MD  orphenadrine (NORFLEX) 100 MG tablet Take 1 tablet (100 mg total) by mouth 2 (two) times daily. 09/22/15   [name], MD   BP 127/62 mmHg  Pulse 77  Temp(Src) 97.8 F (36.6 C) (Oral)  Resp 18  Ht 4\' 11"  (1.499 m)  Wt 212 lb (96.163 kg)  BMI 42.80 kg/m2  SpO2 100% Physical Exam  Constitutional: She is oriented to person, place, and time. She appears well-developed.  HENT:  Head: Normocephalic and atraumatic.  Eyes: EOM are normal.  Neck: Normal range of motion. Neck supple.   Cardiovascular: Normal rate.   Pulmonary/Chest: Effort normal.  Abdominal: Bowel sounds are normal. There is tenderness. There is no rebound and no guarding.  Suprapubic tenderness in the lower quadrant  Neurological: She is alert and oriented to person, place, and time.  Skin: Skin is warm and dry.  Nursing note and vitals reviewed.   ED Course  Procedures (including critical  care time) Labs Review Labs Reviewed  COMPREHENSIVE METABOLIC PANEL - Abnormal; Notable for the following:    Glucose, Bld 143 (*)    All other components within normal limits  URINALYSIS, ROUTINE W REFLEX MICROSCOPIC (NOT AT [facility]) - Abnormal; Notable for the following:    APPearance HAZY (*)    Leukocytes, UA MODERATE (*)    All other components within normal limits  URINE MICROSCOPIC-ADD ON - Abnormal; Notable for the following:    Squamous Epithelial / LPF 6-30 (*)    Bacteria, UA FEW (*)    All other components within normal limits  URINE CULTURE  CBC    Imaging Review No results found. I have personally reviewed and evaluated these images and lab results as part of my medical decision-making.   EKG Interpretation None      MDM   Final diagnoses:  Lower abdominal pain  UTI (lower urinary tract infection)    Pt comes in with cc of abd pain. Abd pain is in the suprapubic region. She has dysuria. PT is diabetic. Abd exam is non peritoneal. CT scan from Feb reviewed - no diverticular disease. Plan is to get UA and reassess.  6:04 PM UA is equivocal. Cultures sent.  Repeat exam shows no peritoneal signs. The labs and vitals are reassuring. Repeat CT not indicated. Results discussed, and pt is comfortable with the plan of conservative approach. She already has an appt with her pcp this week. Strict ER return precautions have been discussed, and patient is agreeing with the plan and is comfortable with the workup done and the recommendations from the ER.     [name], MD 02/17/16  [id number]

## 2016-02-17 NOTE — Discharge Instructions (Signed)
We think you have  A URINARY INFECTION - but the results are not completely convincing of infection either. Therefore, after discussing all results with you, we have decided to start you on antibiotics, and have you see your doctor for further care.  Please return to the ER if your symptoms worsen; you have increased pain, fevers, chills, inability to keep any medications down, bloody stools. Otherwise see the outpatient doctor as requested.   Abdominal Pain, Adult Many things can cause abdominal pain. Usually, abdominal pain is not caused by a disease and will improve without treatment. It can often be observed and treated at home. Your health care provider will do a physical exam and possibly order blood tests and X-rays to help determine the seriousness of your pain. However, in many cases, more time must pass before a clear cause of the pain can be found. Before that point, your health care provider may not know if you need more testing or further treatment. HOME CARE INSTRUCTIONS Monitor your abdominal pain for any changes. The following actions may help to alleviate any discomfort you are experiencing:  Only take over-the-counter or prescription medicines as directed by your health care provider.  Do not take laxatives unless directed to do so by your health care provider.  Try a clear liquid diet (broth, tea, or water) as directed by your health care provider. Slowly move to a bland diet as tolerated. SEEK MEDICAL CARE IF:  You have unexplained abdominal pain.  You have abdominal pain associated with nausea or diarrhea.  You have pain when you urinate or have a bowel movement.  You experience abdominal pain that wakes you in the night.  You have abdominal pain that is worsened or improved by eating food.  You have abdominal pain that is worsened with eating fatty foods.  You have a fever. SEEK IMMEDIATE MEDICAL CARE IF:  Your pain does not go away within 2 hours.  You keep  throwing up (vomiting).  Your pain is felt only in portions of the abdomen, such as the right side or the left lower portion of the abdomen.  You pass bloody or black tarry stools. MAKE SURE YOU:  Understand these instructions.  Will watch your condition.  Will get help right away if you are not doing well or get worse.   This information is not intended to replace advice given to you by your health care provider. Make sure you discuss any questions you have with your health care provider.   Document Released: 06/10/2005 Document Revised: 05/22/2015 Document Reviewed: 05/10/2013 Elsevier Interactive Patient Education 2016 Elsevier Inc[employer]  Dysuria Dysuria is pain or discomfort while urinating. The pain or discomfort may be felt in the tube that carries urine out of the bladder (urethra) or in the surrounding tissue of the genitals. The pain may also be felt in the groin area, lower abdomen, and lower back. You may have to urinate frequently or have the sudden feeling that you have to urinate (urgency). Dysuria can affect both men and women, but is more common in women. Dysuria can be caused by many different things, including:  Urinary tract infection in women.  Infection of the kidney or bladder.  Kidney stones or bladder stones.  Certain sexually transmitted infections (STIs), such as chlamydia.  Dehydration.  Inflammation of the vagina.  Use of certain medicines.  Use of certain soaps or scented products that cause irritation. HOME CARE INSTRUCTIONS Watch your dysuria for any changes. The following actions may  help to reduce any discomfort you are feeling:  Drink enough fluid to keep your urine clear or pale yellow.  Empty your bladder often. Avoid holding urine for long periods of time.  After a bowel movement or urination, women should cleanse from front to back, using each tissue only once.  Empty your bladder after sexual intercourse.  Take medicines only as  directed by your health care provider.  If you were prescribed an antibiotic medicine, finish it all even if you start to feel better.  Avoid caffeine, tea, and alcohol. They can irritate the bladder and make dysuria worse. In men, alcohol may irritate the prostate.  Keep all follow-up visits as directed by your health care provider. This is important.  If you had any tests done to find the cause of dysuria, it is your responsibility to obtain your test results. Ask the lab or department performing the test when and how you will get your results. Talk with your health care provider if you have any questions about your results. SEEK MEDICAL CARE IF:  You develop pain in your back or sides.  You have a fever.  You have nausea or vomiting.  You have blood in your urine.  You are not urinating as often as you usually do. SEEK IMMEDIATE MEDICAL CARE IF:  You pain is severe and not relieved with medicines.  You are unable to hold down any fluids.  You or someone else notices a change in your mental function.  You have a rapid heartbeat at rest.  You have shaking or chills.  You feel extremely weak.   This information is not intended to replace advice given to you by your health care provider. Make sure you discuss any questions you have with your health care provider.   Document Released: 05/29/2004 Document Revised: 09/21/2014 Document Reviewed: 04/26/2014 Elsevier Interactive Patient Education [employer].

## 2016-02-17 NOTE — ED Notes (Signed)
Pt c/o 1 day history of abd pain, dysuria, difficulty voiding. She denies n/v, fevers, bowel changes. She is alert and breathing easily

## 2016-02-20 LAB — URINE CULTURE

## 2016-02-21 ENCOUNTER — Telehealth: Payer: Self-pay | Admitting: *Deleted

## 2016-02-21 NOTE — ED Notes (Signed)
Post ED Visit - Positive Culture Follow-up: Successful Patient Follow-Up  Culture assessed and recommendations reviewed by: []  Elenor Quinones, Pharm.D. []  [name], Pharm.D., BCPS []  Parks Neptune, Pharm.D. []  Alycia Rossetti, Pharm.D., BCPS []  Marlette, Pharm.D., BCPS, AAHIVP []  Legrand Como, Pharm.D., BCPS, AAHIVP [x]  Milus Glazier, Pharm.D. []  Rob Evette Doffing, Pharm.D.  Positive urine culture  []  Patient discharged without antimicrobial prescription and treatment is now indicated [x]  Organism is resistant to prescribed ED discharge antimicrobial []  Patient with positive blood cultures  Changes discussed with ED provider [name], PA New antibiotic prescription Fosfomycin 3gram PO x 1 dose Called to [facility], [place], [place] Contacted patient, date 02/21/2016, time  [place], [place] 02/21/2016, 1:31 PM

## 2016-02-21 NOTE — Progress Notes (Signed)
ED Antimicrobial Stewardship Positive Culture Follow Up   Patricia Cuevas is an 65 y.o. female who presented to [facility] on 02/17/2016 with a chief complaint of  Chief Complaint  Patient presents with  . Abdominal Pain    Recent Results (from the past 720 hour(s))  Urine culture     Status: Abnormal   Collection Time: 02/17/16  4:59 PM  Result Value Ref Range Status   Specimen Description URINE, CLEAN CATCH  Final   Special Requests NONE  Final   Culture (A)  Final    >=100,000 COLONIES/mL ESCHERICHIA COLI Confirmed Extended Spectrum Beta-Lactamase Producer (ESBL)    Report Status 02/20/2016 FINAL  Final   Organism ID, Bacteria ESCHERICHIA COLI (A)  Final      Susceptibility   Escherichia coli - MIC*    AMPICILLIN >=32 RESISTANT Resistant     CEFAZOLIN >=64 RESISTANT Resistant     CEFTRIAXONE >=64 RESISTANT Resistant     CIPROFLOXACIN >=4 RESISTANT Resistant     GENTAMICIN <=1 SENSITIVE Sensitive     IMIPENEM <=0.25 SENSITIVE Sensitive     NITROFURANTOIN <=16 SENSITIVE Sensitive     TRIMETH/SULFA >=320 RESISTANT Resistant     AMPICILLIN/SULBACTAM >=32 RESISTANT Resistant     PIP/TAZO 16 SENSITIVE Sensitive     * >=100,000 COLONIES/mL ESCHERICHIA COLI     [x]  Treated with cephlexin, organism resistant to prescribed antimicrobial  New antibiotic prescription: Stop cephalexin. Fosfomycin 3 gm x 1.  ED Provider: [name], PA-C  Geniene List L. Nicole Kindred, PharmD PGY2 Infectious Diseases Pharmacy Resident Pager: [phone] 02/21/2016 10:27 AM

## 2016-03-02 ENCOUNTER — Emergency Department (HOSPITAL_COMMUNITY): Payer: Medicare Other

## 2016-03-02 ENCOUNTER — Emergency Department (HOSPITAL_COMMUNITY)
Admission: EM | Admit: 2016-03-02 | Discharge: 2016-03-02 | Disposition: A | Discharge status: Home / Self Care | Payer: Medicare Other | Attending: Emergency Medicine | Admitting: Emergency Medicine

## 2016-03-02 ENCOUNTER — Encounter (HOSPITAL_COMMUNITY): Payer: Self-pay

## 2016-03-02 DIAGNOSIS — Z87891 Personal history of nicotine dependence: Secondary | ICD-10-CM | POA: Insufficient documentation

## 2016-03-02 DIAGNOSIS — Z79899 Other long term (current) drug therapy: Secondary | ICD-10-CM | POA: Insufficient documentation

## 2016-03-02 DIAGNOSIS — Z955 Presence of coronary angioplasty implant and graft: Secondary | ICD-10-CM | POA: Diagnosis not present

## 2016-03-02 DIAGNOSIS — I252 Old myocardial infarction: Secondary | ICD-10-CM | POA: Diagnosis not present

## 2016-03-02 DIAGNOSIS — Z7984 Long term (current) use of oral hypoglycemic drugs: Secondary | ICD-10-CM | POA: Diagnosis not present

## 2016-03-02 DIAGNOSIS — Z7982 Long term (current) use of aspirin: Secondary | ICD-10-CM | POA: Diagnosis not present

## 2016-03-02 DIAGNOSIS — E119 Type 2 diabetes mellitus without complications: Secondary | ICD-10-CM | POA: Diagnosis not present

## 2016-03-02 DIAGNOSIS — R1030 Lower abdominal pain, unspecified: Secondary | ICD-10-CM | POA: Insufficient documentation

## 2016-03-02 DIAGNOSIS — J45909 Unspecified asthma, uncomplicated: Secondary | ICD-10-CM | POA: Diagnosis not present

## 2016-03-02 DIAGNOSIS — I110 Hypertensive heart disease with heart failure: Secondary | ICD-10-CM | POA: Insufficient documentation

## 2016-03-02 DIAGNOSIS — Z951 Presence of aortocoronary bypass graft: Secondary | ICD-10-CM | POA: Insufficient documentation

## 2016-03-02 DIAGNOSIS — Z91040 Latex allergy status: Secondary | ICD-10-CM | POA: Diagnosis not present

## 2016-03-02 DIAGNOSIS — M25561 Pain in right knee: Secondary | ICD-10-CM | POA: Diagnosis present

## 2016-03-02 DIAGNOSIS — M1711 Unilateral primary osteoarthritis, right knee: Secondary | ICD-10-CM | POA: Insufficient documentation

## 2016-03-02 DIAGNOSIS — I2510 Atherosclerotic heart disease of native coronary artery without angina pectoris: Secondary | ICD-10-CM | POA: Insufficient documentation

## 2016-03-02 DIAGNOSIS — N390 Urinary tract infection, site not specified: Secondary | ICD-10-CM | POA: Diagnosis not present

## 2016-03-02 DIAGNOSIS — Z853 Personal history of malignant neoplasm of breast: Secondary | ICD-10-CM | POA: Diagnosis not present

## 2016-03-02 DIAGNOSIS — E785 Hyperlipidemia, unspecified: Secondary | ICD-10-CM | POA: Diagnosis not present

## 2016-03-02 DIAGNOSIS — I509 Heart failure, unspecified: Secondary | ICD-10-CM | POA: Diagnosis not present

## 2016-03-02 LAB — CBC
HCT: 39.2 % (ref 36.0–46.0)
HEMOGLOBIN: 12.3 g/dL (ref 12.0–15.0)
MCH: 27.5 pg (ref 26.0–34.0)
MCHC: 31.4 g/dL (ref 30.0–36.0)
MCV: 87.7 fL (ref 78.0–100.0)
PLATELETS: 190 10*3/uL (ref 150–400)
RBC: 4.47 MIL/uL (ref 3.87–5.11)
RDW: 14.6 % (ref 11.5–15.5)
WBC: 5.5 10*3/uL (ref 4.0–10.5)

## 2016-03-02 LAB — COMPREHENSIVE METABOLIC PANEL
ALT: 17 U/L (ref 14–54)
ANION GAP: 6 (ref 5–15)
AST: 24 U/L (ref 15–41)
Albumin: 3.6 g/dL (ref 3.5–5.0)
Alkaline Phosphatase: 73 U/L (ref 38–126)
BUN: 8 mg/dL (ref 6–20)
CHLORIDE: 110 mmol/L (ref 101–111)
CO2: 23 mmol/L (ref 22–32)
CREATININE: 0.85 mg/dL (ref 0.44–1.00)
Calcium: 9.5 mg/dL (ref 8.9–10.3)
Glucose, Bld: 179 mg/dL — ABNORMAL HIGH (ref 65–99)
POTASSIUM: 4 mmol/L (ref 3.5–5.1)
Sodium: 139 mmol/L (ref 135–145)
Total Bilirubin: 0.2 mg/dL — ABNORMAL LOW (ref 0.3–1.2)
Total Protein: 6.3 g/dL — ABNORMAL LOW (ref 6.5–8.1)

## 2016-03-02 LAB — URINE MICROSCOPIC-ADD ON: RBC / HPF: NONE SEEN RBC/hpf (ref 0–5)

## 2016-03-02 LAB — URINALYSIS, ROUTINE W REFLEX MICROSCOPIC
Bilirubin Urine: NEGATIVE
GLUCOSE, UA: NEGATIVE mg/dL
Hgb urine dipstick: NEGATIVE
Ketones, ur: NEGATIVE mg/dL
Nitrite: NEGATIVE
PROTEIN: NEGATIVE mg/dL
Specific Gravity, Urine: 1.026 (ref 1.005–1.030)
pH: 5.5 (ref 5.0–8.0)

## 2016-03-02 LAB — LIPASE, BLOOD: LIPASE: 30 U/L (ref 11–51)

## 2016-03-02 MED ORDER — HYDROCODONE-ACETAMINOPHEN 7.5-325 MG/15ML PO SOLN
10.0000 mL | Freq: Once | ORAL | Status: AC
  Administered 2016-03-02: 10 mL via ORAL
  Filled 2016-03-02: qty 15

## 2016-03-02 MED ORDER — FOSFOMYCIN TROMETHAMINE 3 G PO PACK
3.0000 g | PACK | Freq: Once | ORAL | Status: AC
  Administered 2016-03-02: 3 g via ORAL
  Filled 2016-03-02: qty 3

## 2016-03-02 NOTE — Discharge Instructions (Signed)
Your x-ray shows that you have osteoarthritis of your knee.  This was exacerbated by her fall even placed in a supportive garment.  Please continue to use her walker for support.  Follow up with your primary care physician as well.  You have received a one-time dose of antibiotic due to the urine culture that was resulted from your visit 2 weeks ago X-ray of your abdomen is normal.

## 2016-03-02 NOTE — ED Notes (Signed)
Patient transported to X-ray 

## 2016-03-02 NOTE — ED Provider Notes (Signed)
CSN: [id number]     Arrival date & time 03/02/16  1625 History   First MD Initiated Contact with Patient 03/02/16 2105     Chief Complaint  Patient presents with  . Fall  . Knee Pain  . Abdominal Pain     (Consider location/radiation/quality/duration/timing/severity/associated sxs/prior Treatment) HPI Comments: This a 65 year old female who states that she fell after tripping today landing on her right knee, catching herself with her outstretched left hand.  She has pain in her left small finger and right knee.  She was seen 2 weeks ago for lower abdominal pain.  Given a prescription for antibiotics was called the next day and told not to fill this after the urine culture was reviewed.  but called in a prescription for Fosfomycin 3gram PO she could not afford. But was given a new prescription for  She is still having abdominal discomfort in the suprapubic area right at the base of her C-section scar.  She reports that she does not have any nausea, vomiting, diarrhea, constipation, dysuria, frequency, her blood sugars have been well within normal parameters/  Patient is a 65 y.o. female presenting with fall, knee pain, and abdominal pain. The history is provided by the patient.  Fall This is a new problem. The current episode started today. The problem occurs constantly. The problem has been unchanged. Associated symptoms include abdominal pain and joint swelling. Pertinent negatives include no chills, fever, nausea, numbness, rash or vomiting. Nothing aggravates the symptoms. She has tried nothing for the symptoms. The treatment provided no relief.  Knee Pain Associated symptoms: no fever   Abdominal Pain Associated symptoms: no chills, no constipation, no diarrhea, no dysuria, no fever, no nausea, no shortness of breath and no vomiting     Past Medical History  Diagnosis Date  . Hx of CABG     2007  . Hypertension   . Diabetes mellitus   . Ejection fraction     EF 65%, echo, [place], Jan 16, 2011  . Dyslipidemia   . CAD (coronary artery disease)     Nuclear stress test Feb 11, 2011, EF 64%, no scar or ischemia    //         catheterization, November, 2011,patent LIMA-LAD-small caliber distal vessel with diffuse plaque, patent SVG to OM1 and OM 2, patent SVG to PDA and PLA, occluded SVG to diagonal, medical therapy;  Lexiscan Myoview 6/14:  Inferior thinning, no ischemia, EF 61%, low risk  . Overweight(278.02)     stomach stapling 1985 followed by weight loss, then return of weight  . Tobacco abuse     in the past, resolved  . Alcohol abuse     in the past, resolved  . Nausea & vomiting     hospitalization, November, 2011, stricture GE junction, questionable stenosis gastrojejunostomy, disruptive primary peristaltic wave, GE reflux, delayed emptying proximal gastric pouch  . Hx of colonic polyps   . Asthma   . Myocardial infarction ([place])   . Sleep apnea     CPAP being arranged May, 2011//does not use CPAP  . Arthritis   . Blood transfusion without reported diagnosis 1984    after surgery  . Hyperlipidemia   . CHF (congestive heart failure) ([place])   . Chronic back pain   . Sciatica   . Normal cardiac stress test 02/2013  . Breast cancer [facility])     Double mastectomy 2007; s/p tamoxifen and arimidex therapy; no recurrence since 05/2008   Past Surgical  History  Procedure Laterality Date  . Coronary artery bypass graft  2009  . Mastectomy Bilateral 1987  . Coronary stent placement    . Abdominal hysterectomy  1989  . Gastric bypass  1983    open.  [place]  . Cardiac catheterization N/A 12/20/2015    Procedure: Left Heart Cath and Cors/Grafts Angiography;  Surgeon: Leonie Man, MD;  Location: [facility];  Service: Cardiovascular;  Laterality: N/A;   Family History  Problem Relation Age of Onset  . Hypertension Mother   . Heart disease Mother   . Diabetes Mother   . Heart disease Father   . Colon cancer Father     does not know age of onset    . Heart disease Sister   . Colon cancer Sister 70  . Stomach cancer Sister   . Asthma Sister   . Esophageal cancer Neg Hx   . Rectal cancer Neg Hx    Social History  Substance Use Topics  . Smoking status: Former Smoker -- 3.00 packs/day for 27 years    Types: Cigarettes    Quit date: 09/14/2000  . Smokeless tobacco: Never Used  . Alcohol Use: No   OB History    No data available     Review of Systems  Constitutional: Negative for fever and chills.  Respiratory: Negative for shortness of breath.   Cardiovascular: Negative for leg swelling.  Gastrointestinal: Positive for abdominal pain. Negative for nausea, vomiting, diarrhea, constipation and abdominal distention.  Genitourinary: Negative for dysuria.  Musculoskeletal: Positive for joint swelling.  Skin: Negative for rash and wound.  Neurological: Negative for numbness.  All other systems reviewed and are negative.     Allergies  Latex  Home Medications   Prior to Admission medications   Medication Sig Start Date End Date Taking? Authorizing Provider  albuterol (PROVENTIL HFA;VENTOLIN HFA) 108 (90 BASE) MCG/ACT inhaler Inhale 2 puffs into the lungs every 6 (six) hours as needed for shortness of breath.    Yes Historical Provider, MD  aspirin EC 81 MG tablet Take 81 mg by mouth daily.    Yes Historical Provider, MD  atorvastatin (LIPITOR) 40 MG tablet Take 1 tablet (40 mg total) by mouth daily. 07/24/15  Yes [name], MD  cetirizine (ZYRTEC) 10 MG tablet Take 10 mg by mouth daily as needed for allergies.   Yes Historical Provider, MD  glipiZIDE (GLUCOTROL XL) 10 MG 24 hr tablet Take 1 tablet (10 mg total) by mouth daily with breakfast. 12/17/15  Yes [name], NP  Hydrocodone-Acetaminophen (VICODIN HP) 10-300 MG TABS Take 1 tablet by mouth 2 (two) times daily as needed (pain).   Yes Historical Provider, MD  isosorbide mononitrate (IMDUR) 30 MG 24 hr tablet Take 1 tablet (30 mg total) by mouth daily. 11/12/14   Yes [name], PA-C  latanoprost (XALATAN) 0.005 % ophthalmic solution Place 1 drop into both eyes at bedtime.   Yes Historical Provider, MD  lisinopril-hydrochlorothiazide (PRINZIDE,ZESTORETIC) 20-12.5 MG per tablet Take 1 tablet by mouth daily.     Yes Historical Provider, MD  metoprolol tartrate (LOPRESSOR) 25 MG tablet Take 1 tablet (25 mg total) by mouth 2 (two) times daily. 12/03/15  Yes [name], NP  nitroGLYCERIN (NITROSTAT) 0.4 MG SL tablet Place 1 tablet (0.4 mg total) under the tongue every 5 (five) minutes as needed for chest pain. 11/12/14  Yes [name], PA-C  orphenadrine (NORFLEX) 100 MG tablet Take 1 tablet (  100 mg total) by mouth 2 (two) times daily. Patient taking differently: Take 100 mg by mouth 2 (two) times daily as needed.  09/22/15  Yes [name], MD  cephALEXin (KEFLEX) 500 MG capsule Take 1 capsule (500 mg total) by mouth 2 (two) times daily. Patient not taking: Reported on 03/02/2016 02/17/16   [name], MD  Cetirizine HCl 10 MG CAPS Take 1 capsule (10 mg total) by mouth daily. 12/01/14   [name], MD   BP 157/78 mmHg  Pulse 72  Temp(Src) 98 F (36.7 C) (Oral)  Resp 16  SpO2 99% Physical Exam  Constitutional: She is oriented to person, place, and time. She appears well-developed and well-nourished.  HENT:  Head: Normocephalic.  Eyes: Pupils are equal, round, and reactive to light.  Neck: Normal range of motion.  Cardiovascular: Normal rate.   Pulmonary/Chest: Effort normal.  Abdominal: Soft. Bowel sounds are normal. She exhibits no distension. There is tenderness in the suprapubic area.    Musculoskeletal: She exhibits tenderness. She exhibits no edema.       Hands:      Legs: Neurological: She is alert and oriented to person, place, and time.  Nursing note and vitals reviewed.   ED Course  Procedures (including critical care time) Labs Review Labs Reviewed  COMPREHENSIVE METABOLIC PANEL - Abnormal; Notable for the  following:    Glucose, Bld 179 (*)    Total Protein 6.3 (*)    Total Bilirubin 0.2 (*)    All other components within normal limits  URINALYSIS, ROUTINE W REFLEX MICROSCOPIC (NOT AT [facility]) - Abnormal; Notable for the following:    APPearance HAZY (*)    Leukocytes, UA SMALL (*)    All other components within normal limits  URINE MICROSCOPIC-ADD ON - Abnormal; Notable for the following:    Squamous Epithelial / LPF 0-5 (*)    Bacteria, UA FEW (*)    Crystals CA OXALATE CRYSTALS (*)    All other components within normal limits  LIPASE, BLOOD  CBC    Imaging Review Dg Knee Complete 4 Views Right  03/02/2016  CLINICAL DATA:  [place] in bathtub this morning. Right knee injury and pain. Initial encounter. EXAM: RIGHT KNEE - COMPLETE 4+ VIEW COMPARISON:  09/22/2015 FINDINGS: No evidence of fracture, dislocation, or joint effusion. Moderate to severe tricompartmental osteoarthritis is again demonstrated. Multiple surgical clips again seen. IMPRESSION: No acute findings. Moderate to severe tricompartmental osteoarthritis, without significant change. Electronically Signed   By: Earle Gell M.D.   On: 03/02/2016 18:36   Dg Abd Acute W/chest  03/02/2016  CLINICAL DATA:  Subacute onset of lower abdominal pain. Initial encounter. EXAM: DG ABDOMEN ACUTE W/ 1V CHEST COMPARISON:  CT of the abdomen and pelvis from 10/18/2015, and chest radiograph performed 09/22/2015 FINDINGS: There is elevation of the right hemidiaphragm. There is no evidence of focal opacification, pleural effusion or pneumothorax. The cardiomediastinal silhouette is is normal in size. The patient is status post median sternotomy. Clips are seen overlying the right axilla. The visualized bowel gas pattern is unremarkable. Scattered stool and air are seen within the colon; there is no evidence of small bowel dilatation to suggest obstruction. No free intra-abdominal air is identified on the provided upright view. No acute osseous abnormalities are  seen; the sacroiliac joints are unremarkable in appearance. IMPRESSION: 1. Unremarkable bowel gas pattern; no free intra-abdominal air seen. Small to moderate amount of stool noted in the colon. 2. Elevation of the right hemidiaphragm. Lungs remain grossly  clear. Electronically Signed   By: Garald Balding M.D.   On: 03/02/2016 21:46   Dg Finger Little Left  03/02/2016  CLINICAL DATA:  [place] in bathtub this morning. Left little finger injury and pain. Initial encounter. EXAM: LEFT LITTLE FINGER 2+V COMPARISON:  None. FINDINGS: There is no evidence of fracture or dislocation. Mild degenerative spurring is seen involving the distal interphalangeal joint. No other bone lesions identified. IMPRESSION: No acute findings. Mild degenerative spurring at DIP joint. Electronically Signed   By: Earle Gell M.D.   On: 03/02/2016 18:37   I have personally reviewed and evaluated these images and lab results as part of my medical decision-making.   EKG Interpretation None     Patient will be given a one-time dose of fosfomycin 3 g by mouth in the emergency department.  I will also obtain an acute abdomen just to rule out obstruction due to patient's continued discomfort and abdominal surgeries MDM   Final diagnoses:  Osteoarthritis of right knee, unspecified osteoarthritis type  Urinary tract infection without hematuria, site unspecified         Junius Creamer, NP 03/02/16 [place], MD 03/03/16 [phone]

## 2016-03-02 NOTE — ED Notes (Signed)
Pt states fell and complaining of pain in R knee and L 5th finger.  Pt states also seen 2 weeks for abdominal pain. Dc with rx, unable to afford medication. Wants replacement rx. Pt complaining of sharp lower abdominal pain. Denies any vomiting or diarrhea.

## 2016-06-08 ENCOUNTER — Emergency Department (HOSPITAL_COMMUNITY)
Admission: EM | Admit: 2016-06-08 | Discharge: 2016-06-09 | Disposition: A | Discharge status: Home / Self Care | Payer: Medicare Other | Attending: Emergency Medicine | Admitting: Emergency Medicine

## 2016-06-08 ENCOUNTER — Encounter (HOSPITAL_COMMUNITY): Payer: Self-pay | Admitting: Emergency Medicine

## 2016-06-08 ENCOUNTER — Emergency Department (HOSPITAL_COMMUNITY): Payer: Medicare Other

## 2016-06-08 DIAGNOSIS — I2510 Atherosclerotic heart disease of native coronary artery without angina pectoris: Secondary | ICD-10-CM | POA: Insufficient documentation

## 2016-06-08 DIAGNOSIS — R1011 Right upper quadrant pain: Secondary | ICD-10-CM | POA: Diagnosis not present

## 2016-06-08 DIAGNOSIS — E119 Type 2 diabetes mellitus without complications: Secondary | ICD-10-CM | POA: Diagnosis not present

## 2016-06-08 DIAGNOSIS — Z91040 Latex allergy status: Secondary | ICD-10-CM | POA: Insufficient documentation

## 2016-06-08 DIAGNOSIS — I110 Hypertensive heart disease with heart failure: Secondary | ICD-10-CM | POA: Insufficient documentation

## 2016-06-08 DIAGNOSIS — Z955 Presence of coronary angioplasty implant and graft: Secondary | ICD-10-CM | POA: Insufficient documentation

## 2016-06-08 DIAGNOSIS — Z951 Presence of aortocoronary bypass graft: Secondary | ICD-10-CM | POA: Diagnosis not present

## 2016-06-08 DIAGNOSIS — Z79899 Other long term (current) drug therapy: Secondary | ICD-10-CM | POA: Insufficient documentation

## 2016-06-08 DIAGNOSIS — R1031 Right lower quadrant pain: Secondary | ICD-10-CM | POA: Diagnosis present

## 2016-06-08 DIAGNOSIS — I252 Old myocardial infarction: Secondary | ICD-10-CM | POA: Insufficient documentation

## 2016-06-08 DIAGNOSIS — Z87891 Personal history of nicotine dependence: Secondary | ICD-10-CM | POA: Insufficient documentation

## 2016-06-08 DIAGNOSIS — Z7982 Long term (current) use of aspirin: Secondary | ICD-10-CM | POA: Diagnosis not present

## 2016-06-08 DIAGNOSIS — Z853 Personal history of malignant neoplasm of breast: Secondary | ICD-10-CM | POA: Insufficient documentation

## 2016-06-08 DIAGNOSIS — Z7984 Long term (current) use of oral hypoglycemic drugs: Secondary | ICD-10-CM | POA: Diagnosis not present

## 2016-06-08 DIAGNOSIS — I509 Heart failure, unspecified: Secondary | ICD-10-CM | POA: Insufficient documentation

## 2016-06-08 DIAGNOSIS — J45909 Unspecified asthma, uncomplicated: Secondary | ICD-10-CM | POA: Insufficient documentation

## 2016-06-08 LAB — COMPREHENSIVE METABOLIC PANEL
ALK PHOS: 62 U/L (ref 38–126)
ALT: 18 U/L (ref 14–54)
AST: 22 U/L (ref 15–41)
Albumin: 3.7 g/dL (ref 3.5–5.0)
Anion gap: 8 (ref 5–15)
BUN: 8 mg/dL (ref 6–20)
CHLORIDE: 104 mmol/L (ref 101–111)
CO2: 26 mmol/L (ref 22–32)
CREATININE: 0.86 mg/dL (ref 0.44–1.00)
Calcium: 9.3 mg/dL (ref 8.9–10.3)
GFR calc Af Amer: >60 mL/min (ref >60)
Glucose, Bld: 153 mg/dL — ABNORMAL HIGH (ref 65–99)
Potassium: 3.7 mmol/L (ref 3.5–5.1)
Sodium: 138 mmol/L (ref 135–145)
Total Bilirubin: 0.6 mg/dL (ref 0.3–1.2)
Total Protein: 6.4 g/dL — ABNORMAL LOW (ref 6.5–8.1)

## 2016-06-08 LAB — URINALYSIS, ROUTINE W REFLEX MICROSCOPIC
BILIRUBIN URINE: NEGATIVE
Glucose, UA: NEGATIVE mg/dL
Hgb urine dipstick: NEGATIVE
Ketones, ur: NEGATIVE mg/dL
LEUKOCYTES UA: NEGATIVE
NITRITE: NEGATIVE
Protein, ur: NEGATIVE mg/dL
SPECIFIC GRAVITY, URINE: 1.025 (ref 1.005–1.030)
pH: 6 (ref 5.0–8.0)

## 2016-06-08 LAB — CBC
HCT: 42.1 % (ref 36.0–46.0)
Hemoglobin: 13.4 g/dL (ref 12.0–15.0)
MCH: 28.1 pg (ref 26.0–34.0)
MCHC: 31.8 g/dL (ref 30.0–36.0)
MCV: 88.3 fL (ref 78.0–100.0)
Platelets: 201 10*3/uL (ref 150–400)
RBC: 4.77 MIL/uL (ref 3.87–5.11)
RDW: 14.8 % (ref 11.5–15.5)
WBC: 6 10*3/uL (ref 4.0–10.5)

## 2016-06-08 LAB — LIPASE, BLOOD: LIPASE: 27 U/L (ref 11–51)

## 2016-06-08 MED ORDER — FENTANYL CITRATE (PF) 100 MCG/2ML IJ SOLN
50.0000 ug | Freq: Once | INTRAMUSCULAR | Status: AC
  Administered 2016-06-08: 50 ug via INTRAVENOUS
  Filled 2016-06-08: qty 2

## 2016-06-08 MED ORDER — IOPAMIDOL (ISOVUE-300) INJECTION 61%
INTRAVENOUS | Status: AC
Start: 2016-06-08 — End: 2016-06-08
  Administered 2016-06-08: 100 mL
  Filled 2016-06-08: qty 100

## 2016-06-08 MED ORDER — ONDANSETRON HCL 4 MG/2ML IJ SOLN
4.0000 mg | Freq: Once | INTRAMUSCULAR | Status: AC
  Administered 2016-06-08: 4 mg via INTRAVENOUS
  Filled 2016-06-08: qty 2

## 2016-06-08 NOTE — ED Provider Notes (Signed)
[facility] DEPT Provider Note   CSN: [id number] Arrival date & time: 06/08/16  1224   History   Chief Complaint Chief Complaint  Patient presents with  . Abdominal Pain    HPI   Patricia Cuevas is an 65 y.o. female with history of CAD, CHF, DM, HTN, HLD, breast cancer, s/p gastric bypass surgery and total hysterectomy who presents to the ED for evaluation of abdominal pain. She reports her pain started yesterday. She reports constant RLQ pain that waxes and wanes in severity. At times when the pain becomes very severe it radiates to her right flank. She states the pain at first started as dull intermittent pain but is now constant and feels like severe cramps. Denies associated nausea, vomiting, diarrhea. States she has had poor appetite today. Denies dysuria, hematuria, or increased urinary frequency/urgency. Denies fever or chills. Denies chest pain or SOB. She has not tried anything to alleviate her symptoms.  Past Medical History:  Diagnosis Date  . Alcohol abuse    in the past, resolved  . Arthritis   . Asthma   . Blood transfusion without reported diagnosis 1984   after surgery  . Breast cancer [facility])    Double mastectomy 2007; s/p tamoxifen and arimidex therapy; no recurrence since 05/2008  . CAD (coronary artery disease)    Nuclear stress test Feb 11, 2011, EF 64%, no scar or ischemia    //         catheterization, November, 2011,patent LIMA-LAD-small caliber distal vessel with diffuse plaque, patent SVG to OM1 and OM 2, patent SVG to PDA and PLA, occluded SVG to diagonal, medical therapy;  Lexiscan Myoview 6/14:  Inferior thinning, no ischemia, EF 61%, low risk  . CHF (congestive heart failure) ([place])   . Chronic back pain   . Diabetes mellitus   . Dyslipidemia   . Ejection fraction    EF 65%, echo, [place], Jan 16, 2011  . Hx of CABG    2007  . Hx of colonic polyps   . Hyperlipidemia   . Hypertension   . Myocardial infarction ([place])   . Nausea & vomiting    hospitalization, November, 2011, stricture GE junction, questionable stenosis gastrojejunostomy, disruptive primary peristaltic wave, GE reflux, delayed emptying proximal gastric pouch  . Normal cardiac stress test 02/2013  . Overweight(278.02)    stomach stapling 1985 followed by weight loss, then return of weight  . Sciatica   . Sleep apnea    CPAP being arranged May, 2011//does not use CPAP  . Tobacco abuse    in the past, resolved    Patient Active Problem List   Diagnosis Date Noted  . Abnormal nuclear stress test 12/20/2015  . Angina, class III ([place]) 12/20/2015  . Chest pain 11/12/2014  . Upper GI bleed 03/04/2013  . Atypical chest pain 01/07/2013  . History of tobacco abuse 01/07/2013  . SIRS (systemic inflammatory response syndrome) ([place]) 07/28/2012  . Tachycardia 07/28/2012  . GERD (gastroesophageal reflux disease) 02/24/2011  . Coronary artery disease involving native coronary artery with angina pectoris ([place])   . Hx of CABG   . Overweight   . OSA (obstructive sleep apnea)   . Nausea & vomiting   . Breast cancer ([place])   . Hypertension   . Diabetes mellitus ([place])   . Dyslipidemia   . Ejection fraction     Past Surgical History:  Procedure Laterality Date  . ABDOMINAL HYSTERECTOMY  1989  . CARDIAC CATHETERIZATION N/A 12/20/2015  Procedure: Left Heart Cath and Cors/Grafts Angiography;  Surgeon: Leonie Man, MD;  Location: [facility];  Service: Cardiovascular;  Laterality: N/A;  . CORONARY ARTERY BYPASS GRAFT  2009  . CORONARY STENT PLACEMENT    . GASTRIC BYPASS  1983   open.  [place]  . MASTECTOMY Bilateral 1987    OB History    No data available       Home Medications    Prior to Admission medications   Medication Sig Start Date End Date Taking? Authorizing Provider  albuterol (PROVENTIL HFA;VENTOLIN HFA) 108 (90 BASE) MCG/ACT inhaler Inhale 2 puffs into the lungs every 6 (six) hours as needed for shortness of breath.     Historical  Provider, MD  aspirin EC 81 MG tablet Take 81 mg by mouth daily.     Historical Provider, MD  atorvastatin (LIPITOR) 40 MG tablet Take 1 tablet (40 mg total) by mouth daily. 07/24/15   [name], MD  cephALEXin (KEFLEX) 500 MG capsule Take 1 capsule (500 mg total) by mouth 2 (two) times daily. Patient not taking: Reported on 03/02/2016 02/17/16   [name], MD  cetirizine (ZYRTEC) 10 MG tablet Take 10 mg by mouth daily as needed for allergies.    Historical Provider, MD  Cetirizine HCl 10 MG CAPS Take 1 capsule (10 mg total) by mouth daily. 12/01/14   [name], MD  glipiZIDE (GLUCOTROL XL) 10 MG 24 hr tablet Take 1 tablet (10 mg total) by mouth daily with breakfast. 12/17/15   [name], NP  Hydrocodone-Acetaminophen (VICODIN HP) 10-300 MG TABS Take 1 tablet by mouth 2 (two) times daily as needed (pain).    Historical Provider, MD  isosorbide mononitrate (IMDUR) 30 MG 24 hr tablet Take 1 tablet (30 mg total) by mouth daily. 11/12/14   [name], PA-C  latanoprost (XALATAN) 0.005 % ophthalmic solution Place 1 drop into both eyes at bedtime.    Historical Provider, MD  lisinopril-hydrochlorothiazide (PRINZIDE,ZESTORETIC) 20-12.5 MG per tablet Take 1 tablet by mouth daily.      Historical Provider, MD  metoprolol tartrate (LOPRESSOR) 25 MG tablet Take 1 tablet (25 mg total) by mouth 2 (two) times daily. 12/03/15   [name], NP  nitroGLYCERIN (NITROSTAT) 0.4 MG SL tablet Place 1 tablet (0.4 mg total) under the tongue every 5 (five) minutes as needed for chest pain. 11/12/14   [name], PA-C  orphenadrine (NORFLEX) 100 MG tablet Take 1 tablet (100 mg total) by mouth 2 (two) times daily. Patient taking differently: Take 100 mg by mouth 2 (two) times daily as needed.  09/22/15   [name], MD    Family History Family History  Problem Relation Age of Onset  . Hypertension Mother   . Heart disease Mother   . Diabetes Mother   . Heart disease Father   . Colon  cancer Father     does not know age of onset  . Heart disease Sister   . Colon cancer Sister 54  . Stomach cancer Sister   . Asthma Sister   . Esophageal cancer Neg Hx   . Rectal cancer Neg Hx     Social History Social History  Substance Use Topics  . Smoking status: Former Smoker    Packs/day: 3.00    Years: 27.00    Types: Cigarettes    Quit date: 09/14/2000  . Smokeless tobacco: Never Used  . Alcohol use No     Allergies  Latex   Review of Systems Review of Systems 10 Systems reviewed and are negative for acute change except as noted in the HPI.  Physical Exam Updated Vital Signs BP (!) 119/41 (BP Location: Right Arm)   Pulse (!) 56   Temp 98.1 F (36.7 C) (Oral)   Resp 14   Wt 97.5 kg   SpO2 100%   BMI 43.42 kg/m   Physical Exam  Constitutional: She is oriented to person, place, and time.  HENT:  Right Ear: External ear normal.  Left Ear: External ear normal.  Nose: Nose normal.  Mouth/Throat: Oropharynx is clear and moist. No oropharyngeal exudate.  Eyes: Conjunctivae are normal.  Neck: Neck supple.  Cardiovascular: Normal rate, regular rhythm, normal heart sounds and intact distal pulses.   Pulmonary/Chest: Effort normal and breath sounds normal. No respiratory distress. She has no wheezes.  Abdominal: Soft. Bowel sounds are normal. She exhibits no distension. There is tenderness. There is guarding. There is no rebound.  Tenderness to RUQ and RLQ with guarding. No rebound. Negative rovsing's. Negative psoas sign. No CVA tenderness. Abdomen soft and obese.  Musculoskeletal: She exhibits no edema.  Lymphadenopathy:    She has no cervical adenopathy.  Neurological: She is alert and oriented to person, place, and time. No cranial nerve deficit.  Skin: Skin is warm and dry.  Psychiatric: She has a normal mood and affect.  Nursing note and vitals reviewed.    ED Treatments / Results  Labs (all labs ordered are listed, but only abnormal results are  displayed) Labs Reviewed  COMPREHENSIVE METABOLIC PANEL - Abnormal; Notable for the following:       Result Value   Glucose, Bld 153 (*)    Total Protein 6.4 (*)    All other components within normal limits  LIPASE, BLOOD  CBC  URINALYSIS, ROUTINE W REFLEX MICROSCOPIC (NOT AT [facility])    EKG  EKG Interpretation None       Radiology Ct Abdomen Pelvis W Contrast  Result Date: 06/09/2016 CLINICAL DATA:  65 year old female with right lower quadrant abdominal pain. EXAM: CT ABDOMEN AND PELVIS WITH CONTRAST TECHNIQUE: Multidetector CT imaging of the abdomen and pelvis was performed using the standard protocol following bolus administration of intravenous contrast. CONTRAST:  1 ISOVUE-300 IOPAMIDOL (ISOVUE-300) INJECTION 61% COMPARISON:  Radiograph dated 02/1916 and CT dated 10/18/2015 FINDINGS: Lower chest: There is eventration of the right hemidiaphragm with right lung base atelectatic changes. Infiltrate is less likely. The visualized lung bases are otherwise clear. There is advanced coronary vascular calcification. No intra-abdominal free air or free fluid. Hepatobiliary: No focal liver abnormality is seen. No gallstones, gallbladder wall thickening, or biliary dilatation. Pancreas: Unremarkable. No pancreatic ductal dilatation or surrounding inflammatory changes. Spleen: Normal in size without focal abnormality. Adrenals/Urinary Tract: Adrenal glands are unremarkable. Kidneys are normal, without renal calculi, focal lesion, or hydronephrosis. Bladder is unremarkable. Stomach/Bowel: This postsurgical changes of gastric bypass with anastomotic suture. There is no evidence of bowel obstruction or active inflammation. The appendix is not visualized with certainty. No inflammatory changes identified in the right lower quadrant. Vascular/Lymphatic: There is aortoiliac atherosclerotic disease. The origins of the celiac axis, SMA, IMA and under renal arteries are patent. No portal venous gas identified.  There is no adenopathy. Reproductive: Hysterectomy. Other:  None Musculoskeletal: Postsurgical changes of ventral hernia repair. The abdominal wall soft tissues appear unremarkable. There is degenerative changes of the spine. Multilevel disc desiccation with vacuum phenomena. No acute fracture. IMPRESSION: No acute intra-abdominal pelvic pathology.  Electronically Signed   By: Anner Crete M.D.   On: 06/09/2016 00:59    Procedures Procedures (including critical care time)  Medications Ordered in ED Medications  fentaNYL (SUBLIMAZE) injection 50 mcg (50 mcg Intravenous Given 06/08/16 2226)  ondansetron (ZOFRAN) injection 4 mg (4 mg Intravenous Given 06/08/16 2226)  iopamidol (ISOVUE-300) 61 % injection (100 mLs  Contrast Given 06/08/16 2307)     Initial Impression / Assessment and Plan / ED Course  I have reviewed the triage vital signs and the nursing notes.  Pertinent labs & imaging results that were available during my care of the patient were reviewed by me and considered in my medical decision making (see chart for details).  Clinical Course    Pain resolved with meds in the ED. CT abd/pelvis negative for acute findings. Repeat abdominal exam benign. Pt hemodynamically stable and nontoxic appearing. Will d/c home with instructions for close PCP follow up. Strict ER return precautions given.  Final Clinical Impressions(s) / ED Diagnoses   Final diagnoses:  Right lower quadrant abdominal pain    New Prescriptions Discharge Medication List as of 06/09/2016  1:03 AM    START taking these medications   Details  traMADol (ULTRAM) 50 MG tablet Take 1 tablet (50 mg total) by mouth every 6 (six) hours as needed., Starting Tue 06/09/2016, Print         [name], PA-C 06/09/16 2125    Julianne Rice, MD 06/10/16 [phone]

## 2016-06-08 NOTE — ED Notes (Signed)
Pt c/o pain in mid back and right lower abd pain.  Onset yesterday.  Pt denies nausea, vomiting or diarrhea

## 2016-06-08 NOTE — ED Notes (Signed)
IV attempted x's 1 without success. 

## 2016-06-08 NOTE — ED Triage Notes (Addendum)
abd pain rt side that started  Yesterday, no n/v/sob/d, last bm this am  It was good, was able to eat this am, denies cp

## 2016-06-09 MED ORDER — TRAMADOL HCL 50 MG PO TABS: 50.0000 mg | ORAL_TABLET | Freq: Four times a day (QID) | ORAL | 0 refills | Status: DC | PRN

## 2016-06-09 NOTE — Discharge Instructions (Signed)
Your labs and CT scan were normal. Take medication as prescribed as needed for pain. Follow up with your primary care provider this week. Return to the ER for new or worsening symptoms.

## 2016-07-09 ENCOUNTER — Other Ambulatory Visit: Payer: Self-pay | Admitting: Nurse Practitioner

## 2016-07-09 DIAGNOSIS — R0789 Other chest pain: Secondary | ICD-10-CM

## 2016-10-06 ENCOUNTER — Emergency Department (HOSPITAL_COMMUNITY): Payer: Medicare Other

## 2016-10-06 ENCOUNTER — Emergency Department (HOSPITAL_COMMUNITY)
Admission: EM | Admit: 2016-10-06 | Discharge: 2016-10-07 | Disposition: A | Discharge status: Home / Self Care | Payer: Medicare Other | Attending: Emergency Medicine | Admitting: Emergency Medicine

## 2016-10-06 ENCOUNTER — Encounter (HOSPITAL_COMMUNITY): Payer: Self-pay

## 2016-10-06 DIAGNOSIS — J45909 Unspecified asthma, uncomplicated: Secondary | ICD-10-CM | POA: Diagnosis not present

## 2016-10-06 DIAGNOSIS — Z79899 Other long term (current) drug therapy: Secondary | ICD-10-CM | POA: Diagnosis not present

## 2016-10-06 DIAGNOSIS — Z853 Personal history of malignant neoplasm of breast: Secondary | ICD-10-CM | POA: Insufficient documentation

## 2016-10-06 DIAGNOSIS — E119 Type 2 diabetes mellitus without complications: Secondary | ICD-10-CM | POA: Diagnosis not present

## 2016-10-06 DIAGNOSIS — I509 Heart failure, unspecified: Secondary | ICD-10-CM | POA: Insufficient documentation

## 2016-10-06 DIAGNOSIS — Z7984 Long term (current) use of oral hypoglycemic drugs: Secondary | ICD-10-CM | POA: Diagnosis not present

## 2016-10-06 DIAGNOSIS — Z87891 Personal history of nicotine dependence: Secondary | ICD-10-CM | POA: Insufficient documentation

## 2016-10-06 DIAGNOSIS — Z955 Presence of coronary angioplasty implant and graft: Secondary | ICD-10-CM | POA: Insufficient documentation

## 2016-10-06 DIAGNOSIS — Z951 Presence of aortocoronary bypass graft: Secondary | ICD-10-CM | POA: Insufficient documentation

## 2016-10-06 DIAGNOSIS — I2510 Atherosclerotic heart disease of native coronary artery without angina pectoris: Secondary | ICD-10-CM | POA: Insufficient documentation

## 2016-10-06 DIAGNOSIS — Z7982 Long term (current) use of aspirin: Secondary | ICD-10-CM | POA: Insufficient documentation

## 2016-10-06 DIAGNOSIS — R079 Chest pain, unspecified: Secondary | ICD-10-CM | POA: Insufficient documentation

## 2016-10-06 DIAGNOSIS — I110 Hypertensive heart disease with heart failure: Secondary | ICD-10-CM | POA: Diagnosis not present

## 2016-10-06 DIAGNOSIS — Z91040 Latex allergy status: Secondary | ICD-10-CM | POA: Diagnosis not present

## 2016-10-06 LAB — BASIC METABOLIC PANEL
Anion gap: 10 (ref 5–15)
BUN: 7 mg/dL (ref 6–20)
CHLORIDE: 104 mmol/L (ref 101–111)
CO2: 25 mmol/L (ref 22–32)
Calcium: 9.5 mg/dL (ref 8.9–10.3)
Creatinine, Ser: 0.75 mg/dL (ref 0.44–1.00)
GFR calc Af Amer: >60 mL/min (ref >60)
GFR calc non Af Amer: >60 mL/min (ref >60)
Glucose, Bld: 74 mg/dL (ref 65–99)
POTASSIUM: 3.7 mmol/L (ref 3.5–5.1)
SODIUM: 139 mmol/L (ref 135–145)

## 2016-10-06 LAB — CBC
HEMATOCRIT: 41 % (ref 36.0–46.0)
HEMOGLOBIN: 13.3 g/dL (ref 12.0–15.0)
MCH: 28.3 pg (ref 26.0–34.0)
MCHC: 32.4 g/dL (ref 30.0–36.0)
MCV: 87.2 fL (ref 78.0–100.0)
Platelets: 193 10*3/uL (ref 150–400)
RBC: 4.7 MIL/uL (ref 3.87–5.11)
RDW: 14.9 % (ref 11.5–15.5)
WBC: 6.1 10*3/uL (ref 4.0–10.5)

## 2016-10-06 LAB — I-STAT TROPONIN, ED: Troponin i, poc: 0 ng/mL (ref 0.00–0.08)

## 2016-10-06 NOTE — ED Provider Notes (Signed)
[facility] DEPT Provider Note   CSN: [id number] Arrival date & time:      By signing my name below, I, Patricia Cuevas, attest that this documentation has been prepared under the direction and in the presence of [name], MD  Electronically Signed: [place], ED Scribe. 10/06/16. 11:18 PM.   History   Chief Complaint Chief Complaint  Patient presents with  . Chest Pain   The history is provided by the patient. No language interpreter was used.   HPI Comments:  Patricia Cuevas is a 66 y.o. female, with a hx of CAD, CHF, MI, DM, HTN, hyperlipidemia, a PSHx of a cardiac catheterization on 12/20/15 and CABG, who presents to the Emergency Department, via EMS, complaining of intermittent, "8/10" chest pain x several hours. She states her episodes of pain lasts for a couple of minutes. Pt also reports numbness and fatigue. Nothing makes her pain worse. She has taken 4 baby aspirin with relief. Pt denies current chest pain, SOB, nausea, diaphoresis and any other associated symptoms at this time. Pt is a non-smoker.   PCP: [name], MD  Past Medical History:  Diagnosis Date  . Alcohol abuse    in the past, resolved  . Arthritis   . Asthma   . Blood transfusion without reported diagnosis 1984   after surgery  . Breast cancer [facility])    Double mastectomy 2007; s/p tamoxifen and arimidex therapy; no recurrence since 05/2008  . CAD (coronary artery disease)    Nuclear stress test Feb 11, 2011, EF 64%, no scar or ischemia    //         catheterization, November, 2011,patent LIMA-LAD-small caliber distal vessel with diffuse plaque, patent SVG to OM1 and OM 2, patent SVG to PDA and PLA, occluded SVG to diagonal, medical therapy;  Lexiscan Myoview 6/14:  Inferior thinning, no ischemia, EF 61%, low risk  . CHF (congestive heart failure) ([place])   . Chronic back pain   . Diabetes mellitus   . Dyslipidemia   . Ejection fraction    EF 65%, echo, [place], Jan 16, 2011  . Hx of CABG    2007  . Hx of colonic polyps   . Hyperlipidemia   . Hypertension   . Myocardial infarction   . Nausea & vomiting    hospitalization, November, 2011, stricture GE junction, questionable stenosis gastrojejunostomy, disruptive primary peristaltic wave, GE reflux, delayed emptying proximal gastric pouch  . Normal cardiac stress test 02/2013  . Overweight(278.02)    stomach stapling 1985 followed by weight loss, then return of weight  . Sciatica   . Sleep apnea    CPAP being arranged May, 2011//does not use CPAP  . Tobacco abuse    in the past, resolved    Patient Active Problem List   Diagnosis Date Noted  . Abnormal nuclear stress test 12/20/2015  . Angina, class III ([place]) 12/20/2015  . Chest pain 11/12/2014  . Upper GI bleed 03/04/2013  . Atypical chest pain 01/07/2013  . History of tobacco abuse 01/07/2013  . SIRS (systemic inflammatory response syndrome) ([place]) 07/28/2012  . Tachycardia 07/28/2012  . GERD (gastroesophageal reflux disease) 02/24/2011  . Coronary artery disease involving native coronary artery with angina pectoris ([place])   . Hx of CABG   . Overweight   . OSA (obstructive sleep apnea)   . Nausea & vomiting   . Breast cancer ([place])   . Hypertension   . Diabetes mellitus ([place])   . Dyslipidemia   .  Ejection fraction     Past Surgical History:  Procedure Laterality Date  . ABDOMINAL HYSTERECTOMY  1989  . CARDIAC CATHETERIZATION N/A 12/20/2015   Procedure: Left Heart Cath and Cors/Grafts Angiography;  Surgeon: Leonie Man, MD;  Location: [facility] CV LAB;  Service: Cardiovascular;  Laterality: N/A;  . CORONARY ARTERY BYPASS GRAFT  2009  . CORONARY STENT PLACEMENT    . GASTRIC BYPASS  1983   open.  [place]  . MASTECTOMY Bilateral 1987    OB History    No data available       Home Medications    Prior to Admission medications   Medication Sig Start Date End Date Taking? Authorizing Provider  albuterol (PROVENTIL HFA;VENTOLIN HFA) 108 (90 BASE)  MCG/ACT inhaler Inhale 2 puffs into the lungs every 6 (six) hours as needed for shortness of breath.     Historical Provider, MD  aspirin EC 81 MG tablet Take 81 mg by mouth daily.     Historical Provider, MD  atorvastatin (LIPITOR) 40 MG tablet Take 1 tablet (40 mg total) by mouth daily. 07/24/15   [name], MD  cephALEXin (KEFLEX) 500 MG capsule Take 1 capsule (500 mg total) by mouth 2 (two) times daily. Patient not taking: Reported on 06/08/2016 02/17/16   [name], MD  cetirizine (ZYRTEC) 10 MG tablet Take 10 mg by mouth daily as needed for allergies.    Historical Provider, MD  Cetirizine HCl 10 MG CAPS Take 1 capsule (10 mg total) by mouth daily. Patient not taking: Reported on 06/08/2016 12/01/14   [name], MD  glipiZIDE (GLUCOTROL XL) 10 MG 24 hr tablet Take 1 tablet (10 mg total) by mouth daily with breakfast. 12/17/15   [name], NP  Hydrocodone-Acetaminophen (VICODIN HP) 10-300 MG TABS Take 1 tablet by mouth 2 (two) times daily as needed (pain).    Historical Provider, MD  isosorbide mononitrate (IMDUR) 30 MG 24 hr tablet Take 1 tablet (30 mg total) by mouth daily. 11/12/14   [name], PA-C  latanoprost (XALATAN) 0.005 % ophthalmic solution Place 1 drop into both eyes at bedtime.    Historical Provider, MD  lisinopril-hydrochlorothiazide (PRINZIDE,ZESTORETIC) 20-12.5 MG per tablet Take 1 tablet by mouth daily.      Historical Provider, MD  metoprolol tartrate (LOPRESSOR) 25 MG tablet take 1 tablet by mouth twice a day 07/09/16   [name], MD  nitroGLYCERIN (NITROSTAT) 0.4 MG SL tablet Place 1 tablet (0.4 mg total) under the tongue every 5 (five) minutes as needed for chest pain. 11/12/14   [name], PA-C  orphenadrine (NORFLEX) 100 MG tablet Take 1 tablet (100 mg total) by mouth 2 (two) times daily. Patient not taking: Reported on 06/08/2016 09/22/15   [name], MD  traMADol (ULTRAM) 50 MG tablet Take 1 tablet (50 mg total) by mouth every 6 (six)  hours as needed. 06/09/16   [name], PA-C    Family History Family History  Problem Relation Age of Onset  . Hypertension Mother   . Heart disease Mother   . Diabetes Mother   . Heart disease Father   . Colon cancer Father     does not know age of onset  . Heart disease Sister   . Colon cancer Sister 25  . Stomach cancer Sister   . Asthma Sister   . Esophageal cancer Neg Hx   . Rectal cancer Neg Hx     Social History Social History  Substance Use Topics  . Smoking status: Former Smoker    Packs/day: 3.00    Years: 27.00    Types: Cigarettes    Quit date: 09/14/2000  . Smokeless tobacco: Never Used  . Alcohol use No     Allergies   Latex   Review of Systems Review of Systems  Constitutional: Positive for fatigue. Negative for diaphoresis.  Respiratory: Negative for shortness of breath.   Cardiovascular: Positive for chest pain.  Gastrointestinal: Negative for nausea.  All other systems reviewed and are negative.    Physical Exam Updated Vital Signs BP 123/69   Pulse (!) 54   Temp 97.9 F (36.6 C) (Oral)   Resp 17   Ht 4\' 11"  (1.499 m)   Wt 215 lb (97.5 kg)   SpO2 99%   BMI 43.42 kg/m   Physical Exam  Constitutional: She is oriented to person, place, and time. She appears well-developed and well-nourished.  HENT:  Head: Normocephalic and atraumatic.  Eyes: EOM are normal. Pupils are equal, round, and reactive to light.  Neck: Normal range of motion. Neck supple. No JVD present.  Cardiovascular: Normal rate, regular rhythm and normal heart sounds.   No murmur heard. Pulmonary/Chest: Effort normal and breath sounds normal. She has no wheezes. She has no rales. She exhibits no tenderness.  Abdominal: Soft. Bowel sounds are normal. She exhibits no distension and no mass. There is no tenderness.  Musculoskeletal: Normal range of motion. She exhibits no edema.  Lymphadenopathy:    She has no cervical adenopathy.  Neurological: She is alert and  oriented to person, place, and time. No cranial nerve deficit. She exhibits normal muscle tone. Coordination normal.  Skin: Skin is warm and dry. No rash noted.  Psychiatric: She has a normal mood and affect. Her behavior is normal. Judgment and thought content normal.  Nursing note and vitals reviewed.    ED Treatments / Results  DIAGNOSTIC STUDIES:  Oxygen Saturation is 99% on RA, normal by my interpretation.    COORDINATION OF CARE:  11:18 PM Discussed treatment plan with pt at bedside and pt agreed to plan.  Labs (all labs ordered are listed, but only abnormal results are displayed) Labs Reviewed  BASIC METABOLIC PANEL  CBC  I-STAT [place], ED  I-STAT TROPOININ, ED  Randolm Idol, ED    EKG  EKG Interpretation  Date/Time:  Tuesday October 06 2016 22:33:27 EST Ventricular Rate:  68 PR Interval:    QRS Duration: 91 QT Interval:  395 QTC Calculation: 421 R Axis:   16 Text Interpretation:  Sinus rhythm Abnormal R-wave progression, early transition When compared with ECG of 12/13/2015, Nonspecific T wave abnormality has improved Confirmed by [facility]  MD, Antoine Fiallos ([id number]) on 10/06/2016 11:05:40 PM       Radiology Dg Chest 2 View  Result Date: 10/07/2016 CLINICAL DATA:  66 year old female with chest pain. EXAM: CHEST  2 VIEW COMPARISON:  Chest radiograph dated 03/02/2016 FINDINGS: There is mild eventration of the right hemidiaphragm similar to prior study. Mild diffuse interstitial prominence for appear chronic. There is no focal consolidation, pleural effusion, or pneumothorax. The cardiac silhouette is within normal limits. Median sternotomy wires and CABG vascular clips noted. No acute osseous pathology identified. Surgical clips noted in the right axilla and right lateral chest wall. Epigastric surgical sutures related to gassy to bypass noted. IMPRESSION: No active cardiopulmonary disease. Electronically Signed   By: Anner Crete M.D.   On: 10/07/2016 00:19   Dg  Shoulder Right  Result Date: 10/07/2016 CLINICAL DATA:  Initial evaluation for acute shoulder pain for 2 weeks. EXAM: RIGHT SHOULDER - 2+ VIEW COMPARISON:  Prior radiograph from 03/02/2015. FINDINGS: No acute fracture or dislocation. Humeral head in normal alignment with the glenoid. AC joint approximated. Degenerative osteoarthritic changes noted about the glenohumeral and AC joints. No periarticular calcification. Surgical clips present within the right axilla. Right hemithorax is clear. Sequela prior CABG noted. IMPRESSION: 1. No acute osseous abnormality about the right shoulder. 2. Degenerative osteoarthritic changes about the right glenohumeral and acromioclavicular joints. Electronically Signed   By: Jeannine Boga M.D.   On: 10/07/2016 02:14    Procedures Procedures (including critical care time)  Medications Ordered in ED Medications - No data to display   Initial Impression / Assessment and Plan / ED Course  I have reviewed the triage vital signs and the nursing notes.  Pertinent labs & imaging results that were available during my care of the patient were reviewed by me and considered in my medical decision making (see chart for details).  Patient with known history of coronary disease presents with intermittent chest pain tonight. She has been pain-free since arriving in the ED and, actually for several hours before then. Old records are reviewed, and she has severe underlying 3 vessel coronary artery disease and had coronary artery bypass and catheterization last April did show stenosis within the graft. She is not felt to have any myocardium at risk. ECG shows no changes from baseline and troponin is negative. She's kept in the ED and continues to be pain-free and delta troponin is negative. She is discharged with instructions to follow-up with her cardiologist. Also, she is being evaluated by her PCP for right shoulder pain. She had missed an appointment for an x-ray so x-ray  was done while she was in the ED and this shows no acute changes.  Final Clinical Impressions(s) / ED Diagnoses   Final diagnoses:  Nonspecific chest pain    New Prescriptions New Prescriptions   No medications on file   I personally performed the services described in this documentation, which was scribed in my presence. The recorded information has been reviewed and is accurate.       [name], MD [phone]
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
## 2016-10-06 NOTE — ED Triage Notes (Signed)
Pt BIB [facility] from home. Pt reports sharp CP with no radiation starting today while at grocery store. Pt report Hx of stents X3 years ago and states this is the first time in a while she has had CP. Pt had 1 aspirin as daily med this am and took 3 pta of EMS. A&) upon arrival.

## 2016-10-07 ENCOUNTER — Emergency Department (HOSPITAL_COMMUNITY): Payer: Medicare Other

## 2016-10-07 DIAGNOSIS — R079 Chest pain, unspecified: Secondary | ICD-10-CM | POA: Diagnosis not present

## 2016-10-07 LAB — I-STAT TROPONIN, ED: TROPONIN I, POC: 0 ng/mL (ref 0.00–0.08)

## 2016-10-07 NOTE — Discharge Instructions (Signed)
Return if pain is getting worse. °

## 2016-10-07 NOTE — ED Notes (Signed)
Pt returned from X-ray.  

## 2016-10-07 NOTE — ED Notes (Signed)
Pt verbalized understanding of d/c instructions and has no further questions. Pt is stable, A&Ox4, VSS. Pt called her ride to pick her up

## 2016-10-07 NOTE — ED Notes (Signed)
EDP at bedside  

## 2016-11-16 ENCOUNTER — Encounter (HOSPITAL_COMMUNITY): Payer: Self-pay | Admitting: Emergency Medicine

## 2016-11-16 ENCOUNTER — Emergency Department (HOSPITAL_COMMUNITY): Payer: Medicare Other

## 2016-11-16 ENCOUNTER — Emergency Department (HOSPITAL_COMMUNITY)
Admission: EM | Admit: 2016-11-16 | Discharge: 2016-11-16 | Disposition: A | Discharge status: Home / Self Care | Payer: Medicare Other | Attending: Emergency Medicine | Admitting: Emergency Medicine

## 2016-11-16 DIAGNOSIS — Z7984 Long term (current) use of oral hypoglycemic drugs: Secondary | ICD-10-CM | POA: Insufficient documentation

## 2016-11-16 DIAGNOSIS — Z7982 Long term (current) use of aspirin: Secondary | ICD-10-CM | POA: Diagnosis not present

## 2016-11-16 DIAGNOSIS — Z853 Personal history of malignant neoplasm of breast: Secondary | ICD-10-CM | POA: Insufficient documentation

## 2016-11-16 DIAGNOSIS — I2510 Atherosclerotic heart disease of native coronary artery without angina pectoris: Secondary | ICD-10-CM | POA: Diagnosis not present

## 2016-11-16 DIAGNOSIS — Z955 Presence of coronary angioplasty implant and graft: Secondary | ICD-10-CM | POA: Insufficient documentation

## 2016-11-16 DIAGNOSIS — Z91040 Latex allergy status: Secondary | ICD-10-CM | POA: Insufficient documentation

## 2016-11-16 DIAGNOSIS — Z951 Presence of aortocoronary bypass graft: Secondary | ICD-10-CM | POA: Insufficient documentation

## 2016-11-16 DIAGNOSIS — Z79899 Other long term (current) drug therapy: Secondary | ICD-10-CM | POA: Diagnosis not present

## 2016-11-16 DIAGNOSIS — E119 Type 2 diabetes mellitus without complications: Secondary | ICD-10-CM | POA: Diagnosis not present

## 2016-11-16 DIAGNOSIS — I10 Essential (primary) hypertension: Secondary | ICD-10-CM | POA: Diagnosis not present

## 2016-11-16 DIAGNOSIS — M25511 Pain in right shoulder: Secondary | ICD-10-CM | POA: Diagnosis present

## 2016-11-16 DIAGNOSIS — M25561 Pain in right knee: Secondary | ICD-10-CM | POA: Diagnosis not present

## 2016-11-16 DIAGNOSIS — I252 Old myocardial infarction: Secondary | ICD-10-CM | POA: Diagnosis not present

## 2016-11-16 DIAGNOSIS — G8929 Other chronic pain: Secondary | ICD-10-CM | POA: Diagnosis not present

## 2016-11-16 MED ORDER — TRAMADOL HCL 50 MG PO TABS: 50.0000 mg | ORAL_TABLET | Freq: Four times a day (QID) | ORAL | 0 refills | Status: DC | PRN

## 2016-11-16 MED ORDER — TRAMADOL HCL 50 MG PO TABS
50.0000 mg | ORAL_TABLET | Freq: Once | ORAL | Status: AC
  Administered 2016-11-16: 50 mg via ORAL
  Filled 2016-11-16: qty 1

## 2016-11-16 NOTE — ED Triage Notes (Signed)
Patient reports right shoulder and right knee pain x1 week. Denies injury. Reports pain increases with movement. Ambulatory to triage.
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
## 2016-11-16 NOTE — ED Notes (Signed)
Patient transported to X-ray 

## 2016-11-16 NOTE — Discharge Instructions (Signed)
Tramadol only as needed for pain. Ice affected area for additional pain relief.  Please call your orthopedist (listed) tomorrow morning to schedule a follow up appointment.  Return to ER for new or worsening symptoms, any additional concerns.

## 2016-12-24 ENCOUNTER — Encounter: Payer: Self-pay | Admitting: Cardiology

## 2017-01-08 ENCOUNTER — Ambulatory Visit: Payer: Medicare Other | Admitting: Cardiology

## 2017-01-15 ENCOUNTER — Encounter: Payer: Self-pay | Admitting: Cardiology

## 2017-01-15 ENCOUNTER — Ambulatory Visit (INDEPENDENT_AMBULATORY_CARE_PROVIDER_SITE_OTHER): Payer: Medicare Other | Admitting: Cardiology

## 2017-01-15 ENCOUNTER — Encounter (INDEPENDENT_AMBULATORY_CARE_PROVIDER_SITE_OTHER): Payer: Self-pay

## 2017-01-15 VITALS — BP 124/84 | HR 50 | Ht <= 58 in | Wt 212.0 lb

## 2017-01-15 DIAGNOSIS — Z951 Presence of aortocoronary bypass graft: Secondary | ICD-10-CM

## 2017-01-15 DIAGNOSIS — E785 Hyperlipidemia, unspecified: Secondary | ICD-10-CM

## 2017-01-15 DIAGNOSIS — R0789 Other chest pain: Secondary | ICD-10-CM | POA: Diagnosis not present

## 2017-01-15 MED ORDER — NITROGLYCERIN 0.4 MG SL SUBL: 0.4000 mg | SUBLINGUAL_TABLET | SUBLINGUAL | 3 refills | Status: DC | PRN

## 2017-01-15 NOTE — Patient Instructions (Signed)

## 2017-01-15 NOTE — Progress Notes (Signed)
Cardiology Office Note   Date:  01/15/2017   ID:  Patricia Cuevas, DOB [birth year], MRN [id number]  PCP:  [name], MD  Cardiologist:   Candee Furbish, MD       History of Present Illness: Patricia Cuevas is a 66 y.o. female former patient of Dr. Ron Parker here for follow-up appointment.  Has coronary disease post bypass surgery in 2007 with catheterization in 2011 and 2017 demonstrating patent LIMA to LAD, small caliber vessel distally diffuse plaque with patent SVG to OM1 and OM 2, patent SVG to PDA and PLA, occluded SVG to diagonal. Last cath in 2017 secondary to abnormal NUC stress.   She seeks the majority of her care in the emergency department.  Went to ER 01/29/2016 - abdominal pain, ruq [place] normal.   Multiple episodes of atypical chest pain. No CP, no SOB, no edema.   Compliant with meds.  Normal EF.  01/15/17-she was in the emergency room on 11/16/16 and 10/06/16 with nonspecific chest pain and right shoulder pain. Reassuring workup. Last catheterization 12/20/15. See below. Fell and problems with right shoulder, pain in neck as well.   Past Medical History:  Diagnosis Date  . Alcohol abuse    in the past, resolved  . Arthritis   . Asthma   . Blood transfusion without reported diagnosis 1984   after surgery  . Breast cancer [facility])    Double mastectomy 2007; s/p tamoxifen and arimidex therapy; no recurrence since 05/2008  . CAD (coronary artery disease)    Nuclear stress test Feb 11, 2011, EF 64%, no scar or ischemia    //         catheterization, November, 2011,patent LIMA-LAD-small caliber distal vessel with diffuse plaque, patent SVG to OM1 and OM 2, patent SVG to PDA and PLA, occluded SVG to diagonal, medical therapy;  Lexiscan Myoview 6/14:  Inferior thinning, no ischemia, EF 61%, low risk  . CHF (congestive heart failure) ([place])   . Chronic back pain   . Diabetes mellitus   . Dyslipidemia   . Ejection fraction    EF 65%, echo, [place], Jan 16, 2011  . Hx of CABG     2007  . Hx of colonic polyps   . Hyperlipidemia   . Hypertension   . Myocardial infarction ([place])   . Nausea & vomiting    hospitalization, November, 2011, stricture GE junction, questionable stenosis gastrojejunostomy, disruptive primary peristaltic wave, GE reflux, delayed emptying proximal gastric pouch  . Normal cardiac stress test 02/2013  . Overweight(278.02)    stomach stapling 1985 followed by weight loss, then return of weight  . Sciatica   . Sleep apnea    CPAP being arranged May, 2011//does not use CPAP  . Tobacco abuse    in the past, resolved    Past Surgical History:  Procedure Laterality Date  . ABDOMINAL HYSTERECTOMY  1989  . CARDIAC CATHETERIZATION N/A 12/20/2015   Procedure: Left Heart Cath and Cors/Grafts Angiography;  Surgeon: Leonie Man, MD;  Location: [place] [facility];  Service: Cardiovascular;  Laterality: N/A;  . CORONARY ARTERY BYPASS GRAFT  2009  . CORONARY STENT PLACEMENT    . GASTRIC BYPASS  1983   open.  [place]  . MASTECTOMY Bilateral 1987     Current Outpatient Prescriptions  Medication Sig Dispense Refill  . albuterol (PROVENTIL HFA;VENTOLIN HFA) 108 (90 BASE) MCG/ACT inhaler Inhale 2 puffs into the lungs every 6 (six) hours as needed for shortness  of breath.     [name] Kitchen aspirin EC 81 MG tablet Take 81 mg by mouth daily.     . cefdinir (OMNICEF) 300 MG capsule Take 300 mg by mouth 2 (two) times daily.    . cetirizine (ZYRTEC) 10 MG tablet Take 10 mg by mouth daily as needed for allergies.    [name] Kitchen ergocalciferol (VITAMIN D2) 50000 units capsule Take 50,000 Units by mouth once a week.    [name] Kitchen glipiZIDE (GLUCOTROL XL) 10 MG 24 hr tablet Take 1 tablet (10 mg total) by mouth daily with breakfast. 30 tablet 9  . Hydrocodone-Acetaminophen (VICODIN HP) 10-300 MG TABS Take 1 tablet by mouth 2 (two) times daily as needed (pain).    [name] Kitchen latanoprost (XALATAN) 0.005 % ophthalmic solution Place 1 drop into both eyes at bedtime.    [name] Kitchen lisinopril-hydrochlorothiazide  (PRINZIDE,ZESTORETIC) 20-12.5 MG per tablet Take 1 tablet by mouth daily.      . Magnesium Oxide 250 MG TABS Take 1 tablet by mouth daily.    . metoprolol tartrate (LOPRESSOR) 25 MG tablet take 1 tablet by mouth twice a day 60 tablet 8  . montelukast (SINGULAIR) 10 MG tablet Take 10 mg by mouth daily.    . rosuvastatin (CRESTOR) 5 MG tablet Take 5 mg by mouth at bedtime.    . nitroGLYCERIN (NITROSTAT) 0.4 MG SL tablet Place 1 tablet (0.4 mg total) under the tongue every 5 (five) minutes as needed for chest pain. 25 tablet 3   No current facility-administered medications for this visit.     Allergies:   Latex    Social History:  The patient  reports that she quit smoking about 16 years ago. Her smoking use included Cigarettes. She has a 81.00 pack-year smoking history. She has never used smokeless tobacco. She reports that she does not drink alcohol or use drugs.   Family History:  The patient's family history includes Asthma in her sister; Colon cancer in her father; Colon cancer (age of onset: 31) in her sister; Diabetes in her mother; Heart disease in her father, mother, and sister; Hypertension in her mother; Stomach cancer in her sister.    ROS:  Please see the history of present illness.   Otherwise, review of systems are positive for none.   All other systems are reviewed and negative.    PHYSICAL EXAM: VS:  BP 124/84   Pulse (!) 50   Ht 4\' 5"  (1.346 m)   Wt 212 lb (96.2 kg)   SpO2 96%   BMI 53.06 kg/m  , BMI Body mass index is 53.06 kg/m. GEN: Well nourished, well developed, in no acute distress  HEENT: normal  Neck: no JVD, carotid bruits, or masses Cardiac: RRR; no murmurs, rubs, or gallops,no edema  Respiratory:  clear to auscultation bilaterally, normal work of breathing GI: soft, nontender, nondistended, + BS, overweight MS: no deformity or atrophy  Skin: warm and dry, no rash Neuro:  Strength and sensation are intact Psych: euthymic mood, full affect   EKG:   01/15/17 today sinus bradycardia rate 50 with nonspecific T-wave flattening, no other abnormalities personally viewed.   Recent Labs: 06/08/2016: ALT 18 10/06/2016: BUN 7; Creatinine, Ser 0.75; Hemoglobin 13.3; Platelets 193; Potassium 3.7; Sodium 139    Lipid Panel    Component Value Date/Time   CHOL  05/17/2009 0130    139        ATP III CLASSIFICATION:  <200     mg/dL   Desirable  200-239  mg/dL  Borderline High  >=240    mg/dL   High          TRIG 93 05/17/2009 0130   HDL 41 05/17/2009 0130   CHOLHDL 3.4 05/17/2009 0130   VLDL 19 05/17/2009 0130   LDLCALC  05/17/2009 0130    79        Total Cholesterol/HDL:CHD Risk Coronary Heart Disease Risk Table                     Men   Women  1/2 Average Risk   3.4   3.3  Average Risk       5.0   4.4  2 X Average Risk   9.6   7.1  3 X Average Risk  23.4   11.0        Use the calculated Patient Ratio above and the CHD Risk Table to determine the patient's CHD Risk.        ATP III CLASSIFICATION (LDL):  <100     mg/dL   Optimal  100-129  mg/dL   Near or Above                    Optimal  130-159  mg/dL   Borderline  160-189  mg/dL   High  >190     mg/dL   Very High      Wt Readings from Last 3 Encounters:  01/15/17 212 lb (96.2 kg)  11/16/16 212 lb (96.2 kg)  10/06/16 215 lb (97.5 kg)      Other studies Reviewed: Additional studies/ records that were reviewed today include: Prior medical records reviewed, lab work reviewed. Review of the above records demonstrates: as above  Cath 12/20/15:  Prox RCA to Mid RCA lesion, 55% stenosed. Mid RCA to Dist RCA lesion, 100% stenosed. Mild disease of the Ostial-Prox SVG-dRCA(filling PLA & PDA).  Mid LAD lesion, 100% stenosed. LIMA-LAD is patent, but very small to a small distal LAD. Very tortuous graft - not a good option to attempt PCI through.  Prox Cx to Mid Cx lesion, 100% stenosed. The lesion was previously treated with a stent (unknown type) greater than two years ago. Mid  Cx lesion, 100% stenosed. Patent SVG-OM2 & OM3.  Origin to Prox SVG-Diag Graft lesion, 100% stenosed. Known.  Sequential SVG-OM2-OM3 was injected is large & widely patent.  The left ventricular systolic function is normal.   No real change in anatomy from 2011 catheterization. No culprit lesion to explain the patient's stress test.  Normal EF with normal EDP and stable coronary arteries.  ASSESSMENT AND PLAN:  Coronary artery disease -Cath 12/20/15 reassuring, no ischemia, normal EF -Aspirin 81.  - Stable, no changes since then. Prior visit to ER atypical chest pain. Reassurance.  History of CABG bypass surgery 2007 -Cardiac catheterization as described above -Stable, reassuring  Hyperlipidemia -Currently on goal-directed therapy. Excellent. No myalgias.  - Occasional tingling in her right foot, likely neuropathy.  Obesity -Encourage weight loss -Gastric bypass (was 525 lbs at one point) - Has some residual abdominal discomfort at scar site she thinks. No evidence of cholecystitis, normal gallbladder ultrasound previously. Overall doing quite well.  Essential hypertension -No changes in medications. Well controlled.  Angina -Well-controlled with metoprolol, antianginal. Stable coronary disease as described above.  Former smoker -quit over 10 years ago.   Current medicines are reviewed at length with the patient today.  The patient does not have concerns regarding medicines.  The following changes have been  made:  no change  Labs/ tests ordered today include:  Orders Placed This Encounter  Procedures  . EKG 12-Lead     Disposition:   FU with Timberlynn Kizziah in 6 months  Signed, Candee Furbish, MD  01/15/2017 8:42 AM    [facility] [address], [place], [place]  [address] Phone: [phone]; Fax: [phone]

## 2017-01-22 ENCOUNTER — Ambulatory Visit: Payer: Medicare Other | Admitting: Cardiology

## 2017-03-07 ENCOUNTER — Encounter (HOSPITAL_COMMUNITY): Payer: Self-pay

## 2017-03-07 ENCOUNTER — Inpatient Hospital Stay (HOSPITAL_COMMUNITY): Payer: Medicare Other

## 2017-03-07 ENCOUNTER — Inpatient Hospital Stay (HOSPITAL_COMMUNITY)
Admission: EM | Admit: 2017-03-07 | Discharge: 2017-03-09 | DRG: 872 | Disposition: A | Discharge status: Home Health | Payer: Medicare Other | Attending: Internal Medicine | Admitting: Internal Medicine

## 2017-03-07 ENCOUNTER — Emergency Department (HOSPITAL_COMMUNITY): Payer: Medicare Other

## 2017-03-07 DIAGNOSIS — J45909 Unspecified asthma, uncomplicated: Secondary | ICD-10-CM | POA: Diagnosis present

## 2017-03-07 DIAGNOSIS — I2510 Atherosclerotic heart disease of native coronary artery without angina pectoris: Secondary | ICD-10-CM | POA: Diagnosis present

## 2017-03-07 DIAGNOSIS — G4730 Sleep apnea, unspecified: Secondary | ICD-10-CM | POA: Diagnosis present

## 2017-03-07 DIAGNOSIS — M549 Dorsalgia, unspecified: Secondary | ICD-10-CM | POA: Diagnosis present

## 2017-03-07 DIAGNOSIS — Z87891 Personal history of nicotine dependence: Secondary | ICD-10-CM | POA: Diagnosis not present

## 2017-03-07 DIAGNOSIS — K5901 Slow transit constipation: Secondary | ICD-10-CM | POA: Diagnosis present

## 2017-03-07 DIAGNOSIS — E119 Type 2 diabetes mellitus without complications: Secondary | ICD-10-CM

## 2017-03-07 DIAGNOSIS — Z90710 Acquired absence of both cervix and uterus: Secondary | ICD-10-CM

## 2017-03-07 DIAGNOSIS — N390 Urinary tract infection, site not specified: Secondary | ICD-10-CM | POA: Diagnosis not present

## 2017-03-07 DIAGNOSIS — Z951 Presence of aortocoronary bypass graft: Secondary | ICD-10-CM

## 2017-03-07 DIAGNOSIS — E785 Hyperlipidemia, unspecified: Secondary | ICD-10-CM | POA: Diagnosis present

## 2017-03-07 DIAGNOSIS — Z9010 Acquired absence of unspecified breast and nipple: Secondary | ICD-10-CM

## 2017-03-07 DIAGNOSIS — Z800 Family history of malignant neoplasm of digestive organs: Secondary | ICD-10-CM

## 2017-03-07 DIAGNOSIS — Z9884 Bariatric surgery status: Secondary | ICD-10-CM

## 2017-03-07 DIAGNOSIS — K5900 Constipation, unspecified: Secondary | ICD-10-CM | POA: Diagnosis present

## 2017-03-07 DIAGNOSIS — Z86010 Personal history of colonic polyps: Secondary | ICD-10-CM

## 2017-03-07 DIAGNOSIS — Z825 Family history of asthma and other chronic lower respiratory diseases: Secondary | ICD-10-CM

## 2017-03-07 DIAGNOSIS — Z7982 Long term (current) use of aspirin: Secondary | ICD-10-CM

## 2017-03-07 DIAGNOSIS — Z853 Personal history of malignant neoplasm of breast: Secondary | ICD-10-CM

## 2017-03-07 DIAGNOSIS — Z833 Family history of diabetes mellitus: Secondary | ICD-10-CM

## 2017-03-07 DIAGNOSIS — A419 Sepsis, unspecified organism: Principal | ICD-10-CM | POA: Diagnosis present

## 2017-03-07 DIAGNOSIS — I10 Essential (primary) hypertension: Secondary | ICD-10-CM | POA: Diagnosis present

## 2017-03-07 DIAGNOSIS — I252 Old myocardial infarction: Secondary | ICD-10-CM | POA: Diagnosis not present

## 2017-03-07 DIAGNOSIS — M609 Myositis, unspecified: Secondary | ICD-10-CM

## 2017-03-07 DIAGNOSIS — E1165 Type 2 diabetes mellitus with hyperglycemia: Secondary | ICD-10-CM | POA: Diagnosis present

## 2017-03-07 DIAGNOSIS — G8929 Other chronic pain: Secondary | ICD-10-CM | POA: Diagnosis present

## 2017-03-07 DIAGNOSIS — E860 Dehydration: Secondary | ICD-10-CM | POA: Diagnosis present

## 2017-03-07 DIAGNOSIS — Z7984 Long term (current) use of oral hypoglycemic drugs: Secondary | ICD-10-CM

## 2017-03-07 DIAGNOSIS — R300 Dysuria: Secondary | ICD-10-CM | POA: Diagnosis present

## 2017-03-07 DIAGNOSIS — Z79899 Other long term (current) drug therapy: Secondary | ICD-10-CM

## 2017-03-07 DIAGNOSIS — Z6841 Body Mass Index (BMI) 40.0 and over, adult: Secondary | ICD-10-CM | POA: Diagnosis not present

## 2017-03-07 DIAGNOSIS — Z8249 Family history of ischemic heart disease and other diseases of the circulatory system: Secondary | ICD-10-CM

## 2017-03-07 DIAGNOSIS — N12 Tubulo-interstitial nephritis, not specified as acute or chronic: Secondary | ICD-10-CM

## 2017-03-07 DIAGNOSIS — Z91040 Latex allergy status: Secondary | ICD-10-CM

## 2017-03-07 HISTORY — DX: Urinary tract infection, site not specified: N39.0

## 2017-03-07 LAB — GLUCOSE, CAPILLARY
GLUCOSE-CAPILLARY: 121 mg/dL — AB (ref 65–99)
Glucose-Capillary: 134 mg/dL — ABNORMAL HIGH (ref 65–99)

## 2017-03-07 LAB — APTT: aPTT: 22 seconds — ABNORMAL LOW (ref 24–36)

## 2017-03-07 LAB — CBC
HCT: 42.1 % (ref 36.0–46.0)
HEMOGLOBIN: 13.3 g/dL (ref 12.0–15.0)
MCH: 27.9 pg (ref 26.0–34.0)
MCHC: 31.6 g/dL (ref 30.0–36.0)
MCV: 88.3 fL (ref 78.0–100.0)
Platelets: 188 10*3/uL (ref 150–400)
RBC: 4.77 MIL/uL (ref 3.87–5.11)
RDW: 14.8 % (ref 11.5–15.5)
WBC: 7.3 10*3/uL (ref 4.0–10.5)

## 2017-03-07 LAB — CK: CK TOTAL: 308 U/L — AB (ref 38–234)

## 2017-03-07 LAB — COMPREHENSIVE METABOLIC PANEL
ALT: 14 U/L (ref 14–54)
ANION GAP: 7 (ref 5–15)
AST: 21 U/L (ref 15–41)
Albumin: 3.8 g/dL (ref 3.5–5.0)
Alkaline Phosphatase: 70 U/L (ref 38–126)
BUN: 14 mg/dL (ref 6–20)
CHLORIDE: 106 mmol/L (ref 101–111)
CO2: 24 mmol/L (ref 22–32)
Calcium: 9.3 mg/dL (ref 8.9–10.3)
Creatinine, Ser: 0.89 mg/dL (ref 0.44–1.00)
GFR calc non Af Amer: >60 mL/min (ref >60)
Glucose, Bld: 179 mg/dL — ABNORMAL HIGH (ref 65–99)
POTASSIUM: 4.3 mmol/L (ref 3.5–5.1)
SODIUM: 137 mmol/L (ref 135–145)
Total Bilirubin: 0.5 mg/dL (ref 0.3–1.2)
Total Protein: 6.5 g/dL (ref 6.5–8.1)

## 2017-03-07 LAB — URINALYSIS, ROUTINE W REFLEX MICROSCOPIC
Bacteria, UA: NONE SEEN
Bilirubin Urine: NEGATIVE
GLUCOSE, UA: NEGATIVE mg/dL
Hgb urine dipstick: NEGATIVE
KETONES UR: 5 mg/dL — AB
Nitrite: NEGATIVE
PROTEIN: NEGATIVE mg/dL
Specific Gravity, Urine: 1.024 (ref 1.005–1.030)
pH: 5 (ref 5.0–8.0)

## 2017-03-07 LAB — LACTIC ACID, PLASMA
LACTIC ACID, VENOUS: 1.6 mmol/L (ref 0.5–1.9)
LACTIC ACID, VENOUS: 2.2 mmol/L — AB (ref 0.5–1.9)
Lactic Acid, Venous: 1.8 mmol/L (ref 0.5–1.9)

## 2017-03-07 LAB — PROCALCITONIN: Procalcitonin: <0.1 ng/mL

## 2017-03-07 LAB — CBG MONITORING, ED: Glucose-Capillary: 140 mg/dL — ABNORMAL HIGH (ref 65–99)

## 2017-03-07 LAB — PROTIME-INR
INR: 1.12
PROTHROMBIN TIME: 14.5 s (ref 11.4–15.2)

## 2017-03-07 LAB — LIPASE, BLOOD: LIPASE: 37 U/L (ref 11–51)

## 2017-03-07 LAB — PHOSPHORUS: PHOSPHORUS: 3.6 mg/dL (ref 2.5–4.6)

## 2017-03-07 LAB — MAGNESIUM: MAGNESIUM: 1.9 mg/dL (ref 1.7–2.4)

## 2017-03-07 LAB — MRSA PCR SCREENING: MRSA by PCR: NEGATIVE

## 2017-03-07 MED ORDER — ENOXAPARIN SODIUM 40 MG/0.4ML ~~LOC~~ SOLN
40.0000 mg | SUBCUTANEOUS | Status: DC
  Administered 2017-03-07 – 2017-03-08 (×2): 40 mg via SUBCUTANEOUS
  Filled 2017-03-07 (×2): qty 0.4

## 2017-03-07 MED ORDER — MAGNESIUM HYDROXIDE 400 MG/5ML PO SUSP: 30.0000 mL | Freq: Once | ORAL | Status: DC

## 2017-03-07 MED ORDER — MAGNESIUM OXIDE 400 (241.3 MG) MG PO TABS
250.0000 mg | ORAL_TABLET | Freq: Every day | ORAL | Status: DC
  Administered 2017-03-07 – 2017-03-09 (×3): 200 mg via ORAL
  Filled 2017-03-07 (×3): qty 1

## 2017-03-07 MED ORDER — SODIUM CHLORIDE 0.9 % IV BOLUS (SEPSIS)
1000.0000 mL | Freq: Once | INTRAVENOUS | Status: AC
  Administered 2017-03-07: 1000 mL via INTRAVENOUS

## 2017-03-07 MED ORDER — SODIUM CHLORIDE 0.9% FLUSH
3.0000 mL | Freq: Two times a day (BID) | INTRAVENOUS | Status: DC
  Administered 2017-03-07 – 2017-03-09 (×4): 3 mL via INTRAVENOUS

## 2017-03-07 MED ORDER — POLYETHYLENE GLYCOL 3350 17 G PO PACK
17.0000 g | PACK | Freq: Every day | ORAL | Status: DC
  Administered 2017-03-08: 17 g via ORAL
  Filled 2017-03-07 (×2): qty 1

## 2017-03-07 MED ORDER — FENTANYL CITRATE (PF) 100 MCG/2ML IJ SOLN
100.0000 ug | Freq: Once | INTRAMUSCULAR | Status: AC
  Administered 2017-03-07: 100 ug via INTRAVENOUS
  Filled 2017-03-07: qty 2

## 2017-03-07 MED ORDER — ONDANSETRON HCL 4 MG PO TABS: 4.0000 mg | ORAL_TABLET | Freq: Four times a day (QID) | ORAL | Status: DC | PRN

## 2017-03-07 MED ORDER — PIPERACILLIN-TAZOBACTAM 3.375 G IVPB 30 MIN
3.3750 g | Freq: Once | INTRAVENOUS | Status: AC
  Administered 2017-03-07: 3.375 g via INTRAVENOUS
  Filled 2017-03-07: qty 50

## 2017-03-07 MED ORDER — FOSFOMYCIN TROMETHAMINE 3 G PO PACK
3.0000 g | PACK | Freq: Once | ORAL | Status: DC
  Filled 2017-03-07: qty 3

## 2017-03-07 MED ORDER — HYDROCODONE-ACETAMINOPHEN 10-325 MG PO TABS
1.0000 | ORAL_TABLET | ORAL | Status: DC | PRN
  Administered 2017-03-08 – 2017-03-09 (×2): 1 via ORAL
  Filled 2017-03-07 (×2): qty 1

## 2017-03-07 MED ORDER — INSULIN ASPART 100 UNIT/ML ~~LOC~~ SOLN
0.0000 [IU] | Freq: Three times a day (TID) | SUBCUTANEOUS | Status: DC
  Administered 2017-03-07 – 2017-03-08 (×4): 1 [IU] via SUBCUTANEOUS

## 2017-03-07 MED ORDER — LORATADINE 10 MG PO TABS
10.0000 mg | ORAL_TABLET | Freq: Every day | ORAL | Status: DC | PRN
  Administered 2017-03-08: 10 mg via ORAL
  Filled 2017-03-07: qty 1

## 2017-03-07 MED ORDER — ERGOCALCIFEROL 1.25 MG (50000 UT) PO CAPS: 50000.0000 [IU] | ORAL_CAPSULE | ORAL | Status: DC

## 2017-03-07 MED ORDER — HYDROMORPHONE HCL 1 MG/ML IJ SOLN
1.0000 mg | Freq: Once | INTRAMUSCULAR | Status: AC
  Administered 2017-03-07: 1 mg via INTRAMUSCULAR
  Filled 2017-03-07: qty 1

## 2017-03-07 MED ORDER — MONTELUKAST SODIUM 10 MG PO TABS
10.0000 mg | ORAL_TABLET | Freq: Every day | ORAL | Status: DC
  Administered 2017-03-07 – 2017-03-09 (×3): 10 mg via ORAL
  Filled 2017-03-07 (×3): qty 1

## 2017-03-07 MED ORDER — DOCUSATE SODIUM 100 MG PO CAPS
100.0000 mg | ORAL_CAPSULE | Freq: Two times a day (BID) | ORAL | Status: DC
  Administered 2017-03-07 – 2017-03-09 (×4): 100 mg via ORAL
  Filled 2017-03-07 (×4): qty 1

## 2017-03-07 MED ORDER — LATANOPROST 0.005 % OP SOLN
1.0000 [drp] | Freq: Every day | OPHTHALMIC | Status: DC
  Administered 2017-03-07 – 2017-03-08 (×2): 1 [drp] via OPHTHALMIC
  Filled 2017-03-07 (×2): qty 2.5

## 2017-03-07 MED ORDER — INSULIN ASPART 100 UNIT/ML ~~LOC~~ SOLN
0.0000 [IU] | Freq: Every day | SUBCUTANEOUS | Status: DC
  Filled 2017-03-07: qty 1

## 2017-03-07 MED ORDER — SODIUM CHLORIDE 0.9 % IV SOLN
1.0000 g | Freq: Three times a day (TID) | INTRAVENOUS | Status: DC
  Administered 2017-03-07 – 2017-03-09 (×7): 1 g via INTRAVENOUS
  Filled 2017-03-07 (×10): qty 1

## 2017-03-07 MED ORDER — ALBUTEROL SULFATE (2.5 MG/3ML) 0.083% IN NEBU: 3.0000 mL | INHALATION_SOLUTION | Freq: Four times a day (QID) | RESPIRATORY_TRACT | Status: DC | PRN

## 2017-03-07 MED ORDER — GLIPIZIDE ER 10 MG PO TB24
10.0000 mg | ORAL_TABLET | Freq: Every day | ORAL | Status: DC
  Administered 2017-03-08 – 2017-03-09 (×2): 10 mg via ORAL
  Filled 2017-03-07 (×2): qty 1

## 2017-03-07 MED ORDER — ONDANSETRON HCL 4 MG/2ML IJ SOLN: 4.0000 mg | Freq: Four times a day (QID) | INTRAMUSCULAR | Status: DC | PRN

## 2017-03-07 MED ORDER — ROSUVASTATIN CALCIUM 10 MG PO TABS
5.0000 mg | ORAL_TABLET | Freq: Every day | ORAL | Status: DC
  Administered 2017-03-07 – 2017-03-08 (×2): 5 mg via ORAL
  Filled 2017-03-07 (×2): qty 1

## 2017-03-07 MED ORDER — ASPIRIN EC 81 MG PO TBEC
81.0000 mg | DELAYED_RELEASE_TABLET | Freq: Every day | ORAL | Status: DC
  Administered 2017-03-07 – 2017-03-09 (×3): 81 mg via ORAL
  Filled 2017-03-07 (×3): qty 1

## 2017-03-07 NOTE — ED Triage Notes (Signed)
Pt complaining of lower abdominal pain that radiates to R flank. Pt denies any N/V/D. Pt denies any urinary symptoms. Pt denies any vaginal bleeding or discharge.

## 2017-03-07 NOTE — H&P (Signed)
History and Physical    Patricia Cuevas DPO:[id number] DOB: 10-17-1950 DOA: 03/07/2017   PCP: [name], MD Althia Forts  Patient coming from/Resides with: Private residence/son  Chief Complaint: Urinary frequency  HPI: Patricia Cuevas is a 66 y.o. female with medical history significant for CAD and prior CABG, hypertension, asthma, sleep apnea and obesity, dyslipidemia, diabetes on oral agents and recurrent MDR Escherichia coli UTI. Patient reports 1 week of dysuria and UTI symptoms. She was given an oral antibiotic by her PCP (?? Omnicef) without improvement in symptoms. She has now developed significant right lower quadrant/right flank pain. CT renal study without urolithiasis or any other obstructive uropathy or acute intra-abdominal/pelvic process as well as a normal appendix. Her urinalysis was bland but as noted she has received oral antibiotics prior to presentation. Just prior to my evaluation of the patient she had sustained drop in systolic blood pressure in the 70-80 range which has since improved without treatment. She will be admitted for sepsis presumed secondary to UTI.  ED Course:  Vital Signs: BP (!) 80/40 (BP Location: Right Arm) Comment: Dr. Regenia Skeeter aware.  Will order IV fluids.  Pulse 77   Temp 98.7 F (37.1 C) (Oral)   Resp 17   SpO2 98%  CT renal stone study: As above Lab data: Sodium 137, potassium 4.3, chloride 106, CO2 24, glucose 179, BUN 14, creatinine 0.89, LFTs normal, white count 7300 differential not obtained, hemoglobin 13.3, platelets 188,000, urinalysis is 5 ketones, moderate leukocytes otherwise unremarkable Medications and treatments: Dilaudid 1 mg IM 1, Zosyn 3.375 g IV 1, fentanyl 100 g IV 1  Review of Systems:  In addition to the HPI above,  No Fever-chills, myalgias or other constitutional symptoms No Headache, changes with Vision or hearing, new weakness, tingling, numbness in any extremity, dizziness, dysarthria or word finding difficulty,  gait disturbance or imbalance, tremors or seizure activity No problems swallowing food or Liquids, indigestion/reflux, choking or coughing while eating, abdominal pain with or after eating No Chest pain, Cough or Shortness of Breath, palpitations, orthopnea or DOE No N/V, melena,hematochezia, dark tarry stools, constipation No hematuria No new skin rashes, lesions, masses or bruises, No new joint pains, aches, swelling or redness No recent unintentional weight gain or loss No polyuria, polydypsia or polyphagia   Past Medical History:  Diagnosis Date  . Alcohol abuse    in the past, resolved  . Arthritis   . Asthma   . Blood transfusion without reported diagnosis 1984   after surgery  . Breast cancer [facility])    Double mastectomy 2007; s/p tamoxifen and arimidex therapy; no recurrence since 05/2008  . CAD (coronary artery disease)    Nuclear stress test Feb 11, 2011, EF 64%, no scar or ischemia    //         catheterization, November, 2011,patent LIMA-LAD-small caliber distal vessel with diffuse plaque, patent SVG to OM1 and OM 2, patent SVG to PDA and PLA, occluded SVG to diagonal, medical therapy;  Lexiscan Myoview 6/14:  Inferior thinning, no ischemia, EF 61%, low risk  . CHF (congestive heart failure) ([place])   . Chronic back pain   . Diabetes mellitus   . Dyslipidemia   . Ejection fraction    EF 65%, echo, [place], Jan 16, 2011  . Hx of CABG    2007  . Hx of colonic polyps   . Hyperlipidemia   . Hypertension   . Myocardial infarction ([place])   . Nausea & vomiting  hospitalization, November, 2011, stricture GE junction, questionable stenosis gastrojejunostomy, disruptive primary peristaltic wave, GE reflux, delayed emptying proximal gastric pouch  . Normal cardiac stress test 02/2013  . Overweight(278.02)    stomach stapling 1985 followed by weight loss, then return of weight  . Sciatica   . Sleep apnea    CPAP being arranged May, 2011//does not use CPAP  . Tobacco abuse     in the past, resolved    Past Surgical History:  Procedure Laterality Date  . ABDOMINAL HYSTERECTOMY  1989  . CARDIAC CATHETERIZATION N/A 12/20/2015   Procedure: Left Heart Cath and Cors/Grafts Angiography;  Surgeon: Leonie Man, MD;  Location: [place] [facility];  Service: Cardiovascular;  Laterality: N/A;  . CORONARY ARTERY BYPASS GRAFT  2009  . CORONARY STENT PLACEMENT    . GASTRIC BYPASS  1983   open.  [place]  . MASTECTOMY Bilateral 1987    Social History   Social History  . Marital status: Single    Spouse name: N/A  . Number of children: N/A  . Years of education: N/A   Occupational History  . Disabled    Social History Main Topics  . Smoking status: Former Smoker    Packs/day: 3.00    Years: 27.00    Types: Cigarettes    Quit date: 09/14/2000  . Smokeless tobacco: Never Used  . Alcohol use No  . Drug use: No  . Sexual activity: Not on file   Other Topics Concern  . Not on file   Social History Narrative  . No narrative on file    Mobility: Cane and rolling walker Work history: Retired   Allergies  Allergen Reactions  . Latex Itching and Rash    Family History  Problem Relation Age of Onset  . Hypertension Mother   . Heart disease Mother   . Diabetes Mother   . Heart disease Father   . Colon cancer Father        does not know age of onset  . Heart disease Sister   . Colon cancer Sister 33  . Stomach cancer Sister   . Asthma Sister   . Esophageal cancer Neg Hx   . Rectal cancer Neg Hx     Prior to Admission medications   Medication Sig Start Date End Date Taking? Authorizing Provider  albuterol (PROVENTIL HFA;VENTOLIN HFA) 108 (90 BASE) MCG/ACT inhaler Inhale 2 puffs into the lungs every 6 (six) hours as needed for shortness of breath.     [provider]  aspirin EC 81 MG tablet Take 81 mg by mouth daily.     [provider]  cefdinir (OMNICEF) 300 MG capsule Take 300 mg by mouth 2 (two) times daily.    [provider]  cetirizine (ZYRTEC) 10 MG tablet Take 10 mg by mouth daily as needed for allergies.    [provider]  ergocalciferol (VITAMIN D2) 50000 units capsule Take 50,000 Units by mouth once a week.    [provider]  glipiZIDE (GLUCOTROL XL) 10 MG 24 hr tablet Take 1 tablet (10 mg total) by mouth daily with breakfast. 12/17/15   [name], NP  Hydrocodone-Acetaminophen (VICODIN HP) 10-300 MG TABS Take 1 tablet by mouth 2 (two) times daily as needed (pain).    [provider]  latanoprost (XALATAN) 0.005 % ophthalmic solution Place 1 drop into both eyes at bedtime.    [provider]  lisinopril-hydrochlorothiazide (PRINZIDE,ZESTORETIC) 20-12.5 MG per tablet Take  1 tablet by mouth daily.      [provider]  Magnesium Oxide 250 MG TABS Take 1 tablet by mouth daily.    [provider]  metoprolol tartrate (LOPRESSOR) 25 MG tablet take 1 tablet by mouth twice a day 07/09/16   [name], MD  montelukast (SINGULAIR) 10 MG tablet Take 10 mg by mouth daily.    [provider]  nitroGLYCERIN (NITROSTAT) 0.4 MG SL tablet Place 1 tablet (0.4 mg total) under the tongue every 5 (five) minutes as needed for chest pain. 01/15/17   [name], MD  rosuvastatin (CRESTOR) 5 MG tablet Take 5 mg by mouth at bedtime.    [provider]    Physical Exam: Vitals:   03/07/17 0430 03/07/17 0445 03/07/17 0615 03/07/17 0715  BP: 132/80 135/79 (!) 103/52 (!) 80/40  Pulse: 74 61 77   Resp: (!) 22 18 17    Temp:      TempSrc:      SpO2: 95% 96% 98%       Constitutional: NAD, calm, uncomfortable 2/2 ongoing right-sided abdominal/flank pain Eyes: PERRL, lids and conjunctivae normal ENMT: Mucous membranes are moist. Posterior pharynx clear of any exudate or lesions.Normal dentition.  Neck: normal, supple, no masses, no thyromegaly Respiratory: clear to auscultation bilaterally, no wheezing, no crackles. Normal respiratory  effort. No accessory muscle use.  Cardiovascular: Regular rate and rhythm, no murmurs / rubs / gallops. No extremity edema. 2+ pedal pulses. No carotid bruits.  Abdomen: Focal tenderness right lower abdomen that radiates to the flank region with palpation, no masses palpated. No hepatosplenomegaly. Bowel sounds positive.  Musculoskeletal: no clubbing / cyanosis. No joint deformity upper and lower extremities. Good ROM, no contractures. Normal muscle tone.  Skin: no rashes, lesions, ulcers. No induration Neurologic: CN 2-12 grossly intact. Sensation intact, DTR normal. Strength 5/5 x all 4 extremities.  Psychiatric: Normal judgment and insight. Alert and oriented x 3. Normal mood.    Labs on Admission: I have personally reviewed following labs and imaging studies  CBC:  Recent Labs Lab 03/07/17 0026  WBC 7.3  HGB 13.3  HCT 42.1  MCV 88.3  PLT 672   Basic Metabolic Panel:  Recent Labs Lab 03/07/17 0026  NA 137  K 4.3  CL 106  CO2 24  GLUCOSE 179*  BUN 14  CREATININE 0.89  CALCIUM 9.3   GFR: CrCl cannot be calculated (Unknown ideal weight.). Liver Function Tests:  Recent Labs Lab 03/07/17 0026  AST 21  ALT 14  ALKPHOS 70  BILITOT 0.5  PROT 6.5  ALBUMIN 3.8    Recent Labs Lab 03/07/17 0026  LIPASE 37   No results for input(s): AMMONIA in the last 168 hours. Coagulation Profile: No results for input(s): INR, PROTIME in the last 168 hours. Cardiac Enzymes: No results for input(s): CKTOTAL, CKMB, CKMBINDEX, TROPONINI in the last 168 hours. BNP (last 3 results) No results for input(s): PROBNP in the last 8760 hours. HbA1C: No results for input(s): HGBA1C in the last 72 hours. CBG: No results for input(s): GLUCAP in the last 168 hours. Lipid Profile: No results for input(s): CHOL, HDL, LDLCALC, TRIG, CHOLHDL, LDLDIRECT in the last 72 hours. Thyroid Function Tests: No results for input(s): TSH, T4TOTAL, FREET4, T3FREE, THYROIDAB in the last 72  hours. Anemia Panel: No results for input(s): VITAMINB12, FOLATE, FERRITIN, TIBC, IRON, RETICCTPCT in the last 72 hours. Urine analysis:    Component Value Date/Time   COLORURINE YELLOW 03/07/2017 0028  APPEARANCEUR CLEAR 03/07/2017 0028   LABSPEC 1.024 03/07/2017 0028   PHURINE 5.0 03/07/2017 0028   GLUCOSEU NEGATIVE 03/07/2017 0028   HGBUR NEGATIVE 03/07/2017 0028   BILIRUBINUR NEGATIVE 03/07/2017 0028   KETONESUR 5 (A) 03/07/2017 0028   PROTEINUR NEGATIVE 03/07/2017 0028   UROBILINOGEN 1.0 03/31/2015 0905   NITRITE NEGATIVE 03/07/2017 0028   LEUKOCYTESUR MODERATE (A) 03/07/2017 0028   Sepsis Labs: @LABRCNTIP (procalcitonin:4,lacticidven:4) )No results found for this or any previous visit (from the past 240 hour(s)).   Radiological Exams on Admission: Ct Renal Stone Study  Result Date: 03/07/2017 CLINICAL DATA:  Lower abdominal pain radiating to RIGHT flank. History of diabetes, colonic polyps, breast cancer. EXAM: CT ABDOMEN AND PELVIS WITHOUT CONTRAST TECHNIQUE: Multidetector CT imaging of the abdomen and pelvis was performed following the standard protocol without IV contrast. COMPARISON:  CT abdomen and pelvis June 08, 2016 FINDINGS: LOWER CHEST: Status post median sternotomy. Severe coronary artery calcification versus stents. RIGHT lung base atelectasis. Heart size is normal. No pericardial effusion. Mildly elevated RIGHT hemidiaphragm. HEPATOBILIARY: Normal. PANCREAS: Normal. SPLEEN: Normal. ADRENALS/URINARY TRACT: Kidneys are orthotopic, demonstrating normal size and morphology. No nephrolithiasis, hydronephrosis; limited assessment for renal masses on this nonenhanced examination. The unopacified ureters are normal in course and caliber. Urinary bladder is partially distended and unremarkable. Normal adrenal glands. STOMACH/BOWEL: Status post gastric bypass. Stomach, small and large bowel are normal in course and caliber, normal appendix. Sensitivity for bowel pathology  decreased without oral contrast. VASCULAR/LYMPHATIC: Aortoiliac vessels are normal in course and caliber. No lymphadenopathy by CT size criteria. REPRODUCTIVE: Status post hysterectomy. OTHER: No intraperitoneal free fluid or free air. MUSCULOSKELETAL: Non-acute. Postsurgical changes anterior abdominal wall. RIGHT focal flank soft tissue stranding Ing gas consistent with injection site. Multilevel severe degenerative change of the thoracic spine and L5-S1. IMPRESSION: No urolithiasis, obstructive uropathy nor acute intra-abdominal/pelvic process. Normal appendix. Electronically Signed   By: Elon Alas M.D.   On: 03/07/2017 04:26     Assessment/Plan Principal Problem:   Sepsis due to urinary tract infection  -Patient presents with dysuria and right-sided abdominal/flank pain in setting of likely UTI with known history of multidrug resistant Escherichia coli on repeated urine cultures -Had episode of sustained systolic hypotension and with suspected gram-negative infection we'll treat as sepsis as additional data being gathered-urine bland in setting of preadmission antibiotic therapy  -Obtain urine culture and blood cultures -Obtain lactic acid and Procalcitonin -Empiric antibiotic therapy with meropenem -Initiate sepsis order set to include fluid bolus given recent episodic hypotension -Admit to stepdown bed  Active Problems:   Sleep apnea -Intolerant to CPAP -Follow pulse oximetry especially while sleeping    Diabetes mellitus type 2, controlled  -Continue Glucotrol -SSI -HgbA1c -Recent serum glucose 179   Constipation w/ stool burden -Begin laxatives and stool softeners    CAD (coronary artery disease) with prior CABG -Last cardiac catheterization April 2017: Normal EF with normal EDP and stable coronary arteries. -On beta blocker, aspirin and statin prior to admission -Currently asymptomatic but monitor closely if has continued issues with hypotension    HTN  (hypertension) -Current blood pressure suboptimal so holding preadmission Lopressor, lisinopril with hydrochlorothiazide    Asthma -Stable and asymptomatic -Continue albuterol MDI, prn cetirizine and continue Singulair    Hyperlipidemia -Continue Crestor      DVT prophylaxis: Lovenox Code Status: Full Family Communication: No family at bedside Disposition Plan: Home Consults called: None     Atoya Andrew L. ANP-BC [facility] Pager [phone]   If 7PM-7AM, please contact  night-coverage [email or web address] Password [facility]  03/07/2017, 7:57 AM

## 2017-03-07 NOTE — ED Notes (Signed)
Ordered pt a diet tray, attempted 2nd iv x 2 with no success, will call iv team

## 2017-03-07 NOTE — Progress Notes (Signed)
I text paged Erin Hearing, NP that lactic acid 2.2

## 2017-03-07 NOTE — ED Notes (Signed)
Iv team at bedside  

## 2017-03-07 NOTE — Progress Notes (Addendum)
CK elevated at 308. CT for renal stone in the ER question possible right groin/flank myositis therefore will obtain MR of pelvis to better clarify. Lactic acid has increased to 2.2 so continue to trend.  Erin Hearing, ANP

## 2017-03-07 NOTE — Progress Notes (Signed)
Pharmacy Antibiotic Note  GINNI EICHLER is a 66 y.o. female with h/o ESBL E. Coli UTI admitted on 03/07/2017 with possible pyleonephritis .  Pharmacy has been consulted for Meropenem dosing.  Plan: Meropenem 1 g IV q8h    Temp (24hrs), Avg:98.7 F (37.1 C), Min:98.7 F (37.1 C), Max:98.7 F (37.1 C)   Recent Labs Lab 03/07/17 0026  WBC 7.3  CREATININE 0.89    CrCl cannot be calculated (Unknown ideal weight.).    Allergies  Allergen Reactions  . Latex Itching and Rash    Caryl Pina 03/07/2017 7:29 AM

## 2017-03-07 NOTE — ED Notes (Signed)
Patient transported to CT 

## 2017-03-07 NOTE — ED Provider Notes (Signed)
[place] DEPT Provider Note   CSN: [id number] Arrival date & time: 03/07/17  0002  By signing my name below, I, Ny'Kea Lewis, attest that this documentation has been prepared under the direction and in the presence of Sherwood Gambler, MD. Electronically Signed: Lise Auer, ED Scribe. 03/07/17. 3:33 AM.  History   Chief Complaint Chief Complaint  Patient presents with  . Abdominal Pain   The history is provided by the patient. No language interpreter was used.    HPI Comments: Patricia Cuevas is a 66 y.o. female with a history of alchol abuse, asthma, CAD, CHF, DM, and MI who presents to the Emergency Department complaining of sudden onset, persistent right lower quadrant abdominal pain that radiates into her lower back that began at 9 pm last night. Pt notes one week of urinary frequency. She reports a hx of nephrolithiasis, but states the pain is not similar. Pt took Vicodin @ 10 pm with no relief. Denies hematuria, dysuria, nausea, emesis, diarrhea, or fever.   Past Medical History:  Diagnosis Date  . Alcohol abuse    in the past, resolved  . Arthritis   . Asthma   . Blood transfusion without reported diagnosis 1984   after surgery  . Breast cancer [facility])    Double mastectomy 2007; s/p tamoxifen and arimidex therapy; no recurrence since 05/2008  . CAD (coronary artery disease)    Nuclear stress test Feb 11, 2011, EF 64%, no scar or ischemia    //         catheterization, November, 2011,patent LIMA-LAD-small caliber distal vessel with diffuse plaque, patent SVG to OM1 and OM 2, patent SVG to PDA and PLA, occluded SVG to diagonal, medical therapy;  Lexiscan Myoview 6/14:  Inferior thinning, no ischemia, EF 61%, low risk  . CHF (congestive heart failure) ([place])   . Chronic back pain   . Diabetes mellitus   . Dyslipidemia   . Ejection fraction    EF 65%, echo, [place], Jan 16, 2011  . Hx of CABG    2007  . Hx of colonic polyps   . Hyperlipidemia   . Hypertension   .  Myocardial infarction ([place])   . Nausea & vomiting    hospitalization, November, 2011, stricture GE junction, questionable stenosis gastrojejunostomy, disruptive primary peristaltic wave, GE reflux, delayed emptying proximal gastric pouch  . Normal cardiac stress test 02/2013  . Overweight(278.02)    stomach stapling 1985 followed by weight loss, then return of weight  . Sciatica   . Sleep apnea    CPAP being arranged May, 2011//does not use CPAP  . Tobacco abuse    in the past, resolved   Patient Active Problem List   Diagnosis Date Noted  . Abnormal nuclear stress test 12/20/2015  . Angina, class III ([place]) 12/20/2015  . Chest pain 11/12/2014  . Upper GI bleed 03/04/2013  . Atypical chest pain 01/07/2013  . History of tobacco abuse 01/07/2013  . SIRS (systemic inflammatory response syndrome) ([place]) 07/28/2012  . Tachycardia 07/28/2012  . GERD (gastroesophageal reflux disease) 02/24/2011  . Coronary artery disease involving native coronary artery with angina pectoris ([place])   . Hx of CABG   . Overweight   . OSA (obstructive sleep apnea)   . Nausea & vomiting   . Breast cancer ([place])   . Hypertension   . Diabetes mellitus ([place])   . Dyslipidemia   . Ejection fraction    Past Surgical History:  Procedure Laterality Date  . ABDOMINAL  HYSTERECTOMY  1989  . CARDIAC CATHETERIZATION N/A 12/20/2015   Procedure: Left Heart Cath and Cors/Grafts Angiography;  Surgeon: Leonie Man, MD;  Location: [facility];  Service: Cardiovascular;  Laterality: N/A;  . CORONARY ARTERY BYPASS GRAFT  2009  . CORONARY STENT PLACEMENT    . GASTRIC BYPASS  1983   open.  [place]  . MASTECTOMY Bilateral 1987   OB History    No data available     Home Medications    Prior to Admission medications   Medication Sig Start Date End Date Taking? Authorizing Provider  albuterol (PROVENTIL HFA;VENTOLIN HFA) 108 (90 BASE) MCG/ACT inhaler Inhale 2 puffs into the lungs every 6 (six) hours as needed  for shortness of breath.     [provider]  aspirin EC 81 MG tablet Take 81 mg by mouth daily.     [provider]  cefdinir (OMNICEF) 300 MG capsule Take 300 mg by mouth 2 (two) times daily.    [provider]  cetirizine (ZYRTEC) 10 MG tablet Take 10 mg by mouth daily as needed for allergies.    [provider]  ergocalciferol (VITAMIN D2) 50000 units capsule Take 50,000 Units by mouth once a week.    [provider]  glipiZIDE (GLUCOTROL XL) 10 MG 24 hr tablet Take 1 tablet (10 mg total) by mouth daily with breakfast. 12/17/15   [name], NP  Hydrocodone-Acetaminophen (VICODIN HP) 10-300 MG TABS Take 1 tablet by mouth 2 (two) times daily as needed (pain).    [provider]  latanoprost (XALATAN) 0.005 % ophthalmic solution Place 1 drop into both eyes at bedtime.    [provider]  lisinopril-hydrochlorothiazide (PRINZIDE,ZESTORETIC) 20-12.5 MG per tablet Take 1 tablet by mouth daily.      [provider]  Magnesium Oxide 250 MG TABS Take 1 tablet by mouth daily.    [provider]  metoprolol tartrate (LOPRESSOR) 25 MG tablet take 1 tablet by mouth twice a day 07/09/16   [name], MD  montelukast (SINGULAIR) 10 MG tablet Take 10 mg by mouth daily.    [provider]  nitroGLYCERIN (NITROSTAT) 0.4 MG SL tablet Place 1 tablet (0.4 mg total) under the tongue every 5 (five) minutes as needed for chest pain. 01/15/17   [name], MD  rosuvastatin (CRESTOR) 5 MG tablet Take 5 mg by mouth at bedtime.    [provider]   Family History Family History  Problem Relation Age of Onset  . Hypertension Mother   . Heart disease Mother   . Diabetes Mother   . Heart disease Father   . Colon cancer Father        does not know age of onset  . Heart disease Sister   . Colon cancer Sister 70  . Stomach cancer Sister   . Asthma Sister   . Esophageal cancer Neg Hx   . Rectal cancer Neg Hx      Social History Social History  Substance Use Topics  . Smoking status: Former Smoker    Packs/day: 3.00    Years: 27.00    Types: Cigarettes    Quit date: 09/14/2000  . Smokeless tobacco: Never Used  . Alcohol use No   Allergies   Latex  Review of Systems Review of Systems  Constitutional: Negative for fever.  Gastrointestinal: Positive for abdominal pain. Negative for diarrhea, nausea and vomiting.  Genitourinary: Negative for dysuria and hematuria.  Musculoskeletal:  Positive for back pain.  All other systems reviewed and are negative.  Physical Exam Updated Vital Signs BP (!) 80/40 (BP Location: Right Arm) Comment: Dr. Regenia Skeeter aware.  Will order IV fluids.  Pulse 77   Temp 98.7 F (37.1 C) (Oral)   Resp 17   SpO2 98%   Physical Exam  Constitutional: She is oriented to person, place, and time. She appears well-developed and well-nourished.  HENT:  Head: Normocephalic and atraumatic.  Right Ear: External ear normal.  Left Ear: External ear normal.  Nose: Nose normal.  Eyes: Right eye exhibits no discharge. Left eye exhibits no discharge.  Cardiovascular: Normal rate, regular rhythm and normal heart sounds.   Pulmonary/Chest: Effort normal and breath sounds normal.  Abdominal: Soft. There is tenderness (RLQ tenderness.).  Musculoskeletal: She exhibits tenderness.  Right lateral lumbar tenderness.   Neurological: She is alert and oriented to person, place, and time.  Skin: Skin is warm and dry.  Nursing note and vitals reviewed.  ED Treatments / Results  DIAGNOSTIC STUDIES: Oxygen Saturation is 100% on RA, normal by my interpretation.   COORDINATION OF CARE: 3:27 AM-Discussed next steps with pt. Pt verbalized understanding and is agreeable with the plan. ' Labs (all labs ordered are listed, but only abnormal results are displayed) Labs Reviewed  COMPREHENSIVE METABOLIC PANEL - Abnormal; Notable for the following:       Result Value   Glucose, Bld 179 (*)     All other components within normal limits  URINALYSIS, ROUTINE W REFLEX MICROSCOPIC - Abnormal; Notable for the following:    Ketones, ur 5 (*)    Leukocytes, UA MODERATE (*)    Squamous Epithelial / LPF 0-5 (*)    All other components within normal limits  URINE CULTURE  CULTURE, BLOOD (ROUTINE X 2)  CULTURE, BLOOD (ROUTINE X 2)  LIPASE, BLOOD  CBC  LACTIC ACID, PLASMA  LACTIC ACID, PLASMA  PROCALCITONIN  PROTIME-INR  APTT    EKG  EKG Interpretation None      Radiology Ct Renal Stone Study  Result Date: 03/07/2017 CLINICAL DATA:  Lower abdominal pain radiating to RIGHT flank. History of diabetes, colonic polyps, breast cancer. EXAM: CT ABDOMEN AND PELVIS WITHOUT CONTRAST TECHNIQUE: Multidetector CT imaging of the abdomen and pelvis was performed following the standard protocol without IV contrast. COMPARISON:  CT abdomen and pelvis June 08, 2016 FINDINGS: LOWER CHEST: Status post median sternotomy. Severe coronary artery calcification versus stents. RIGHT lung base atelectasis. Heart size is normal. No pericardial effusion. Mildly elevated RIGHT hemidiaphragm. HEPATOBILIARY: Normal. PANCREAS: Normal. SPLEEN: Normal. ADRENALS/URINARY TRACT: Kidneys are orthotopic, demonstrating normal size and morphology. No nephrolithiasis, hydronephrosis; limited assessment for renal masses on this nonenhanced examination. The unopacified ureters are normal in course and caliber. Urinary bladder is partially distended and unremarkable. Normal adrenal glands. STOMACH/BOWEL: Status post gastric bypass. Stomach, small and large bowel are normal in course and caliber, normal appendix. Sensitivity for bowel pathology decreased without oral contrast. VASCULAR/LYMPHATIC: Aortoiliac vessels are normal in course and caliber. No lymphadenopathy by CT size criteria. REPRODUCTIVE: Status post hysterectomy. OTHER: No intraperitoneal free fluid or free air. MUSCULOSKELETAL: Non-acute. Postsurgical changes  anterior abdominal wall. RIGHT focal flank soft tissue stranding Ing gas consistent with injection site. Multilevel severe degenerative change of the thoracic spine and L5-S1. IMPRESSION: No urolithiasis, obstructive uropathy nor acute intra-abdominal/pelvic process. Normal appendix. Electronically Signed   By: Elon Alas M.D.   On: 03/07/2017 04:26    Procedures Procedures (including critical care time)  Medications Ordered in ED Medications  sodium chloride 0.9 % bolus 1,000 mL (not administered)  sodium chloride 0.9 % bolus 1,000 mL (not administered)    And  sodium chloride 0.9 % bolus 1,000 mL (not administered)    And  sodium chloride 0.9 % bolus 1,000 mL (not administered)  meropenem (MERREM) 1 g in sodium chloride 0.9 % 100 mL IVPB (not administered)  HYDROmorphone (DILAUDID) injection 1 mg (1 mg Intramuscular Given 03/07/17 0337)  piperacillin-tazobactam (ZOSYN) IVPB 3.375 g (0 g Intravenous Stopped 03/07/17 0620)  fentaNYL (SUBLIMAZE) injection 100 mcg (100 mcg Intravenous Given 03/07/17 0533)    Initial Impression / Assessment and Plan / ED Course  I have reviewed the triage vital signs and the nursing notes.  Pertinent labs & imaging results that were available during my care of the patient were reviewed by me and considered in my medical decision making (see chart for details).     Patient's urine shows moderate leukocytes. Given her urinary frequency and urgency this could be consistent with UTI. Past urinary cultures show Escherichia coli that is consistently  resistant to multiple typical treatment options. Of the available options, IV Zosyn was chosen because this is concerning for polynephritis given new right back and abdominal pain. There was a time when her CT was obtained which was after IM Dilaudid which apparently had been given in her right buttock, although I did not initially know this. Thus there was an area of inflammation in her back that was consistent  with possible recent injection. However when asked her about injection she states she had not received an injection recently. Thus is looking for another source but it turns out that this was likely from the IM Dilaudid. However this is also not consistent with why she is hurting now and her back pain is in a different area. While in the ED she did develop hypotension that was transient. Given IV fluids with good response. Due to this, she will be admitted to the hospitalist for further treatment and care for pyelonephritis.  Final Clinical Impressions(s) / ED Diagnoses   Final diagnoses:  Pyelonephritis   New Prescriptions New Prescriptions   No medications on file   I personally performed the services described in this documentation, which was scribed in my presence. The recorded information has been reviewed and is accurate.     Sherwood Gambler, MD 03/07/17 [id number]

## 2017-03-07 NOTE — ED Notes (Signed)
Pt reported to this RN that the pain medication she received in previous shift was injected in her bottom, "in my butt... in my hip."

## 2017-03-07 NOTE — ED Notes (Signed)
Pt did not need anything at this time  

## 2017-03-07 NOTE — ED Notes (Signed)
Report attempted 

## 2017-03-07 NOTE — ED Notes (Signed)
PT CBG was 140 nurse notified

## 2017-03-08 ENCOUNTER — Encounter (HOSPITAL_COMMUNITY): Payer: Self-pay | Admitting: General Practice

## 2017-03-08 DIAGNOSIS — E785 Hyperlipidemia, unspecified: Secondary | ICD-10-CM | POA: Diagnosis not present

## 2017-03-08 DIAGNOSIS — N390 Urinary tract infection, site not specified: Secondary | ICD-10-CM | POA: Diagnosis not present

## 2017-03-08 DIAGNOSIS — E118 Type 2 diabetes mellitus with unspecified complications: Secondary | ICD-10-CM | POA: Diagnosis not present

## 2017-03-08 DIAGNOSIS — R300 Dysuria: Secondary | ICD-10-CM | POA: Diagnosis not present

## 2017-03-08 DIAGNOSIS — A419 Sepsis, unspecified organism: Principal | ICD-10-CM

## 2017-03-08 LAB — GLUCOSE, CAPILLARY
GLUCOSE-CAPILLARY: 124 mg/dL — AB (ref 65–99)
Glucose-Capillary: 122 mg/dL — ABNORMAL HIGH (ref 65–99)
Glucose-Capillary: 95 mg/dL (ref 65–99)
Glucose-Capillary: 136 mg/dL — ABNORMAL HIGH (ref 65–99)

## 2017-03-08 LAB — CBC
HCT: 38.5 % (ref 36.0–46.0)
Hemoglobin: 12 g/dL (ref 12.0–15.0)
MCH: 27.6 pg (ref 26.0–34.0)
MCHC: 31.2 g/dL (ref 30.0–36.0)
MCV: 88.5 fL (ref 78.0–100.0)
PLATELETS: 172 10*3/uL (ref 150–400)
RBC: 4.35 MIL/uL (ref 3.87–5.11)
RDW: 14.9 % (ref 11.5–15.5)
WBC: 5.7 10*3/uL (ref 4.0–10.5)

## 2017-03-08 LAB — COMPREHENSIVE METABOLIC PANEL
ALBUMIN: 3.3 g/dL — AB (ref 3.5–5.0)
ALK PHOS: 57 U/L (ref 38–126)
ALT: 15 U/L (ref 14–54)
ANION GAP: 6 (ref 5–15)
AST: 18 U/L (ref 15–41)
BILIRUBIN TOTAL: 0.4 mg/dL (ref 0.3–1.2)
BUN: 7 mg/dL (ref 6–20)
CALCIUM: 8.7 mg/dL — AB (ref 8.9–10.3)
CO2: 26 mmol/L (ref 22–32)
Chloride: 108 mmol/L (ref 101–111)
Creatinine, Ser: 0.82 mg/dL (ref 0.44–1.00)
GFR calc Af Amer: >60 mL/min (ref >60)
GFR calc non Af Amer: >60 mL/min (ref >60)
Glucose, Bld: 155 mg/dL — ABNORMAL HIGH (ref 65–99)
Potassium: 3.8 mmol/L (ref 3.5–5.1)
Sodium: 140 mmol/L (ref 135–145)
TOTAL PROTEIN: 6 g/dL — AB (ref 6.5–8.1)

## 2017-03-08 LAB — CK: CK TOTAL: 228 U/L (ref 38–234)

## 2017-03-08 LAB — URINE CULTURE

## 2017-03-08 LAB — HEMOGLOBIN A1C
Hgb A1c MFr Bld: 7 % — ABNORMAL HIGH (ref 4.8–5.6)
Mean Plasma Glucose: 154 mg/dL

## 2017-03-08 MED ORDER — SODIUM CHLORIDE 0.9 % IV SOLN
INTRAVENOUS | Status: DC
  Administered 2017-03-08: 18:00:00 via INTRAVENOUS

## 2017-03-08 NOTE — Progress Notes (Signed)
PROGRESS NOTE    Patricia Cuevas  HWE:[id number] DOB: 1951-07-25 DOA: 03/07/2017 PCP: [name], MD    Brief Narrative: 66 year old lady with prior h/o CAD, s/p CABG, asthma, diabetes mellitus, sleep apnea, dyslipidemia, presented to ED, with urinary frequency, hypotension, and bradycardia.  She was admitted for evaluation of sepsis possibly from a UTI.   Assessment & Plan:   Principal Problem:   Sepsis due to urinary tract infection ([place]) Active Problems:   Asthma   Sleep apnea   Diabetes mellitus type 2, controlled ([place])   Hyperlipidemia   CAD (coronary artery disease) with prior CABG   UTI (urinary tract infection)   Sepsis ([place])   HTN (hypertension)   Constipation by delayed colonic transit   SEPSIS; Appears to have ruled out.  Lactic acid normalized.  Urine cultures have been negative.  Urinary frequency improved.  Blood cultures have been negative.  Resume IV meropenem till am and if cultures are negative, can d.c home.    Asthma, No wheezing heard.   Diabetes mellitus;  CBG (last 3)   Recent Labs  03/07/17 2038 03/08/17 0826 03/08/17 1316  GLUCAP 121* 124* 122*    Resume SSI and glipizide.    Hypotension: possibly from dehydration which would explain the lactic acidosis.  Resolved with fluid resuscitation.   CAD s/p CABG;  No chest pain .  Resume crestor.     Dyslipidemia: resume home meds.    DVT prophylaxis: lovenox.  Code Status: full code.  Family Communication: discussed the plan of care with the patient.  Disposition Plan: Home tomorrow .   Consultants:   NONE.   Procedures: MRI OF THE PELVIS UNREMARKABLE.   Antimicrobials: meropenam   Subjective: Reports feeling very tired.  Objective: Vitals:   03/08/17 0251 03/08/17 0257 03/08/17 0522 03/08/17 0720  BP: (!) 103/49  (!) 153/72 110/64  Pulse:  (!) 36 78 68  Resp:  (!) 23 (!) 22 18  Temp:    98 F (36.7 C)  TempSrc:    Oral  SpO2:  100% 100% 100%  Weight:   96.6  kg (213 lb)   Height:        Intake/Output Summary (Last 24 hours) at 03/08/17 1624 Last data filed at 03/08/17 0954  Gross per 24 hour  Intake              243 ml  Output             3250 ml  Net            -3007 ml   Filed Weights   03/08/17 0522  Weight: 96.6 kg (213 lb)    Examination:  General exam: Appears calm and comfortable  Respiratory system: Clear to auscultation. Respiratory effort normal. Cardiovascular system: S1 & S2 heard, RRR. No JVD, murmurs, rubs, gallops or clicks. No pedal edema. Gastrointestinal system: Abdomen is nondistended, soft and nontender. No organomegaly or masses felt. Normal bowel sounds heard. Central nervous system: Alert and oriented. No focal neurological deficits. Extremities: Symmetric 5 x 5 power. Skin: No rashes, lesions or ulcers Psychiatry: Judgement and insight appear normal. Mood & affect appropriate.     Data Reviewed: I have personally reviewed following labs and imaging studies  CBC:  Recent Labs Lab 03/07/17 0026 03/08/17 0414  WBC 7.3 5.7  HGB 13.3 12.0  HCT 42.1 38.5  MCV 88.3 88.5  PLT 188 893   Basic Metabolic Panel:  Recent Labs Lab 03/07/17 0026 03/07/17  1300 03/08/17 0414  NA 137  --  140  K 4.3  --  3.8  CL 106  --  108  CO2 24  --  26  GLUCOSE 179*  --  155*  BUN 14  --  7  CREATININE 0.89  --  0.82  CALCIUM 9.3  --  8.7*  MG  --  1.9  --   PHOS  --  3.6  --    GFR: Estimated Creatinine Clearance: 62 mL/min (by C-G formula based on SCr of 0.82 mg/dL). Liver Function Tests:  Recent Labs Lab 03/07/17 0026 03/08/17 0414  AST 21 18  ALT 14 15  ALKPHOS 70 57  BILITOT 0.5 0.4  PROT 6.5 6.0*  ALBUMIN 3.8 3.3*    Recent Labs Lab 03/07/17 0026  LIPASE 37   No results for input(s): AMMONIA in the last 168 hours. Coagulation Profile:  Recent Labs Lab 03/07/17 0827  INR 1.12   Cardiac Enzymes:  Recent Labs Lab 03/07/17 0827 03/08/17 0414  CKTOTAL 308* 228   BNP (last 3  results) No results for input(s): PROBNP in the last 8760 hours. HbA1C:  Recent Labs  03/07/17 0026  HGBA1C 7.0*   CBG:  Recent Labs Lab 03/07/17 1106 03/07/17 1717 03/07/17 2038 03/08/17 0826 03/08/17 1316  GLUCAP 140* 134* 121* 124* 122*   Lipid Profile: No results for input(s): CHOL, HDL, LDLCALC, TRIG, CHOLHDL, LDLDIRECT in the last 72 hours. Thyroid Function Tests: No results for input(s): TSH, T4TOTAL, FREET4, T3FREE, THYROIDAB in the last 72 hours. Anemia Panel: No results for input(s): VITAMINB12, FOLATE, FERRITIN, TIBC, IRON, RETICCTPCT in the last 72 hours. Sepsis Labs:  Recent Labs Lab 03/07/17 0827 03/07/17 1111 03/07/17 1420  PROCALCITON <0.10  --   --   LATICACIDVEN 1.6 2.2* 1.8    Recent Results (from the past 240 hour(s))  Urine culture     Status: Abnormal   Collection Time: 03/07/17 12:30 AM  Result Value Ref Range Status   Specimen Description URINE, RANDOM  Final   Special Requests NONE  Final   Culture <10,000 COLONIES/mL INSIGNIFICANT GROWTH (A)  Final   Report Status 03/08/2017 FINAL  Final  Culture, blood (x 2)     Status: None (Preliminary result)   Collection Time: 03/07/17  8:27 AM  Result Value Ref Range Status   Specimen Description BLOOD RIGHT FOREARM  Final   Special Requests   Final    BOTTLES DRAWN AEROBIC AND ANAEROBIC Blood Culture adequate volume   Culture NO GROWTH 1 DAY  Final   Report Status PENDING  Incomplete  MRSA PCR Screening     Status: None   Collection Time: 03/07/17  1:27 PM  Result Value Ref Range Status   MRSA by PCR NEGATIVE NEGATIVE Final    Comment:        The GeneXpert MRSA Assay (FDA approved for NASAL specimens only), is one component of a comprehensive MRSA colonization surveillance program. It is not intended to diagnose MRSA infection nor to guide or monitor treatment for MRSA infections.   Culture, blood (x 2)     Status: None (Preliminary result)   Collection Time: 03/07/17  2:20 PM    Result Value Ref Range Status   Specimen Description BLOOD LEFT FOREARM  Final   Special Requests IN PEDIATRIC BOTTLE Blood Culture adequate volume  Final   Culture NO GROWTH < 24 HOURS  Final   Report Status PENDING  Incomplete  Radiology Studies: Mr Pelvis Wo Contrast  Result Date: 03/08/2017 CLINICAL DATA:  Sudden onset right lower quadrant abdominal pain radiating to the low back EXAM: MRI PELVIS WITHOUT CONTRAST TECHNIQUE: Multiplanar multisequence MR imaging of the pelvis was performed. No intravenous contrast was administered. COMPARISON:  CT 03/07/2017 FINDINGS: Urinary Tract:  Urinary bladder is unremarkable. Bowel:  Unremarkable visualized pelvic bowel loops. Vascular/Lymphatic: No pathologically enlarged lymph nodes. No significant vascular abnormality seen. Reproductive: Status post hysterectomy. No evidence for adnexal mass. Other: No significant free fluid in the pelvis. Signal artifact lower anterior abdominal wall, corresponding to postsurgical changes on CT. Musculoskeletal: No evidence for hip effusion. No significant edema or abnormal signal within the visualized muscular structures of the pelvis. Minimal edema within the right flank subcutaneous fat. Normal marrow signal in the imaged osseous structures of the pelvis. IMPRESSION: 1. Negative for edema or abnormal muscular signal. 2. Negative for signal abnormality within the visualized osseous structures of the pelvis 3. Nonspecific mild edema within the right flank subcutaneous fat. Electronically Signed   By: Donavan Foil M.D.   On: 03/08/2017 00:33   Ct Renal Stone Study  Result Date: 03/07/2017 CLINICAL DATA:  Lower abdominal pain radiating to RIGHT flank. History of diabetes, colonic polyps, breast cancer. EXAM: CT ABDOMEN AND PELVIS WITHOUT CONTRAST TECHNIQUE: Multidetector CT imaging of the abdomen and pelvis was performed following the standard protocol without IV contrast. COMPARISON:  CT abdomen and pelvis  June 08, 2016 FINDINGS: LOWER CHEST: Status post median sternotomy. Severe coronary artery calcification versus stents. RIGHT lung base atelectasis. Heart size is normal. No pericardial effusion. Mildly elevated RIGHT hemidiaphragm. HEPATOBILIARY: Normal. PANCREAS: Normal. SPLEEN: Normal. ADRENALS/URINARY TRACT: Kidneys are orthotopic, demonstrating normal size and morphology. No nephrolithiasis, hydronephrosis; limited assessment for renal masses on this nonenhanced examination. The unopacified ureters are normal in course and caliber. Urinary bladder is partially distended and unremarkable. Normal adrenal glands. STOMACH/BOWEL: Status post gastric bypass. Stomach, small and large bowel are normal in course and caliber, normal appendix. Sensitivity for bowel pathology decreased without oral contrast. VASCULAR/LYMPHATIC: Aortoiliac vessels are normal in course and caliber. No lymphadenopathy by CT size criteria. REPRODUCTIVE: Status post hysterectomy. OTHER: No intraperitoneal free fluid or free air. MUSCULOSKELETAL: Non-acute. Postsurgical changes anterior abdominal wall. RIGHT focal flank soft tissue stranding Ing gas consistent with injection site. Multilevel severe degenerative change of the thoracic spine and L5-S1. IMPRESSION: No urolithiasis, obstructive uropathy nor acute intra-abdominal/pelvic process. Normal appendix. Electronically Signed   By: Elon Alas M.D.   On: 03/07/2017 04:26        Scheduled Meds: . aspirin EC  81 mg Oral Daily  . docusate sodium  100 mg Oral BID  . enoxaparin (LOVENOX) injection  40 mg Subcutaneous Q24H  . glipiZIDE  10 mg Oral Q breakfast  . insulin aspart  0-5 Units Subcutaneous QHS  . insulin aspart  0-9 Units Subcutaneous TID WC  . latanoprost  1 drop Both Eyes QHS  . magnesium oxide  200 mg Oral Daily  . montelukast  10 mg Oral Daily  . polyethylene glycol  17 g Oral QHS  . rosuvastatin  5 mg Oral QHS  . sodium chloride flush  3 mL Intravenous  Q12H   Continuous Infusions: . meropenem (MERREM) IV Stopped (03/08/17 1053)     LOS: 1 day    Time spent: 35 minutes.     Hosie Poisson, MD [facility] Pager [phone]  If 7PM-7AM, please contact night-coverage [email or web address] Password [facility] 03/08/2017, 4:24 PM

## 2017-03-09 DIAGNOSIS — R300 Dysuria: Secondary | ICD-10-CM | POA: Diagnosis not present

## 2017-03-09 DIAGNOSIS — I10 Essential (primary) hypertension: Secondary | ICD-10-CM | POA: Diagnosis not present

## 2017-03-09 DIAGNOSIS — A419 Sepsis, unspecified organism: Secondary | ICD-10-CM | POA: Diagnosis not present

## 2017-03-09 DIAGNOSIS — E118 Type 2 diabetes mellitus with unspecified complications: Secondary | ICD-10-CM | POA: Diagnosis not present

## 2017-03-09 LAB — GLUCOSE, CAPILLARY
Glucose-Capillary: 110 mg/dL — ABNORMAL HIGH (ref 65–99)
Glucose-Capillary: 117 mg/dL — ABNORMAL HIGH (ref 65–99)

## 2017-03-09 MED ORDER — DOCUSATE SODIUM 100 MG PO CAPS: 100.0000 mg | ORAL_CAPSULE | Freq: Two times a day (BID) | ORAL | 0 refills | Status: DC

## 2017-03-09 NOTE — Progress Notes (Signed)
Patient has received all discharge information and education. Patient has verbalized understanding.

## 2017-03-09 NOTE — Care Management Note (Signed)
Case Management Note  Patient Details  Name: Patricia Cuevas MRN: [id number] Date of Birth: 08-Sep-1951  Subjective/Objective:                 Spoke with patient at the bedside. She states she lives at home, has 24 hour supervision by son and family. She stats she has PCS through [facility] 6 days a week. She has rollator and cane, will need 3/1 and tub bench at DC. Patient will also need HH at DC. Provided choice and would like to use [facility]. Referral placed to Ramond Marrow clinical liaison [facility] for [facility] PT 3/1 and tub bench. No other CM needs identified at this time.    Action/Plan:  Anticipate DC to home w [place] through [facility] tomorrow.   Expected Discharge Date:  03/09/17               Expected Discharge Plan:  [place]  In-House Referral:     Discharge planning Services  CM Consult  Post Acute Care Choice:  Home Health, Durable Medical Equipment Choice offered to:  Patient  DME Arranged:  3-N-1, Tub bench DME Agency:  [place]:  PT [place]:  [place]  Status of Service:  Completed, signed off  If discussed at [place] of Stay Meetings, dates discussed:    Additional Comments:  Carles Collet, RN 03/09/2017, 2:39 PM

## 2017-03-09 NOTE — Plan of Care (Signed)
Problem: Pain Managment: Goal: General experience of comfort will improve Outcome: Progressing Patient denies pain at this time, will continue to monitor.   

## 2017-03-09 NOTE — Evaluation (Addendum)
Physical Therapy Evaluation Patient Details Name: Patricia Cuevas MRN: [id number] DOB: 1950/10/25 Today's Date: 03/09/2017   History of Present Illness  66 year old lady with prior h/o CAD, s/p CABG, asthma, diabetes mellitus, sleep apnea, dyslipidemia, presented to ED, with urinary frequency, hypotension, and bradycardia.   Clinical Impression  Orders received for PT evaluation. Patient demonstrates deficits in functional mobility as indicated below. Will benefit from continued skilled PT to address deficits and maximize function. Will see as indicated and progress as tolerated.      Follow Up Recommendations Home health PT;Supervision for mobility/OOB    Equipment Recommendations  3in1 (PT)    Recommendations for Other Services       Precautions / Restrictions Precautions Precautions: Fall Restrictions Weight Bearing Restrictions: No      Mobility  Bed Mobility Overal bed mobility: Needs Assistance Bed Mobility: Rolling;Sidelying to Sit Rolling: Supervision Sidelying to sit: Min assist       General bed mobility comments: min assist to elevate trunk to upright, increased time and effort to perform  Transfers Overall transfer level: Needs assistance Equipment used: Rolling walker (2 wheeled) Transfers: Sit to/from [facility] Sit to Stand: Min guard Stand pivot transfers: Min guard       General transfer comment: min guard for safety and stability, no physical assist required  Ambulation/Gait Ambulation/Gait assistance: Min guard Ambulation Distance (Feet): 18 Feet Assistive device: Rolling walker (2 wheeled) Gait Pattern/deviations: Step-to pattern;Decreased stride length;Antalgic;Wide base of support;Trunk flexed Gait velocity: decreased Gait velocity interpretation: Below normal speed for age/gender General Gait Details: patient with slow limited gait, easily fatigues, no physical assist provided, min guard for safety  Stairs             Wheelchair Mobility    Modified Rankin (Stroke Patients Only)       Balance Overall balance assessment: Needs assistance   Sitting balance-Leahy Scale: Good     Standing balance support: During functional activity;Bilateral upper extremity supported Standing balance-Leahy Scale: Poor (to fair) Standing balance comment: able to stand and perform hygiene and pericare without physical assist, reliance on RW upon fatigue                             Pertinent Vitals/Pain Pain Assessment: Faces Faces Pain Scale: Hurts a little bit Pain Location: headache Pain Descriptors / Indicators: Headache Pain Intervention(s): Monitored during session    Home Living Family/patient expects to be discharged to:: Private residence Living Arrangements: Children Available Help at Discharge: Family;Available 24 hours/day;Personal care attendant Type of Home: House Home Access: Ramped entrance     Home Layout: One level Home Equipment: Walker - 4 wheels      Prior Function Level of Independence: Needs assistance   Gait / Transfers Assistance Needed: walks with rollator and supervision  ADL's / Homemaking Assistance Needed: has assist for ADL activities        Hand Dominance   Dominant Hand: Right    Extremity/Trunk Assessment   Upper Extremity Assessment Upper Extremity Assessment: Generalized weakness    Lower Extremity Assessment Lower Extremity Assessment: Generalized weakness (+ arthritic changes noted in bilateral LEs)    Cervical / Trunk Assessment Cervical / Trunk Assessment:  (increased body habitus)  Communication   Communication: HOH  Cognition Arousal/Alertness: Awake/alert Behavior During Therapy: WFL for tasks assessed/performed Overall Cognitive Status: Within Functional Limits for tasks assessed  General Comments      Exercises     Assessment/Plan    PT Assessment Patient needs  continued PT services  PT Problem List Decreased strength;Decreased activity tolerance;Decreased balance;Decreased mobility;Obesity       PT Treatment Interventions DME instruction;Gait training;Functional mobility training;Therapeutic activities;Therapeutic exercise;Balance training;Patient/family education    PT Goals (Current goals can be found in the Care Plan section)  Acute Rehab PT Goals Patient Stated Goal: to go home PT Goal Formulation: With patient Time For Goal Achievement: 03/23/17 Potential to Achieve Goals: Good    Frequency Min 3X/week   Barriers to discharge        Co-evaluation               AM-PAC PT "6 Clicks" Daily Activity  Outcome Measure Difficulty turning over in bed (including adjusting bedclothes, sheets and blankets)?: A Little Difficulty moving from lying on back to sitting on the side of the bed? : Total Difficulty sitting down on and standing up from a chair with arms (e.g., wheelchair, bedside commode, etc,.)?: A Little Help needed moving to and from a bed to chair (including a wheelchair)?: A Little Help needed walking in hospital room?: A Little Help needed climbing 3-5 steps with a railing? : A Lot 6 Click Score: 15    End of Session   Activity Tolerance: Patient tolerated treatment well;Patient limited by fatigue Patient left: in chair;with call bell/phone within reach;with chair alarm set Nurse Communication: Mobility status PT Visit Diagnosis: Unsteadiness on feet (R26.81);Muscle weakness (generalized) (M62.81)    Time: 0223-3612 PT Time Calculation (min) (ACUTE ONLY): 22 min   Charges:   PT Evaluation $PT Eval Moderate Complexity: 1 Procedure     PT G Codes:        Alben Deeds, PT DPT NCS [phone]   Duncan Dull 03-25-2017, 8:20 AM     03-25-2017 0800  PT G-Codes **NOT FOR INPATIENT CLASS**  Functional Assessment Tool Used Clinical judgement  Functional Limitation Mobility: Walking and moving around   Mobility: Walking and Moving Around Current Status [phone]) CJ  Mobility: Walking and Moving Around Goal Status [phone]) CI  Late entry G codes Alben Deeds, PT DPT  Board Certified Neurologic Specialist [phone]

## 2017-03-09 NOTE — Plan of Care (Signed)
Problem: Safety: Goal: Ability to remain free from injury will improve Outcome: Progressing Patient uses call light or calls on the phone appropriately and has all personal belongings on bedside stand within her reach.

## 2017-03-10 NOTE — Discharge Summary (Signed)
Physician Discharge Summary  Patricia Cuevas YKD:[id number] DOB: 04-14-1951 DOA: 03/07/2017  PCP: [name], MD  Admit date: 03/07/2017 Discharge date: 03/09/2017  Admitted From:  Home.  Disposition:  Home.   Recommendations for Outpatient Follow-up:  1. Follow up with PCP in 1-2 weeks 2. Please obtain BMP/CBC in one week     Discharge Condition:stable.  CODE STATUS: full code.  Diet recommendation: Heart Healthy / Carb Modified   Brief/Interim Summary: 66 year old lady with prior h/o CAD, s/p CABG, asthma, diabetes mellitus, sleep apnea, dyslipidemia, presented to ED, with urinary frequency, hypotension, and bradycardia.  She was admitted for evaluation of sepsis possibly from a UTI.   Discharge Diagnoses:  Principal Problem:   Sepsis due to urinary tract infection ([place]) Active Problems:   Asthma   Sleep apnea   Diabetes mellitus type 2, controlled ([place])   Hyperlipidemia   CAD (coronary artery disease) with prior CABG   UTI (urinary tract infection)   Sepsis ([place])   HTN (hypertension)   Constipation by delayed colonic transit  SEPSIS; Appears to have ruled out.  Lactic acid normalized.  Urine cultures have been negative.  Urinary frequency improved.  Blood cultures have been negative.  Discontinued with IV antibiotics and discharged the patient home.  Her symptoms probably from dehydration.     Asthma, No wheezing heard.   Diabetes mellitus;  CBG (last 3)   Recent Labs (last 2 labs)    Recent Labs  03/07/17 2038 03/08/17 0826 03/08/17 1316  GLUCAP 121* 124* 122*      Resume SSI and glipizide.  hgba1c is 7. Diabetes controlled.    Hypotension: possibly from dehydration which would explain the lactic acidosis.  Resolved with fluid resuscitation.   CAD s/p CABG;  No chest pain .  Resume crestor. Her last cath showed normal LVEF and normal EDP, no congestive heart failure.     Dyslipidemia: resume home meds.    Discharge  Instructions  Discharge Instructions    Diet - low sodium heart healthy    Complete by:  As directed      Allergies as of 03/09/2017      Reactions   Latex Itching, Rash      Medication List    TAKE these medications   albuterol 108 (90 Base) MCG/ACT inhaler Commonly known as:  PROVENTIL HFA;VENTOLIN HFA Inhale 2 puffs into the lungs every 6 (six) hours as needed for shortness of breath.   aspirin EC 81 MG tablet Take 81 mg by mouth daily.   cetirizine 10 MG tablet Commonly known as:  ZYRTEC Take 10 mg by mouth daily as needed for allergies.   docusate sodium 100 MG capsule Commonly known as:  COLACE Take 1 capsule (100 mg total) by mouth 2 (two) times daily.   flintstones complete 60 MG chewable tablet Chew 1 tablet by mouth daily.   glipiZIDE 10 MG 24 hr tablet Commonly known as:  GLUCOTROL XL Take 1 tablet (10 mg total) by mouth daily with breakfast.   latanoprost 0.005 % ophthalmic solution Commonly known as:  XALATAN Place 1 drop into both eyes at bedtime.   lisinopril-hydrochlorothiazide 20-12.5 MG tablet Commonly known as:  PRINZIDE,ZESTORETIC Take 1 tablet by mouth daily.   metoprolol tartrate 25 MG tablet Commonly known as:  LOPRESSOR take 1 tablet by mouth twice a day   montelukast 10 MG tablet Commonly known as:  SINGULAIR Take 10 mg by mouth daily.   nitroGLYCERIN 0.4 MG SL tablet Commonly  known as:  NITROSTAT Place 1 tablet (0.4 mg total) under the tongue every 5 (five) minutes as needed for chest pain.   rosuvastatin 5 MG tablet Commonly known as:  CRESTOR Take 5 mg by mouth at bedtime.   VICODIN HP 10-300 MG Tabs Generic drug:  Hydrocodone-Acetaminophen Take 1 tablet by mouth 2 (two) times daily as needed (pain).      [place] Follow up.   Why:  3/1 and tub bench to be delivered to room prior to DC. Contact information: [address] [place] [address] [phone]         [facility] Follow up.   Why:  Home Health PT. You will be contacted in the next 1-2 days to set up your first home visit.  Contact information: [address] [place] [address] [phone]        [name], MD. Schedule an appointment as soon as possible for a visit in 1 week(s).   Specialty:  Family Medicine Why:  FOLLOW UP APPT.  Contact information: [place] [address] [phone]          Allergies  Allergen Reactions  . Latex Itching and Rash    Consultations:  None.    Procedures/Studies: Mr Pelvis Wo Contrast  Result Date: 03/08/2017 CLINICAL DATA:  Sudden onset right lower quadrant abdominal pain radiating to the low back EXAM: MRI PELVIS WITHOUT CONTRAST TECHNIQUE: Multiplanar multisequence MR imaging of the pelvis was performed. No intravenous contrast was administered. COMPARISON:  CT 03/07/2017 FINDINGS: Urinary Tract:  Urinary bladder is unremarkable. Bowel:  Unremarkable visualized pelvic bowel loops. Vascular/Lymphatic: No pathologically enlarged lymph nodes. No significant vascular abnormality seen. Reproductive: Status post hysterectomy. No evidence for adnexal mass. Other: No significant free fluid in the pelvis. Signal artifact lower anterior abdominal wall, corresponding to postsurgical changes on CT. Musculoskeletal: No evidence for hip effusion. No significant edema or abnormal signal within the visualized muscular structures of the pelvis. Minimal edema within the right flank subcutaneous fat. Normal marrow signal in the imaged osseous structures of the pelvis. IMPRESSION: 1. Negative for edema or abnormal muscular signal. 2. Negative for signal abnormality within the visualized osseous structures of the pelvis 3. Nonspecific mild edema within the right flank subcutaneous fat. Electronically Signed   By: Donavan Foil M.D.   On: 03/08/2017 00:33   Ct Renal Stone Study  Result Date: 03/07/2017 CLINICAL  DATA:  Lower abdominal pain radiating to RIGHT flank. History of diabetes, colonic polyps, breast cancer. EXAM: CT ABDOMEN AND PELVIS WITHOUT CONTRAST TECHNIQUE: Multidetector CT imaging of the abdomen and pelvis was performed following the standard protocol without IV contrast. COMPARISON:  CT abdomen and pelvis June 08, 2016 FINDINGS: LOWER CHEST: Status post median sternotomy. Severe coronary artery calcification versus stents. RIGHT lung base atelectasis. Heart size is normal. No pericardial effusion. Mildly elevated RIGHT hemidiaphragm. HEPATOBILIARY: Normal. PANCREAS: Normal. SPLEEN: Normal. ADRENALS/URINARY TRACT: Kidneys are orthotopic, demonstrating normal size and morphology. No nephrolithiasis, hydronephrosis; limited assessment for renal masses on this nonenhanced examination. The unopacified ureters are normal in course and caliber. Urinary bladder is partially distended and unremarkable. Normal adrenal glands. STOMACH/BOWEL: Status post gastric bypass. Stomach, small and large bowel are normal in course and caliber, normal appendix. Sensitivity for bowel pathology decreased without oral contrast. VASCULAR/LYMPHATIC: Aortoiliac vessels are normal in course and caliber. No lymphadenopathy by CT size criteria. REPRODUCTIVE: Status post hysterectomy. OTHER: No  intraperitoneal free fluid or free air. MUSCULOSKELETAL: Non-acute. Postsurgical changes anterior abdominal wall. RIGHT focal flank soft tissue stranding Ing gas consistent with injection site. Multilevel severe degenerative change of the thoracic spine and L5-S1. IMPRESSION: No urolithiasis, obstructive uropathy nor acute intra-abdominal/pelvic process. Normal appendix. Electronically Signed   By: Elon Alas M.D.   On: 03/07/2017 04:26     Subjective: No chest pain or sob. No pain , no nausea, or vomiting.   Discharge Exam: Vitals:   03/09/17 0726 03/09/17 1138  BP: 139/64 140/70  Pulse: 69 (!) 55  Resp: 18   Temp:  98.3 F  (36.8 C)   Vitals:   03/09/17 0535 03/09/17 0545 03/09/17 0726 03/09/17 1138  BP: (!) 162/95 (!) 159/76 139/64 140/70  Pulse: 75 72 69 (!) 55  Resp: (!) 23 (!) 25 18   Temp:    98.3 F (36.8 C)  TempSrc:    Oral  SpO2: 97% 96% 97% 98%  Weight:      Height:        General: Pt is alert, awake, not in acute distress Cardiovascular: RRR, S1/S2 +, no rubs, no gallops Respiratory: CTA bilaterally, no wheezing, no rhonchi Abdominal: Soft, NT, ND, bowel sounds + Extremities: no edema, no cyanosis    The results of significant diagnostics from this hospitalization (including imaging, microbiology, ancillary and laboratory) are listed below for reference.     Microbiology: Recent Results (from the past 240 hour(s))  Urine culture     Status: Abnormal   Collection Time: 03/07/17 12:30 AM  Result Value Ref Range Status   Specimen Description URINE, RANDOM  Final   Special Requests NONE  Final   Culture <10,000 COLONIES/mL INSIGNIFICANT GROWTH (A)  Final   Report Status 03/08/2017 FINAL  Final  Culture, blood (x 2)     Status: None (Preliminary result)   Collection Time: 03/07/17  8:27 AM  Result Value Ref Range Status   Specimen Description BLOOD RIGHT FOREARM  Final   Special Requests   Final    BOTTLES DRAWN AEROBIC AND ANAEROBIC Blood Culture adequate volume   Culture NO GROWTH 2 DAYS  Final   Report Status PENDING  Incomplete  MRSA PCR Screening     Status: None   Collection Time: 03/07/17  1:27 PM  Result Value Ref Range Status   MRSA by PCR NEGATIVE NEGATIVE Final    Comment:        The GeneXpert MRSA Assay (FDA approved for NASAL specimens only), is one component of a comprehensive MRSA colonization surveillance program. It is not intended to diagnose MRSA infection nor to guide or monitor treatment for MRSA infections.   Culture, blood (x 2)     Status: None (Preliminary result)   Collection Time: 03/07/17  2:20 PM  Result Value Ref Range Status   Specimen  Description BLOOD LEFT FOREARM  Final   Special Requests IN PEDIATRIC BOTTLE Blood Culture adequate volume  Final   Culture NO GROWTH 2 DAYS  Final   Report Status PENDING  Incomplete     Labs: BNP (last 3 results) No results for input(s): BNP in the last 8760 hours. Basic Metabolic Panel:  Recent Labs Lab 03/07/17 0026 03/07/17 1300 03/08/17 0414  NA 137  --  140  K 4.3  --  3.8  CL 106  --  108  CO2 24  --  26  GLUCOSE 179*  --  155*  BUN 14  --  7  CREATININE  0.89  --  0.82  CALCIUM 9.3  --  8.7*  MG  --  1.9  --   PHOS  --  3.6  --    Liver Function Tests:  Recent Labs Lab 03/07/17 0026 03/08/17 0414  AST 21 18  ALT 14 15  ALKPHOS 70 57  BILITOT 0.5 0.4  PROT 6.5 6.0*  ALBUMIN 3.8 3.3*    Recent Labs Lab 03/07/17 0026  LIPASE 37   No results for input(s): AMMONIA in the last 168 hours. CBC:  Recent Labs Lab 03/07/17 0026 03/08/17 0414  WBC 7.3 5.7  HGB 13.3 12.0  HCT 42.1 38.5  MCV 88.3 88.5  PLT 188 172   Cardiac Enzymes:  Recent Labs Lab 03/07/17 0827 03/08/17 0414  CKTOTAL 308* 228   BNP: Invalid input(s): POCBNP CBG:  Recent Labs Lab 03/08/17 1316 03/08/17 1730 03/08/17 2230 03/09/17 0752 03/09/17 1143  GLUCAP 122* 95 136* 110* 117*   D-Dimer No results for input(s): DDIMER in the last 72 hours. Hgb A1c No results for input(s): HGBA1C in the last 72 hours. Lipid Profile No results for input(s): CHOL, HDL, LDLCALC, TRIG, CHOLHDL, LDLDIRECT in the last 72 hours. Thyroid function studies No results for input(s): TSH, T4TOTAL, T3FREE, THYROIDAB in the last 72 hours.  Invalid input(s): FREET3 Anemia work up No results for input(s): VITAMINB12, FOLATE, FERRITIN, TIBC, IRON, RETICCTPCT in the last 72 hours. Urinalysis    Component Value Date/Time   COLORURINE YELLOW 03/07/2017 0028   APPEARANCEUR CLEAR 03/07/2017 0028   LABSPEC 1.024 03/07/2017 0028   PHURINE 5.0 03/07/2017 0028   GLUCOSEU NEGATIVE 03/07/2017 0028    HGBUR NEGATIVE 03/07/2017 0028   BILIRUBINUR NEGATIVE 03/07/2017 0028   KETONESUR 5 (A) 03/07/2017 0028   PROTEINUR NEGATIVE 03/07/2017 0028   UROBILINOGEN 1.0 03/31/2015 0905   NITRITE NEGATIVE 03/07/2017 0028   LEUKOCYTESUR MODERATE (A) 03/07/2017 0028   Sepsis Labs Invalid input(s): PROCALCITONIN,  WBC,  LACTICIDVEN Microbiology Recent Results (from the past 240 hour(s))  Urine culture     Status: Abnormal   Collection Time: 03/07/17 12:30 AM  Result Value Ref Range Status   Specimen Description URINE, RANDOM  Final   Special Requests NONE  Final   Culture <10,000 COLONIES/mL INSIGNIFICANT GROWTH (A)  Final   Report Status 03/08/2017 FINAL  Final  Culture, blood (x 2)     Status: None (Preliminary result)   Collection Time: 03/07/17  8:27 AM  Result Value Ref Range Status   Specimen Description BLOOD RIGHT FOREARM  Final   Special Requests   Final    BOTTLES DRAWN AEROBIC AND ANAEROBIC Blood Culture adequate volume   Culture NO GROWTH 2 DAYS  Final   Report Status PENDING  Incomplete  MRSA PCR Screening     Status: None   Collection Time: 03/07/17  1:27 PM  Result Value Ref Range Status   MRSA by PCR NEGATIVE NEGATIVE Final    Comment:        The GeneXpert MRSA Assay (FDA approved for NASAL specimens only), is one component of a comprehensive MRSA colonization surveillance program. It is not intended to diagnose MRSA infection nor to guide or monitor treatment for MRSA infections.   Culture, blood (x 2)     Status: None (Preliminary result)   Collection Time: 03/07/17  2:20 PM  Result Value Ref Range Status   Specimen Description BLOOD LEFT FOREARM  Final   Special Requests IN PEDIATRIC BOTTLE Blood Culture adequate volume  Final  Culture NO GROWTH 2 DAYS  Final   Report Status PENDING  Incomplete     Time coordinating discharge: Over 30 minutes  SIGNED:   Hosie Poisson, MD  [facility] 03/10/2017, 9:21 AM Pager   If 7PM-7AM, please contact  night-coverage [email or web address] Password [email or web address]

## 2017-03-12 LAB — CULTURE, BLOOD (ROUTINE X 2)
CULTURE: NO GROWTH
CULTURE: NO GROWTH
SPECIAL REQUESTS: ADEQUATE
Special Requests: ADEQUATE

## 2017-03-18 ENCOUNTER — Emergency Department (HOSPITAL_COMMUNITY)
Admission: EM | Admit: 2017-03-18 | Discharge: 2017-03-18 | Disposition: A | Discharge status: Home / Self Care | Payer: Medicare Other | Attending: Emergency Medicine | Admitting: Emergency Medicine

## 2017-03-18 ENCOUNTER — Emergency Department (HOSPITAL_COMMUNITY): Payer: Medicare Other

## 2017-03-18 ENCOUNTER — Encounter (HOSPITAL_COMMUNITY): Payer: Self-pay

## 2017-03-18 DIAGNOSIS — E119 Type 2 diabetes mellitus without complications: Secondary | ICD-10-CM | POA: Insufficient documentation

## 2017-03-18 DIAGNOSIS — Z79899 Other long term (current) drug therapy: Secondary | ICD-10-CM | POA: Diagnosis not present

## 2017-03-18 DIAGNOSIS — Z87891 Personal history of nicotine dependence: Secondary | ICD-10-CM | POA: Insufficient documentation

## 2017-03-18 DIAGNOSIS — M654 Radial styloid tenosynovitis [de Quervain]: Secondary | ICD-10-CM | POA: Diagnosis not present

## 2017-03-18 DIAGNOSIS — I2510 Atherosclerotic heart disease of native coronary artery without angina pectoris: Secondary | ICD-10-CM | POA: Insufficient documentation

## 2017-03-18 DIAGNOSIS — I509 Heart failure, unspecified: Secondary | ICD-10-CM | POA: Diagnosis not present

## 2017-03-18 DIAGNOSIS — M25532 Pain in left wrist: Secondary | ICD-10-CM | POA: Diagnosis present

## 2017-03-18 DIAGNOSIS — Z853 Personal history of malignant neoplasm of breast: Secondary | ICD-10-CM | POA: Insufficient documentation

## 2017-03-18 DIAGNOSIS — I252 Old myocardial infarction: Secondary | ICD-10-CM | POA: Insufficient documentation

## 2017-03-18 DIAGNOSIS — Z7982 Long term (current) use of aspirin: Secondary | ICD-10-CM | POA: Diagnosis not present

## 2017-03-18 DIAGNOSIS — J45909 Unspecified asthma, uncomplicated: Secondary | ICD-10-CM | POA: Diagnosis not present

## 2017-03-18 DIAGNOSIS — E785 Hyperlipidemia, unspecified: Secondary | ICD-10-CM | POA: Diagnosis not present

## 2017-03-18 DIAGNOSIS — I110 Hypertensive heart disease with heart failure: Secondary | ICD-10-CM | POA: Insufficient documentation

## 2017-03-18 NOTE — ED Provider Notes (Signed)
[facility] [facility] [place] DEPT Provider Note   CSN: [id number] Arrival date & time: 03/18/17  1041     History   Chief Complaint Chief Complaint  Patient presents with  . Hand Pain    HPI Patricia Cuevas is a 66 y.o. female.  66 yo F with a cc of left wrist pain.  Will get with it this morning. Pain to the base of the left thumb. Also pain to the pinky. She thinks maybe she slept on it wrong. Denies any acute trauma. Denies fever chills.   The history is provided by the patient.  Illness  This is a new problem. The current episode started 2 days ago. The problem occurs constantly. The problem has not changed since onset.Pertinent negatives include no chest pain, no headaches and no shortness of breath. Nothing aggravates the symptoms. Nothing relieves the symptoms. She has tried nothing for the symptoms. The treatment provided no relief.    Past Medical History:  Diagnosis Date  . Alcohol abuse    in the past, resolved  . Arthritis   . Asthma   . Blood transfusion without reported diagnosis 1984   after surgery  . Breast cancer [facility])    Double mastectomy 2007; s/p tamoxifen and arimidex therapy; no recurrence since 05/2008  . CAD (coronary artery disease)    Nuclear stress test Feb 11, 2011, EF 64%, no scar or ischemia    //         catheterization, November, 2011,patent LIMA-LAD-small caliber distal vessel with diffuse plaque, patent SVG to OM1 and OM 2, patent SVG to PDA and PLA, occluded SVG to diagonal, medical therapy;  Lexiscan Myoview 6/14:  Inferior thinning, no ischemia, EF 61%, low risk  . CHF (congestive heart failure) ([place])   . Chronic back pain   . Diabetes mellitus   . Dyslipidemia   . Ejection fraction    EF 65%, echo, [place], Jan 16, 2011  . Hx of CABG    2007  . Hx of colonic polyps   . Hyperlipidemia   . Hypertension   . Myocardial infarction ([place])   . Nausea & vomiting    hospitalization, November, 2011, stricture GE junction, questionable stenosis  gastrojejunostomy, disruptive primary peristaltic wave, GE reflux, delayed emptying proximal gastric pouch  . Normal cardiac stress test 02/2013  . Overweight(278.02)    stomach stapling 1985 followed by weight loss, then return of weight  . Sciatica   . Sleep apnea    CPAP being arranged May, 2011//does not use CPAP  . Tobacco abuse    in the past, resolved  . UTI (urinary tract infection)     Patient Active Problem List   Diagnosis Date Noted  . Asthma 03/07/2017  . Sleep apnea 03/07/2017  . Diabetes mellitus type 2, controlled ([place]) 03/07/2017  . Hyperlipidemia 03/07/2017  . CAD (coronary artery disease) with prior CABG 03/07/2017  . UTI (urinary tract infection) 03/07/2017  . Sepsis ([place]) 03/07/2017  . Sepsis due to urinary tract infection ([place]) 03/07/2017  . HTN (hypertension) 03/07/2017  . Constipation by delayed colonic transit 03/07/2017  . Abnormal nuclear stress test 12/20/2015  . Angina, class III ([place]) 12/20/2015  . Chest pain 11/12/2014  . Upper GI bleed 03/04/2013  . Atypical chest pain 01/07/2013  . History of tobacco abuse 01/07/2013  . SIRS (systemic inflammatory response syndrome) ([place]) 07/28/2012  . Tachycardia 07/28/2012  . GERD (gastroesophageal reflux disease) 02/24/2011  . Coronary artery disease involving native coronary artery with angina  pectoris ([place])   . Hx of CABG   . Overweight   . OSA (obstructive sleep apnea)   . Nausea & vomiting   . Breast cancer ([place])   . Hypertension   . Diabetes mellitus ([place])   . Dyslipidemia   . Ejection fraction     Past Surgical History:  Procedure Laterality Date  . ABDOMINAL HYSTERECTOMY  1989  . CARDIAC CATHETERIZATION N/A 12/20/2015   Procedure: Left Heart Cath and Cors/Grafts Angiography;  Surgeon: Leonie Man, MD;  Location: [place] CV LAB;  Service: Cardiovascular;  Laterality: N/A;  . CORONARY ARTERY BYPASS GRAFT  2009  . CORONARY STENT PLACEMENT    . GASTRIC BYPASS  1983   open.  [place]    . MASTECTOMY Bilateral 1987    OB History    No data available       Home Medications    Prior to Admission medications   Medication Sig Start Date End Date Taking? Authorizing Provider  aspirin EC 81 MG tablet Take 81 mg by mouth daily.    Yes [provider]  docusate sodium (COLACE) 100 MG capsule Take 1 capsule (100 mg total) by mouth 2 (two) times daily. 03/09/17  Yes Hosie Poisson, MD  flintstones complete (FLINTSTONES) 60 MG chewable tablet Chew 1 tablet by mouth daily.   Yes [provider]  glipiZIDE (GLUCOTROL XL) 10 MG 24 hr tablet Take 1 tablet (10 mg total) by mouth daily with breakfast. 12/17/15  Yes [name], NP  Hydrocodone-Acetaminophen (VICODIN HP) 10-300 MG TABS Take 1 tablet by mouth 2 (two) times daily as needed (pain).   Yes [provider]  latanoprost (XALATAN) 0.005 % ophthalmic solution Place 1 drop into both eyes at bedtime.   Yes [provider]  lisinopril-hydrochlorothiazide (PRINZIDE,ZESTORETIC) 20-12.5 MG per tablet Take 1 tablet by mouth daily.     Yes [provider]  metoprolol tartrate (LOPRESSOR) 25 MG tablet take 1 tablet by mouth twice a day 07/09/16  Yes Skains, Thana Farr, MD  montelukast (SINGULAIR) 10 MG tablet Take 10 mg by mouth daily.   Yes [provider]  rosuvastatin (CRESTOR) 5 MG tablet Take 5 mg by mouth at bedtime.   Yes [provider]  albuterol (PROVENTIL HFA;VENTOLIN HFA) 108 (90 BASE) MCG/ACT inhaler Inhale 2 puffs into the lungs every 6 (six) hours as needed for shortness of breath.     [provider]  cetirizine (ZYRTEC) 10 MG tablet Take 10 mg by mouth daily as needed for allergies.    [provider]  nitroGLYCERIN (NITROSTAT) 0.4 MG SL tablet Place 1 tablet (0.4 mg total) under the tongue every 5 (five) minutes as needed for chest pain. 01/15/17   [name], MD    Family History Family History  Problem Relation Age of Onset  .  Hypertension Mother   . Heart disease Mother   . Diabetes Mother   . Heart disease Father   . Colon cancer Father        does not know age of onset  . Heart disease Sister   . Colon cancer Sister 51  . Stomach cancer Sister   . Asthma Sister   . Esophageal cancer Neg Hx   . Rectal cancer Neg Hx     Social History Social History  Substance Use Topics  . Smoking status: Former Smoker    Packs/day: 3.00    Years: 27.00    Types: Cigarettes  Quit date: 09/14/2000  . Smokeless tobacco: Never Used  . Alcohol use No     Allergies   Latex   Review of Systems Review of Systems  Constitutional: Negative for chills and fever.  HENT: Negative for congestion and rhinorrhea.   Eyes: Negative for redness and visual disturbance.  Respiratory: Negative for shortness of breath and wheezing.   Cardiovascular: Negative for chest pain and palpitations.  Gastrointestinal: Negative for nausea and vomiting.  Genitourinary: Negative for dysuria and urgency.  Musculoskeletal: Positive for arthralgias and myalgias.  Skin: Negative for pallor and wound.  Neurological: Negative for dizziness and headaches.     Physical Exam Updated Vital Signs BP (!) 181/83 (BP Location: Left Leg)   Pulse (!) 57   Temp 98.2 F (36.8 C) (Oral)   Resp 18   SpO2 97%   Physical Exam  Constitutional: She is oriented to person, place, and time. She appears well-developed and well-nourished. No distress.  HENT:  Head: Normocephalic and atraumatic.  Eyes: EOM are normal. Pupils are equal, round, and reactive to light.  Neck: Normal range of motion. Neck supple.  Cardiovascular: Normal rate and regular rhythm.  Exam reveals no gallop and no friction rub.   No murmur heard. Pulmonary/Chest: Effort normal. She has no wheezes. She has no rales.  Abdominal: Soft. She exhibits no distension. There is no tenderness.  Musculoskeletal: She exhibits tenderness. She exhibits no edema.  TTP about the thumb along the  snuffbox.  + finklestein test.  PMS intact distally.  Mild pain to pinkie, boutonniere deformity  Neurological: She is alert and oriented to person, place, and time.  Skin: Skin is warm and dry. She is not diaphoretic.  Psychiatric: She has a normal mood and affect. Her behavior is normal.  Nursing note and vitals reviewed.    ED Treatments / Results  Labs (all labs ordered are listed, but only abnormal results are displayed) Labs Reviewed - No data to display  EKG  EKG Interpretation  Date/Time:  Thursday March 18 2017 11:16:51 EDT Ventricular Rate:  57 PR Interval:  158 QRS Duration: 78 QT Interval:  424 QTC Calculation: 412 R Axis:   3 Text Interpretation:  Sinus bradycardia with sinus arrhythmia Nonspecific T wave abnormality Abnormal ECG t wave diminished from prior flipped t wave seen on prior Otherwise no significant change Confirmed by Deno Etienne [phone]) on 03/18/2017 12:00:08 PM       Radiology Dg Hand Complete Left  Result Date: 03/18/2017 CLINICAL DATA:  Little finger and thumb pain.  No injury. EXAM: LEFT HAND - COMPLETE 3+ VIEW COMPARISON:  03/02/2016 FINDINGS: Degenerative changes noted in the DIP joints and first carpometacarpal joint. Degenerative changes also in the radiocarpal joint. Joint space loss and spurring in these affected joints. No acute bony abnormality. Specifically, no fracture, subluxation, or dislocation. Soft tissues are intact. IMPRESSION: No acute bony abnormality. Electronically Signed   By: Rolm Baptise M.D.   On: 03/18/2017 11:42    Procedures Procedures (including critical care time)  Medications Ordered in ED Medications - No data to display   Initial Impression / Assessment and Plan / ED Course  I have reviewed the triage vital signs and the nursing notes.  Pertinent labs & imaging results that were available during my care of the patient were reviewed by me and considered in my medical decision making (see chart for details).      66 yo F With a chief complaint of left thumb pain. Going on  just this morning. She also complaining of left pinky pain and that is been going on for the past 5 years. Patient had a positive Finkelstein test, will place in a thumb spica.  PCP follow up.  1:29 PM:  I have discussed the diagnosis/risks/treatment options with the patient and family and believe the pt to be eligible for discharge home to follow-up with PCP. We also discussed returning to the ED immediately if new or worsening sx occur. We discussed the sx which are most concerning (e.g., sudden worsening pain, fever, inability to tolerate by mouth) that necessitate immediate return. Medications administered to the patient during their visit and any new prescriptions provided to the patient are listed below.  Medications given during this visit Medications - No data to display   The patient appears reasonably screen and/or stabilized for discharge and I doubt any other medical condition or other [facility] requiring further screening, evaluation, or treatment in the ED at this time prior to discharge.    Final Clinical Impressions(s) / ED Diagnoses   Final diagnoses:  Tenosynovitis, de Quervain  Left wrist pain    New Prescriptions New Prescriptions   No medications on file     Deno Etienne, DO 03/18/17 1329

## 2017-03-18 NOTE — ED Notes (Addendum)
Restricted on both arms, pt declined BP assessment

## 2017-03-18 NOTE — Discharge Instructions (Signed)
Take your pain medicine as needed. Follow up with your PCP.

## 2017-03-18 NOTE — Progress Notes (Signed)
Orthopedic Tech Progress Note Patient Details:  Patricia Cuevas Aug 14, 1951 [id number]  Ortho Devices Type of Ortho Device: Thumb velcro splint Ortho Device/Splint Location: LUE Ortho Device/Splint Interventions: Ordered, Application   Braulio Bosch 03/18/2017, 1:27 PM

## 2017-03-18 NOTE — ED Triage Notes (Signed)
Pt is reporting she woke up this morning with left hand pain and noticed it was swelling. She denies any recent injury. She states the pain radiates up her left arm into her shoulder. Denies pain anywhere else.

## 2017-04-30 ENCOUNTER — Other Ambulatory Visit: Payer: Self-pay | Admitting: Cardiology

## 2017-04-30 DIAGNOSIS — R0789 Other chest pain: Secondary | ICD-10-CM

## 2017-06-07 ENCOUNTER — Emergency Department (HOSPITAL_COMMUNITY): Payer: Medicare Other

## 2017-06-07 ENCOUNTER — Emergency Department (HOSPITAL_COMMUNITY)
Admission: EM | Admit: 2017-06-07 | Discharge: 2017-06-07 | Disposition: A | Discharge status: Home / Self Care | Payer: Medicare Other | Attending: Emergency Medicine | Admitting: Emergency Medicine

## 2017-06-07 ENCOUNTER — Encounter (HOSPITAL_COMMUNITY): Payer: Self-pay | Admitting: Emergency Medicine

## 2017-06-07 DIAGNOSIS — Z87891 Personal history of nicotine dependence: Secondary | ICD-10-CM | POA: Insufficient documentation

## 2017-06-07 DIAGNOSIS — Z853 Personal history of malignant neoplasm of breast: Secondary | ICD-10-CM | POA: Insufficient documentation

## 2017-06-07 DIAGNOSIS — E119 Type 2 diabetes mellitus without complications: Secondary | ICD-10-CM | POA: Diagnosis not present

## 2017-06-07 DIAGNOSIS — Z955 Presence of coronary angioplasty implant and graft: Secondary | ICD-10-CM | POA: Diagnosis not present

## 2017-06-07 DIAGNOSIS — I509 Heart failure, unspecified: Secondary | ICD-10-CM | POA: Diagnosis not present

## 2017-06-07 DIAGNOSIS — R0789 Other chest pain: Secondary | ICD-10-CM | POA: Diagnosis not present

## 2017-06-07 DIAGNOSIS — I110 Hypertensive heart disease with heart failure: Secondary | ICD-10-CM | POA: Diagnosis not present

## 2017-06-07 DIAGNOSIS — J45909 Unspecified asthma, uncomplicated: Secondary | ICD-10-CM | POA: Diagnosis not present

## 2017-06-07 DIAGNOSIS — Z79899 Other long term (current) drug therapy: Secondary | ICD-10-CM | POA: Diagnosis not present

## 2017-06-07 DIAGNOSIS — I2510 Atherosclerotic heart disease of native coronary artery without angina pectoris: Secondary | ICD-10-CM | POA: Diagnosis not present

## 2017-06-07 DIAGNOSIS — Z951 Presence of aortocoronary bypass graft: Secondary | ICD-10-CM | POA: Diagnosis not present

## 2017-06-07 DIAGNOSIS — Z7982 Long term (current) use of aspirin: Secondary | ICD-10-CM | POA: Diagnosis not present

## 2017-06-07 DIAGNOSIS — R079 Chest pain, unspecified: Secondary | ICD-10-CM | POA: Diagnosis present

## 2017-06-07 DIAGNOSIS — Z91040 Latex allergy status: Secondary | ICD-10-CM | POA: Diagnosis not present

## 2017-06-07 DIAGNOSIS — Z7984 Long term (current) use of oral hypoglycemic drugs: Secondary | ICD-10-CM | POA: Diagnosis not present

## 2017-06-07 LAB — URINALYSIS, ROUTINE W REFLEX MICROSCOPIC
Bilirubin Urine: NEGATIVE
GLUCOSE, UA: NEGATIVE mg/dL
Hgb urine dipstick: NEGATIVE
KETONES UR: NEGATIVE mg/dL
LEUKOCYTES UA: NEGATIVE
Nitrite: NEGATIVE
PH: 6 (ref 5.0–8.0)
Protein, ur: NEGATIVE mg/dL
Specific Gravity, Urine: 1.006 (ref 1.005–1.030)

## 2017-06-07 LAB — CBC WITH DIFFERENTIAL/PLATELET
BASOS PCT: 0 %
Basophils Absolute: 0 10*3/uL (ref 0.0–0.1)
EOS PCT: 1 %
Eosinophils Absolute: 0 10*3/uL (ref 0.0–0.7)
HEMATOCRIT: 41.7 % (ref 36.0–46.0)
HEMOGLOBIN: 13.7 g/dL (ref 12.0–15.0)
LYMPHS PCT: 60 %
Lymphs Abs: 2.6 10*3/uL (ref 0.7–4.0)
MCH: 28.5 pg (ref 26.0–34.0)
MCHC: 32.9 g/dL (ref 30.0–36.0)
MCV: 86.9 fL (ref 78.0–100.0)
MONOS PCT: 6 %
Monocytes Absolute: 0.3 10*3/uL (ref 0.1–1.0)
NEUTROS ABS: 1.5 10*3/uL — AB (ref 1.7–7.7)
Neutrophils Relative %: 33 %
Platelets: DECREASED 10*3/uL (ref 150–400)
RBC: 4.8 MIL/uL (ref 3.87–5.11)
RDW: 15 % (ref 11.5–15.5)
SMEAR REVIEW: DECREASED
WBC: 4.4 10*3/uL (ref 4.0–10.5)

## 2017-06-07 LAB — COMPREHENSIVE METABOLIC PANEL
ALK PHOS: 61 U/L (ref 38–126)
ALT: 17 U/L (ref 14–54)
AST: 24 U/L (ref 15–41)
Albumin: 3.8 g/dL (ref 3.5–5.0)
Anion gap: 7 (ref 5–15)
BILIRUBIN TOTAL: 0.6 mg/dL (ref 0.3–1.2)
BUN: 9 mg/dL (ref 6–20)
CALCIUM: 9.1 mg/dL (ref 8.9–10.3)
CO2: 26 mmol/L (ref 22–32)
CREATININE: 0.79 mg/dL (ref 0.44–1.00)
Chloride: 104 mmol/L (ref 101–111)
Glucose, Bld: 95 mg/dL (ref 65–99)
Potassium: 4.1 mmol/L (ref 3.5–5.1)
SODIUM: 137 mmol/L (ref 135–145)
Total Protein: 6.7 g/dL (ref 6.5–8.1)

## 2017-06-07 LAB — LIPASE, BLOOD: Lipase: 33 U/L (ref 11–51)

## 2017-06-07 LAB — D-DIMER, QUANTITATIVE (NOT AT ARMC): D DIMER QUANT: 0.42 ug{FEU}/mL (ref 0.00–0.50)

## 2017-06-07 LAB — I-STAT TROPONIN, ED: TROPONIN I, POC: 0 ng/mL (ref 0.00–0.08)

## 2017-06-07 MED ORDER — ACETAMINOPHEN 325 MG PO TABS
650.0000 mg | ORAL_TABLET | Freq: Once | ORAL | Status: AC
  Administered 2017-06-07: 650 mg via ORAL
  Filled 2017-06-07: qty 2

## 2017-06-07 NOTE — ED Provider Notes (Signed)
[facility] DEPT Provider Note   CSN: [id number] Arrival date & time: 06/07/17  1115     History   Chief Complaint No chief complaint on file.   HPI Patricia Cuevas is a 66 y.o. female.  Patient is a 66 year old female with a history of coronary artery disease status post CABG in 2007 and stenting in 2011, hypertension, hyperlipidemia, diabetes, UTI, pyelonephritis and upper GI bleeding presenting today with complaint of right-sided chest pain. Patient states that last week she had a sinus infection and completed a course of antibiotics on Saturday. When she went to bed last night she was feeling her normal self. She woke up around 2 AM with a sharp uncomfortable pain in the right side of her chest that went around to her back and upper abdomen. She did have some burping associated with this but it has not gone away. It waxes and wanes but she does not remember ever feeling a pain like this before. She has felt tired and mildly nauseated since the symptoms started. She denies fever, cough or shortness of breath. She has had no vomiting or diarrhea. She did try nitroglycerin at home with only minimal improvement. Pain does seem to be worse with certain movements of her right arm and taking a deep breath.  She has had intermittent swelling in her lower external knees bilaterally for some time but they do not seem any different or worse today. No recent medication changes.   The history is provided by the patient.    Past Medical History:  Diagnosis Date  . Alcohol abuse    in the past, resolved  . Arthritis   . Asthma   . Blood transfusion without reported diagnosis 1984   after surgery  . Breast cancer [facility])    Double mastectomy 2007; s/p tamoxifen and arimidex therapy; no recurrence since 05/2008  . CAD (coronary artery disease)    Nuclear stress test Feb 11, 2011, EF 64%, no scar or ischemia    //         catheterization, November, 2011,patent LIMA-LAD-small caliber distal  vessel with diffuse plaque, patent SVG to OM1 and OM 2, patent SVG to PDA and PLA, occluded SVG to diagonal, medical therapy;  Lexiscan Myoview 6/14:  Inferior thinning, no ischemia, EF 61%, low risk  . CHF (congestive heart failure) ([place])   . Chronic back pain   . Diabetes mellitus   . Dyslipidemia   . Ejection fraction    EF 65%, echo, [place], Jan 16, 2011  . Hx of CABG    2007  . Hx of colonic polyps   . Hyperlipidemia   . Hypertension   . Myocardial infarction ([place])   . Nausea & vomiting    hospitalization, November, 2011, stricture GE junction, questionable stenosis gastrojejunostomy, disruptive primary peristaltic wave, GE reflux, delayed emptying proximal gastric pouch  . Normal cardiac stress test 02/2013  . Overweight(278.02)    stomach stapling 1985 followed by weight loss, then return of weight  . Sciatica   . Sleep apnea    CPAP being arranged May, 2011//does not use CPAP  . Tobacco abuse    in the past, resolved  . UTI (urinary tract infection)     Patient Active Problem List   Diagnosis Date Noted  . Asthma 03/07/2017  . Sleep apnea 03/07/2017  . Diabetes mellitus type 2, controlled ([place]) 03/07/2017  . Hyperlipidemia 03/07/2017  . CAD (coronary artery disease) with prior CABG 03/07/2017  . UTI (urinary  tract infection) 03/07/2017  . Sepsis ([place]) 03/07/2017  . Sepsis due to urinary tract infection ([place]) 03/07/2017  . HTN (hypertension) 03/07/2017  . Constipation by delayed colonic transit 03/07/2017  . Abnormal nuclear stress test 12/20/2015  . Angina, class III ([employer]) 12/20/2015  . Chest pain 11/12/2014  . Upper GI bleed 03/04/2013  . Atypical chest pain 01/07/2013  . History of tobacco abuse 01/07/2013  . SIRS (systemic inflammatory response syndrome) ([place]) 07/28/2012  . Tachycardia 07/28/2012  . GERD (gastroesophageal reflux disease) 02/24/2011  . Coronary artery disease involving native coronary artery with angina pectoris ([place])   . Hx of CABG   .  Overweight   . OSA (obstructive sleep apnea)   . Nausea & vomiting   . Breast cancer ([place])   . Hypertension   . Diabetes mellitus ([place])   . Dyslipidemia   . Ejection fraction     Past Surgical History:  Procedure Laterality Date  . ABDOMINAL HYSTERECTOMY  1989  . CARDIAC CATHETERIZATION N/A 12/20/2015   Procedure: Left Heart Cath and Cors/Grafts Angiography;  Surgeon: Leonie Man, MD;  Location: [place] CV LAB;  Service: Cardiovascular;  Laterality: N/A;  . CORONARY ARTERY BYPASS GRAFT  2009  . CORONARY STENT PLACEMENT    . GASTRIC BYPASS  1983   open.  [place]  . MASTECTOMY Bilateral 1987    OB History    No data available       Home Medications    Prior to Admission medications   Medication Sig Start Date End Date Taking? Authorizing Provider  albuterol (PROVENTIL HFA;VENTOLIN HFA) 108 (90 BASE) MCG/ACT inhaler Inhale 2 puffs into the lungs every 6 (six) hours as needed for shortness of breath.     [provider]  aspirin EC 81 MG tablet Take 81 mg by mouth daily.     [provider]  cetirizine (ZYRTEC) 10 MG tablet Take 10 mg by mouth daily as needed for allergies.    [provider]  docusate sodium (COLACE) 100 MG capsule Take 1 capsule (100 mg total) by mouth 2 (two) times daily. 03/09/17   Hosie Poisson, MD  flintstones complete (FLINTSTONES) 60 MG chewable tablet Chew 1 tablet by mouth daily.    [provider]  glipiZIDE (GLUCOTROL XL) 10 MG 24 hr tablet Take 1 tablet (10 mg total) by mouth daily with breakfast. 12/17/15   [name], NP  Hydrocodone-Acetaminophen (VICODIN HP) 10-300 MG TABS Take 1 tablet by mouth 2 (two) times daily as needed (pain).    [provider]  latanoprost (XALATAN) 0.005 % ophthalmic solution Place 1 drop into both eyes at bedtime.    [provider]  lisinopril-hydrochlorothiazide (PRINZIDE,ZESTORETIC) 20-12.5 MG per tablet Take 1 tablet by mouth daily.      [provider]  metoprolol tartrate (LOPRESSOR) 25 MG tablet take 1 tablet by mouth twice a day 04/30/17   [name], MD  montelukast (SINGULAIR) 10 MG tablet Take 10 mg by mouth daily.    [provider]  nitroGLYCERIN (NITROSTAT) 0.4 MG SL tablet Place 1 tablet (0.4 mg total) under the tongue every 5 (five) minutes as needed for chest pain. 01/15/17   [name], MD  rosuvastatin (CRESTOR) 5 MG tablet Take 5 mg by mouth at bedtime.    [provider]    Family History Family History  Problem Relation Age of Onset  . Hypertension Mother   . Heart disease Mother   .  Diabetes Mother   . Heart disease Father   . Colon cancer Father        does not know age of onset  . Heart disease Sister   . Colon cancer Sister 12  . Stomach cancer Sister   . Asthma Sister   . Esophageal cancer Neg Hx   . Rectal cancer Neg Hx     Social History Social History  Substance Use Topics  . Smoking status: Former Smoker    Packs/day: 3.00    Years: 27.00    Types: Cigarettes    Quit date: 09/14/2000  . Smokeless tobacco: Never Used  . Alcohol use No     Allergies   Latex   Review of Systems Review of Systems  All other systems reviewed and are negative.    Physical Exam Updated Vital Signs There were no vitals taken for this visit.  Physical Exam  Constitutional: She is oriented to person, place, and time. She appears well-developed and well-nourished. No distress.  HENT:  Head: Normocephalic and atraumatic.  Mouth/Throat: Oropharynx is clear and moist.  Eyes: Pupils are equal, round, and reactive to light. Conjunctivae and EOM are normal.  Neck: Normal range of motion. Neck supple.  Cardiovascular: Normal rate, regular rhythm and intact distal pulses.   No murmur heard. Pulmonary/Chest: Effort normal and breath sounds normal. No respiratory distress. She has no wheezes. She has no rales.  Abdominal: Soft. She exhibits no distension. There is no tenderness.  There is no rebound and no guarding.  Musculoskeletal: Normal range of motion. She exhibits no edema or tenderness.  Neurological: She is alert and oriented to person, place, and time.  Skin: Skin is warm and dry. No rash noted. No erythema.  Psychiatric: She has a normal mood and affect. Her behavior is normal.  Nursing note and vitals reviewed.    ED Treatments / Results  Labs (all labs ordered are listed, but only abnormal results are displayed) Labs Reviewed  CBC WITH DIFFERENTIAL/PLATELET - Abnormal; Notable for the following:       Result Value   Neutro Abs 1.5 (*)    All other components within normal limits  URINALYSIS, ROUTINE W REFLEX MICROSCOPIC - Abnormal; Notable for the following:    Color, Urine STRAW (*)    All other components within normal limits  COMPREHENSIVE METABOLIC PANEL  LIPASE, BLOOD  D-DIMER, QUANTITATIVE (NOT AT [facility])  I-STAT TROPONIN, ED    EKG  EKG Interpretation  Date/Time:  Monday June 07 2017 11:11:33 EDT Ventricular Rate:  50 PR Interval:  154 QRS Duration: 74 QT Interval:  440 QTC Calculation: 401 R Axis:   41 Text Interpretation:  Sinus bradycardia with sinus arrhythmia Nonspecific T wave abnormality No significant change since last tracing Confirmed by [name] [phone]) on 06/07/2017 11:15:48 AM       Radiology Dg Chest 2 View  Result Date: 06/07/2017 CLINICAL DATA:  New onset right chest pain. EXAM: CHEST  2 VIEW COMPARISON:  10/06/2016 FINDINGS: There is severe elevation of the right diaphragm unchanged in the prior exam. There is no focal parenchymal opacity. There is no pleural effusion or pneumothorax. The heart and mediastinal contours are unremarkable. There is evidence of prior CABG. The osseous structures are unremarkable. IMPRESSION: No active cardiopulmonary disease. Electronically Signed   By: Kathreen Devoid   On: 06/07/2017 12:35    Procedures Procedures (including critical care time)  Medications Ordered in  ED Medications - No data to display  Initial Impression / Assessment and Plan / ED Course  I have reviewed the triage vital signs and the nursing notes.  Pertinent labs & imaging results that were available during my care of the patient were reviewed by me and considered in my medical decision making (see chart for details).     Patient presenting with atypical chest pain with multiple medical problems. Pain seems to have a pleuritic component and located mostly in the right side of the chest and upper abdomen. For potential chest etiology such as a atypical MI symptoms versus PE versus pneumonia lower suspicion for pneumothorax. Patient has no infectious symptoms concerning for pneumonia. Also concern for possible right upper quadrant pathology or renal pathology. Patient did recently have a history of pyelonephritis possibility of that versus hepatitis versus gallbladder pathology. Patient does not have rash or signs of zoster.  CBC, CMP, troponin, chest x-ray, d-dimer pending. EKG is unchanged.  2:52 PM Labs reassuring.  CXR wnl.  Patient states she has had a lot of reflux type symptoms and burping does seem to help her symptoms. She is following up with her doctor to get placed on a PPI. With recent antibiotic use this also could irritate reflux. Given the rest of the labs are reassuring feel that patient is safe for discharge.  Final Clinical Impressions(s) / ED Diagnoses   Final diagnoses:  Atypical chest pain    New Prescriptions New Prescriptions   No medications on file     [name], MD 06/07/17 1454

## 2017-06-07 NOTE — ED Triage Notes (Signed)
Pt arrives from home via EMS reporting pt awaken by new onset R CP, radiating to RUE and back, tender to palpation.  Pt reports taking 1 NTG with reflief, pain went from 10/10 to 9/10.  Pt reports pain intermittent, describes as "crampy". Pt reports recent abx for sinus infection, denies cough.  Dr. Maryan Rued at bedside. Resp e/u.

## 2017-07-27 ENCOUNTER — Encounter (HOSPITAL_COMMUNITY): Payer: Self-pay | Admitting: Emergency Medicine

## 2017-07-27 ENCOUNTER — Emergency Department (HOSPITAL_COMMUNITY): Payer: Medicare Other

## 2017-07-27 ENCOUNTER — Emergency Department (HOSPITAL_COMMUNITY)
Admission: EM | Admit: 2017-07-27 | Discharge: 2017-07-28 | Disposition: A | Discharge status: Home / Self Care | Payer: Medicare Other | Attending: Emergency Medicine | Admitting: Emergency Medicine

## 2017-07-27 DIAGNOSIS — I509 Heart failure, unspecified: Secondary | ICD-10-CM | POA: Insufficient documentation

## 2017-07-27 DIAGNOSIS — Z91040 Latex allergy status: Secondary | ICD-10-CM | POA: Diagnosis not present

## 2017-07-27 DIAGNOSIS — J111 Influenza due to unidentified influenza virus with other respiratory manifestations: Secondary | ICD-10-CM | POA: Insufficient documentation

## 2017-07-27 DIAGNOSIS — Z79899 Other long term (current) drug therapy: Secondary | ICD-10-CM | POA: Insufficient documentation

## 2017-07-27 DIAGNOSIS — Z87891 Personal history of nicotine dependence: Secondary | ICD-10-CM | POA: Diagnosis not present

## 2017-07-27 DIAGNOSIS — J069 Acute upper respiratory infection, unspecified: Secondary | ICD-10-CM

## 2017-07-27 DIAGNOSIS — Z951 Presence of aortocoronary bypass graft: Secondary | ICD-10-CM | POA: Insufficient documentation

## 2017-07-27 DIAGNOSIS — Z7982 Long term (current) use of aspirin: Secondary | ICD-10-CM | POA: Insufficient documentation

## 2017-07-27 DIAGNOSIS — J45909 Unspecified asthma, uncomplicated: Secondary | ICD-10-CM | POA: Insufficient documentation

## 2017-07-27 DIAGNOSIS — I25119 Atherosclerotic heart disease of native coronary artery with unspecified angina pectoris: Secondary | ICD-10-CM | POA: Diagnosis not present

## 2017-07-27 DIAGNOSIS — I110 Hypertensive heart disease with heart failure: Secondary | ICD-10-CM | POA: Diagnosis not present

## 2017-07-27 DIAGNOSIS — Z7984 Long term (current) use of oral hypoglycemic drugs: Secondary | ICD-10-CM | POA: Diagnosis not present

## 2017-07-27 DIAGNOSIS — E119 Type 2 diabetes mellitus without complications: Secondary | ICD-10-CM | POA: Insufficient documentation

## 2017-07-27 DIAGNOSIS — Z853 Personal history of malignant neoplasm of breast: Secondary | ICD-10-CM | POA: Insufficient documentation

## 2017-07-27 DIAGNOSIS — R05 Cough: Secondary | ICD-10-CM | POA: Diagnosis present

## 2017-07-27 DIAGNOSIS — R69 Illness, unspecified: Secondary | ICD-10-CM

## 2017-07-27 DIAGNOSIS — B9789 Other viral agents as the cause of diseases classified elsewhere: Secondary | ICD-10-CM

## 2017-07-27 LAB — RAPID STREP SCREEN (MED CTR MEBANE ONLY): Streptococcus, Group A Screen (Direct): NEGATIVE

## 2017-07-27 MED ORDER — BENZONATATE 100 MG PO CAPS
100.0000 mg | ORAL_CAPSULE | Freq: Once | ORAL | Status: AC
  Administered 2017-07-27: 100 mg via ORAL
  Filled 2017-07-27: qty 1

## 2017-07-27 MED ORDER — ACETAMINOPHEN 325 MG PO TABS
650.0000 mg | ORAL_TABLET | Freq: Once | ORAL | Status: AC
  Administered 2017-07-27: 650 mg via ORAL
  Filled 2017-07-27: qty 2

## 2017-07-27 MED ORDER — ACETAMINOPHEN 500 MG PO TABS: 500.0000 mg | ORAL_TABLET | Freq: Four times a day (QID) | ORAL | 0 refills | Status: DC | PRN

## 2017-07-27 MED ORDER — BENZONATATE 100 MG PO CAPS
100.0000 mg | ORAL_CAPSULE | Freq: Three times a day (TID) | ORAL | 0 refills | Status: DC
Start: 2017-07-27 — End: 2017-08-16

## 2017-07-27 NOTE — ED Triage Notes (Signed)
Pt presents to ED for assessment after being diagnosed with a sinus infection at her PCP on Monday and started on Cefdinir.  Patient states today she began to have generalized chills, cough with productive green phlegm, sore throat.

## 2017-07-27 NOTE — ED Provider Notes (Signed)
[facility] EMERGENCY DEPARTMENT Provider Note   CSN: [id number] Arrival date & time: 07/27/17  2040     History   Chief Complaint Chief Complaint  Patient presents with  . URI    HPI Patricia Cuevas is a 66 y.o. female.  HPI Patricia Cuevas is a 66 y.o. female presents to emergency department complaining of cough, congestion, body aches, chills.  Patient states her symptoms began 4 days ago.  At that time she was seen by family doctor and was diagnosed with sinus infection.  She was started on cefdinir which she has had for 4 days.  She states that her symptoms are worsening.  She states now she is coughing up thick yellow sputum.  She reports body aches and chills.  She did not take her temperature at home.  She has not taken any other medications to help her with her symptoms.  She denies any nausea or vomiting.  No diarrhea.  No chest pain or shortness of breath.  No urinary symptoms.  She states nothing is making her symptoms better or worse. ' Past Medical History:  Diagnosis Date  . Alcohol abuse    in the past, resolved  . Arthritis   . Asthma   . Blood transfusion without reported diagnosis 1984   after surgery  . Breast cancer [facility])    Double mastectomy 2007; s/p tamoxifen and arimidex therapy; no recurrence since 05/2008  . CAD (coronary artery disease)    Nuclear stress test Feb 11, 2011, EF 64%, no scar or ischemia    //         catheterization, November, 2011,patent LIMA-LAD-small caliber distal vessel with diffuse plaque, patent SVG to OM1 and OM 2, patent SVG to PDA and PLA, occluded SVG to diagonal, medical therapy;  Lexiscan Myoview 6/14:  Inferior thinning, no ischemia, EF 61%, low risk  . CHF (congestive heart failure) ([place])   . Chronic back pain   . Diabetes mellitus   . Dyslipidemia   . Ejection fraction    EF 65%, echo, [place], Jan 16, 2011  . Hx of CABG    2007  . Hx of colonic polyps   . Hyperlipidemia   . Hypertension   .  Myocardial infarction ([place])   . Nausea & vomiting    hospitalization, November, 2011, stricture GE junction, questionable stenosis gastrojejunostomy, disruptive primary peristaltic wave, GE reflux, delayed emptying proximal gastric pouch  . Normal cardiac stress test 02/2013  . Overweight(278.02)    stomach stapling 1985 followed by weight loss, then return of weight  . Sciatica   . Sleep apnea    CPAP being arranged May, 2011//does not use CPAP  . Tobacco abuse    in the past, resolved  . UTI (urinary tract infection)     Patient Active Problem List   Diagnosis Date Noted  . Asthma 03/07/2017  . Sleep apnea 03/07/2017  . Diabetes mellitus type 2, controlled ([place]) 03/07/2017  . Hyperlipidemia 03/07/2017  . CAD (coronary artery disease) with prior CABG 03/07/2017  . UTI (urinary tract infection) 03/07/2017  . Sepsis ([place]) 03/07/2017  . Sepsis due to urinary tract infection ([place]) 03/07/2017  . HTN (hypertension) 03/07/2017  . Constipation by delayed colonic transit 03/07/2017  . Abnormal nuclear stress test 12/20/2015  . Angina, class III ([place]) 12/20/2015  . Chest pain 11/12/2014  . Upper GI bleed 03/04/2013  . Atypical chest pain 01/07/2013  . History of tobacco abuse 01/07/2013  .  SIRS (systemic inflammatory response syndrome) ([place]) 07/28/2012  . Tachycardia 07/28/2012  . GERD (gastroesophageal reflux disease) 02/24/2011  . Coronary artery disease involving native coronary artery with angina pectoris ([place])   . Hx of CABG   . Overweight   . OSA (obstructive sleep apnea)   . Nausea & vomiting   . Breast cancer ([place])   . Hypertension   . Diabetes mellitus ([place])   . Dyslipidemia   . Ejection fraction     Past Surgical History:  Procedure Laterality Date  . ABDOMINAL HYSTERECTOMY  1989  . CORONARY ARTERY BYPASS GRAFT  2009  . CORONARY STENT PLACEMENT    . GASTRIC BYPASS  1983   open.  [place]  . MASTECTOMY Bilateral 1987    OB History    No data available        Home Medications    Prior to Admission medications   Medication Sig Start Date End Date Taking? Authorizing Provider  albuterol (PROVENTIL HFA;VENTOLIN HFA) 108 (90 BASE) MCG/ACT inhaler Inhale 2 puffs into the lungs every 6 (six) hours as needed for shortness of breath.     [provider]  aspirin EC 81 MG tablet Take 81 mg by mouth daily.     [provider]  cetirizine (ZYRTEC) 10 MG tablet Take 10 mg by mouth daily as needed for allergies.    [provider]  docusate sodium (COLACE) 100 MG capsule Take 1 capsule (100 mg total) by mouth 2 (two) times daily. 03/09/17   Hosie Poisson, MD  flintstones complete (FLINTSTONES) 60 MG chewable tablet Chew 1 tablet by mouth daily.    [provider]  glipiZIDE (GLUCOTROL XL) 10 MG 24 hr tablet Take 1 tablet (10 mg total) by mouth daily with breakfast. 12/17/15   [name], NP  Hydrocodone-Acetaminophen (VICODIN HP) 10-300 MG TABS Take 1 tablet by mouth 2 (two) times daily as needed (pain).    [provider]  latanoprost (XALATAN) 0.005 % ophthalmic solution Place 1 drop into both eyes at bedtime.    [provider]  lisinopril-hydrochlorothiazide (PRINZIDE,ZESTORETIC) 20-12.5 MG per tablet Take 1 tablet by mouth daily.      [provider]  metoprolol tartrate (LOPRESSOR) 25 MG tablet take 1 tablet by mouth twice a day 04/30/17   [name], MD  montelukast (SINGULAIR) 10 MG tablet Take 10 mg by mouth daily.    [provider]  nitroGLYCERIN (NITROSTAT) 0.4 MG SL tablet Place 1 tablet (0.4 mg total) under the tongue every 5 (five) minutes as needed for chest pain. 01/15/17   [name], MD  rosuvastatin (CRESTOR) 5 MG tablet Take 5 mg by mouth at bedtime.    [provider]    Family History Family History  Problem Relation Age of Onset  . Hypertension Mother   . Heart disease Mother   . Diabetes Mother   . Heart disease Father   . Colon cancer  Father        does not know age of onset  . Heart disease Sister   . Colon cancer Sister 69  . Stomach cancer Sister   . Asthma Sister   . Esophageal cancer Neg Hx   . Rectal cancer Neg Hx     Social History Social History   Tobacco Use  . Smoking status: Former Smoker    Packs/day: 3.00    Years: 27.00    Pack years: 81.00    Types: Cigarettes  Last attempt to quit: 09/14/2000    Years since quitting: 16.8  . Smokeless tobacco: Never Used  Substance Use Topics  . Alcohol use: No  . Drug use: No     Allergies   Latex   Review of Systems Review of Systems  Constitutional: Positive for chills. Negative for fever.  HENT: Positive for congestion, sinus pressure, sneezing and sore throat. Negative for ear pain, trouble swallowing and voice change.   Respiratory: Positive for cough. Negative for chest tightness and shortness of breath.   Cardiovascular: Negative for chest pain, palpitations and leg swelling.  Gastrointestinal: Negative for abdominal pain, diarrhea, nausea and vomiting.  Genitourinary: Negative for dysuria, flank pain, pelvic pain, vaginal bleeding, vaginal discharge and vaginal pain.  Musculoskeletal: Positive for myalgias. Negative for arthralgias, neck pain and neck stiffness.  Skin: Negative for rash.  Neurological: Negative for dizziness, weakness and headaches.  All other systems reviewed and are negative.    Physical Exam Updated Vital Signs BP (!) 156/81 (BP Location: Right Arm)   Pulse 63   Temp 98.4 F (36.9 C) (Oral)   Resp 15   Ht 4\' 11"  (1.499 m)   Wt 96.6 kg (213 lb)   SpO2 99%   BMI 43.02 kg/m   Physical Exam  Constitutional: She is oriented to person, place, and time. She appears well-developed and well-nourished. No distress.  HENT:  Head: Normocephalic and atraumatic.  Right Ear: Tympanic membrane, external ear and ear canal normal.  Left Ear: Tympanic membrane, external ear and ear canal normal.  Nose: Mucosal edema and  rhinorrhea present.  Mouth/Throat: Uvula is midline and mucous membranes are normal. Posterior oropharyngeal erythema present. No oropharyngeal exudate, posterior oropharyngeal edema or tonsillar abscesses.  Eyes: Conjunctivae are normal.  Neck: Neck supple.  Cardiovascular: Normal rate, regular rhythm, normal heart sounds and intact distal pulses.  Pulmonary/Chest: Effort normal and breath sounds normal. No respiratory distress. She has no wheezes. She has no rales.  Abdominal: She exhibits no distension.  Musculoskeletal: Normal range of motion.  Neurological: She is alert and oriented to person, place, and time.  Skin: Skin is warm and dry.  Psychiatric: She has a normal mood and affect.  Nursing note and vitals reviewed.    ED Treatments / Results  Labs (all labs ordered are listed, but only abnormal results are displayed) Labs Reviewed  RAPID STREP SCREEN (NOT AT [facility])  CULTURE, GROUP A STREP [facility])    EKG  EKG Interpretation None       Radiology Dg Chest 2 View  Result Date: 07/27/2017 CLINICAL DATA:  Productive cough. EXAM: CHEST  2 VIEW COMPARISON:  June 07, 2017 FINDINGS: The heart size and mediastinal contours are stable. Patient status post prior median sternotomy and CABG. There is chronic elevation of right hemidiaphragm unchanged. The lungs are clear. The visualized skeletal structures are stable. IMPRESSION: No active cardiopulmonary disease. Electronically Signed   By: Abelardo Diesel M.D.   On: 07/27/2017 21:13    Procedures Procedures (including critical care time)  Medications Ordered in ED Medications  acetaminophen (TYLENOL) tablet 650 mg (not administered)  benzonatate (TESSALON) capsule 100 mg (not administered)     Initial Impression / Assessment and Plan / ED Course  I have reviewed the triage vital signs and the nursing notes.  Pertinent labs & imaging results that were available during my care of the patient were reviewed by me and  considered in my medical decision making (see chart for details).  Patient in emergency department with flulike symptoms.  She is afebrile, nontoxic-appearing.  Blood pressure slightly elevated.  She is not tachycardic or tachypneic.  No concern for sepsis.  She is already on Ceftin ear, for presumable for sinus infection, although her symptoms are most consistent with viral upper respiratory tract infection.  Her chest x-ray was negative.  Rapid strep which was obtained at triage is negative as well.  We discussed this being a possibility of flu versus a viral URI.  Will treat symptomatically.  I did instruct her to finish her Cefdinir.  We will add Tessalon and Tylenol.  Follow-up with family doctor in 2-3 days.  Return precautions discussed.  Vitals:   07/27/17 2045  BP: (!) 156/81  Pulse: 63  Resp: 15  Temp: 98.4 F (36.9 C)  TempSrc: Oral  SpO2: 99%  Weight: 96.6 kg (213 lb)  Height: 4\' 11"  (1.499 m)    Final Clinical Impressions(s) / ED Diagnoses   Final diagnoses:  Influenza-like illness  Viral URI with cough    ED Discharge Orders        Ordered    benzonatate (TESSALON) 100 MG capsule  Every 8 hours     07/27/17 2350    acetaminophen (TYLENOL) 500 MG tablet  Every 6 hours PRN     07/27/17 2350       Jeannett Senior, PA-C 07/27/17 2351    Forde Dandy, MD 07/27/17 2357

## 2017-07-27 NOTE — Discharge Instructions (Signed)
Please finish your antibiotic as prescribed by your doctor.  Take Tylenol for body aches and fever.  Take Tessalon as prescribed as needed for cough.  Rest.  Drink plenty of fluids.  Follow-up with your family doctor in 2-3 days.  Your chest x-ray today was normal.  Your rapid strep screen was negative.  Please return if worsening symptoms.

## 2017-07-28 NOTE — ED Notes (Signed)
Patient verbalized understanding of discharge instructions and denies any further needs or questions at this time. VS stable. Patient ambulatory with steady gait.  

## 2017-07-30 LAB — CULTURE, GROUP A STREP (THRC)

## 2017-08-16 ENCOUNTER — Emergency Department (HOSPITAL_COMMUNITY): Payer: Medicare Other

## 2017-08-16 ENCOUNTER — Emergency Department (HOSPITAL_COMMUNITY)
Admission: EM | Admit: 2017-08-16 | Discharge: 2017-08-16 | Disposition: A | Discharge status: Home / Self Care | Payer: Medicare Other | Attending: Emergency Medicine | Admitting: Emergency Medicine

## 2017-08-16 ENCOUNTER — Encounter (HOSPITAL_COMMUNITY): Payer: Self-pay | Admitting: *Deleted

## 2017-08-16 ENCOUNTER — Other Ambulatory Visit: Payer: Self-pay

## 2017-08-16 DIAGNOSIS — Z951 Presence of aortocoronary bypass graft: Secondary | ICD-10-CM | POA: Diagnosis not present

## 2017-08-16 DIAGNOSIS — Z91040 Latex allergy status: Secondary | ICD-10-CM | POA: Diagnosis not present

## 2017-08-16 DIAGNOSIS — J45909 Unspecified asthma, uncomplicated: Secondary | ICD-10-CM | POA: Insufficient documentation

## 2017-08-16 DIAGNOSIS — Z7982 Long term (current) use of aspirin: Secondary | ICD-10-CM | POA: Insufficient documentation

## 2017-08-16 DIAGNOSIS — Z209 Contact with and (suspected) exposure to unspecified communicable disease: Secondary | ICD-10-CM | POA: Diagnosis not present

## 2017-08-16 DIAGNOSIS — I25119 Atherosclerotic heart disease of native coronary artery with unspecified angina pectoris: Secondary | ICD-10-CM | POA: Insufficient documentation

## 2017-08-16 DIAGNOSIS — J069 Acute upper respiratory infection, unspecified: Secondary | ICD-10-CM | POA: Diagnosis not present

## 2017-08-16 DIAGNOSIS — Z853 Personal history of malignant neoplasm of breast: Secondary | ICD-10-CM | POA: Insufficient documentation

## 2017-08-16 DIAGNOSIS — R0982 Postnasal drip: Secondary | ICD-10-CM | POA: Insufficient documentation

## 2017-08-16 DIAGNOSIS — I509 Heart failure, unspecified: Secondary | ICD-10-CM | POA: Diagnosis not present

## 2017-08-16 DIAGNOSIS — B9789 Other viral agents as the cause of diseases classified elsewhere: Secondary | ICD-10-CM

## 2017-08-16 DIAGNOSIS — Z87891 Personal history of nicotine dependence: Secondary | ICD-10-CM | POA: Insufficient documentation

## 2017-08-16 DIAGNOSIS — J3489 Other specified disorders of nose and nasal sinuses: Secondary | ICD-10-CM | POA: Diagnosis not present

## 2017-08-16 DIAGNOSIS — I110 Hypertensive heart disease with heart failure: Secondary | ICD-10-CM | POA: Diagnosis not present

## 2017-08-16 DIAGNOSIS — R05 Cough: Secondary | ICD-10-CM | POA: Diagnosis present

## 2017-08-16 LAB — CBC WITH DIFFERENTIAL/PLATELET
Basophils Absolute: 0 10*3/uL (ref 0.0–0.1)
Basophils Relative: 0 %
EOS PCT: 2 %
Eosinophils Absolute: 0.1 10*3/uL (ref 0.0–0.7)
HCT: 38.8 % (ref 36.0–46.0)
Hemoglobin: 12.3 g/dL (ref 12.0–15.0)
LYMPHS PCT: 52 %
Lymphs Abs: 2.5 10*3/uL (ref 0.7–4.0)
MCH: 27.8 pg (ref 26.0–34.0)
MCHC: 31.7 g/dL (ref 30.0–36.0)
MCV: 87.8 fL (ref 78.0–100.0)
Monocytes Absolute: 0.5 10*3/uL (ref 0.1–1.0)
Monocytes Relative: 11 %
NEUTROS ABS: 1.7 10*3/uL (ref 1.7–7.7)
NEUTROS PCT: 35 %
PLATELETS: 194 10*3/uL (ref 150–400)
RBC: 4.42 MIL/uL (ref 3.87–5.11)
RDW: 15.1 % (ref 11.5–15.5)
WBC: 4.8 10*3/uL (ref 4.0–10.5)

## 2017-08-16 LAB — BASIC METABOLIC PANEL
ANION GAP: 8 (ref 5–15)
BUN: 10 mg/dL (ref 6–20)
CO2: 24 mmol/L (ref 22–32)
Calcium: 8.9 mg/dL (ref 8.9–10.3)
Chloride: 106 mmol/L (ref 101–111)
Creatinine, Ser: 0.8 mg/dL (ref 0.44–1.00)
Glucose, Bld: 189 mg/dL — ABNORMAL HIGH (ref 65–99)
Potassium: 4.8 mmol/L (ref 3.5–5.1)
SODIUM: 138 mmol/L (ref 135–145)

## 2017-08-16 MED ORDER — FLUTICASONE PROPIONATE 50 MCG/ACT NA SUSP: 2.0000 | Freq: Every day | NASAL | 0 refills | Status: AC

## 2017-08-16 MED ORDER — CETIRIZINE HCL 10 MG PO TABS: 10.0000 mg | ORAL_TABLET | Freq: Every day | ORAL | 0 refills | Status: DC

## 2017-08-16 MED ORDER — BENZONATATE 100 MG PO CAPS: 100.0000 mg | ORAL_CAPSULE | Freq: Three times a day (TID) | ORAL | 0 refills | Status: DC

## 2017-08-16 NOTE — ED Provider Notes (Signed)
[facility] EMERGENCY DEPARTMENT Provider Note   CSN: [id number] Arrival date & time: 08/16/17  0455     History   Chief Complaint Chief Complaint  Patient presents with  . Cough    HPI Patricia Cuevas is a 66 y.o. female with a past medical history of CHF, MI, hypertension, alcohol abuse, diabetes, hypertension who presents to the emergency department today for URI like symptoms since 08/11/17.  Patient states that since Wednesday she has been having sinus pressure, rhinorrhea, right ear pressure, postnasal drip, productive cough with green/yellow sputum. The patient notes that she has been taking care of her grandchildren (4) at home and they all have viral URIs per pediatrician. She has been taking over the counter sinus medication for this without any relief. She notes that she was seen here last month for similar but her symptoms resolved after roughly one week. The patient cough is worse at night when she lies down.  Patient denies any fever, chills, myalgias, arthralgias, night sweats, weight loss, travel, shortness of breath, dyspnea on exertion, chest pain, hemoptysis, paroxysmal nocturnal dyspnea, leg swelling, tinnitus, ear discharge, numbness/tingiling, extremity weakness, N/V, or difficulty with speech.  History of tobacco use but does not use in greater than 30 years.  No new medications.  Not on home O2.  HPI  Past Medical History:  Diagnosis Date  . Alcohol abuse    in the past, resolved  . Arthritis   . Asthma   . Blood transfusion without reported diagnosis 1984   after surgery  . Breast cancer [facility])    Double mastectomy 2007; s/p tamoxifen and arimidex therapy; no recurrence since 05/2008  . CAD (coronary artery disease)    Nuclear stress test Feb 11, 2011, EF 64%, no scar or ischemia    //         catheterization, November, 2011,patent LIMA-LAD-small caliber distal vessel with diffuse plaque, patent SVG to OM1 and OM 2, patent SVG to PDA and PLA,  occluded SVG to diagonal, medical therapy;  Lexiscan Myoview 6/14:  Inferior thinning, no ischemia, EF 61%, low risk  . CHF (congestive heart failure) ([place])   . Chronic back pain   . Diabetes mellitus   . Dyslipidemia   . Ejection fraction    EF 65%, echo, [place], Jan 16, 2011  . Hx of CABG    2007  . Hx of colonic polyps   . Hyperlipidemia   . Hypertension   . Myocardial infarction ([place])   . Nausea & vomiting    hospitalization, November, 2011, stricture GE junction, questionable stenosis gastrojejunostomy, disruptive primary peristaltic wave, GE reflux, delayed emptying proximal gastric pouch  . Normal cardiac stress test 02/2013  . Overweight(278.02)    stomach stapling 1985 followed by weight loss, then return of weight  . Sciatica   . Sleep apnea    CPAP being arranged May, 2011//does not use CPAP  . Tobacco abuse    in the past, resolved  . UTI (urinary tract infection)     Patient Active Problem List   Diagnosis Date Noted  . Asthma 03/07/2017  . Sleep apnea 03/07/2017  . Diabetes mellitus type 2, controlled ([place]) 03/07/2017  . Hyperlipidemia 03/07/2017  . CAD (coronary artery disease) with prior CABG 03/07/2017  . UTI (urinary tract infection) 03/07/2017  . Sepsis ([place]) 03/07/2017  . Sepsis due to urinary tract infection ([place]) 03/07/2017  . HTN (hypertension) 03/07/2017  . Constipation by delayed colonic transit 03/07/2017  .  Abnormal nuclear stress test 12/20/2015  . Angina, class III ([place]) 12/20/2015  . Chest pain 11/12/2014  . Upper GI bleed 03/04/2013  . Atypical chest pain 01/07/2013  . History of tobacco abuse 01/07/2013  . SIRS (systemic inflammatory response syndrome) ([place]) 07/28/2012  . Tachycardia 07/28/2012  . GERD (gastroesophageal reflux disease) 02/24/2011  . Coronary artery disease involving native coronary artery with angina pectoris ([place])   . Hx of CABG   . Overweight   . OSA (obstructive sleep apnea)   . Nausea & vomiting   . Breast  cancer ([place])   . Hypertension   . Diabetes mellitus ([place])   . Dyslipidemia   . Ejection fraction     Past Surgical History:  Procedure Laterality Date  . ABDOMINAL HYSTERECTOMY  1989  . CARDIAC CATHETERIZATION N/A 12/20/2015   Procedure: Left Heart Cath and [facility];  Surgeon: Leonie Man, MD;  Location: [facility];  Service: Cardiovascular;  Laterality: N/A;  . CORONARY ARTERY BYPASS GRAFT  2009  . CORONARY STENT PLACEMENT    . GASTRIC BYPASS  1983   open.  [place]  . MASTECTOMY Bilateral 1987    OB History    No data available       Home Medications    Prior to Admission medications   Medication Sig Start Date End Date Taking? Authorizing Provider  acetaminophen (TYLENOL) 500 MG tablet Take 1 tablet (500 mg total) every 6 (six) hours as needed by mouth. 07/27/17   Kirichenko, Tatyana, PA-C  albuterol (PROVENTIL HFA;VENTOLIN HFA) 108 (90 BASE) MCG/ACT inhaler Inhale 2 puffs into the lungs every 6 (six) hours as needed for shortness of breath.     [provider]  aspirin EC 81 MG tablet Take 81 mg by mouth daily.     [provider]  benzonatate (TESSALON) 100 MG capsule Take 1 capsule (100 mg total) every 8 (eight) hours by mouth. 07/27/17   Kirichenko, Tatyana, PA-C  cetirizine (ZYRTEC) 10 MG tablet Take 10 mg by mouth daily as needed for allergies.    [provider]  docusate sodium (COLACE) 100 MG capsule Take 1 capsule (100 mg total) by mouth 2 (two) times daily. 03/09/17   Hosie Poisson, MD  flintstones complete (FLINTSTONES) 60 MG chewable tablet Chew 1 tablet by mouth daily.    [provider]  glipiZIDE (GLUCOTROL XL) 10 MG 24 hr tablet Take 1 tablet (10 mg total) by mouth daily with breakfast. 12/17/15   [name], NP  Hydrocodone-Acetaminophen (VICODIN HP) 10-300 MG TABS Take 1 tablet by mouth 2 (two) times daily as needed (pain).    [provider]  latanoprost (XALATAN) 0.005 % ophthalmic  solution Place 1 drop into both eyes at bedtime.    [provider]  lisinopril-hydrochlorothiazide (PRINZIDE,ZESTORETIC) 20-12.5 MG per tablet Take 1 tablet by mouth daily.      [provider]  metoprolol tartrate (LOPRESSOR) 25 MG tablet take 1 tablet by mouth twice a day 04/30/17   [name], MD  montelukast (SINGULAIR) 10 MG tablet Take 10 mg by mouth daily.    [provider]  nitroGLYCERIN (NITROSTAT) 0.4 MG SL tablet Place 1 tablet (0.4 mg total) under the tongue every 5 (five) minutes as needed for chest pain. 01/15/17   [name], MD  rosuvastatin (CRESTOR) 5 MG tablet Take 5 mg by mouth at bedtime.    [provider]    Family History Family History  Problem Relation Age of Onset  . Hypertension Mother   . Heart disease Mother   . Diabetes Mother   . Heart disease Father   . Colon cancer Father        does not know age of onset  . Heart disease Sister   . Colon cancer Sister 85  . Stomach cancer Sister   . Asthma Sister   . Esophageal cancer Neg Hx   . Rectal cancer Neg Hx     Social History Social History   Tobacco Use  . Smoking status: Former Smoker    Packs/day: 3.00    Years: 27.00    Pack years: 81.00    Types: Cigarettes    Last attempt to quit: 09/14/2000    Years since quitting: 16.9  . Smokeless tobacco: Never Used  Substance Use Topics  . Alcohol use: No  . Drug use: No     Allergies   Latex   Review of Systems Review of Systems  Constitutional: Negative for chills.  HENT: Positive for congestion and ear pain.   Respiratory: Positive for cough. Negative for chest tightness and shortness of breath.   Gastrointestinal: Negative for nausea and vomiting.  All other systems reviewed and are negative.    Physical Exam Updated Vital Signs BP 124/79   Pulse 68   Temp 98.2 F (36.8 C) (Oral)   Resp 18   Ht 4\' 11"  (1.499 m)   Wt 97.5 kg (215 lb)   SpO2 95%   BMI 43.42 kg/m   Physical Exam    Constitutional: She appears well-developed and well-nourished.  HENT:  Head: Normocephalic and atraumatic.  Right Ear: Hearing, tympanic membrane, external ear and ear canal normal.  Left Ear: Hearing, tympanic membrane, external ear and ear canal normal.  Nose: Mucosal edema and rhinorrhea present. Right sinus exhibits no maxillary sinus tenderness and no frontal sinus tenderness. Left sinus exhibits no maxillary sinus tenderness and no frontal sinus tenderness.  Mouth/Throat: Uvula is midline, oropharynx is clear and moist and mucous membranes are normal. No tonsillar exudate.  The patient has normal phonation and is in control of secretions. No stridor.  Midline uvula without edema. Soft palate rises symmetrically.  No tonsillar erythema or exudates. No PTA. Cobblestoning. Tongue protrusion is normal. No trismus. No creptius on neck palpation and patient has good dentition. No gingival erythema or fluctuance noted. Mucus membranes moist.   Eyes: Pupils are equal, round, and reactive to light. Right eye exhibits no discharge. Left eye exhibits no discharge. No scleral icterus.  Neck: Trachea normal, normal range of motion and full passive range of motion without pain. Neck supple. No JVD present. No spinous process tenderness present. Carotid bruit is not present. No neck rigidity. Normal range of motion present.  No meningismus  Cardiovascular: Normal rate, regular rhythm and intact distal pulses.  No murmur heard. Pulses:      Radial pulses are 2+ on the right side, and 2+ on the left side.       Dorsalis pedis pulses are 2+ on the right side, and 2+ on the left side.       Posterior tibial pulses are 2+ on the right side, and 2+ on the left side.  No lower extremity swelling or edema. Calves symmetric in size bilaterally.  Pulmonary/Chest: Effort normal and breath sounds normal. She exhibits no tenderness.  Abdominal: Soft. Bowel sounds are normal. There is no tenderness. There is no  rebound and no guarding.  Musculoskeletal:  She exhibits no edema.  Lymphadenopathy:       Head (right side): No submental, no submandibular and no tonsillar adenopathy present.       Head (left side): No submental, no submandibular and no tonsillar adenopathy present.    She has no cervical adenopathy.  Neurological: She is alert.  Skin: Skin is warm and dry. No rash noted. She is not diaphoretic.  Psychiatric: She has a normal mood and affect.  Nursing note and vitals reviewed.    ED Treatments / Results  Labs (all labs ordered are listed, but only abnormal results are displayed) Labs Reviewed  CULTURE, GROUP A STREP ([place])  CBC WITH DIFFERENTIAL/PLATELET  BASIC METABOLIC PANEL    EKG  EKG Interpretation None       Radiology Dg Chest 2 View  Result Date: 08/16/2017 CLINICAL DATA:  66 year old female with cough. EXAM: CHEST  2 VIEW COMPARISON:  Chest radiograph dated 07/27/2017 FINDINGS: There is stable eventration of the right hemidiaphragm. The lungs are clear. There is no pleural effusion or pneumothorax. The cardiac silhouette is within normal limits. Median sternotomy wires and CABG vascular clips noted. Right axillary and chest wall surgical clips. No acute osseous pathology. IMPRESSION: No active cardiopulmonary disease. Electronically Signed   By: Anner Crete M.D.   On: 08/16/2017 05:58    Procedures Procedures (including critical care time)  Medications Ordered in ED Medications - No data to display   Initial Impression / Assessment and Plan / ED Course  I have reviewed the triage vital signs and the nursing notes.  Pertinent labs & imaging results that were available during my care of the patient were reviewed by me and considered in my medical decision making (see chart for details).     66 year old female presented with sinus pressure, right ear pressure, postnasal drip, cough.  Pt CXR negative for acute infiltrate. Patients symptoms are consistent  with URI, likely viral etiology. Discussed that antibiotics are not indicated for viral infections. Pt will be discharged with symptomatic treatment.  Verbalizes understanding and is agreeable with plan. Strict return precautions discussed. Pt is hemodynamically stable & in NAD prior to dc.  Patient case seen and discussed with Dr. Venora Maples who is in agreement with plan.   Final Clinical Impressions(s) / ED Diagnoses   Final diagnoses:  Viral URI with cough    ED Discharge Orders        Ordered    fluticasone (FLONASE) 50 MCG/ACT nasal spray  Daily     08/16/17 1016    cetirizine (ZYRTEC) 10 MG tablet  Daily     08/16/17 1016    benzonatate (TESSALON) 100 MG capsule  Every 8 hours     08/16/17 1016       Lorelle Gibbs 08/16/17 [place], Kevin, MD 08/16/17 1117

## 2017-08-16 NOTE — ED Triage Notes (Signed)
The pt has had a cough cold since Thursday with upper respiratory symptoms with drainage from her sinuses rt earache  Productive cough

## 2017-08-16 NOTE — ED Notes (Signed)
Unsuccessful attempt to draw blood.  Notified Phlebotomy, will attempt to draw.

## 2017-08-16 NOTE — Discharge Instructions (Signed)
Please read and follow all provided instructions.  Your diagnoses today include:  1. Viral URI with cough     Tests performed today include: Vital signs. See below for your results today.  CXR - negative  Medications prescribed/advised: 1. Musinex [Guaifenesin] as a decongestant [thin mucus - you have to be well hydrated when taking this for it to work] - you can pick this up over the counter.  2. Tylenol for fever/pain and Motrin/Ibuprofen for muscle aches 3. Flonase Steroid Nasal Spray. This does not work to maximum capability unless used daily >1-2 weeks.  4. Allegra or Zyrtec: This medication is an allergy medication that may aid in helping to relieve your symptoms. Please take daily and discuss with your PCP if you should remain on this medication at follow up. If you were prescribed a allergy medication (allegra) please take daily.  5. Tessalon: Take for cough suppressant as needed.  Home care instructions:  An upper respiratory infection (URI) is also sometimes known as the common cold. Most people improve within 1 week, but symptoms can last up to 2 weeks. A residual cough may last even longer.   URI is most commonly caused by a virus. Viruses are NOT treated with antibiotics. You can easily spread the virus to others by oral contact. This includes kissing, sharing a glass, coughing, or sneezing. Touching your mouth or nose and then touching a surface, which is then touched by another person, can also spread the virus.   TREATMENT  Treatment is directed at relieving symptoms. There is no cure. Antibiotics are not effective, because the infection is caused by a virus, not by bacteria. Treatment may include:  Increased fluid intake. Sports drinks offer valuable electrolytes, sugars, and fluids.  Breathing heated mist or steam (vaporizer or shower).  Eating chicken soup or other clear broths, and maintaining good nutrition.  Getting plenty of rest.  Using gargles or lozenges for  comfort.  Controlling fevers with ibuprofen or acetaminophen as directed by your caregiver.  Increasing usage of your inhaler if you have asthma.  Return to work when your temperature has returned to normal.   Follow-up instructions: Followup with your primary care doctor in 4 days if your symptoms persist.  Your more than welcome to return to the emergency department if symptoms worsen or become concerning.  Return instructions:  Please return to the Emergency Department if you do not get better, if you get worse, or new symptoms OR  - Fever (temperature greater than 101.98F)  - Bleeding that does not stop with holding pressure to the area    -Severe pain (please note that you may be more sore the day after your accident)  - Chest Pain  - Difficulty breathing (worsening shortness of breath with sputum production may  be a sign of pneumonia.   - Severe nausea or vomiting  - Inability to tolerate food and liquids  - Passing out  - Skin becoming red around your wounds  - Change in mental status (confusion or lethargy)  - New numbness or weakness     -You develop fever, swollen neck glands, pain with swallowing or white areas on  the back of your throat. This may be a sign of strep throat.  Please return if you have any other emergent concerns.  Additional Information:  Your vital signs today were: BP 124/79    Pulse 68    Temp 98.2 F (36.8 C) (Oral)    Resp 18    Ht  4\' 11"  (1.499 m)    Wt 97.5 kg (215 lb)    SpO2 95%    BMI 43.42 kg/m  If your blood pressure (BP) was elevated above 135/85 this visit, please have this repeated by your doctor within one month.

## 2018-01-21 ENCOUNTER — Ambulatory Visit (INDEPENDENT_AMBULATORY_CARE_PROVIDER_SITE_OTHER): Payer: Medicare Other | Admitting: Cardiology

## 2018-01-21 ENCOUNTER — Encounter: Payer: Self-pay | Admitting: Cardiology

## 2018-01-21 VITALS — BP 122/68 | HR 65 | Ht 59.0 in | Wt 211.0 lb

## 2018-01-21 DIAGNOSIS — I209 Angina pectoris, unspecified: Secondary | ICD-10-CM | POA: Diagnosis not present

## 2018-01-21 DIAGNOSIS — R0789 Other chest pain: Secondary | ICD-10-CM

## 2018-01-21 DIAGNOSIS — I2583 Coronary atherosclerosis due to lipid rich plaque: Secondary | ICD-10-CM | POA: Diagnosis not present

## 2018-01-21 DIAGNOSIS — I2510 Atherosclerotic heart disease of native coronary artery without angina pectoris: Secondary | ICD-10-CM | POA: Diagnosis not present

## 2018-01-21 DIAGNOSIS — Z951 Presence of aortocoronary bypass graft: Secondary | ICD-10-CM

## 2018-01-21 MED ORDER — ISOSORBIDE MONONITRATE ER 30 MG PO TB24: 30.0000 mg | ORAL_TABLET | Freq: Every day | ORAL | 6 refills | Status: DC

## 2018-01-21 NOTE — Progress Notes (Signed)
Cardiology Office Note   Date:  01/21/2018   ID:  Patricia Cuevas, DOB [birth year], MRN [id number]  PCP:  [name], MD  Cardiologist:   Candee Furbish, MD     History of Present Illness: Patricia Cuevas is a 67 y.o. female former patient of Dr. Ron Parker here for follow-up appointment.  Has coronary disease post bypass surgery in 2007 with catheterization in 2011 and 2017 demonstrating patent LIMA to LAD, small caliber vessel distally diffuse plaque with patent SVG to OM1 and OM 2, patent SVG to PDA and PLA, occluded SVG to diagonal. Last cath in 2017 secondary to abnormal NUC stress.   She seeks the majority of her care in the emergency department.  Went to ER 01/29/2016 - abdominal pain, ruq [place] normal.   Multiple episodes of atypical chest pain. No CP, no SOB, no edema.   Compliant with meds.  Normal EF.  01/15/17-she was in the emergency room on 11/16/16 and 10/06/16 with nonspecific chest pain and right shoulder pain. Reassuring workup. Last catheterization 12/20/15. See below. Fell and problems with right shoulder, pain in neck as well.  01/21/18 -having some more SOB with walking, mild pressure. A few months. Sometimes at rest.  Reviewed her prior cardiac catheterization that was reassuring.  2017.   Past Medical History:  Diagnosis Date  . Alcohol abuse    in the past, resolved  . Arthritis   . Asthma   . Blood transfusion without reported diagnosis 1984   after surgery  . Breast cancer [facility])    Double mastectomy 2007; s/p tamoxifen and arimidex therapy; no recurrence since 05/2008  . CAD (coronary artery disease)    Nuclear stress test Feb 11, 2011, EF 64%, no scar or ischemia    //         catheterization, November, 2011,patent LIMA-LAD-small caliber distal vessel with diffuse plaque, patent SVG to OM1 and OM 2, patent SVG to PDA and PLA, occluded SVG to diagonal, medical therapy;  Lexiscan Myoview 6/14:  Inferior thinning, no ischemia, EF 61%, low risk  . CHF (congestive  heart failure) ([place])   . Chronic back pain   . Diabetes mellitus   . Dyslipidemia   . Ejection fraction    EF 65%, echo, [place], Jan 16, 2011  . Hx of CABG    2007  . Hx of colonic polyps   . Hyperlipidemia   . Hypertension   . Myocardial infarction ([place])   . Nausea & vomiting    hospitalization, November, 2011, stricture GE junction, questionable stenosis gastrojejunostomy, disruptive primary peristaltic wave, GE reflux, delayed emptying proximal gastric pouch  . Normal cardiac stress test 02/2013  . Overweight(278.02)    stomach stapling 1985 followed by weight loss, then return of weight  . Sciatica   . Sleep apnea    CPAP being arranged May, 2011//does not use CPAP  . Tobacco abuse    in the past, resolved  . UTI (urinary tract infection)     Past Surgical History:  Procedure Laterality Date  . ABDOMINAL HYSTERECTOMY  1989  . CARDIAC CATHETERIZATION N/A 12/20/2015   Procedure: Left Heart Cath and Cors/Grafts Angiography;  Surgeon: Leonie Man, MD;  Location: [facility];  Service: Cardiovascular;  Laterality: N/A;  . CORONARY ARTERY BYPASS GRAFT  2009  . CORONARY STENT PLACEMENT    . GASTRIC BYPASS  1983   open.  [place]  . MASTECTOMY Bilateral 1987     Current  Outpatient Medications  Medication Sig Dispense Refill  . acetaminophen (TYLENOL) 500 MG tablet Take 1 tablet (500 mg total) every 6 (six) hours as needed by mouth. 30 tablet 0  . albuterol (PROVENTIL HFA;VENTOLIN HFA) 108 (90 BASE) MCG/ACT inhaler Inhale 2 puffs into the lungs every 6 (six) hours as needed for shortness of breath.     [name] Kitchen aspirin EC 81 MG tablet Take 81 mg by mouth daily.     . cetirizine (ZYRTEC) 10 MG tablet Take 1 tablet (10 mg total) by mouth daily. 30 tablet 0  . docusate sodium (COLACE) 100 MG capsule Take 1 capsule (100 mg total) by mouth 2 (two) times daily. 10 capsule 0  . flintstones complete (FLINTSTONES) 60 MG chewable tablet Chew 1 tablet by mouth daily.    .  fluticasone (FLONASE) 50 MCG/ACT nasal spray Place 2 sprays into both nostrils daily. 16 g 0  . glipiZIDE (GLUCOTROL XL) 10 MG 24 hr tablet Take 1 tablet (10 mg total) by mouth daily with breakfast. 30 tablet 9  . Hydrocodone-Acetaminophen (VICODIN HP) 10-300 MG TABS Take 1 tablet by mouth 2 (two) times daily as needed (pain).    [name] Kitchen latanoprost (XALATAN) 0.005 % ophthalmic solution Place 1 drop into both eyes at bedtime.    [name] Kitchen lisinopril-hydrochlorothiazide (PRINZIDE,ZESTORETIC) 20-12.5 MG per tablet Take 1 tablet by mouth daily.      . metoprolol tartrate (LOPRESSOR) 25 MG tablet take 1 tablet by mouth twice a day 60 tablet 8  . montelukast (SINGULAIR) 10 MG tablet Take 10 mg by mouth daily.    . nitroGLYCERIN (NITROSTAT) 0.4 MG SL tablet Place 1 tablet (0.4 mg total) under the tongue every 5 (five) minutes as needed for chest pain. 25 tablet 3  . rosuvastatin (CRESTOR) 5 MG tablet Take 5 mg by mouth at bedtime.    . isosorbide mononitrate (IMDUR) 30 MG 24 hr tablet Take 1 tablet (30 mg total) by mouth daily. 30 tablet 6   No current facility-administered medications for this visit.     Allergies:   Latex    Social History:  The patient  reports that she quit smoking about 17 years ago. Her smoking use included cigarettes. She has a 81.00 pack-year smoking history. She has never used smokeless tobacco. She reports that she does not drink alcohol or use drugs.   Family History:  The patient's family history includes Asthma in her sister; Colon cancer in her father; Colon cancer (age of onset: 69) in her sister; Diabetes in her mother; Heart disease in her father, mother, and sister; Hypertension in her mother; Stomach cancer in her sister.    ROS:  Please see the history of present illness.   All other ROS neg  PHYSICAL EXAM: VS:  BP 122/68   Pulse 65   Ht 4\' 11"  (1.499 m)   Wt 211 lb (95.7 kg)   SpO2 93%   BMI 42.62 kg/m  , BMI Body mass index is 42.62 kg/m. GEN: Well nourished, well  developed, in no acute distress obese HEENT: normal  Neck: no JVD, carotid bruits, or masses Cardiac: RRR; no murmurs, rubs, or gallops,no edema  Respiratory:  clear to auscultation bilaterally, normal work of breathing GI: soft, nontender, nondistended, + BS MS: no deformity or atrophy  Skin: warm and dry, no rash Neuro:  Alert and Oriented x 3, Strength and sensation are intact Psych: euthymic mood, full affect    EKG:  01/15/17 sinus bradycardia rate 50 with nonspecific T-wave  flattening, no other abnormalities personally viewed.   Recent Labs: 03/07/2017: Magnesium 1.9 06/07/2017: ALT 17 08/16/2017: BUN 10; Creatinine, Ser 0.80; Hemoglobin 12.3; Platelets 194; Potassium 4.8; Sodium 138    Lipid Panel    Component Value Date/Time   CHOL  05/17/2009 0130    139        ATP III CLASSIFICATION:  <200     mg/dL   Desirable  200-239  mg/dL   Borderline High  >=240    mg/dL   High          TRIG 93 05/17/2009 0130   HDL 41 05/17/2009 0130   CHOLHDL 3.4 05/17/2009 0130   VLDL 19 05/17/2009 0130   LDLCALC  05/17/2009 0130    79        Total Cholesterol/HDL:CHD Risk Coronary Heart Disease Risk Table                     Men   Women  1/2 Average Risk   3.4   3.3  Average Risk       5.0   4.4  2 X Average Risk   9.6   7.1  3 X Average Risk  23.4   11.0        Use the calculated Patient Ratio above and the CHD Risk Table to determine the patient's CHD Risk.        ATP III CLASSIFICATION (LDL):  <100     mg/dL   Optimal  100-129  mg/dL   Near or Above                    Optimal  130-159  mg/dL   Borderline  160-189  mg/dL   High  >190     mg/dL   Very High      Wt Readings from Last 3 Encounters:  01/21/18 211 lb (95.7 kg)  08/16/17 215 lb (97.5 kg)  07/27/17 213 lb (96.6 kg)      Other studies Reviewed: Additional studies/ records that were reviewed today include: Prior medical records reviewed, lab work reviewed. Review of the above records demonstrates: as  above  Cath 12/20/15:  Prox RCA to Mid RCA lesion, 55% stenosed. Mid RCA to Dist RCA lesion, 100% stenosed. Mild disease of the Ostial-Prox SVG-dRCA(filling PLA & PDA).  Mid LAD lesion, 100% stenosed. LIMA-LAD is patent, but very small to a small distal LAD. Very tortuous graft - not a good option to attempt PCI through.  Prox Cx to Mid Cx lesion, 100% stenosed. The lesion was previously treated with a stent (unknown type) greater than two years ago. Mid Cx lesion, 100% stenosed. Patent SVG-OM2 & OM3.  Origin to Prox SVG-Diag Graft lesion, 100% stenosed. Known.  Sequential SVG-OM2-OM3 was injected is large & widely patent.  The left ventricular systolic function is normal.   No real change in anatomy from 2011 catheterization. No culprit lesion to explain the patient's stress test.  Normal EF with normal EDP and stable coronary arteries.  ASSESSMENT AND PLAN:  Coronary artery disease/angina -Cath 12/20/15 reassuring, no ischemia, normal EF, no change in anatomy -Aspirin 81.  Continue with metoprolol.  -  Prior visit to ER atypical chest pain. Reassurance.  -She is stating that she is having some increasing shortness of breath, mild chest pressure.  I would like for her to start isosorbide 30 mg once a day.  If she begins to have headache, reduce that to 15.  Continue with  metoprolol.  History of CABG bypass surgery 2007 -Cardiac catheterization as described above -Discussed her prior cardiac catheterization again.  Reassuring.  Hyperlipidemia -Currently on goal-directed therapy. Excellent. No myalgias.  - Occasional tingling in her right foot, likely neuropathy.  Continue with Crestor.  Her primary physician, Dr. [name] has been checking.  Morbid obesity -Encourage weight loss -Gastric bypass (was 525 lbs at one point), amazing weight loss. - Has some residual abdominal discomfort at scar site she thinks. No evidence of cholecystitis, normal gallbladder ultrasound  previously.  Of course, this is also relating to her shortness of breath.  Essential hypertension -No changes in medications. Well controlled.  Angina -metoprolol, antianginal. Stable coronary disease as described above.  I will try isosorbide as well  Former smoker -quit over 10 years ago.  Current medicines are reviewed at length with the patient today.  The patient does not have concerns regarding medicines.  The following changes have been made:  no change  Labs/ tests ordered today include:  No orders of the defined types were placed in this encounter.  Disposition:    6 months follow up Cecille Rubin, one year me    Signed, Candee Furbish, MD  01/21/2018 11:47 AM    [facility] [address], [place], [place]  [address] Phone: [phone]; Fax: [phone]

## 2018-01-21 NOTE — Patient Instructions (Signed)
Medication Instructions:  Please start Isosorbide 30 mg a day. Continue all other medications as listed.  Follow-Up: Follow up in 6 months with Truitt Merle, NP.  You will receive a letter in the mail 2 months before you are due.  Please call us when you receive this letter to schedule your follow up appointment.  Follow up in 1 year with Dr. Marlou Porch.  You will receive a letter in the mail 2 months before you are due.  Please call us when you receive this letter to schedule your follow up appointment.  If you need a refill on your cardiac medications before your next appointment, please call your pharmacy.  Thank you for choosing [facility]!!

## 2018-04-18 ENCOUNTER — Other Ambulatory Visit: Payer: Self-pay | Admitting: Cardiology

## 2018-04-18 DIAGNOSIS — R0789 Other chest pain: Secondary | ICD-10-CM

## 2018-05-09 IMAGING — CR DG HAND COMPLETE 3+V*L*
3 series · 3 of 3 positions shown · non-contrast
Comparison: 03/02/2016

CLINICAL DATA: Little finger and thumb pain.  No injury.

EXAM:
LEFT HAND - COMPLETE 3+ VIEW

[hand pa]
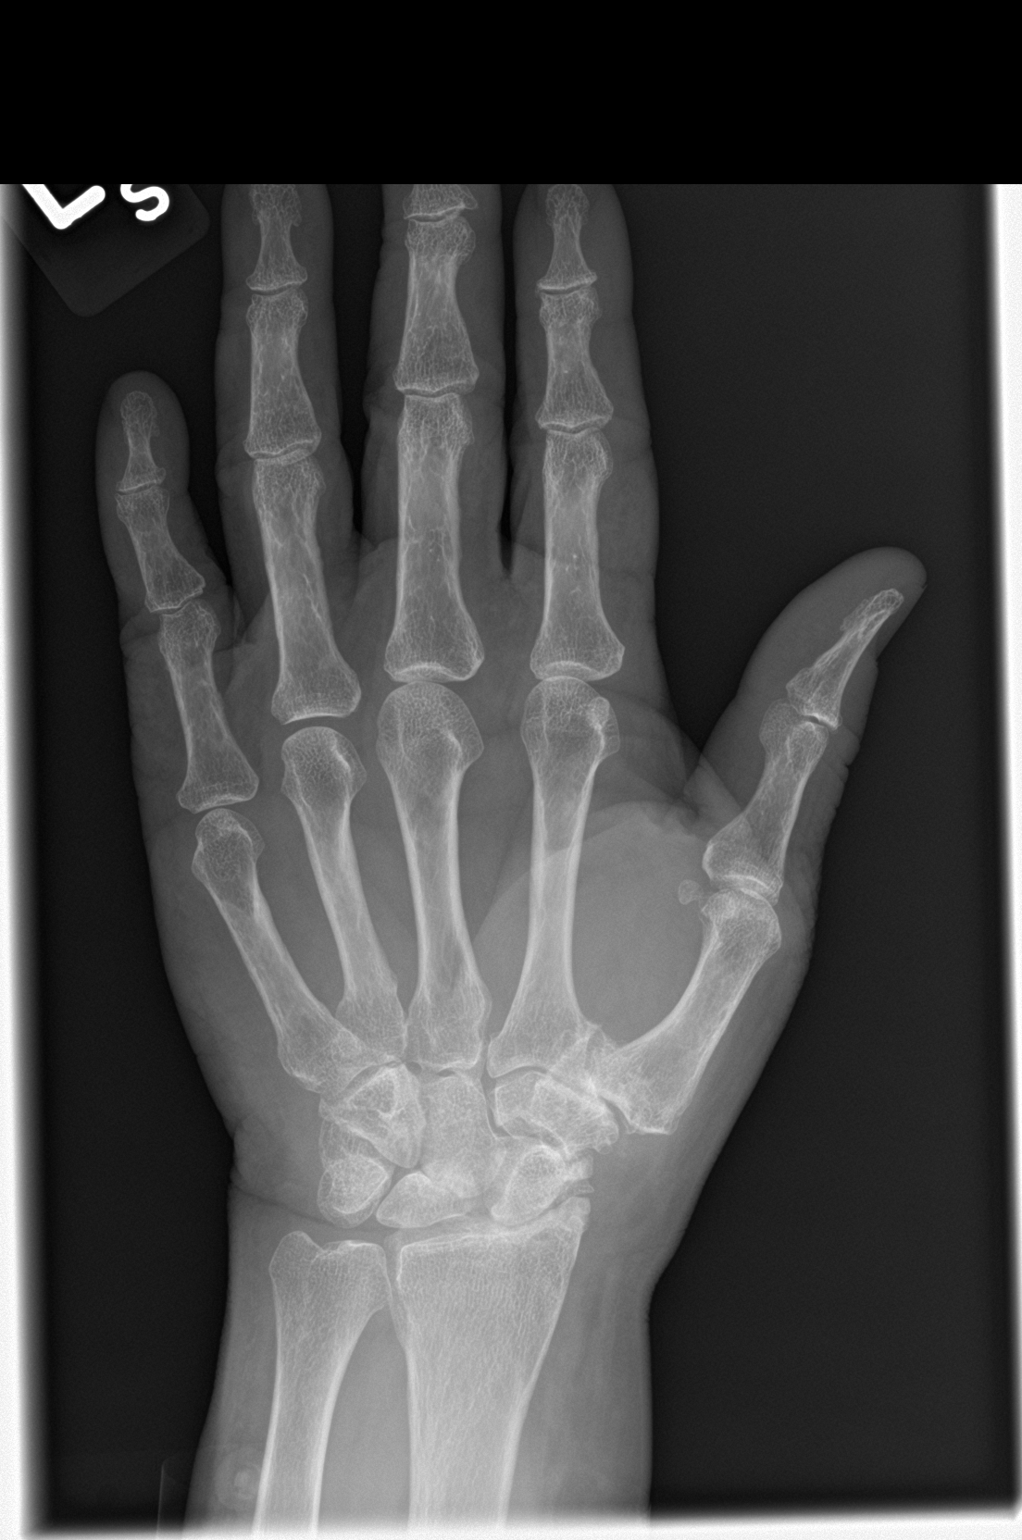

[hand obl]
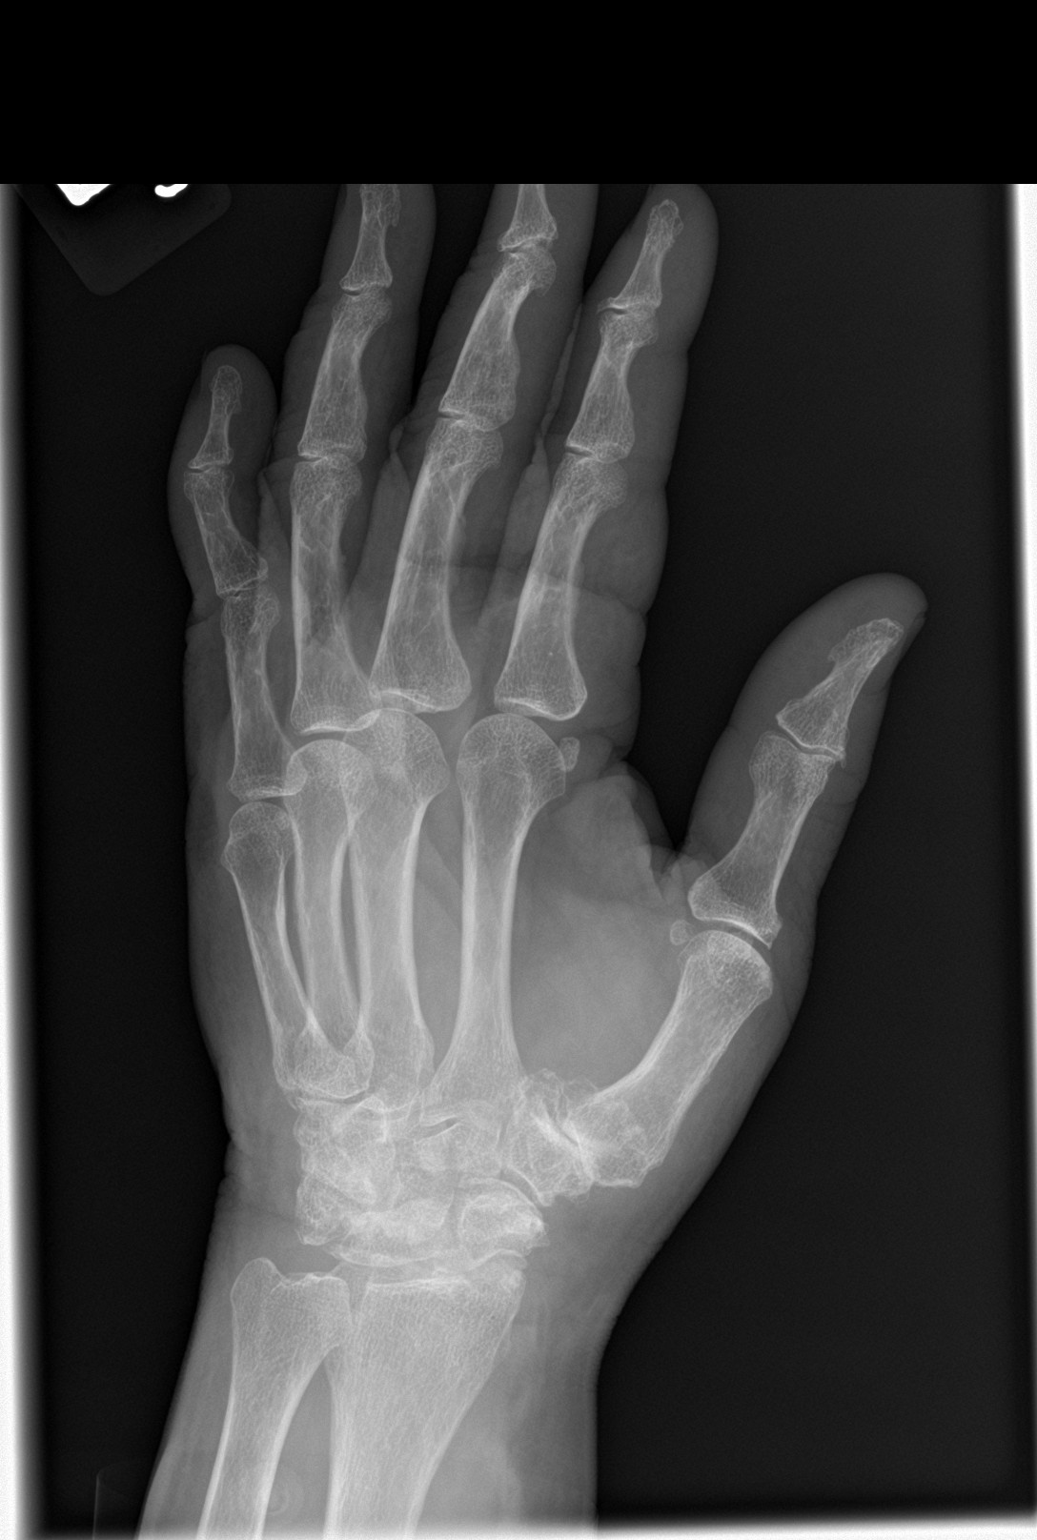

[hand lat]
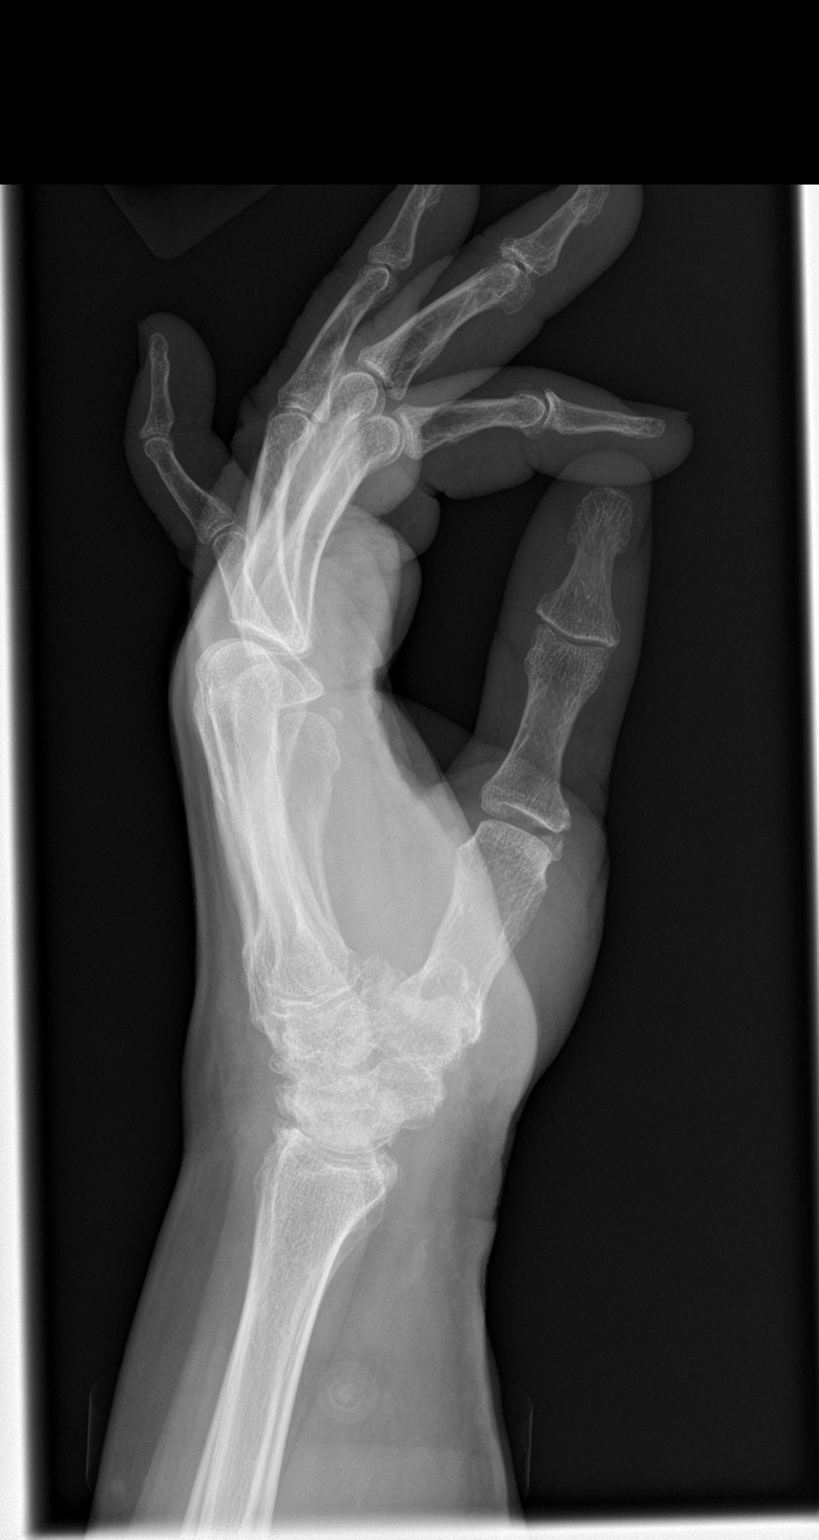

[3 of 3 positions shown; findings below may reference images not displayed]

FINDINGS: Degenerative changes noted in the DIP joints and first
carpometacarpal joint. Degenerative changes also in the radiocarpal
joint. Joint space loss and spurring in these affected joints. No
acute bony abnormality. Specifically, no fracture, subluxation, or
dislocation. Soft tissues are intact.
IMPRESSION: No acute bony abnormality.

## 2018-06-18 ENCOUNTER — Other Ambulatory Visit: Payer: Self-pay | Admitting: Cardiology

## 2018-07-05 ENCOUNTER — Encounter: Payer: Self-pay | Admitting: Nurse Practitioner

## 2018-07-13 ENCOUNTER — Encounter (HOSPITAL_COMMUNITY): Payer: Self-pay

## 2018-07-13 ENCOUNTER — Other Ambulatory Visit: Payer: Self-pay

## 2018-07-13 ENCOUNTER — Emergency Department (HOSPITAL_COMMUNITY): Payer: Medicare Other

## 2018-07-13 ENCOUNTER — Emergency Department (HOSPITAL_COMMUNITY)
Admission: EM | Admit: 2018-07-13 | Discharge: 2018-07-13 | Disposition: A | Discharge status: Home / Self Care | Payer: Medicare Other | Attending: Emergency Medicine | Admitting: Emergency Medicine

## 2018-07-13 DIAGNOSIS — J45909 Unspecified asthma, uncomplicated: Secondary | ICD-10-CM | POA: Insufficient documentation

## 2018-07-13 DIAGNOSIS — I509 Heart failure, unspecified: Secondary | ICD-10-CM | POA: Insufficient documentation

## 2018-07-13 DIAGNOSIS — M25512 Pain in left shoulder: Secondary | ICD-10-CM | POA: Diagnosis present

## 2018-07-13 DIAGNOSIS — I10 Essential (primary) hypertension: Secondary | ICD-10-CM | POA: Insufficient documentation

## 2018-07-13 DIAGNOSIS — I2510 Atherosclerotic heart disease of native coronary artery without angina pectoris: Secondary | ICD-10-CM | POA: Insufficient documentation

## 2018-07-13 DIAGNOSIS — E785 Hyperlipidemia, unspecified: Secondary | ICD-10-CM | POA: Insufficient documentation

## 2018-07-13 DIAGNOSIS — E119 Type 2 diabetes mellitus without complications: Secondary | ICD-10-CM | POA: Insufficient documentation

## 2018-07-13 DIAGNOSIS — Z79899 Other long term (current) drug therapy: Secondary | ICD-10-CM | POA: Diagnosis not present

## 2018-07-13 LAB — CBC
HCT: 41.7 % (ref 36.0–46.0)
Hemoglobin: 12.9 g/dL (ref 12.0–15.0)
MCH: 27.6 pg (ref 26.0–34.0)
MCHC: 30.9 g/dL (ref 30.0–36.0)
MCV: 89.3 fL (ref 80.0–100.0)
PLATELETS: 171 10*3/uL (ref 150–400)
RBC: 4.67 MIL/uL (ref 3.87–5.11)
RDW: 14.4 % (ref 11.5–15.5)
WBC: 6 10*3/uL (ref 4.0–10.5)
nRBC: 0 % (ref 0.0–0.2)

## 2018-07-13 LAB — BASIC METABOLIC PANEL
Anion gap: 9 (ref 5–15)
BUN: 8 mg/dL (ref 8–23)
CALCIUM: 9.6 mg/dL (ref 8.9–10.3)
CHLORIDE: 102 mmol/L (ref 98–111)
CO2: 26 mmol/L (ref 22–32)
CREATININE: 0.76 mg/dL (ref 0.44–1.00)
GFR calc Af Amer: >60 mL/min (ref >60)
GFR calc non Af Amer: >60 mL/min (ref >60)
GLUCOSE: 128 mg/dL — AB (ref 70–99)
POTASSIUM: 4 mmol/L (ref 3.5–5.1)
SODIUM: 137 mmol/L (ref 135–145)

## 2018-07-13 LAB — I-STAT TROPONIN, ED: Troponin i, poc: 0 ng/mL (ref 0.00–0.08)

## 2018-07-13 MED ORDER — NAPROXEN 250 MG PO TABS
375.0000 mg | ORAL_TABLET | Freq: Once | ORAL | Status: AC
  Administered 2018-07-13: 375 mg via ORAL
  Filled 2018-07-13: qty 2

## 2018-07-13 MED ORDER — NAPROXEN 375 MG PO TABS: 375.0000 mg | ORAL_TABLET | Freq: Two times a day (BID) | ORAL | 0 refills | Status: DC

## 2018-07-13 NOTE — ED Triage Notes (Signed)
Pt reports that's for that past 3 days she has been having L arm pain, radiating to her L neck and back. Denies CP, SOB or nausea, hx of open heart surgery. Denies injury to arm

## 2018-07-13 NOTE — ED Provider Notes (Signed)
[facility] EMERGENCY DEPARTMENT Provider Note   CSN: [id number] Arrival date & time: 07/13/18  6503     History   Chief Complaint Chief Complaint  Patient presents with  . Arm Pain    HPI Patricia Cuevas is a 67 y.o. female.  The history is provided by the patient.  She has history of coronary artery disease, heart failure, diabetes, hypertension, hyperlipidemia, arthritis and comes in complaining of pain in her left shoulder and left arm which started 2 days ago.  Pain is constant and worse with any kind of movement.  She also feels some numbness in her left arm.  She denies any trauma or unusual activity.  She has been taking hydrocodone-acetaminophen which gives her temporary relief of pain.  She denies chest pain and denies dyspnea or nausea or diaphoresis.  She rates her pain a 10/10.  Past Medical History:  Diagnosis Date  . Alcohol abuse    in the past, resolved  . Arthritis   . Asthma   . Blood transfusion without reported diagnosis 1984   after surgery  . Breast cancer [facility])    Double mastectomy 2007; s/p tamoxifen and arimidex therapy; no recurrence since 05/2008  . CAD (coronary artery disease)    Nuclear stress test Feb 11, 2011, EF 64%, no scar or ischemia    //         catheterization, November, 2011,patent LIMA-LAD-small caliber distal vessel with diffuse plaque, patent SVG to OM1 and OM 2, patent SVG to PDA and PLA, occluded SVG to diagonal, medical therapy;  Lexiscan Myoview 6/14:  Inferior thinning, no ischemia, EF 61%, low risk  . CHF (congestive heart failure) ([place])   . Chronic back pain   . Diabetes mellitus   . Dyslipidemia   . Ejection fraction    EF 65%, echo, [place], Jan 16, 2011  . Hx of CABG    2007  . Hx of colonic polyps   . Hyperlipidemia   . Hypertension   . Myocardial infarction ([place])   . Nausea & vomiting    hospitalization, November, 2011, stricture GE junction, questionable stenosis gastrojejunostomy, disruptive  primary peristaltic wave, GE reflux, delayed emptying proximal gastric pouch  . Normal cardiac stress test 02/2013  . Overweight(278.02)    stomach stapling 1985 followed by weight loss, then return of weight  . Sciatica   . Sleep apnea    CPAP being arranged May, 2011//does not use CPAP  . Tobacco abuse    in the past, resolved  . UTI (urinary tract infection)     Patient Active Problem List   Diagnosis Date Noted  . Asthma 03/07/2017  . Sleep apnea 03/07/2017  . Diabetes mellitus type 2, controlled ([place]) 03/07/2017  . Hyperlipidemia 03/07/2017  . CAD (coronary artery disease) with prior CABG 03/07/2017  . UTI (urinary tract infection) 03/07/2017  . Sepsis ([place]) 03/07/2017  . Sepsis due to urinary tract infection ([place]) 03/07/2017  . HTN (hypertension) 03/07/2017  . Constipation by delayed colonic transit 03/07/2017  . Abnormal nuclear stress test 12/20/2015  . Angina, class III ([place]) 12/20/2015  . Chest pain 11/12/2014  . Upper GI bleed 03/04/2013  . Atypical chest pain 01/07/2013  . History of tobacco abuse 01/07/2013  . SIRS (systemic inflammatory response syndrome) ([place]) 07/28/2012  . Tachycardia 07/28/2012  . GERD (gastroesophageal reflux disease) 02/24/2011  . Coronary artery disease involving native coronary artery with angina pectoris ([place])   . Hx of CABG   .  Overweight   . OSA (obstructive sleep apnea)   . Nausea & vomiting   . Breast cancer ([place])   . Hypertension   . Diabetes mellitus ([place])   . Dyslipidemia   . Ejection fraction     Past Surgical History:  Procedure Laterality Date  . ABDOMINAL HYSTERECTOMY  1989  . CARDIAC CATHETERIZATION N/A 12/20/2015   Procedure: Left Heart Cath and Cors/Grafts Angiography;  Surgeon: Leonie Man, MD;  Location: [facility];  Service: Cardiovascular;  Laterality: N/A;  . CORONARY ARTERY BYPASS GRAFT  2009  . CORONARY STENT PLACEMENT    . GASTRIC BYPASS  1983   open.  [place]  . MASTECTOMY Bilateral 1987      OB History   None      Home Medications    Prior to Admission medications   Medication Sig Start Date End Date Taking? Authorizing Provider  acetaminophen (TYLENOL) 500 MG tablet Take 1 tablet (500 mg total) every 6 (six) hours as needed by mouth. 07/27/17  Yes Kirichenko, Tatyana, PA-C  albuterol (PROVENTIL HFA;VENTOLIN HFA) 108 (90 BASE) MCG/ACT inhaler Inhale 2 puffs into the lungs every 6 (six) hours as needed for shortness of breath.    Yes [provider]  aspirin EC 81 MG tablet Take 81 mg by mouth daily.    Yes [provider]  Cholecalciferol (VITAMIN D-3 PO) Take 1 capsule by mouth daily.   Yes [provider]  glipiZIDE (GLUCOTROL XL) 10 MG 24 hr tablet Take 1 tablet (10 mg total) by mouth daily with breakfast. 12/17/15  Yes [name], NP  HYDROcodone-acetaminophen (NORCO) 10-325 MG tablet Take 1 tablet by mouth 2 (two) times daily as needed for moderate pain.   Yes [provider]  latanoprost (XALATAN) 0.005 % ophthalmic solution Place 1 drop into both eyes at bedtime.   Yes [provider]  lisinopril-hydrochlorothiazide (PRINZIDE,ZESTORETIC) 20-12.5 MG per tablet Take 1 tablet by mouth daily.     Yes [provider]  metoprolol tartrate (LOPRESSOR) 25 MG tablet TAKE 1 TABLET BY MOUTH TWICE A DAY Patient taking differently: Take 25 mg by mouth 2 (two) times daily.  04/18/18  Yes [name], MD  nitroGLYCERIN (NITROSTAT) 0.4 MG SL tablet Place 1 tablet (0.4 mg total) under the tongue every 5 (five) minutes as needed for chest pain. 01/15/17  Yes [name], MD  rosuvastatin (CRESTOR) 5 MG tablet Take 5 mg by mouth at bedtime.   Yes [provider]  cetirizine (ZYRTEC) 10 MG tablet Take 1 tablet (10 mg total) by mouth daily. 08/16/17   Maczis, Barth Kirks, PA-C  docusate sodium (COLACE) 100 MG capsule Take 1 capsule (100 mg total) by mouth 2 (two) times daily. Patient not taking: Reported on 07/13/2018  03/09/17   Hosie Poisson, MD  fluticasone (FLONASE) 50 MCG/ACT nasal spray Place 2 sprays into both nostrils daily. 08/16/17   Maczis, Barth Kirks, PA-C  isosorbide mononitrate (IMDUR) 30 MG 24 hr tablet TAKE 1 TABLET(30 MG) BY MOUTH DAILY Patient not taking: Reported on 07/13/2018 06/21/18   [name], MD    Family History Family History  Problem Relation Age of Onset  . Hypertension Mother   . Heart disease Mother   . Diabetes Mother   . Heart disease Father   . Colon cancer Father        does not know age of onset  . Heart disease Sister   . Colon cancer Sister  44  . Stomach cancer Sister   . Asthma Sister   . Esophageal cancer Neg Hx   . Rectal cancer Neg Hx     Social History Social History   Tobacco Use  . Smoking status: Former Smoker    Packs/day: 3.00    Years: 27.00    Pack years: 81.00    Types: Cigarettes    Last attempt to quit: 09/14/2000    Years since quitting: 17.8  . Smokeless tobacco: Never Used  Substance Use Topics  . Alcohol use: No  . Drug use: No     Allergies   Latex   Review of Systems Review of Systems  All other systems reviewed and are negative.    Physical Exam Updated Vital Signs BP 138/61   Pulse 63   Temp 98.6 F (37 C) (Oral)   Resp 20   SpO2 98%   Physical Exam  Nursing note and vitals reviewed.  67 year old female, resting comfortably and in no acute distress. Vital signs are normal. Oxygen saturation is 98%, which is normal. Head is normocephalic and atraumatic. PERRLA, EOMI. Oropharynx is clear. Neck is nontender in the midline, but there is some tenderness of the left paracervical muscles.  There is no adenopathy or JVD. Back is nontender and there is no CVA tenderness. Lungs are clear without rales, wheezes, or rhonchi. Chest is nontender. Heart has regular rate and rhythm without murmur. Abdomen is soft, flat, nontender without masses or hepatosplenomegaly and peristalsis is normoactive. Extremities: There  is tenderness rather diffusely throughout the left shoulder.  There is pain with virtually any passive range of motion of the left shoulder.  There is full range of motion of the left elbow and wrist, although patient will resist movement.  This appears to be more out of concern that will be painful rather than actually eliciting pain.  Neurovascular exam is intact with strong pulses, prompt capillary refill, normal sensation.  Full range of motion present all other joints without pain.  There is no edema.. Skin is warm and dry without rash. Neurologic: Mental status is normal, cranial nerves are intact, there are no motor or sensory deficits.  ED Treatments / Results  Labs (all labs ordered are listed, but only abnormal results are displayed) Labs Reviewed  BASIC METABOLIC PANEL - Abnormal; Notable for the following components:      Result Value   Glucose, Bld 128 (*)    All other components within normal limits  CBC  I-STAT TROPONIN, ED    EKG EKG Interpretation  Date/Time:  Wednesday July 13 2018 03:22:09 EDT Ventricular Rate:  68 PR Interval:  148 QRS Duration: 78 QT Interval:  412 QTC Calculation: 438 R Axis:   -4 Text Interpretation:  Normal sinus rhythm with sinus arrhythmia T wave abnormality, consider anterior ischemia Abnormal ECG When compared with ECG of 06/07/2017, No significant change was found Confirmed by [name] ([id number]) on 07/13/2018 3:38:43 AM   Radiology Dg Chest 2 View  Result Date: 07/13/2018 CLINICAL DATA:  67 year old female with chest pain EXAM: CHEST - 2 VIEW COMPARISON:  Chest radiograph dated 08/16/2017 FINDINGS: Stable eventration of the right hemidiaphragm. The lungs are clear. There is no pleural effusion or pneumothorax. The cardiac silhouette is within normal limits. Median sternotomy wires and CABG vascular clips. Right axillary surgical clips. No acute osseous pathology. IMPRESSION: No active cardiopulmonary disease. Electronically Signed    By: Anner Crete M.D.   On: 07/13/2018 04:16  Procedures Procedures   Medications Ordered in ED Medications  naproxen (NAPROSYN) tablet 375 mg (has no administration in time range)     Initial Impression / Assessment and Plan / ED Course  I have reviewed the triage vital signs and the nursing notes.  Pertinent labs & imaging results that were available during my care of the patient were reviewed by me and considered in my medical decision making (see chart for details).  Left shoulder pain.  On exam, I am not certain whether this is radicular or related to arthritis in the left shoulder possible rotator cuff injury.  No red flags to suggest more serious pathology.  Chest x-ray is read as negative but does show significant arthritic changes in both shoulders.  Glucose is 465, metabolic panel otherwise unremarkable.  Troponin is 0 and does not need to be repeated in patient who has had constant pain for 2days.  WBC is normal as is hemoglobin.  I have explained to patient that pathology may be in her neck or her shoulder.  She is advised to apply ice and she is given a prescription for naproxen to take twice a day.  Continue to use hydrocodone-acetaminophen as needed for severe pain.  Follow-up with PCP in 5 days for recheck.  Return precautions discussed.  Final Clinical Impressions(s) / ED Diagnoses   Final diagnoses:  None    ED Discharge Orders    None       [name], MD 68[birth year]75 [phone]

## 2018-07-13 NOTE — Discharge Instructions (Addendum)
Apply ice 3-4 times a day.   Return if symptoms are getting worse.

## 2018-07-16 ENCOUNTER — Other Ambulatory Visit: Payer: Self-pay | Admitting: Cardiology

## 2018-07-18 NOTE — Telephone Encounter (Signed)
Outpatient Medication Detail    Disp Refills Start End   isosorbide mononitrate (IMDUR) 30 MG 24 hr tablet 90 tablet 2 06/21/2018    Sig: TAKE 1 TABLET(30 MG) BY MOUTH DAILY   Patient not taking: Reported on 07/13/2018       Sent to pharmacy as: isosorbide mononitrate (IMDUR) 30 MG 24 hr tablet   Notes to Pharmacy: **Patient requests 90 days supply**   E-Prescribing Status: Receipt confirmed by pharmacy (06/21/2018 3:50 PM EDT)   Pharmacy   [facility] [place], [place]

## 2018-07-25 ENCOUNTER — Ambulatory Visit: Payer: Medicare Other | Admitting: Nurse Practitioner

## 2018-07-25 NOTE — Progress Notes (Deleted)
CARDIOLOGY OFFICE NOTE  Date:  07/25/2018    Patricia Cuevas Date of Birth: 1950-10-24 Medical Record #[id number]  PCP:  Patricia Axe, MD  Cardiologist:  Patricia Cuevas    No chief complaint on file.   History of Present Illness: Patricia Cuevas is a 67 y.o. female who presents today for a 4 month check. Former patient of Dr. Kae Cuevas - now seeing Dr. Marlou Cuevas.   She has known CAD with prior CABG in 2007 with catheterization in 2011 and 2017 demonstrating patent LIMA to LAD, small caliber vessel distally diffuse plaque with patent SVG to OM1 and OM 2, patent SVG to PDA and PLA, occluded SVG to diagonal. Last cath in 2017 was secondary to abnormal NUC stress. Record notes that she has tended to seek the majority of her care in the emergency department.  Other issues include HTN, HLD, breast cancer, asthma, DM, chronic back pain, multi substance abuse and OSA.   Last seen in May by Dr. Marlou Cuevas - having some DOE and mild chest pressure for the past few months - sometimes at rest. Dr. Marlou Cuevas felt like she was stable given the cath data from 2017. Imdur was added to her regimen.   Comes in today. Here with   Past Medical History:  Diagnosis Date  . Alcohol abuse    in the past, resolved  . Arthritis   . Asthma   . Blood transfusion without reported diagnosis 1984   after surgery  . Breast cancer [facility])    Double mastectomy 2007; s/p tamoxifen and arimidex therapy; no recurrence since 05/2008  . CAD (coronary artery disease)    Nuclear stress test Feb 11, 2011, EF 64%, no scar or ischemia    //         catheterization, November, 2011,patent LIMA-LAD-small caliber distal vessel with diffuse plaque, patent SVG to OM1 and OM 2, patent SVG to PDA and PLA, occluded SVG to diagonal, medical therapy;  Lexiscan Myoview 6/14:  Inferior thinning, no ischemia, EF 61%, low risk  . CHF (congestive heart failure) ([place])   . Chronic back pain   . Diabetes mellitus   . Dyslipidemia   .  Ejection fraction    EF 65%, echo, [place], Jan 16, 2011  . Hx of CABG    2007  . Hx of colonic polyps   . Hyperlipidemia   . Hypertension   . Myocardial infarction ([place])   . Nausea & vomiting    hospitalization, November, 2011, stricture GE junction, questionable stenosis gastrojejunostomy, disruptive primary peristaltic wave, GE reflux, delayed emptying proximal gastric pouch  . Normal cardiac stress test 02/2013  . Overweight(278.02)    stomach stapling 1985 followed by weight loss, then return of weight  . Sciatica   . Sleep apnea    CPAP being arranged May, 2011//does not use CPAP  . Tobacco abuse    in the past, resolved  . UTI (urinary tract infection)     Past Surgical History:  Procedure Laterality Date  . ABDOMINAL HYSTERECTOMY  1989  . CARDIAC CATHETERIZATION N/A 12/20/2015   Procedure: Left Heart Cath and Cors/Grafts Angiography;  Surgeon: Leonie Man, MD;  Location: [facility];  Service: Cardiovascular;  Laterality: N/A;  . CORONARY ARTERY BYPASS GRAFT  2009  . CORONARY STENT PLACEMENT    . GASTRIC BYPASS  1983   open.  [place]  . MASTECTOMY Bilateral 1987     Medications: No outpatient medications have  been marked as taking for the 07/25/18 encounter (Appointment) with [name], NP.     Allergies: Allergies  Allergen Reactions  . Latex Itching and Rash    Social History: The patient  reports that she quit smoking about 17 years ago. Her smoking use included cigarettes. She has a 81.00 pack-year smoking history. She has never used smokeless tobacco. She reports that she does not drink alcohol or use drugs.   Family History: The patient's family history includes Asthma in her sister; Colon cancer in her father; Colon cancer (age of onset: 6) in her sister; Diabetes in her mother; Heart disease in her father, mother, and sister; Hypertension in her mother; Stomach cancer in her sister.   Review of Systems: Please see the history of  present illness.   Otherwise, the review of systems is positive for none.   All other systems are reviewed and negative.   Physical Exam: VS:  There were no vitals taken for this visit. [name] Kitchen  BMI There is no height or weight on file to calculate BMI.  Wt Readings from Last 3 Encounters:  01/21/18 211 lb (95.7 kg)  08/16/17 215 lb (97.5 kg)  07/27/17 213 lb (96.6 kg)    General: Pleasant. Well developed, well nourished and in no acute distress.   HEENT: Normal.  Neck: Supple, no JVD, carotid bruits, or masses noted.  Cardiac: Regular rate and rhythm. No murmurs, rubs, or gallops. No edema.  Respiratory:  Lungs are clear to auscultation bilaterally with normal work of breathing.  GI: Soft and nontender.  MS: No deformity or atrophy. Gait and ROM intact.  Skin: Warm and dry. Color is normal.  Neuro:  Strength and sensation are intact and no gross focal deficits noted.  Psych: Alert, appropriate and with normal affect.   LABORATORY DATA:  EKG:  EKG is ordered today. This demonstrates .  Lab Results  Component Value Date   WBC 6.0 07/13/2018   HGB 12.9 07/13/2018   HCT 41.7 07/13/2018   PLT 171 07/13/2018   GLUCOSE 128 (H) 07/13/2018   CHOL  05/17/2009    139        ATP III CLASSIFICATION:  <200     mg/dL   Desirable  200-239  mg/dL   Borderline High  >=240    mg/dL   High          TRIG 93 05/17/2009   HDL 41 05/17/2009   LDLCALC  05/17/2009    79        Total Cholesterol/HDL:CHD Risk Coronary Heart Disease Risk Table                     Men   Women  1/2 Average Risk   3.4   3.3  Average Risk       5.0   4.4  2 X Average Risk   9.6   7.1  3 X Average Risk  23.4   11.0        Use the calculated Patient Ratio above and the CHD Risk Table to determine the patient's CHD Risk.        ATP III CLASSIFICATION (LDL):  <100     mg/dL   Optimal  100-129  mg/dL   Near or Above                    Optimal  130-159  mg/dL   Borderline  160-189  mg/dL   High  >  190     mg/dL    Very High   ALT 17 06/07/2017   AST 24 06/07/2017   NA 137 07/13/2018   K 4.0 07/13/2018   CL 102 07/13/2018   CREATININE 0.76 07/13/2018   BUN 8 07/13/2018   CO2 26 07/13/2018   INR 1.12 03/07/2017   HGBA1C 7.0 (H) 03/07/2017     BNP (last 3 results) No results for input(s): BNP in the last 8760 hours.  ProBNP (last 3 results) No results for input(s): PROBNP in the last 8760 hours.   Other Studies Reviewed Today:  Cath 12/20/15:  Prox RCA to Mid RCA lesion, 55% stenosed. Mid RCA to Dist RCA lesion, 100% stenosed. Mild disease of the Ostial-Prox SVG-dRCA(filling PLA & PDA).  Mid LAD lesion, 100% stenosed. LIMA-LAD is patent, but very small to a small distal LAD. Very tortuous graft - not a good option to attempt PCI through.  Prox Cx to Mid Cx lesion, 100% stenosed. The lesion was previously treated with a stent (unknown type) greater than two years ago. Mid Cx lesion, 100% stenosed. Patent SVG-OM2 & OM3.  Origin to Prox SVG-Diag Graft lesion, 100% stenosed. Known.  Sequential SVG-OM2-OM3 was injected is large & widely patent.  The left ventricular systolic function is normal.   No real change in anatomy from 2011 catheterization. No culprit lesion to explain the patient's stress test.  Normal EF with normal EDP and stable coronary arteries.  Assessment/Plan:  Coronary artery disease/angina -Cath 12/20/15 reassuring, no ischemia, normal EF, no change in anatomy -Aspirin 81.  Continue with metoprolol.  -  Prior visit to ER atypical chest pain. Reassurance.  -She is stating that she is having some increasing shortness of breath, mild chest pressure.  I would like for her to start isosorbide 30 mg once a day.  If she begins to have headache, reduce that to 15.  Continue with metoprolol.  History of CABG bypass surgery 2007 -Cardiac catheterization as described above -Discussed her prior cardiac catheterization again.  Reassuring.  Hyperlipidemia -Currently on  goal-directed therapy. Excellent. No myalgias.  - Occasional tingling in her right foot, likely neuropathy.  Continue with Crestor.  Her primary physician, Dr. Glendon Cuevas has been checking.  Morbid obesity -Encourage weight loss -Gastric bypass (was 525 lbs at one point), amazing weight loss. - Has some residual abdominal discomfort at scar site she thinks. No evidence of cholecystitis, normal gallbladder ultrasound previously.  Of course, this is also relating to her shortness of breath.  Essential hypertension -No changes in medications. Well controlled.  Angina -metoprolol, antianginal. Stable coronary disease as described above.  I will try isosorbide as well  Former smoker -quit over 10 years ago.  Current medicines are reviewed with the patient today.  The patient does not have concerns regarding medicines other than what has been noted above.  The following changes have been made:  See above.  Labs/ tests ordered today include:   No orders of the defined types were placed in this encounter.    Disposition:   FU with *** in {gen number [id number]:701779} {Days to years:10300}.   Patient is agreeable to this plan and will call if any problems develop in the interim.   SignedTruitt Merle, NP  07/25/2018 8:22 AM  [facility] [address] [place], [place]  [address] Phone: [phone] Fax: [phone]

## 2018-08-02 ENCOUNTER — Encounter: Payer: Self-pay | Admitting: Nurse Practitioner

## 2018-08-23 ENCOUNTER — Ambulatory Visit (INDEPENDENT_AMBULATORY_CARE_PROVIDER_SITE_OTHER): Payer: Medicare Other | Admitting: Cardiology

## 2018-08-23 ENCOUNTER — Encounter: Payer: Self-pay | Admitting: Cardiology

## 2018-08-23 VITALS — BP 120/60 | HR 68 | Ht 59.0 in | Wt 212.4 lb

## 2018-08-23 DIAGNOSIS — E785 Hyperlipidemia, unspecified: Secondary | ICD-10-CM | POA: Diagnosis not present

## 2018-08-23 DIAGNOSIS — I2510 Atherosclerotic heart disease of native coronary artery without angina pectoris: Secondary | ICD-10-CM | POA: Diagnosis not present

## 2018-08-23 DIAGNOSIS — Z79899 Other long term (current) drug therapy: Secondary | ICD-10-CM | POA: Diagnosis not present

## 2018-08-23 DIAGNOSIS — I209 Angina pectoris, unspecified: Secondary | ICD-10-CM | POA: Diagnosis not present

## 2018-08-23 DIAGNOSIS — I2583 Coronary atherosclerosis due to lipid rich plaque: Secondary | ICD-10-CM

## 2018-08-23 LAB — LIPID PANEL
CHOL/HDL RATIO: 3.1 ratio (ref 0.0–4.4)
Cholesterol, Total: 149 mg/dL (ref 100–199)
HDL: 48 mg/dL (ref >39)
LDL Calculated: 81 mg/dL (ref 0–99)
TRIGLYCERIDES: 102 mg/dL (ref 0–149)
VLDL CHOLESTEROL CAL: 20 mg/dL (ref 5–40)

## 2018-08-23 LAB — ALT: ALT: 12 IU/L (ref 0–32)

## 2018-08-23 NOTE — Patient Instructions (Signed)
Medication Instructions:  Please discontinue your Isosorbide. Continue all other medications as listed.  If you need a refill on your cardiac medications before your next appointment, please call your pharmacy.   Lab work: Please have blood work today ([place]) If you have labs (blood work) drawn today and your tests are completely normal, you will receive your results only by: [name] Kitchen MyChart Message (if you have MyChart) OR . A paper copy in the mail If you have any lab test that is abnormal or we need to change your treatment, we will call you to review the results.  Follow-Up: At [facility], you and your health needs are our priority.  As part of our continuing mission to provide you with exceptional heart care, we have created designated Provider Care Teams.  These Care Teams include your primary Cardiologist (physician) and Advanced Practice Providers (APPs -  Physician Assistants and Nurse Practitioners) who all work together to provide you with the care you need, when you need it. You will need a follow up appointment in 12 months.  Please call our office 2 months in advance to schedule this appointment.  You may see Dr Candee Furbish. or one of the following Advanced Practice Providers on your designated Care Team:   Truitt Merle, NP Cecilie Kicks, NP . Kathyrn Drown, NP  Thank you for choosing [facility]!!

## 2018-08-23 NOTE — Progress Notes (Signed)
Cardiology Office Note   Date:  08/23/2018   ID:  Patricia Cuevas, DOB 12-09-1950, MRN [id number]  PCP:  [name], MD  Cardiologist:   Candee Furbish, MD     History of Present Illness: Patricia Cuevas is a 67 y.o. female former patient of Dr. Ron Parker here for follow-up appointment.  Has coronary disease post bypass surgery in 2007 with catheterization in 2011 and 2017 demonstrating patent LIMA to LAD, small caliber vessel distally diffuse plaque with patent SVG to OM1 and OM 2, patent SVG to PDA and PLA, occluded SVG to diagonal. Last cath in 2017 secondary to abnormal NUC stress.   She seeks the majority of her care in the emergency department.  Went to ER 01/29/2016 - abdominal pain, ruq [place] normal.   Multiple episodes of atypical chest pain. No CP, no SOB, no edema.   Compliant with meds.  Normal EF.  01/15/17-she was in the emergency room on 11/16/16 and 10/06/16 with nonspecific chest pain and right shoulder pain. Reassuring workup. Last catheterization 12/20/15. See below. Fell and problems with right shoulder, pain in neck as well.  01/21/18 -having some more SOB with walking, mild pressure. A few months. Sometimes at rest.  Reviewed her prior cardiac catheterization that was reassuring.  2017.  08/23/2018-chief complaint coronary artery disease follow-up.  Still has some shortness of breath.  Once again reviewed that her prior heart cath in 2017 was reassuring.  Medical therapy.  Overall though she is feeling quite well.  She is not having any chest discomfort.  We will test the waters with her being off of the isosorbide.  Once again, her maximum weight was 525 pounds, she is post gastric bypass.  We will check cholesterol.  Taking all of her medicine she states.  Denies any fevers chills nausea vomiting syncope bleeding.  Past Medical History:  Diagnosis Date  . Alcohol abuse    in the past, resolved  . Arthritis   . Asthma   . Blood transfusion without reported diagnosis  1984   after surgery  . Breast cancer [facility])    Double mastectomy 2007; s/p tamoxifen and arimidex therapy; no recurrence since 05/2008  . CAD (coronary artery disease)    Nuclear stress test Feb 11, 2011, EF 64%, no scar or ischemia    //         catheterization, November, 2011,patent LIMA-LAD-small caliber distal vessel with diffuse plaque, patent SVG to OM1 and OM 2, patent SVG to PDA and PLA, occluded SVG to diagonal, medical therapy;  Lexiscan Myoview 6/14:  Inferior thinning, no ischemia, EF 61%, low risk  . CHF (congestive heart failure) ([place])   . Chronic back pain   . Diabetes mellitus   . Dyslipidemia   . Ejection fraction    EF 65%, echo, [place], Jan 16, 2011  . Hx of CABG    2007  . Hx of colonic polyps   . Hyperlipidemia   . Hypertension   . Myocardial infarction ([place])   . Nausea & vomiting    hospitalization, November, 2011, stricture GE junction, questionable stenosis gastrojejunostomy, disruptive primary peristaltic wave, GE reflux, delayed emptying proximal gastric pouch  . Normal cardiac stress test 02/2013  . Overweight(278.02)    stomach stapling 1985 followed by weight loss, then return of weight  . Sciatica   . Sleep apnea    CPAP being arranged May, 2011//does not use CPAP  . Tobacco abuse    in the past,  resolved  . UTI (urinary tract infection)     Past Surgical History:  Procedure Laterality Date  . ABDOMINAL HYSTERECTOMY  1989  . CARDIAC CATHETERIZATION N/A 12/20/2015   Procedure: Left Heart Cath and Cors/Grafts Angiography;  Surgeon: Leonie Man, MD;  Location: [place] [facility];  Service: Cardiovascular;  Laterality: N/A;  . CORONARY ARTERY BYPASS GRAFT  2009  . CORONARY STENT PLACEMENT    . GASTRIC BYPASS  1983   open.  [place]  . MASTECTOMY Bilateral 1987     Current Outpatient Medications  Medication Sig Dispense Refill  . acetaminophen (TYLENOL) 500 MG tablet Take 1 tablet (500 mg total) every 6 (six) hours as needed by mouth. 30  tablet 0  . albuterol (PROVENTIL HFA;VENTOLIN HFA) 108 (90 BASE) MCG/ACT inhaler Inhale 2 puffs into the lungs every 6 (six) hours as needed for shortness of breath.     [name] Kitchen aspirin EC 81 MG tablet Take 81 mg by mouth daily.     . cetirizine (ZYRTEC) 10 MG tablet Take 1 tablet (10 mg total) by mouth daily. 30 tablet 0  . Cholecalciferol (VITAMIN D-3 PO) Take 1 capsule by mouth daily.    . fluticasone (FLONASE) 50 MCG/ACT nasal spray Place 2 sprays into both nostrils daily. 16 g 0  . glipiZIDE (GLUCOTROL XL) 10 MG 24 hr tablet Take 1 tablet (10 mg total) by mouth daily with breakfast. 30 tablet 9  . HYDROcodone-acetaminophen (NORCO) 10-325 MG tablet Take 1 tablet by mouth 2 (two) times daily as needed for moderate pain.    [name] Kitchen latanoprost (XALATAN) 0.005 % ophthalmic solution Place 1 drop into both eyes at bedtime.    [name] Kitchen lisinopril-hydrochlorothiazide (PRINZIDE,ZESTORETIC) 20-12.5 MG per tablet Take 1 tablet by mouth daily.      . metoprolol tartrate (LOPRESSOR) 25 MG tablet TAKE 1 TABLET BY MOUTH TWICE A DAY 180 tablet 2  . naproxen (NAPROSYN) 375 MG tablet Take 1 tablet (375 mg total) by mouth 2 (two) times daily. 30 tablet 0  . nitroGLYCERIN (NITROSTAT) 0.4 MG SL tablet Place 1 tablet (0.4 mg total) under the tongue every 5 (five) minutes as needed for chest pain. 25 tablet 3  . rosuvastatin (CRESTOR) 5 MG tablet Take 5 mg by mouth at bedtime.     No current facility-administered medications for this visit.     Allergies:   Latex    Social History:  The patient  reports that she quit smoking about 17 years ago. Her smoking use included cigarettes. She has a 81.00 pack-year smoking history. She has never used smokeless tobacco. She reports that she does not drink alcohol or use drugs.   Family History:  The patient's family history includes Asthma in her sister; Colon cancer in her father; Colon cancer (age of onset: 50) in her sister; Diabetes in her mother; Heart disease in her father, mother,  and sister; Hypertension in her mother; Stomach cancer in her sister.    ROS:  Please see the history of present illness.   All other review of systems negative.  PHYSICAL EXAM: VS:  BP 120/60   Pulse 68   Ht 4\' 11"  (1.499 m)   Wt 212 lb 6.4 oz (96.3 kg)   SpO2 96%   BMI 42.90 kg/m  , BMI Body mass index is 42.9 kg/m. GEN: Well nourished, well developed, in no acute distress, obese HEENT: normal  Neck: no JVD, carotid bruits, or masses Cardiac: RRR; no murmurs, rubs, or gallops,no edema  Respiratory:  clear to auscultation bilaterally, normal work of breathing GI: soft, nontender, nondistended, + BS MS: no deformity or atrophy  Skin: warm and dry, no rash Neuro:  Alert and Oriented x 3, Strength and sensation are intact Psych: euthymic mood, full affect   EKG:  01/15/17 sinus bradycardia rate 50 with nonspecific T-wave flattening, no other abnormalities personally viewed.   Recent Labs: 07/13/2018: BUN 8; Creatinine, Ser 0.76; Hemoglobin 12.9; Platelets 171; Potassium 4.0; Sodium 137    Lipid Panel    Component Value Date/Time   CHOL  05/17/2009 0130    139        ATP III CLASSIFICATION:  <200     mg/dL   Desirable  200-239  mg/dL   Borderline High  >=240    mg/dL   High          TRIG 93 05/17/2009 0130   HDL 41 05/17/2009 0130   CHOLHDL 3.4 05/17/2009 0130   VLDL 19 05/17/2009 0130   LDLCALC  05/17/2009 0130    79        Total Cholesterol/HDL:CHD Risk Coronary Heart Disease Risk Table                     Men   Women  1/2 Average Risk   3.4   3.3  Average Risk       5.0   4.4  2 X Average Risk   9.6   7.1  3 X Average Risk  23.4   11.0        Use the calculated Patient Ratio above and the CHD Risk Table to determine the patient's CHD Risk.        ATP III CLASSIFICATION (LDL):  <100     mg/dL   Optimal  100-129  mg/dL   Near or Above                    Optimal  130-159  mg/dL   Borderline  160-189  mg/dL   High  >190     mg/dL   Very High      Wt  Readings from Last 3 Encounters:  08/23/18 212 lb 6.4 oz (96.3 kg)  01/21/18 211 lb (95.7 kg)  08/16/17 215 lb (97.5 kg)      Other studies Reviewed: Additional studies/ records that were reviewed today include: Prior medical records reviewed, lab work reviewed. Review of the above records demonstrates: as above  Cath 12/20/15:  Prox RCA to Mid RCA lesion, 55% stenosed. Mid RCA to Dist RCA lesion, 100% stenosed. Mild disease of the Ostial-Prox SVG-dRCA(filling PLA & PDA).  Mid LAD lesion, 100% stenosed. LIMA-LAD is patent, but very small to a small distal LAD. Very tortuous graft - not a good option to attempt PCI through.  Prox Cx to Mid Cx lesion, 100% stenosed. The lesion was previously treated with a stent (unknown type) greater than two years ago. Mid Cx lesion, 100% stenosed. Patent SVG-OM2 & OM3.  Origin to Prox SVG-Diag Graft lesion, 100% stenosed. Known.  Sequential SVG-OM2-OM3 was injected is large & widely patent.  The left ventricular systolic function is normal.   No real change in anatomy from 2011 catheterization. No culprit lesion to explain the patient's stress test.  Normal EF with normal EDP and stable coronary arteries.  ASSESSMENT AND PLAN:  Coronary artery disease/angina -Cath 12/20/15 reassuring, no ischemia, normal EF, no change in anatomy -Aspirin 81.  Continue with metoprolol.  -  Prior visit to ER atypical chest pain.  Left shoulder pain.  Reassurance.   History of CABG bypass surgery 2007 -Cardiac catheterization as described above -Discussed her prior cardiac catheterization again.  Reassuring.  Hyperlipidemia -Continue with Crestor 5 mg at bedtime.  Had trouble tolerating higher dosing.  We will check a lipid panel and ALT.  Morbid obesity -Encourage continued weight loss -Gastric bypass (was 525 lbs at one point), amazing weight loss.  We discussed again today. - Has some residual abdominal discomfort at scar site she thinks. No evidence of  cholecystitis, normal gallbladder ultrasound previously.   Essential hypertension -No changes in medications. Well controlled.  Angina -metoprolol, antianginal. Stable coronary disease as described above.  Since she has been doing quite well without any anginal symptoms, we will try to stop her isosorbide to see how she does.  Obviously if this returns, we can always restart.  She was taking 30 mg.  Former smoker -quit over 10 years ago.  No changes.  Current medicines are reviewed at length with the patient today.  The patient does not have concerns regarding medicines.  The following changes have been made:  no change  Labs/ tests ordered today include:  Orders Placed This Encounter  Procedures  . ALT  . Lipid panel   Disposition:    6 months follow up Cecille Rubin, one year me    Signed, Candee Furbish, MD  08/23/2018 12:04 PM    [facility] [address], [place], [place]  [address] Phone: [phone]; Fax: [phone]

## 2018-08-26 ENCOUNTER — Other Ambulatory Visit: Payer: Self-pay | Admitting: *Deleted

## 2018-08-26 DIAGNOSIS — E785 Hyperlipidemia, unspecified: Secondary | ICD-10-CM

## 2018-08-26 MED ORDER — ROSUVASTATIN CALCIUM 10 MG PO TABS: 10.0000 mg | ORAL_TABLET | Freq: Every day | ORAL | 3 refills | Status: AC

## 2018-10-27 ENCOUNTER — Other Ambulatory Visit: Payer: Medicare Other

## 2018-11-12 ENCOUNTER — Emergency Department (HOSPITAL_COMMUNITY): Payer: Medicare Other

## 2018-11-12 ENCOUNTER — Other Ambulatory Visit: Payer: Self-pay

## 2018-11-12 ENCOUNTER — Emergency Department (HOSPITAL_COMMUNITY)
Admission: EM | Admit: 2018-11-12 | Discharge: 2018-11-13 | Disposition: A | Discharge status: Home / Self Care | Payer: Medicare Other | Attending: Emergency Medicine | Admitting: Emergency Medicine

## 2018-11-12 DIAGNOSIS — X509XXA Other and unspecified overexertion or strenuous movements or postures, initial encounter: Secondary | ICD-10-CM | POA: Diagnosis not present

## 2018-11-12 DIAGNOSIS — M25561 Pain in right knee: Secondary | ICD-10-CM | POA: Diagnosis not present

## 2018-11-12 DIAGNOSIS — Z951 Presence of aortocoronary bypass graft: Secondary | ICD-10-CM | POA: Diagnosis not present

## 2018-11-12 DIAGNOSIS — R059 Cough, unspecified: Secondary | ICD-10-CM

## 2018-11-12 DIAGNOSIS — S46811A Strain of other muscles, fascia and tendons at shoulder and upper arm level, right arm, initial encounter: Secondary | ICD-10-CM | POA: Diagnosis not present

## 2018-11-12 DIAGNOSIS — Z7982 Long term (current) use of aspirin: Secondary | ICD-10-CM | POA: Insufficient documentation

## 2018-11-12 DIAGNOSIS — J45909 Unspecified asthma, uncomplicated: Secondary | ICD-10-CM | POA: Insufficient documentation

## 2018-11-12 DIAGNOSIS — Y9384 Activity, sleeping: Secondary | ICD-10-CM | POA: Diagnosis not present

## 2018-11-12 DIAGNOSIS — I110 Hypertensive heart disease with heart failure: Secondary | ICD-10-CM | POA: Insufficient documentation

## 2018-11-12 DIAGNOSIS — G8929 Other chronic pain: Secondary | ICD-10-CM | POA: Diagnosis not present

## 2018-11-12 DIAGNOSIS — I2510 Atherosclerotic heart disease of native coronary artery without angina pectoris: Secondary | ICD-10-CM | POA: Diagnosis not present

## 2018-11-12 DIAGNOSIS — Y999 Unspecified external cause status: Secondary | ICD-10-CM | POA: Insufficient documentation

## 2018-11-12 DIAGNOSIS — E119 Type 2 diabetes mellitus without complications: Secondary | ICD-10-CM | POA: Diagnosis not present

## 2018-11-12 DIAGNOSIS — R05 Cough: Secondary | ICD-10-CM

## 2018-11-12 DIAGNOSIS — Y92003 Bedroom of unspecified non-institutional (private) residence as the place of occurrence of the external cause: Secondary | ICD-10-CM | POA: Insufficient documentation

## 2018-11-12 DIAGNOSIS — S4991XA Unspecified injury of right shoulder and upper arm, initial encounter: Secondary | ICD-10-CM | POA: Diagnosis present

## 2018-11-12 DIAGNOSIS — I509 Heart failure, unspecified: Secondary | ICD-10-CM | POA: Diagnosis not present

## 2018-11-12 LAB — I-STAT TROPONIN, ED: Troponin i, poc: 0 ng/mL (ref 0.00–0.08)

## 2018-11-12 LAB — BASIC METABOLIC PANEL
ANION GAP: 8 (ref 5–15)
BUN: 9 mg/dL (ref 8–23)
CO2: 24 mmol/L (ref 22–32)
Calcium: 9.3 mg/dL (ref 8.9–10.3)
Chloride: 108 mmol/L (ref 98–111)
Creatinine, Ser: 0.74 mg/dL (ref 0.44–1.00)
GFR calc Af Amer: >60 mL/min (ref >60)
GFR calc non Af Amer: >60 mL/min (ref >60)
Glucose, Bld: 84 mg/dL (ref 70–99)
Potassium: 3.7 mmol/L (ref 3.5–5.1)
Sodium: 140 mmol/L (ref 135–145)

## 2018-11-12 LAB — CBC
HCT: 41.5 % (ref 36.0–46.0)
Hemoglobin: 12.8 g/dL (ref 12.0–15.0)
MCH: 27.8 pg (ref 26.0–34.0)
MCHC: 30.8 g/dL (ref 30.0–36.0)
MCV: 90 fL (ref 80.0–100.0)
Platelets: 193 10*3/uL (ref 150–400)
RBC: 4.61 MIL/uL (ref 3.87–5.11)
RDW: 14.5 % (ref 11.5–15.5)
WBC: 7.7 10*3/uL (ref 4.0–10.5)
nRBC: 0 % (ref 0.0–0.2)

## 2018-11-12 MED ORDER — SODIUM CHLORIDE 0.9% FLUSH
3.0000 mL | Freq: Once | INTRAVENOUS | Status: AC
  Administered 2018-11-12: 3 mL via INTRAVENOUS

## 2018-11-12 MED ORDER — KETOROLAC TROMETHAMINE 30 MG/ML IJ SOLN
30.0000 mg | Freq: Once | INTRAMUSCULAR | Status: AC
  Administered 2018-11-12: 30 mg via INTRAVENOUS
  Filled 2018-11-12: qty 1

## 2018-11-12 MED ORDER — CYCLOBENZAPRINE HCL 10 MG PO TABS
5.0000 mg | ORAL_TABLET | Freq: Once | ORAL | Status: AC
  Administered 2018-11-12: 5 mg via ORAL
  Filled 2018-11-12: qty 1

## 2018-11-12 NOTE — ED Notes (Signed)
Patient discharged from facility. Patient and family verbalized understanding of discharge instructions. Patient left in wheelchair with family.

## 2018-11-12 NOTE — ED Provider Notes (Signed)
[facility] EMERGENCY DEPARTMENT Provider Note   CSN: [id number] Arrival date & time: 11/12/18  2034    History   Chief Complaint Chief Complaint  Patient presents with  . Shoulder Pain  . Sore Throat    HPI Patricia Cuevas is a 68 y.o. female with h/o arthritis, CAD s/p CABG, breast cancer s/p double mastectomy, chronic back pain, DM, HLD, HTN, is here for evaluation of bilateral shoulder pain R>L, neck pain, right knee pain.  Onset yesterday. She woke up and tried to get out of bed and had sudden pain in these areas.  Pain is 10/10, sharp, worse with any movement, palpation.  Has vicodin prescribed for chronic "pains" that she took with moderate relief.  No recent falls. No recent neck or head trauma. No h/o gout. No associated joint swelling or calf pain.  She has no CP, SOB, palpitations.  Additionally reports last week she had a sore throat which resolved.  Currently still having runny nose and intermittently productive cough with yellow phlegm as well. She has been using OTC cough syrup with good control of cough.  Denies sick contacts. Denies fevers. HPI  Past Medical History:  Diagnosis Date  . Alcohol abuse    in the past, resolved  . Arthritis   . Asthma   . Blood transfusion without reported diagnosis 1984   after surgery  . Breast cancer [facility])    Double mastectomy 2007; s/p tamoxifen and arimidex therapy; no recurrence since 05/2008  . CAD (coronary artery disease)    Nuclear stress test Feb 11, 2011, EF 64%, no scar or ischemia    //         catheterization, November, 2011,patent LIMA-LAD-small caliber distal vessel with diffuse plaque, patent SVG to OM1 and OM 2, patent SVG to PDA and PLA, occluded SVG to diagonal, medical therapy;  Lexiscan Myoview 6/14:  Inferior thinning, no ischemia, EF 61%, low risk  . CHF (congestive heart failure) ([place])   . Chronic back pain   . Diabetes mellitus   . Dyslipidemia   . Ejection fraction    EF 65%, echo, [place], Jan 16, 2011  . Hx of CABG    2007  . Hx of colonic polyps   . Hyperlipidemia   . Hypertension   . Myocardial infarction ([place])   . Nausea & vomiting    hospitalization, November, 2011, stricture GE junction, questionable stenosis gastrojejunostomy, disruptive primary peristaltic wave, GE reflux, delayed emptying proximal gastric pouch  . Normal cardiac stress test 02/2013  . Overweight(278.02)    stomach stapling 1985 followed by weight loss, then return of weight  . Sciatica   . Sleep apnea    CPAP being arranged May, 2011//does not use CPAP  . Tobacco abuse    in the past, resolved  . UTI (urinary tract infection)     Patient Active Problem List   Diagnosis Date Noted  . Asthma 03/07/2017  . Sleep apnea 03/07/2017  . Diabetes mellitus type 2, controlled ([place]) 03/07/2017  . Hyperlipidemia 03/07/2017  . CAD (coronary artery disease) with prior CABG 03/07/2017  . UTI (urinary tract infection) 03/07/2017  . Sepsis ([place]) 03/07/2017  . Sepsis due to urinary tract infection ([place]) 03/07/2017  . HTN (hypertension) 03/07/2017  . Constipation by delayed colonic transit 03/07/2017  . Abnormal nuclear stress test 12/20/2015  . Angina, class III ([place]) 12/20/2015  . Chest pain 11/12/2014  . Upper GI bleed 03/04/2013  . Atypical chest pain  01/07/2013  . History of tobacco abuse 01/07/2013  . SIRS (systemic inflammatory response syndrome) ([place]) 07/28/2012  . Tachycardia 07/28/2012  . GERD (gastroesophageal reflux disease) 02/24/2011  . Coronary artery disease involving native coronary artery with angina pectoris ([place])   . Hx of CABG   . Overweight   . OSA (obstructive sleep apnea)   . Nausea & vomiting   . Breast cancer ([place])   . Hypertension   . Diabetes mellitus ([place])   . Dyslipidemia   . Ejection fraction     Past Surgical History:  Procedure Laterality Date  . ABDOMINAL HYSTERECTOMY  1989  . CARDIAC CATHETERIZATION N/A 12/20/2015   Procedure: Left Heart Cath and  Cors/Grafts Angiography;  Surgeon: Leonie Man, MD;  Location: [place] [facility];  Service: Cardiovascular;  Laterality: N/A;  . CORONARY ARTERY BYPASS GRAFT  2009  . CORONARY STENT PLACEMENT    . GASTRIC BYPASS  1983   open.  [place]  . MASTECTOMY Bilateral 1987     OB History   No obstetric history on file.      Home Medications    Prior to Admission medications   Medication Sig Start Date End Date Taking? Authorizing Provider  albuterol (PROVENTIL HFA;VENTOLIN HFA) 108 (90 BASE) MCG/ACT inhaler Inhale 2 puffs into the lungs every 6 (six) hours as needed for shortness of breath.    Yes [provider]  aspirin EC 81 MG tablet Take 81 mg by mouth 2 (two) times daily.    Yes [provider]  cetirizine (ZYRTEC) 10 MG tablet Take 1 tablet (10 mg total) by mouth daily. 08/16/17  Yes Maczis, Barth Kirks, PA-C  cholecalciferol (VITAMIN D3) 25 MCG (1000 UT) tablet Take 1,000 Units by mouth daily.   Yes [provider]  fluticasone (FLONASE) 50 MCG/ACT nasal spray Place 2 sprays into both nostrils daily. 08/16/17  Yes Maczis, Barth Kirks, PA-C  glipiZIDE (GLUCOTROL XL) 10 MG 24 hr tablet Take 1 tablet (10 mg total) by mouth daily with breakfast. 12/17/15  Yes [name], NP  HYDROcodone-acetaminophen (NORCO) 10-325 MG tablet Take 1 tablet by mouth 2 (two) times daily as needed for moderate pain.   Yes [provider]  latanoprost (XALATAN) 0.005 % ophthalmic solution Place 1 drop into both eyes at bedtime.   Yes [provider]  lisinopril-hydrochlorothiazide (PRINZIDE,ZESTORETIC) 20-12.5 MG per tablet Take 1 tablet by mouth daily.     Yes [provider]  metoprolol tartrate (LOPRESSOR) 25 MG tablet TAKE 1 TABLET BY MOUTH TWICE A DAY Patient taking differently: Take 25 mg by mouth 2 (two) times daily.  04/18/18  Yes [name], MD  naproxen (NAPROSYN) 375 MG tablet Take 1 tablet (375 mg total) by mouth 2 (two) times daily. Patient  taking differently: Take 375 mg by mouth 2 (two) times daily as needed (pain).  81/82/99  Yes [name], MD  nitroGLYCERIN (NITROSTAT) 0.4 MG SL tablet Place 1 tablet (0.4 mg total) under the tongue every 5 (five) minutes as needed for chest pain. 01/15/17  Yes [name], MD  rosuvastatin (CRESTOR) 10 MG tablet Take 1 tablet (10 mg total) by mouth at bedtime. 08/26/18  Yes [name], MD  acetaminophen (TYLENOL) 500 MG tablet Take 1 tablet (500 mg total) every 6 (six) hours as needed by mouth. Patient not taking: Reported on 11/12/2018 07/27/17   Jeannett Senior, PA-C    Family History Family History  Problem Relation Age of Onset  .  Hypertension Mother   . Heart disease Mother   . Diabetes Mother   . Heart disease Father   . Colon cancer Father        does not know age of onset  . Heart disease Sister   . Colon cancer Sister 66  . Stomach cancer Sister   . Asthma Sister   . Esophageal cancer Neg Hx   . Rectal cancer Neg Hx     Social History Social History   Tobacco Use  . Smoking status: Former Smoker    Packs/day: 3.00    Years: 27.00    Pack years: 81.00    Types: Cigarettes    Last attempt to quit: 09/14/2000    Years since quitting: 18.1  . Smokeless tobacco: Never Used  Substance Use Topics  . Alcohol use: No  . Drug use: No     Allergies   Latex   Review of Systems Review of Systems  HENT: Positive for rhinorrhea and sore throat (resolved).   Respiratory: Positive for cough.   Musculoskeletal: Positive for arthralgias and neck pain.  All other systems reviewed and are negative.    Physical Exam Updated Vital Signs BP 137/66   Pulse 62   Temp 98.3 F (36.8 C) (Oral)   Resp 16   Ht 4' (1.219 m)   Wt 97.5 kg   SpO2 100%   BMI 65.61 kg/m   Physical Exam Vitals signs and nursing note reviewed.  Constitutional:      Appearance: She is well-developed.     Comments: Non toxic  HENT:     Head: Normocephalic and atraumatic.     Nose:  Nose normal.     Comments: No nasal mucosal edema, rhinorrhea.     Mouth/Throat:     Tonsils: No tonsillar exudate. Swelling: 0 on the right. 0 on the left.     Comments: Oropharynx and tonsils normal  Eyes:     Conjunctiva/sclera: Conjunctivae normal.     Pupils: Pupils are equal, round, and reactive to light.  Neck:     Musculoskeletal: Normal range of motion. Muscular tenderness present.     Comments: Right trapezius tenderness, pain with neck bend a rotation. No midline c-spine tenderness. Full ROM meningismus. Mild bilateral submandibular LAD, tender.  Cardiovascular:     Rate and Rhythm: Normal rate and regular rhythm.  Pulmonary:     Effort: Pulmonary effort is normal.     Breath sounds: Normal breath sounds.  Abdominal:     General: Bowel sounds are normal.     Palpations: Abdomen is soft.     Tenderness: There is no abdominal tenderness.     Comments: No G/R/R. No suprapubic or CVA tenderness. Negative Murphy's and McBurney's. Active BS to lower quadrants.   Musculoskeletal: Normal range of motion.     Right knee: Tenderness found.     Comments: R knee: TTP to area below knee cap.  no skin erythema, warmth. No crepitus. No laxity. No popliteal space tenderness. No asymmetric lower leg edema or calf tenderness.   Lymphadenopathy:     Cervical: Cervical adenopathy present.  Skin:    General: Skin is warm and dry.     Capillary Refill: Capillary refill takes less than 2 seconds.  Neurological:     Mental Status: She is alert and oriented to person, place, and time.  Psychiatric:        Behavior: Behavior normal.      ED Treatments / Results  Labs (  all labs ordered are listed, but only abnormal results are displayed) Labs Reviewed  BASIC METABOLIC PANEL  CBC  I-STAT TROPONIN, ED    EKG EKG Interpretation  Date/Time:  Saturday November 12 2018 20:49:25 EST Ventricular Rate:  68 PR Interval:    QRS Duration: 94 QT Interval:  399 QTC Calculation: 425 R  Axis:   24 Text Interpretation:  Sinus rhythm Borderline T abnormalities, anterior leads similar to prior 10/19 Confirmed by Aletta Edouard [phone]) on 11/12/2018 8:51:31 PM   Radiology Dg Chest 2 View  Result Date: 11/12/2018 CLINICAL DATA:  Bilateral shoulder pain. Sore throat with congestion and runny nose beginning on Wednesday. EXAM: CHEST - 2 VIEW COMPARISON:  07/13/2018 FINDINGS: Elevation of the right hemidiaphragm is unchanged. Postoperative changes in the mediastinum and right axillary region. Heart size and pulmonary vascularity are normal for technique. No airspace disease or consolidation in the lungs. No blunting of costophrenic angles. No pneumothorax. Mediastinal contours appear intact. IMPRESSION: No active cardiopulmonary disease. Electronically Signed   By: Lucienne Capers M.D.   On: 11/12/2018 22:55    Procedures Procedures (including critical care time)  Medications Ordered in ED Medications  sodium chloride flush (NS) 0.9 % injection 3 mL (3 mLs Intravenous Given 11/12/18 2314)  ketorolac (TORADOL) 30 MG/ML injection 30 mg (30 mg Intravenous Given 11/12/18 2312)  cyclobenzaprine (FLEXERIL) tablet 5 mg (5 mg Oral Given 11/12/18 2312)     Initial Impression / Assessment and Plan / ED Course  I have reviewed the triage vital signs and the nursing notes.  Pertinent labs & imaging results that were available during my care of the patient were reviewed by me and considered in my medical decision making (see chart for details).  Clinical Course as of Feb 29 2359  Sat Feb 29, 419  [id number] 68 year old female here with 2 days of bilateral shoulder pain and also she has had some pain in her knee.  She is had some sinus symptoms for few days although no fever.  Her vitals are unremarkable and her initial screening labs chest x-ray EKG are also unremarkable.  She likely can be discharged to follow-up with her PCP.   [MB]    Clinical Course User Index [MB] Hayden Rasmussen, MD        68 yo with acute on chronic right knee pain, bilateral shoulder pain, neck pain, rhinorrhea, cough.  Chart review she has h/o chronic back pain, neck pain and knee pain.  No recent trauma. No overlaying signs of infection over these joints. She has reproducible R trapezius tenderness and pain with neck bend/rotation which suggests MSK etiology. No fevers, meningismus, distal paresthesias. No associated CP, SOB with her arthralgias today.  Triage obtained labs, trop, EKG which were unremarkable. Again, she denies CP.  I suspect her arthralgias are MSK/arthritis related.  I doubt emergent process today.  Right knee does not look to be septic. No fever, leukocytosis.  She has had yearly right knee x-rays with last one showing severe arthritis.  No distal LE edema or calf tenderness. This is unlikely to be a DVT/hemarthrosis.  She was encouraged to f/u with orthopedist, continue vicodin prn.    Rhinorrhea, cough likely viral. CXR w/o infiltrate.  Symptomatic care for this. Return precautions given. Pt in agreement.  Final Clinical Impressions(s) / ED Diagnoses   Final diagnoses:  Chronic pain of right knee  Trapezius strain, right, initial encounter  Cough    ED Discharge Orders    None  Kinnie Feil, PA-C 11/12/18 2359    Hayden Rasmussen, MD 11/13/18 [phone]

## 2018-11-12 NOTE — Discharge Instructions (Addendum)
You were seen in the ER for shoulder pain, neck pain, right knee pain, runny nose, cough.   Labs, chest x-rays are normal.   I suspect your joint aches are from arthritis.  You need to follow up with orthopedist for more discussion of your worsening right knee pain likely from arthritis.    Runny nose, cough, sore throat are likely from a virus.  Use over the counter nasal decongestants (sudafed).    Return for fever, chest pain, shortness of breath, joint swelling or redness, calf pain.

## 2018-11-12 NOTE — ED Triage Notes (Signed)
Patient complaining of pain in her shoulders bilaterally and right knee. Patient states the pain is a 10/10 and is sharp and stabbing. Pain started today. Patient states pain gets worse with movement. Took 1 Vicodin to decrease pain. Patient also complaining of sore throat with congestion and runny nose. Sore throat began on Wednesday. Patient taking "cough syrup" to help.

## 2018-11-15 ENCOUNTER — Other Ambulatory Visit: Payer: Medicare Other

## 2018-11-15 DIAGNOSIS — E785 Hyperlipidemia, unspecified: Secondary | ICD-10-CM

## 2018-11-16 ENCOUNTER — Telehealth: Payer: Self-pay

## 2018-11-16 LAB — LIPID PANEL
CHOL/HDL RATIO: 3.4 ratio (ref 0.0–4.4)
Cholesterol, Total: 158 mg/dL (ref 100–199)
HDL: 46 mg/dL (ref >39)
LDL Calculated: 84 mg/dL (ref 0–99)
Triglycerides: 138 mg/dL (ref 0–149)
VLDL Cholesterol Cal: 28 mg/dL (ref 5–40)

## 2018-11-16 NOTE — Telephone Encounter (Signed)
-----   Message from [name], MD sent at 11/16/2018 10:05 AM EST ----- Continue with Crestor.  LDL 84 Candee Furbish, MD

## 2018-11-16 NOTE — Telephone Encounter (Signed)
Notes recorded by Frederik Schmidt, RN on 11/16/2018 at 10:14 AM EST The patient has been notified of the result and verbalized understanding. All questions (if any) were answered. Frederik Schmidt, RN 11/16/2018 10:14 AM

## 2019-01-09 ENCOUNTER — Other Ambulatory Visit: Payer: Self-pay | Admitting: Cardiology

## 2019-01-09 DIAGNOSIS — R0789 Other chest pain: Secondary | ICD-10-CM

## 2019-04-08 ENCOUNTER — Other Ambulatory Visit: Payer: Self-pay | Admitting: Cardiology

## 2019-04-08 DIAGNOSIS — R0789 Other chest pain: Secondary | ICD-10-CM

## 2019-04-14 ENCOUNTER — Encounter: Payer: Self-pay | Admitting: Gastroenterology

## 2019-05-29 ENCOUNTER — Emergency Department (HOSPITAL_COMMUNITY)
Admission: EM | Admit: 2019-05-29 | Discharge: 2019-05-30 | Disposition: A | Discharge status: Home / Self Care | Payer: Medicare Other | Attending: Emergency Medicine | Admitting: Emergency Medicine

## 2019-05-29 ENCOUNTER — Other Ambulatory Visit: Payer: Self-pay

## 2019-05-29 ENCOUNTER — Encounter (HOSPITAL_COMMUNITY): Payer: Self-pay | Admitting: Pharmacy Technician

## 2019-05-29 DIAGNOSIS — Z5321 Procedure and treatment not carried out due to patient leaving prior to being seen by health care provider: Secondary | ICD-10-CM | POA: Insufficient documentation

## 2019-05-29 DIAGNOSIS — R531 Weakness: Secondary | ICD-10-CM | POA: Diagnosis present

## 2019-05-29 LAB — COMPREHENSIVE METABOLIC PANEL
ALT: 17 U/L (ref 0–44)
AST: 24 U/L (ref 15–41)
Albumin: 3.9 g/dL (ref 3.5–5.0)
Alkaline Phosphatase: 66 U/L (ref 38–126)
Anion gap: 11 (ref 5–15)
BUN: 11 mg/dL (ref 8–23)
CO2: 24 mmol/L (ref 22–32)
Calcium: 9.5 mg/dL (ref 8.9–10.3)
Chloride: 105 mmol/L (ref 98–111)
Creatinine, Ser: 0.86 mg/dL (ref 0.44–1.00)
GFR calc Af Amer: >60 mL/min (ref >60)
GFR calc non Af Amer: >60 mL/min (ref >60)
Glucose, Bld: 109 mg/dL — ABNORMAL HIGH (ref 70–99)
Potassium: 4.3 mmol/L (ref 3.5–5.1)
Sodium: 140 mmol/L (ref 135–145)
Total Bilirubin: 0.3 mg/dL (ref 0.3–1.2)
Total Protein: 6.8 g/dL (ref 6.5–8.1)

## 2019-05-29 LAB — CBC
HCT: 43 % (ref 36.0–46.0)
Hemoglobin: 14 g/dL (ref 12.0–15.0)
MCH: 29.5 pg (ref 26.0–34.0)
MCHC: 32.6 g/dL (ref 30.0–36.0)
MCV: 90.7 fL (ref 80.0–100.0)
Platelets: 170 10*3/uL (ref 150–400)
RBC: 4.74 MIL/uL (ref 3.87–5.11)
RDW: 14.6 % (ref 11.5–15.5)
WBC: 5.8 10*3/uL (ref 4.0–10.5)
nRBC: 0 % (ref 0.0–0.2)

## 2019-05-29 LAB — LIPASE, BLOOD: Lipase: 29 U/L (ref 11–51)

## 2019-05-29 MED ORDER — SODIUM CHLORIDE 0.9% FLUSH: 3.0000 mL | Freq: Once | INTRAVENOUS | Status: DC

## 2019-05-29 NOTE — ED Triage Notes (Signed)
Pt c/o R sided abdominal pain onset today. Also endorses generalized weakness and fatigue. Reports nausea, denies emesis or diarrhea.

## 2019-05-30 NOTE — ED Notes (Signed)
Pt refused temperature recheck.

## 2019-05-30 NOTE — ED Notes (Signed)
Pt leaving AMA. States she doesn't want to wait any longer.

## 2019-06-05 ENCOUNTER — Emergency Department (HOSPITAL_COMMUNITY): Payer: Medicare Other

## 2019-06-05 ENCOUNTER — Emergency Department (HOSPITAL_COMMUNITY)
Admission: EM | Admit: 2019-06-05 | Discharge: 2019-06-05 | Disposition: A | Discharge status: Home / Self Care | Payer: Medicare Other | Attending: Emergency Medicine | Admitting: Emergency Medicine

## 2019-06-05 ENCOUNTER — Other Ambulatory Visit: Payer: Self-pay

## 2019-06-05 ENCOUNTER — Encounter (HOSPITAL_COMMUNITY): Payer: Self-pay | Admitting: Emergency Medicine

## 2019-06-05 DIAGNOSIS — E119 Type 2 diabetes mellitus without complications: Secondary | ICD-10-CM | POA: Diagnosis not present

## 2019-06-05 DIAGNOSIS — Z79899 Other long term (current) drug therapy: Secondary | ICD-10-CM | POA: Diagnosis not present

## 2019-06-05 DIAGNOSIS — M5412 Radiculopathy, cervical region: Secondary | ICD-10-CM | POA: Insufficient documentation

## 2019-06-05 DIAGNOSIS — J45909 Unspecified asthma, uncomplicated: Secondary | ICD-10-CM | POA: Diagnosis not present

## 2019-06-05 DIAGNOSIS — I509 Heart failure, unspecified: Secondary | ICD-10-CM | POA: Insufficient documentation

## 2019-06-05 DIAGNOSIS — Z951 Presence of aortocoronary bypass graft: Secondary | ICD-10-CM | POA: Diagnosis not present

## 2019-06-05 DIAGNOSIS — I110 Hypertensive heart disease with heart failure: Secondary | ICD-10-CM | POA: Insufficient documentation

## 2019-06-05 DIAGNOSIS — Z7984 Long term (current) use of oral hypoglycemic drugs: Secondary | ICD-10-CM | POA: Insufficient documentation

## 2019-06-05 DIAGNOSIS — Z7982 Long term (current) use of aspirin: Secondary | ICD-10-CM | POA: Insufficient documentation

## 2019-06-05 DIAGNOSIS — M79601 Pain in right arm: Secondary | ICD-10-CM | POA: Diagnosis present

## 2019-06-05 DIAGNOSIS — Z91040 Latex allergy status: Secondary | ICD-10-CM | POA: Diagnosis not present

## 2019-06-05 DIAGNOSIS — Z87891 Personal history of nicotine dependence: Secondary | ICD-10-CM | POA: Diagnosis not present

## 2019-06-05 DIAGNOSIS — I2510 Atherosclerotic heart disease of native coronary artery without angina pectoris: Secondary | ICD-10-CM | POA: Insufficient documentation

## 2019-06-05 LAB — COMPREHENSIVE METABOLIC PANEL
ALT: 16 U/L (ref 0–44)
AST: 22 U/L (ref 15–41)
Albumin: 3.6 g/dL (ref 3.5–5.0)
Alkaline Phosphatase: 60 U/L (ref 38–126)
Anion gap: 10 (ref 5–15)
BUN: 8 mg/dL (ref 8–23)
CO2: 26 mmol/L (ref 22–32)
Calcium: 9.4 mg/dL (ref 8.9–10.3)
Chloride: 103 mmol/L (ref 98–111)
Creatinine, Ser: 0.88 mg/dL (ref 0.44–1.00)
GFR calc Af Amer: >60 mL/min (ref >60)
GFR calc non Af Amer: >60 mL/min (ref >60)
Glucose, Bld: 121 mg/dL — ABNORMAL HIGH (ref 70–99)
Potassium: 4 mmol/L (ref 3.5–5.1)
Sodium: 139 mmol/L (ref 135–145)
Total Bilirubin: 0.8 mg/dL (ref 0.3–1.2)
Total Protein: 6.9 g/dL (ref 6.5–8.1)

## 2019-06-05 LAB — CBC WITH DIFFERENTIAL/PLATELET
Abs Immature Granulocytes: 0 10*3/uL (ref 0.00–0.07)
Basophils Absolute: 0 10*3/uL (ref 0.0–0.1)
Basophils Relative: 0 %
Eosinophils Absolute: 0.1 10*3/uL (ref 0.0–0.5)
Eosinophils Relative: 1 %
HCT: 41.7 % (ref 36.0–46.0)
Hemoglobin: 13.7 g/dL (ref 12.0–15.0)
Immature Granulocytes: 0 %
Lymphocytes Relative: 53 %
Lymphs Abs: 2.9 10*3/uL (ref 0.7–4.0)
MCH: 29.1 pg (ref 26.0–34.0)
MCHC: 32.9 g/dL (ref 30.0–36.0)
MCV: 88.7 fL (ref 80.0–100.0)
Monocytes Absolute: 0.7 10*3/uL (ref 0.1–1.0)
Monocytes Relative: 12 %
Neutro Abs: 1.9 10*3/uL (ref 1.7–7.7)
Neutrophils Relative %: 34 %
Platelets: 185 10*3/uL (ref 150–400)
RBC: 4.7 MIL/uL (ref 3.87–5.11)
RDW: 14.4 % (ref 11.5–15.5)
WBC: 5.6 10*3/uL (ref 4.0–10.5)
nRBC: 0 % (ref 0.0–0.2)

## 2019-06-05 LAB — TROPONIN I (HIGH SENSITIVITY): Troponin I (High Sensitivity): 4 ng/L (ref <18)

## 2019-06-05 MED ORDER — SODIUM CHLORIDE 0.9 % IV BOLUS
1000.0000 mL | Freq: Once | INTRAVENOUS | Status: AC
  Administered 2019-06-05: 1000 mL via INTRAVENOUS

## 2019-06-05 MED ORDER — HYDROMORPHONE HCL 1 MG/ML IJ SOLN
1.0000 mg | Freq: Once | INTRAMUSCULAR | Status: AC
  Administered 2019-06-05: 1 mg via INTRAVENOUS
  Filled 2019-06-05: qty 1

## 2019-06-05 MED ORDER — PREDNISONE 20 MG PO TABS: ORAL_TABLET | ORAL | 0 refills | Status: DC

## 2019-06-05 NOTE — ED Provider Notes (Signed)
[facility] EMERGENCY DEPARTMENT Provider Note   CSN: [id number] Arrival date & time: 06/05/19  [id number]     History   Chief Complaint Chief Complaint  Patient presents with  . Arm Pain  . Shoulder Pain    HPI Patricia Cuevas is a 68 y.o. female.     HPI 68 year old female presents with right arm pain and tingling.  On and off for about a week.  Patient states that she also has concomitant right-sided chest pain.  Everything feels like a squeezing.  This morning in the middle of the night it was much worse.  There are no symptoms in her legs besides chronic knee pain.  No bowel or bladder incontinence.  No headache.  No facial symptoms such as slurred speech or weakness/numbness.  The patient does not have any weakness but just severe pain.  Has tried some hydrocodone at night but not really having relief.  No shortness of breath, vomiting, or diaphoresis.  Pain is currently a 9 out of 10. Pain gets worse ranging her neck to the left.  Past Medical History:  Diagnosis Date  . Alcohol abuse    in the past, resolved  . Arthritis   . Asthma   . Blood transfusion without reported diagnosis 1984   after surgery  . Breast cancer [facility])    Double mastectomy 2007; s/p tamoxifen and arimidex therapy; no recurrence since 05/2008  . CAD (coronary artery disease)    Nuclear stress test Feb 11, 2011, EF 64%, no scar or ischemia    //         catheterization, November, 2011,patent LIMA-LAD-small caliber distal vessel with diffuse plaque, patent SVG to OM1 and OM 2, patent SVG to PDA and PLA, occluded SVG to diagonal, medical therapy;  Lexiscan Myoview 6/14:  Inferior thinning, no ischemia, EF 61%, low risk  . CHF (congestive heart failure) ([place])   . Chronic back pain   . Diabetes mellitus   . Dyslipidemia   . Ejection fraction    EF 65%, echo, [place], Jan 16, 2011  . Hx of CABG    2007  . Hx of colonic polyps   . Hyperlipidemia   . Hypertension   . Myocardial  infarction ([place])   . Nausea & vomiting    hospitalization, November, 2011, stricture GE junction, questionable stenosis gastrojejunostomy, disruptive primary peristaltic wave, GE reflux, delayed emptying proximal gastric pouch  . Normal cardiac stress test 02/2013  . Overweight(278.02)    stomach stapling 1985 followed by weight loss, then return of weight  . Sciatica   . Sleep apnea    CPAP being arranged May, 2011//does not use CPAP  . Tobacco abuse    in the past, resolved  . UTI (urinary tract infection)     Patient Active Problem List   Diagnosis Date Noted  . Asthma 03/07/2017  . Sleep apnea 03/07/2017  . Diabetes mellitus type 2, controlled ([place]) 03/07/2017  . Hyperlipidemia 03/07/2017  . CAD (coronary artery disease) with prior CABG 03/07/2017  . UTI (urinary tract infection) 03/07/2017  . Sepsis ([place]) 03/07/2017  . Sepsis due to urinary tract infection ([place]) 03/07/2017  . HTN (hypertension) 03/07/2017  . Constipation by delayed colonic transit 03/07/2017  . Abnormal nuclear stress test 12/20/2015  . Angina, class III ([place]) 12/20/2015  . Chest pain 11/12/2014  . Upper GI bleed 03/04/2013  . Atypical chest pain 01/07/2013  . History of tobacco abuse 01/07/2013  . SIRS (systemic inflammatory  response syndrome) ([place]) 07/28/2012  . Tachycardia 07/28/2012  . GERD (gastroesophageal reflux disease) 02/24/2011  . Coronary artery disease involving native coronary artery with angina pectoris ([place])   . Hx of CABG   . Overweight   . OSA (obstructive sleep apnea)   . Nausea & vomiting   . Breast cancer ([place])   . Hypertension   . Diabetes mellitus ([place])   . Dyslipidemia   . Ejection fraction     Past Surgical History:  Procedure Laterality Date  . ABDOMINAL HYSTERECTOMY  1989  . CARDIAC CATHETERIZATION N/A 12/20/2015   Procedure: Left Heart Cath and Cors/Grafts Angiography;  Surgeon: Leonie Man, MD;  Location: [facility];  Service: Cardiovascular;  Laterality:  N/A;  . CORONARY ARTERY BYPASS GRAFT  2009  . CORONARY STENT PLACEMENT    . GASTRIC BYPASS  1983   open.  [place]  . MASTECTOMY Bilateral 1987     OB History   No obstetric history on file.      Home Medications    Prior to Admission medications   Medication Sig Start Date End Date Taking? Authorizing Provider  acetaminophen (TYLENOL) 500 MG tablet Take 1 tablet (500 mg total) every 6 (six) hours as needed by mouth. Patient not taking: Reported on 11/12/2018 07/27/17   Jeannett Senior, PA-C  albuterol (PROVENTIL HFA;VENTOLIN HFA) 108 (90 BASE) MCG/ACT inhaler Inhale 2 puffs into the lungs every 6 (six) hours as needed for shortness of breath.     [provider]  aspirin EC 81 MG tablet Take 81 mg by mouth 2 (two) times daily.     [provider]  cetirizine (ZYRTEC) 10 MG tablet Take 1 tablet (10 mg total) by mouth daily. 08/16/17   Maczis, Barth Kirks, PA-C  cholecalciferol (VITAMIN D3) 25 MCG (1000 UT) tablet Take 1,000 Units by mouth daily.    [provider]  fluticasone (FLONASE) 50 MCG/ACT nasal spray Place 2 sprays into both nostrils daily. 08/16/17   Maczis, Barth Kirks, PA-C  glipiZIDE (GLUCOTROL XL) 10 MG 24 hr tablet Take 1 tablet (10 mg total) by mouth daily with breakfast. 12/17/15   [name], NP  HYDROcodone-acetaminophen (NORCO) 10-325 MG tablet Take 1 tablet by mouth 2 (two) times daily as needed for moderate pain.    [provider]  latanoprost (XALATAN) 0.005 % ophthalmic solution Place 1 drop into both eyes at bedtime.    [provider]  lisinopril-hydrochlorothiazide (PRINZIDE,ZESTORETIC) 20-12.5 MG per tablet Take 1 tablet by mouth daily.      [provider]  metoprolol tartrate (LOPRESSOR) 25 MG tablet TAKE 1 TABLET BY MOUTH 2 TIMES A DAY 04/12/19   [name], MD  naproxen (NAPROSYN) 375 MG tablet Take 1 tablet (375 mg total) by mouth 2 (two) times daily. Patient taking differently: Take 375 mg by  mouth 2 (two) times daily as needed (pain).  0000000   [name], MD  nitroGLYCERIN (NITROSTAT) 0.4 MG SL tablet Place 1 tablet (0.4 mg total) under the tongue every 5 (five) minutes as needed for chest pain. 01/15/17   [name], MD  predniSONE (DELTASONE) 20 MG tablet 3 tabs po daily x 3 days, then 2 tabs x 3 days, then 1.5 tabs x 3 days, then 1 tab x 3 days, then 0.5 tabs x 3 days 06/05/19   Sherwood Gambler, MD  rosuvastatin (CRESTOR) 10 MG tablet Take 1 tablet (10 mg total) by mouth at bedtime. 08/26/18  [name], MD    Family History Family History  Problem Relation Age of Onset  . Hypertension Mother   . Heart disease Mother   . Diabetes Mother   . Heart disease Father   . Colon cancer Father        does not know age of onset  . Heart disease Sister   . Colon cancer Sister 58  . Stomach cancer Sister   . Asthma Sister   . Esophageal cancer Neg Hx   . Rectal cancer Neg Hx     Social History Social History   Tobacco Use  . Smoking status: Former Smoker    Packs/day: 3.00    Years: 27.00    Pack years: 81.00    Types: Cigarettes    Quit date: 09/14/2000    Years since quitting: 18.7  . Smokeless tobacco: Never Used  Substance Use Topics  . Alcohol use: No  . Drug use: No     Allergies   Latex   Review of Systems Review of Systems  Constitutional: Negative for fever.  Respiratory: Negative for shortness of breath.   Cardiovascular: Positive for chest pain.  Gastrointestinal: Negative for nausea and vomiting.  Musculoskeletal: Positive for neck pain.  Neurological: Positive for numbness (tingling). Negative for weakness and headaches.  All other systems reviewed and are negative.    Physical Exam Updated Vital Signs BP 101/65   Pulse (!) 54   Temp 98.4 F (36.9 C) (Oral)   Resp 13   SpO2 98%   Physical Exam Vitals signs and nursing note reviewed.  Constitutional:      General: She is not in acute distress.    Appearance: She is  well-developed.  HENT:     Head: Normocephalic and atraumatic.     Right Ear: External ear normal.     Left Ear: External ear normal.     Nose: Nose normal.  Eyes:     General:        Right eye: No discharge.        Left eye: No discharge.  Neck:     Musculoskeletal: Muscular tenderness present. No neck rigidity.   Cardiovascular:     Rate and Rhythm: Normal rate and regular rhythm.     Pulses:          Radial pulses are 2+ on the right side and 2+ on the left side.     Heart sounds: Normal heart sounds.  Pulmonary:     Effort: Pulmonary effort is normal.     Breath sounds: Normal breath sounds.  Chest:     Chest wall: No tenderness.  Abdominal:     Palpations: Abdomen is soft.     Tenderness: There is no abdominal tenderness.  Musculoskeletal:     Right shoulder: She exhibits tenderness. She exhibits no deformity.     Cervical back: She exhibits tenderness.       Back:  Skin:    General: Skin is warm and dry.  Neurological:     Mental Status: She is alert.     Comments: CN 3-12 grossly intact. 5/5 strength in all 4 extremities. Painful to test the RUE, but seems to have intact strength Grossly normal sensation. Normal finger to nose.   Psychiatric:        Mood and Affect: Mood is not anxious.      ED Treatments / Results  Labs (all labs ordered are listed, but only abnormal results are displayed) Labs  Reviewed  COMPREHENSIVE METABOLIC PANEL - Abnormal; Notable for the following components:      Result Value   Glucose, Bld 121 (*)    All other components within normal limits  CBC WITH DIFFERENTIAL/PLATELET  TROPONIN I (HIGH SENSITIVITY)  TROPONIN I (HIGH SENSITIVITY)    EKG EKG Interpretation  Date/Time:  Monday June 05 2019 07:58:20 EDT Ventricular Rate:  51 PR Interval:    QRS Duration: 91 QT Interval:  436 QTC Calculation: 402 R Axis:   26 Text Interpretation:  Sinus rhythm Atrial premature complex Abnormal R-wave progression, early transition  Borderline T abnormalities, anterior leads T wave changes similar to Sept 14 2020 Confirmed by Sherwood Gambler [phone]) on 06/05/2019 8:16:32 AM   Radiology Dg Chest 2 View  Result Date: 06/05/2019 CLINICAL DATA:  Right-sided chest pain EXAM: CHEST - 2 VIEW COMPARISON:  11/12/2018 FINDINGS: Artifact overlies the chest. Previous median sternotomy and CABG. Chronic elevation of the right hemidiaphragm. The pulmonary vascularity is normal. No effusions. No acute bone finding. IMPRESSION: Previous CABG. Chronic elevation of the right hemidiaphragm. No acute finding. Electronically Signed   By: Nelson Chimes M.D.   On: 06/05/2019 08:45   Dg Shoulder Right  Result Date: 06/05/2019 CLINICAL DATA:  Right shoulder and chest pain upon wakening. EXAM: RIGHT SHOULDER - 2+ VIEW COMPARISON:  None. FINDINGS: There is osteoarthritis of the glenohumeral joint with marginal osteophytes. There is some narrowing of the humeral acromial distance suggesting rotator cuff disease. There are subacromial spurs which could predispose to that. There is mild degenerative change of the [facility] joint. Surgical clips are present in the axillary region. IMPRESSION: Mild bony degenerative changes of the shoulder. Findings possibly suggesting rotator cuff disease as well. Electronically Signed   By: Nelson Chimes M.D.   On: 06/05/2019 08:44   Ct Cervical Spine Wo Contrast  Result Date: 06/05/2019 CLINICAL DATA:  Radiculopathy.  Right-sided pain. EXAM: CT CERVICAL SPINE WITHOUT CONTRAST TECHNIQUE: Multidetector CT imaging of the cervical spine was performed without intravenous contrast. Multiplanar CT image reconstructions were also generated. COMPARISON:  Cervical spine radiographs 09/22/2015. CT of the cervical spine 03/02/2015. FINDINGS: Alignment: There is straightening of the normal cervical lordosis. Mild rightward curvature is present. Skull base and vertebrae: Craniocervical junction is normal. No acute or healing fractures are present.  Soft tissues and spinal canal: Dense atherosclerotic changes are present at the carotid bifurcations bilaterally. Disc levels: Uncovertebral spurring is greatest at C5-6 and C6-7. Foraminal narrowing is worse left than right. A central disc protrusion is present at C3-4. Upper chest: The lung apices are clear. Thoracic inlet is within normal limits. Atherosclerotic changes are noted at the aortic arch. IMPRESSION: 1. No acute abnormality. 2. Uncovertebral disease and foraminal stenosis is greatest at C5-6 and C6-7, left greater than right. 3. Atherosclerotic changes noted at the aortic arch and carotid bifurcations. Aortic Atherosclerosis (ICD10-I70.0). Electronically Signed   By: San Morelle M.D.   On: 06/05/2019 09:14    Procedures Procedures (including critical care time)  Medications Ordered in ED Medications  sodium chloride 0.9 % bolus 1,000 mL (1,000 mLs Intravenous New Bag/Given 06/05/19 0812)  HYDROmorphone (DILAUDID) injection 1 mg (1 mg Intravenous Given 06/05/19 0811)     Initial Impression / Assessment and Plan / ED Course  I have reviewed the triage vital signs and the nursing notes.  Pertinent labs & imaging results that were available during my care of the patient were reviewed by me and considered in my medical decision making (see  chart for details).        Presentation appears consistent with cervical radiculopathy.  After IV Dilaudid her pain is better and she has freer movement of her arm.  My suspicion that this is ACS is lower though since she has some atypical right-sided chest pain a troponin and ECG were obtained.  These are negative.  Given several hours of symptoms acutely but also this has been ongoing for about a week, I think that ACS is very unlikely given these negative findings.  Doubt dissection.  She has diabetes but has pretty well controlled glucose and after discussion of risk/benefit, will start prednisone taper to see if this helps with her  radicular symptoms.  No signs that this is a neurologic emergency.  Discussed return precautions and outpatient follow-up.  Final Clinical Impressions(s) / ED Diagnoses   Final diagnoses:  Cervical radiculopathy    ED Discharge Orders         Ordered    predniSONE (DELTASONE) 20 MG tablet     06/05/19 1046           Sherwood Gambler, MD 06/05/19 1054

## 2019-06-05 NOTE — ED Notes (Signed)
Patient verbalizes understanding of discharge instructions. Opportunity for questioning and answers were provided. Armband removed by staff, pt discharged from ED.  

## 2019-06-05 NOTE — Discharge Instructions (Addendum)
If you develop worsening, recurrent, or continued neck/back pain, numbness or weakness in the arms or legs, incontinence of your bowels or bladders, numbness of your buttocks, fever, abdominal pain, or any other new/concerning symptoms then return to the ER for evaluation.   You are being given prednisone today which is a steroid.  This can hopefully help with your radiculopathy but can also make your sugars/glucose go up.  Be sure to monitor this closely and follow-up with your doctor.

## 2019-06-05 NOTE — ED Triage Notes (Addendum)
Report from [facility]> pt woke up with R sided shoulder pain that radiates down R arm.  BP 172/102.  States she took her BP medication yesterday and BP is usually not that high.  Pain worse with movement.

## 2019-06-22 ENCOUNTER — Encounter: Payer: Self-pay | Admitting: Physician Assistant

## 2019-07-06 ENCOUNTER — Encounter: Payer: Self-pay | Admitting: Physician Assistant

## 2019-07-06 ENCOUNTER — Other Ambulatory Visit: Payer: Self-pay

## 2019-07-06 ENCOUNTER — Ambulatory Visit (INDEPENDENT_AMBULATORY_CARE_PROVIDER_SITE_OTHER): Payer: Medicare Other | Admitting: Physician Assistant

## 2019-07-06 VITALS — BP 114/68 | HR 54 | Temp 98.5°F | Ht <= 58 in | Wt 210.8 lb

## 2019-07-06 DIAGNOSIS — K5900 Constipation, unspecified: Secondary | ICD-10-CM

## 2019-07-06 DIAGNOSIS — Z86010 Personal history of colonic polyps: Secondary | ICD-10-CM | POA: Diagnosis not present

## 2019-07-06 DIAGNOSIS — Z1159 Encounter for screening for other viral diseases: Secondary | ICD-10-CM | POA: Diagnosis not present

## 2019-07-06 DIAGNOSIS — R195 Other fecal abnormalities: Secondary | ICD-10-CM | POA: Diagnosis not present

## 2019-07-06 MED ORDER — PEG 3350-KCL-NA BICARB-NACL 420 G PO SOLR: 4000.0000 mL | Freq: Once | ORAL | 0 refills | Status: AC

## 2019-07-06 NOTE — Progress Notes (Signed)
I agree with the abobve note, plan

## 2019-07-06 NOTE — Patient Instructions (Addendum)
HOLD Metformin/glipizide the morning of your test. ____________________________________________________________  Due to recent COVID-19 restrictions implemented by [facility] local and state authorities and in an effort to keep both patients and staff as safe as possible, [facility] requires COVID-19 testing prior to any scheduled endoscopic procedure. The testing center is located at [address] [place], [place] [address] in the [facility] [employer]  suite.  Your appointment has been scheduled for 11:20 am on Friday 07/21/19.   Please bring your insurance cards to this appointment. You will require your COVID screen 2 business days prior to your endoscopic procedure.  You are not required to quarantine after your screening.  You will only receive a phone call with the results if it is POSITIVE.  If you do not receive a call the day before your procedure you should begin your prep, if ordered, and you should report to the endo center for your procedure at your designated appointment arrival time ( one hour prior to the procedure time). There is no cost to you for the screening on the day of the swab.  [facility] will file with your insurance company for the testing.    You may receive an automated phone call prior to your procedure or have a message in your MyChart that you have an appointment for a BP/15 at the [facility], please disregard this message.  Your testing will be at the [address] [place] location.   If you are leaving [facility] travel [place] on [address], turn left onto [address], turn night onto [address] at the 1st stop light turn right, pass the [employer] on your right and proceed to [address] (white building).  ____________________________________________________________ Dennis Bast have been scheduled for a colonoscopy.  Please follow written instructions given to you at your visit today.  Please pick up your prep supplies at the pharmacy within the next 1-3 days. If you use inhalers (even only as needed), please bring them with you on the day of your procedure. Your physician has requested that you go to www.startemmi.com and enter the access code given to you at your visit today. This web site gives a general overview about your procedure. However, you should still follow specific instructions given to you by our office regarding your preparation for the procedure. __________________________________________________________ Please purchase the following medications over the counter and take as directed: Miralax 17 grams (1 capful) daily. __________________________________________________________ If you are age 44 or older, your body mass index should be between 23-30. Your Body mass index is 44.83 kg/m. If this is out of the aforementioned range listed, please consider follow up with your Primary Care Provider.  If you are age 94 or younger, your body mass index should be between 19-25. Your Body mass index is 44.83 kg/m. If this is out of the aformentioned range listed, please consider follow up with your Primary Care Provider.  _________________________________________________________ Please follow up with Dr Ardis Hughs as per his recommendations after colonoscopy.

## 2019-07-06 NOTE — Progress Notes (Signed)
Chief Complaint: History of adenomatous polyps and melena  HPI:     Patricia Cuevas is a 68 year old African-American female with a past medical history of CAD (12/20/2015 cardiac cath LVEF noted as normal", CHF, alcohol abuse and others listed below, known to Dr. Ardis Hughs, who was referred to me by [name], MD for a complaint of adenomatous polyps and melena.      05/23/2014 colonoscopy for history of adenomatous colon polyps in 2011, 1 polyp found.  Pathology showed tubular adenoma repeat was recommended in 5 years.    06/05/2019 CBC and CMP were normal.    06/22/2019 seen by PCP for follow-up of chronic complaints.  At that time described melena.  She was also referred to [place] for history of polyps.    Today, the patient tells me that she feels well other than her arthritis.  Explains that she has had a couple episodes of constipation, one which sent her to the ER.  She was given a MiraLAX prep and when she did this she had a lot of bowel movement and some of it was "very dark".  She tells me "it was not black".  After that she did well for a couple of weeks and over the past week has started to become constipated again.  She went and bought some more MiraLAX just in case she needs it.    Denies fever, chills, weight loss, nausea, vomiting, abdominal pain, heartburn or reflux.  Past Medical History:  Diagnosis Date  . Alcohol abuse    in the past, resolved  . Arthritis   . Asthma   . Blood transfusion without reported diagnosis 1984   after surgery  . Breast cancer [facility])    Double mastectomy 2007; s/p tamoxifen and arimidex therapy; no recurrence since 05/2008  . CAD (coronary artery disease)    Nuclear stress test Feb 11, 2011, EF 64%, no scar or ischemia    //         catheterization, November, 2011,patent LIMA-LAD-small caliber distal vessel with diffuse plaque, patent SVG to OM1 and OM 2, patent SVG to PDA and PLA, occluded SVG to diagonal, medical therapy;  Lexiscan Myoview 6/14:  Inferior  thinning, no ischemia, EF 61%, low risk  . CHF (congestive heart failure) ([place])   . Chronic back pain   . Diabetes mellitus   . Dyslipidemia   . Ejection fraction    EF 65%, echo, [place], Jan 16, 2011  . Hx of CABG    2007  . Hx of colonic polyps   . Hyperlipidemia   . Hypertension   . Myocardial infarction ([place])   . Nausea & vomiting    hospitalization, November, 2011, stricture GE junction, questionable stenosis gastrojejunostomy, disruptive primary peristaltic wave, GE reflux, delayed emptying proximal gastric pouch  . Normal cardiac stress test 02/2013  . Overweight(278.02)    stomach stapling 1985 followed by weight loss, then return of weight  . Sciatica   . Sleep apnea    CPAP being arranged May, 2011//does not use CPAP  . Tobacco abuse    in the past, resolved  . UTI (urinary tract infection)     Past Surgical History:  Procedure Laterality Date  . ABDOMINAL HYSTERECTOMY  1989  . CARDIAC CATHETERIZATION N/A 12/20/2015   Procedure: Left Heart Cath and Cors/Grafts Angiography;  Surgeon: Leonie Man, MD;  Location: [facility];  Service: Cardiovascular;  Laterality: N/A;  . CORONARY ARTERY BYPASS GRAFT  2009  .  CORONARY STENT PLACEMENT    . GASTRIC BYPASS  1983   open.  [place]  . MASTECTOMY Bilateral 1987    Current Outpatient Medications  Medication Sig Dispense Refill  . acetaminophen (TYLENOL) 500 MG tablet Take 1 tablet (500 mg total) every 6 (six) hours as needed by mouth. (Patient not taking: Reported on 11/12/2018) 30 tablet 0  . albuterol (PROVENTIL HFA;VENTOLIN HFA) 108 (90 BASE) MCG/ACT inhaler Inhale 2 puffs into the lungs every 6 (six) hours as needed for shortness of breath.     [name] Kitchen aspirin EC 81 MG tablet Take 81 mg by mouth 2 (two) times daily.     . cetirizine (ZYRTEC) 10 MG tablet Take 1 tablet (10 mg total) by mouth daily. 30 tablet 0  . cholecalciferol (VITAMIN D3) 25 MCG (1000 UT) tablet Take 1,000 Units by mouth daily.    .  fluticasone (FLONASE) 50 MCG/ACT nasal spray Place 2 sprays into both nostrils daily. 16 g 0  . glipiZIDE (GLUCOTROL XL) 10 MG 24 hr tablet Take 1 tablet (10 mg total) by mouth daily with breakfast. 30 tablet 9  . HYDROcodone-acetaminophen (NORCO) 10-325 MG tablet Take 1 tablet by mouth 2 (two) times daily as needed for moderate pain.    [name] Kitchen latanoprost (XALATAN) 0.005 % ophthalmic solution Place 1 drop into both eyes at bedtime.    [name] Kitchen lisinopril-hydrochlorothiazide (PRINZIDE,ZESTORETIC) 20-12.5 MG per tablet Take 1 tablet by mouth daily.      . metoprolol tartrate (LOPRESSOR) 25 MG tablet TAKE 1 TABLET BY MOUTH 2 TIMES A DAY 180 tablet 0  . naproxen (NAPROSYN) 375 MG tablet Take 1 tablet (375 mg total) by mouth 2 (two) times daily. (Patient taking differently: Take 375 mg by mouth 2 (two) times daily as needed (pain). ) 30 tablet 0  . nitroGLYCERIN (NITROSTAT) 0.4 MG SL tablet Place 1 tablet (0.4 mg total) under the tongue every 5 (five) minutes as needed for chest pain. 25 tablet 3  . predniSONE (DELTASONE) 20 MG tablet 3 tabs po daily x 3 days, then 2 tabs x 3 days, then 1.5 tabs x 3 days, then 1 tab x 3 days, then 0.5 tabs x 3 days 27 tablet 0  . rosuvastatin (CRESTOR) 10 MG tablet Take 1 tablet (10 mg total) by mouth at bedtime. 90 tablet 3   No current facility-administered medications for this visit.     Allergies as of 07/06/2019 - Review Complete 05/29/2019  Allergen Reaction Noted  . Latex Itching and Rash 03/02/2016    Family History  Problem Relation Age of Onset  . Hypertension Mother   . Heart disease Mother   . Diabetes Mother   . Heart disease Father   . Colon cancer Father        does not know age of onset  . Heart disease Sister   . Colon cancer Sister 1  . Stomach cancer Sister   . Asthma Sister   . Esophageal cancer Neg Hx   . Rectal cancer Neg Hx     Social History   Socioeconomic History  . Marital status: Single    Spouse name: Not on file  . Number of  children: Not on file  . Years of education: Not on file  . Highest education level: Not on file  Occupational History  . Occupation: Disabled  Social Needs  . Financial resource strain: Not on file  . Food insecurity    Worry: Not on file    Inability: Not  on file  . Transportation needs    Medical: Not on file    Non-medical: Not on file  Tobacco Use  . Smoking status: Former Smoker    Packs/day: 3.00    Years: 27.00    Pack years: 81.00    Types: Cigarettes    Quit date: 09/14/2000    Years since quitting: 18.8  . Smokeless tobacco: Never Used  Substance and Sexual Activity  . Alcohol use: No  . Drug use: No  . Sexual activity: Not on file  Lifestyle  . Physical activity    Days per week: Not on file    Minutes per session: Not on file  . Stress: Not on file  Relationships  . Social Herbalist on phone: Not on file    Gets together: Not on file    Attends religious service: Not on file    Active member of club or organization: Not on file    Attends meetings of clubs or organizations: Not on file    Relationship status: Not on file  . Intimate partner violence    Fear of current or ex partner: Not on file    Emotionally abused: Not on file    Physically abused: Not on file    Forced sexual activity: Not on file  Other Topics Concern  . Not on file  Social History Narrative  . Not on file    Review of Systems:    Constitutional: No weight loss, fever or chills Skin: No rash  Cardiovascular: No chest pain  Respiratory: No SOB Gastrointestinal: See HPI and otherwise negative Genitourinary: No dysuria  Neurological: No headache, dizziness or syncope Musculoskeletal: No new muscle or joint pain Hematologic: No bleeding  Psychiatric: No history of depression or anxiety   Physical Exam:  Vital signs:  BP 114/68   Pulse (!) 54   Temp 98.5 F (36.9 C)   Ht 4' 9.5" (1.461 m)   Wt 210 lb 12.8 oz (95.6 kg)   BMI 44.83 kg/m   Constitutional:    Pleasant overweight AA female appears to be in NAD, Well developed, Well nourished, alert and cooperative Head:  Normocephalic and atraumatic. Eyes:   PEERL, EOMI. No icterus. Conjunctiva pink. Ears:  Normal auditory acuity. Neck:  Supple Throat: Oral cavity and pharynx without inflammation, swelling or lesion.  Respiratory: Respirations even and unlabored. Lungs clear to auscultation bilaterally.   No wheezes, crackles, or rhonchi.  Cardiovascular: Normal S1, S2. No MRG. Regular rate and rhythm. No peripheral edema, cyanosis or pallor.  Gastrointestinal:  Soft, nondistended, nontender. No rebound or guarding. Normal bowel sounds. No appreciable masses or hepatomegaly. Rectal:  Not performed.  Msk:  Symmetrical without gross deformities. Without edema, no deformity or joint abnormality.  Neurologic:  Alert and  oriented x4;  grossly normal neurologically.  Skin:   Dry and intact without significant lesions or rashes. Psychiatric:  Demonstrates good judgement and reason without abnormal affect or behaviors.  MOST RECENT LABS AND IMAGING: CBC    Component Value Date/Time   WBC 5.6 06/05/2019 0748   RBC 4.70 06/05/2019 0748   HGB 13.7 06/05/2019 0748   HGB 13.3 12/03/2011 1043   HCT 41.7 06/05/2019 0748   HCT 39.9 12/03/2011 1043   PLT 185 06/05/2019 0748   PLT 221 12/03/2011 1043   MCV 88.7 06/05/2019 0748   MCV 86.2 12/03/2011 1043   MCH 29.1 06/05/2019 0748   MCHC 32.9 06/05/2019 0748   RDW 14.4  06/05/2019 0748   RDW 16.3 (H) 12/03/2011 1043   LYMPHSABS 2.9 06/05/2019 0748   LYMPHSABS 3.6 (H) 12/03/2011 1043   MONOABS 0.7 06/05/2019 0748   MONOABS 0.7 12/03/2011 1043   EOSABS 0.1 06/05/2019 0748   EOSABS 0.1 12/03/2011 1043   BASOSABS 0.0 06/05/2019 0748   BASOSABS 0.1 12/03/2011 1043    CMP     Component Value Date/Time   NA 139 06/05/2019 0748   K 4.0 06/05/2019 0748   CL 103 06/05/2019 0748   CO2 26 06/05/2019 0748   GLUCOSE 121 (H) 06/05/2019 0748   BUN 8  06/05/2019 0748   CREATININE 0.88 06/05/2019 0748   CREATININE 0.69 12/17/2015 1157   CALCIUM 9.4 06/05/2019 0748   PROT 6.9 06/05/2019 0748   ALBUMIN 3.6 06/05/2019 0748   AST 22 06/05/2019 0748   ALT 16 06/05/2019 0748   ALKPHOS 60 06/05/2019 0748   BILITOT 0.8 06/05/2019 0748   GFRNONAA >60 06/05/2019 0748   GFRAA >60 06/05/2019 0748    Assessment: 1.  History of adenomatous polyps: Last colonoscopy 5 years ago, repeat was recommended in 5 years 2.  Dark stool: This was related to constipation, not black per the patient, recent hemoglobin 9/21 was normal, no epigastric complaints; most likely this is related to constipation and not an upper GI bleed 3. Constipation  Plan: 1.  Scheduled patient for a colonoscopy in the [place] with Dr. Ardis Hughs.  Did discuss risks, benefits, limitations and alternatives and patient agrees to proceed. 2.  Recommend the patient start MiraLAX on a daily basis. 3.  Patient to follow in clinic per recommendations from Dr. Ardis Hughs after time of procedure.  Patricia Newer, PA-C [facility] 07/06/2019, 1:55 PM  Cc: [name], MD

## 2019-07-11 ENCOUNTER — Other Ambulatory Visit: Payer: Self-pay | Admitting: Cardiology

## 2019-07-11 DIAGNOSIS — R0789 Other chest pain: Secondary | ICD-10-CM

## 2019-07-21 ENCOUNTER — Other Ambulatory Visit: Payer: Self-pay | Admitting: Gastroenterology

## 2019-07-21 LAB — SARS CORONAVIRUS 2 (TAT 6-24 HRS): SARS Coronavirus 2: NEGATIVE

## 2019-07-25 ENCOUNTER — Other Ambulatory Visit: Payer: Self-pay

## 2019-07-25 ENCOUNTER — Ambulatory Visit (AMBULATORY_SURGERY_CENTER): Payer: Medicare Other | Admitting: Gastroenterology

## 2019-07-25 ENCOUNTER — Encounter: Payer: Self-pay | Admitting: Gastroenterology

## 2019-07-25 VITALS — BP 183/96 | HR 70 | Temp 98.3°F | Resp 18 | Ht <= 58 in | Wt 210.0 lb

## 2019-07-25 DIAGNOSIS — Z86010 Personal history of colonic polyps: Secondary | ICD-10-CM

## 2019-07-25 MED ORDER — SODIUM CHLORIDE 0.9 % IV SOLN: 500.0000 mL | Freq: Once | INTRAVENOUS | Status: DC

## 2019-07-25 NOTE — Progress Notes (Signed)
Report given to PACU, vss 

## 2019-07-25 NOTE — Patient Instructions (Signed)
Handout given for hemorrhoids.  YOU HAD AN ENDOSCOPIC PROCEDURE TODAY AT THE [facility]:   Refer to the procedure report that was given to you for any specific questions about what was found during the examination.  If the procedure report does not answer your questions, please call your gastroenterologist to clarify.  If you requested that your care partner not be given the details of your procedure findings, then the procedure report has been included in a sealed envelope for you to review at your convenience later.  YOU SHOULD EXPECT: Some feelings of bloating in the abdomen. Passage of more gas than usual.  Walking can help get rid of the air that was put into your GI tract during the procedure and reduce the bloating. If you had a lower endoscopy (such as a colonoscopy or flexible sigmoidoscopy) you may notice spotting of blood in your stool or on the toilet paper. If you underwent a bowel prep for your procedure, you may not have a normal bowel movement for a few days.  Please Note:  You might notice some irritation and congestion in your nose or some drainage.  This is from the oxygen used during your procedure.  There is no need for concern and it should clear up in a day or so.  SYMPTOMS TO REPORT IMMEDIATELY:   Following lower endoscopy (colonoscopy or flexible sigmoidoscopy):  Excessive amounts of blood in the stool  Significant tenderness or worsening of abdominal pains  Swelling of the abdomen that is new, acute  Fever of 100F or higher   For urgent or emergent issues, a gastroenterologist can be reached at any hour by calling [phone].   DIET:  We do recommend a small meal at first, but then you may proceed to your regular diet.  Drink plenty of fluids but you should avoid alcoholic beverages for 24 hours.  ACTIVITY:  You should plan to take it easy for the rest of today and you should NOT DRIVE or use heavy machinery until tomorrow (because of the sedation  medicines used during the test).    FOLLOW UP: Our staff will call the number listed on your records 48-72 hours following your procedure to check on you and address any questions or concerns that you may have regarding the information given to you following your procedure. If we do not reach you, we will leave a message.  We will attempt to reach you two times.  During this call, we will ask if you have developed any symptoms of COVID 19. If you develop any symptoms (ie: fever, flu-like symptoms, shortness of breath, cough etc.) before then, please call [phone].  If you test positive for Covid 19 in the 2 weeks post procedure, please call and report this information to us.    If any biopsies were taken you will be contacted by phone or by letter within the next 1-3 weeks.  Please call us at [phone] if you have not heard about the biopsies in 3 weeks.    SIGNATURES/CONFIDENTIALITY: You and/or your care partner have signed paperwork which will be entered into your electronic medical record.  These signatures attest to the fact that that the information above on your After Visit Summary has been reviewed and is understood.  Full responsibility of the confidentiality of this discharge information lies with you and/or your care-partner. 

## 2019-07-25 NOTE — Progress Notes (Signed)
Temp- CB Vitals- CW

## 2019-07-25 NOTE — Op Note (Signed)
[facility] Patient Name: Patricia Cuevas Procedure Date: 07/25/2019 10:20 AM MRN: [id number] Endoscopist: Milus Banister , MD Age: 68 Referring MD:  Date of Birth: Jan 25, 1951 Gender: Female Account #: [id number] Procedure:                Colonoscopy Indications:              High risk colon cancer surveillance: Personal                            history of colonic polyps; Adenomatous colon                            polyps: Colonoscopy Dr. Ardis Hughs 11/2008; 2 small TAs                            removed, +hems, she was recommended to have repeat                            colonoscopy at 5 year interval. Colonoscopy Dr.                            Ferdinand Lango, 10/2009: 2 33mm polyps removed; one was                            serrated adenoma, the other HP; recommended to have                            repeat in 1 year due to poor prep. Colonoscopy Dr.                            Ardis Hughs 2015 found single subCM adenoma Medicines:                Monitored Anesthesia Care Procedure:                Pre-Anesthesia Assessment:                           - Prior to the procedure, a History and Physical                            was performed, and patient medications and                            allergies were reviewed. The patient's tolerance of                            previous anesthesia was also reviewed. The risks                            and benefits of the procedure and the sedation                            options and risks were discussed with the patient.  All questions were answered, and informed consent                            was obtained. Prior Anticoagulants: The patient has                            taken no previous anticoagulant or antiplatelet                            agents. ASA Grade Assessment: II - A patient with                            mild systemic disease. After reviewing the risks                            and benefits, the  patient was deemed in                            satisfactory condition to undergo the procedure.                           After obtaining informed consent, the colonoscope                            was passed under direct vision. Throughout the                            procedure, the patient's blood pressure, pulse, and                            oxygen saturations were monitored continuously. The                            Colonoscope was introduced through the anus and                            advanced to the the cecum, identified by                            appendiceal orifice and ileocecal valve. The                            colonoscopy was performed without difficulty. The                            patient tolerated the procedure well. The quality                            of the bowel preparation was good. The ileocecal                            valve, appendiceal orifice, and rectum were  photographed. Scope In: 11:06:02 AM Scope Out: 11:20:18 AM Scope Withdrawal Time: 0 hours 9 minutes 58 seconds  Total Procedure Duration: 0 hours 14 minutes 16 seconds  Findings:                 External and internal hemorrhoids were found. The                            hemorrhoids were small.                           The exam was otherwise without abnormality on                            direct and retroflexion views. Complications:            No immediate complications. Estimated blood loss:                            None. Estimated Blood Loss:     Estimated blood loss: none. Impression:               - External and internal hemorrhoids.                           - The examination was otherwise normal on direct                            and retroflexion views.                           - No polyps or cancers. Recommendation:           - Patient has a contact number available for                            emergencies. The signs and symptoms of  potential                            delayed complications were discussed with the                            patient. Return to normal activities tomorrow.                            Written discharge instructions were provided to the                            patient.                           - Resume previous diet.                           - Continue present medications.                           - Repeat colonoscopy in 10 years for screening. Milus Banister, MD 07/25/2019  11:24:39 AM This report has been signed electronically.

## 2019-07-27 ENCOUNTER — Telehealth: Payer: Self-pay

## 2019-07-27 NOTE — Telephone Encounter (Signed)
  Follow up Call-  Call back number 07/25/2019  Post procedure Call Back phone  # [phone]  Permission to leave phone message Yes  Some recent data might be hidden     Patient questions:  Do you have a fever, pain , or abdominal swelling? No. Pain Score  0 *  Have you tolerated food without any problems? Yes.    Have you been able to return to your normal activities? Yes.    Do you have any questions about your discharge instructions: Diet   No. Medications  No. Follow up visit  No.  Do you have questions or concerns about your Care? No.  Actions: * If pain score is 4 or above: No action needed, pain <4.  1. Have you developed a fever since your procedure? no  2.   Have you had an respiratory symptoms (SOB or cough) since your procedure? no  3.   Have you tested positive for COVID 19 since your procedure no  4.   Have you had any family members/close contacts diagnosed with the COVID 19 since your procedure?  no   If yes to any of these questions please route to Joylene John, RN and Alphonsa Gin, Therapist, sports.

## 2019-08-23 ENCOUNTER — Other Ambulatory Visit: Payer: Self-pay

## 2019-08-23 ENCOUNTER — Encounter: Payer: Self-pay | Admitting: Cardiology

## 2019-08-23 ENCOUNTER — Ambulatory Visit (INDEPENDENT_AMBULATORY_CARE_PROVIDER_SITE_OTHER): Payer: Medicare Other | Admitting: Cardiology

## 2019-08-23 VITALS — BP 120/62 | HR 85 | Ht <= 58 in | Wt 213.2 lb

## 2019-08-23 DIAGNOSIS — I209 Angina pectoris, unspecified: Secondary | ICD-10-CM

## 2019-08-23 DIAGNOSIS — I2510 Atherosclerotic heart disease of native coronary artery without angina pectoris: Secondary | ICD-10-CM

## 2019-08-23 DIAGNOSIS — E785 Hyperlipidemia, unspecified: Secondary | ICD-10-CM

## 2019-08-23 DIAGNOSIS — I2583 Coronary atherosclerosis due to lipid rich plaque: Secondary | ICD-10-CM | POA: Diagnosis not present

## 2019-08-23 NOTE — Progress Notes (Signed)
Cardiology Office Note   Date:  08/23/2019   ID:  Patricia Cuevas, DOB 1951/03/01, MRN [id number]  PCP:  [name], MD  Cardiologist:   Candee Furbish, MD     History of Present Illness: Patricia Cuevas is a 68 y.o. female former patient of Dr. Ron Parker here for follow-up appointment.  Has coronary disease post bypass surgery in 2007 with catheterization in 2011 and 2017 demonstrating patent LIMA to LAD, small caliber vessel distally diffuse plaque with patent SVG to OM1 and OM 2, patent SVG to PDA and PLA, occluded SVG to diagonal. Last cath in 2017 secondary to abnormal NUC stress.   She seeks the majority of her care in the emergency department.  Went to ER 01/29/2016 - abdominal pain, ruq [place] normal.   Multiple episodes of atypical chest pain. No CP, no SOB, no edema.   Compliant with meds.  Normal EF.  01/15/17-she was in the emergency room on 11/16/16 and 10/06/16 with nonspecific chest pain and right shoulder pain. Reassuring workup. Last catheterization 12/20/15. See below. Fell and problems with right shoulder, pain in neck as well.  01/21/18 -having some more SOB with walking, mild pressure. A few months. Sometimes at rest.  Reviewed her prior cardiac catheterization that was reassuring.  2017.  08/23/2018-chief complaint coronary artery disease follow-up.  Still has some shortness of breath.  Once again reviewed that her prior heart cath in 2017 was reassuring.  Medical therapy.  Overall though she is feeling quite well.  She is not having any chest discomfort.  We will test the waters with her being off of the isosorbide.  Once again, her maximum weight was 525 pounds, she is post gastric bypass.  We will check cholesterol.  Taking all of her medicine she states.  Denies any fevers chills nausea vomiting syncope bleeding.  08/23/2019-here for the follow-up of coronary artery disease hypertension.  Prior CABG. denies any fevers chills nausea vomiting syncope bleeding.  She has been  doing quite well.  Stressful that she could not have Thanksgiving at her house but she knew with Covid it was reasonable.  No chest pain.  Past Medical History:  Diagnosis Date  . Alcohol abuse    in the past, resolved  . Arthritis   . Asthma   . Blood transfusion without reported diagnosis 1984   after surgery  . Breast cancer [facility])    Double mastectomy 2007; s/p tamoxifen and arimidex therapy; no recurrence since 05/2008  . CAD (coronary artery disease)    Nuclear stress test Feb 11, 2011, EF 64%, no scar or ischemia    //         catheterization, November, 2011,patent LIMA-LAD-small caliber distal vessel with diffuse plaque, patent SVG to OM1 and OM 2, patent SVG to PDA and PLA, occluded SVG to diagonal, medical therapy;  Lexiscan Myoview 6/14:  Inferior thinning, no ischemia, EF 61%, low risk  . CHF (congestive heart failure) ([place])   . Chronic back pain   . Diabetes mellitus   . Dyslipidemia   . Ejection fraction    EF 65%, echo, [place], Jan 16, 2011  . Hx of CABG    2007  . Hx of colonic polyps   . Hyperlipidemia   . Hypertension   . Myocardial infarction ([place])   . Nausea & vomiting    hospitalization, November, 2011, stricture GE junction, questionable stenosis gastrojejunostomy, disruptive primary peristaltic wave, GE reflux, delayed emptying proximal gastric pouch  .  Normal cardiac stress test 02/2013  . Overweight(278.02)    stomach stapling 1985 followed by weight loss, then return of weight  . Sciatica   . Sleep apnea    CPAP being arranged May, 2011//does not use CPAP  . Tobacco abuse    in the past, resolved  . UTI (urinary tract infection)     Past Surgical History:  Procedure Laterality Date  . ABDOMINAL HYSTERECTOMY  1989  . CARDIAC CATHETERIZATION N/A 12/20/2015   Procedure: Left Heart Cath and Cors/Grafts Angiography;  Surgeon: Leonie Man, MD;  Location: [place] CV LAB;  Service: Cardiovascular;  Laterality: N/A;  . CORONARY ARTERY BYPASS GRAFT   2009  . CORONARY STENT PLACEMENT    . GASTRIC BYPASS  1983   open.  [place]  . MASTECTOMY Bilateral 1987     Current Outpatient Medications  Medication Sig Dispense Refill  . acetaminophen (TYLENOL) 500 MG tablet Take 1 tablet (500 mg total) every 6 (six) hours as needed by mouth. 30 tablet 0  . albuterol (PROVENTIL HFA;VENTOLIN HFA) 108 (90 BASE) MCG/ACT inhaler Inhale 2 puffs into the lungs every 6 (six) hours as needed for shortness of breath.     [name] Kitchen aspirin EC 81 MG tablet Take 81 mg by mouth daily.     . cetirizine (ZYRTEC) 10 MG tablet Take 1 tablet (10 mg total) by mouth daily. 30 tablet 0  . cholecalciferol (VITAMIN D3) 25 MCG (1000 UT) tablet Take 1,000 Units by mouth daily.    . fluticasone (FLONASE) 50 MCG/ACT nasal spray Place 2 sprays into both nostrils daily. 16 g 0  . glipiZIDE (GLUCOTROL XL) 10 MG 24 hr tablet Take 1 tablet (10 mg total) by mouth daily with breakfast. 30 tablet 9  . HYDROcodone-acetaminophen (NORCO) 10-325 MG tablet Take 1 tablet by mouth 2 (two) times daily as needed for moderate pain.    [name] Kitchen latanoprost (XALATAN) 0.005 % ophthalmic solution Place 1 drop into both eyes at bedtime.    [name] Kitchen lisinopril-hydrochlorothiazide (PRINZIDE,ZESTORETIC) 20-12.5 MG per tablet Take 1 tablet by mouth daily.      . metoprolol tartrate (LOPRESSOR) 25 MG tablet TAKE 1 TABLET BY MOUTH 2 TIMES A DAY 180 tablet 0  . montelukast (SINGULAIR) 10 MG tablet Take 10 mg by mouth daily.    . naproxen (NAPROSYN) 375 MG tablet Take 1 tablet (375 mg total) by mouth 2 (two) times daily. 30 tablet 0  . nitroGLYCERIN (NITROSTAT) 0.4 MG SL tablet Place 1 tablet (0.4 mg total) under the tongue every 5 (five) minutes as needed for chest pain. 25 tablet 3  . rosuvastatin (CRESTOR) 10 MG tablet Take 1 tablet (10 mg total) by mouth at bedtime. 90 tablet 3   No current facility-administered medications for this visit.     Allergies:   Latex    Social History:  The patient  reports that she quit  smoking about 18 years ago. Her smoking use included cigarettes. She has a 81.00 pack-year smoking history. She has never used smokeless tobacco. She reports that she does not drink alcohol or use drugs.   Family History:  The patient's family history includes Asthma in her sister; Colon cancer in her father; Colon cancer (age of onset: 29) in her sister; Diabetes in her mother, sister, and sister; Heart disease in her father, mother, and sister; Hypertension in her mother; Stomach cancer in her sister.    ROS:  Please see the history of present illness.   All other  review of systems negative.  PHYSICAL EXAM: VS:  BP 120/62   Pulse 85   Ht 4\' 9"  (1.448 m)   Wt 213 lb 3.2 oz (96.7 kg)   SpO2 96%   BMI 46.14 kg/m  , BMI Body mass index is 46.14 kg/m. GEN: Well nourished, well developed, in no acute distress, obese HEENT: normal  Neck: no JVD, carotid bruits, or masses Cardiac: RRR; no murmurs, rubs, or gallops,no edema  Respiratory:  clear to auscultation bilaterally, normal work of breathing GI: soft, nontender, nondistended, + BS MS: no deformity or atrophy  Skin: warm and dry, no rash Neuro:  Alert and Oriented x 3, Strength and sensation are intact Psych: euthymic mood, full affect   EKG:  01/15/17 sinus bradycardia rate 50 with nonspecific T-wave flattening, no other abnormalities personally viewed.   Recent Labs: 06/05/2019: ALT 16; BUN 8; Creatinine, Ser 0.88; Hemoglobin 13.7; Platelets 185; Potassium 4.0; Sodium 139    Lipid Panel    Component Value Date/Time   CHOL 158 11/15/2018 1550   TRIG 138 11/15/2018 1550   HDL 46 11/15/2018 1550   CHOLHDL 3.4 11/15/2018 1550   CHOLHDL 3.4 05/17/2009 0130   VLDL 19 05/17/2009 0130   LDLCALC 84 11/15/2018 1550      Wt Readings from Last 3 Encounters:  08/23/19 213 lb 3.2 oz (96.7 kg)  07/25/19 210 lb (95.3 kg)  07/06/19 210 lb 12.8 oz (95.6 kg)      Other studies Reviewed: Additional studies/ records that were reviewed  today include: Prior medical records reviewed, lab work reviewed. Review of the above records demonstrates: as above  Cath 12/20/15:  Prox RCA to Mid RCA lesion, 55% stenosed. Mid RCA to Dist RCA lesion, 100% stenosed. Mild disease of the Ostial-Prox SVG-dRCA(filling PLA & PDA).  Mid LAD lesion, 100% stenosed. LIMA-LAD is patent, but very small to a small distal LAD. Very tortuous graft - not a good option to attempt PCI through.  Prox Cx to Mid Cx lesion, 100% stenosed. The lesion was previously treated with a stent (unknown type) greater than two years ago. Mid Cx lesion, 100% stenosed. Patent SVG-OM2 & OM3.  Origin to Prox SVG-Diag Graft lesion, 100% stenosed. Known.  Sequential SVG-OM2-OM3 was injected is large & widely patent.  The left ventricular systolic function is normal.   No real change in anatomy from 2011 catheterization. No culprit lesion to explain the patient's stress test.  Normal EF with normal EDP and stable coronary arteries.  ASSESSMENT AND PLAN:  Coronary artery disease/angina -Cath 12/20/15 reassuring, no ischemia, normal EF, no change in anatomy. -Aspirin 81.  Continue with metoprolol.  -  Prior visit to ER atypical chest pain.  Left shoulder pain.  Reassurance.  -Overall has been doing very well.  No recurrent symptoms.   History of CABG bypass surgery 2007 -Cardiac catheterization as described above -Discussed her prior cardiac catheterization again.  Reassuring.  No changes made.  Hyperlipidemia -Seems to be tolerating the Crestor 10 mg well.  No changes made.  Close to goal of less than 70.  LDL.  Morbid obesity -Encourage continued weight loss -Gastric bypass (was 525 lbs at one point), amazing weight loss.  We discussed again today. - Has some residual abdominal discomfort at scar site she thinks. No evidence of cholecystitis, normal gallbladder ultrasound previously.  -Doing very well.  Essential hypertension -No changes in medications. Well  controlled.  Excellent.  Medications reviewed.  Angina -metoprolol, antianginal. Stable coronary disease as described above.  Since she has been doing quite well without any anginal symptoms, stopped her isosorbide previously.    Former smoker -quit over 10 years ago.  No changes.   Current medicines are reviewed at length with the patient today.  The patient does not have concerns regarding medicines.  The following changes have been made:  no change  Labs/ tests ordered today include:  No orders of the defined types were placed in this encounter.  Disposition:    6 months follow up Cecille Rubin, one year me    Signed, Candee Furbish, MD  08/23/2019 10:54 AM    [facility] [address], [place], [place]  [address] Phone: [phone]; Fax: [phone]

## 2019-08-23 NOTE — Patient Instructions (Signed)
Medication Instructions:   Your physician recommends that you continue on your current medications as directed. Please refer to the Current Medication list given to you today.  *If you need a refill on your cardiac medications before your next appointment, please call your pharmacy*    Follow-Up: At [facility], you and your health needs are our priority.  As part of our continuing mission to provide you with exceptional heart care, we have created designated Provider Care Teams.  These Care Teams include your primary Cardiologist (physician) and Advanced Practice Providers (APPs -  Physician Assistants and Nurse Practitioners) who all work together to provide you with the care you need, when you need it.  Your next appointment:   12 month(s)  The format for your next appointment:   In Person  Provider:   Candee Furbish, MD

## 2019-09-11 ENCOUNTER — Other Ambulatory Visit: Payer: Self-pay

## 2019-09-11 ENCOUNTER — Emergency Department (HOSPITAL_COMMUNITY)
Admission: EM | Admit: 2019-09-11 | Discharge: 2019-09-11 | Disposition: A | Discharge status: Home / Self Care | Payer: Medicare Other | Attending: Emergency Medicine | Admitting: Emergency Medicine

## 2019-09-11 ENCOUNTER — Emergency Department (HOSPITAL_COMMUNITY): Payer: Medicare Other

## 2019-09-11 ENCOUNTER — Encounter (HOSPITAL_COMMUNITY): Payer: Self-pay | Admitting: Emergency Medicine

## 2019-09-11 DIAGNOSIS — Z87891 Personal history of nicotine dependence: Secondary | ICD-10-CM | POA: Diagnosis not present

## 2019-09-11 DIAGNOSIS — Z79899 Other long term (current) drug therapy: Secondary | ICD-10-CM | POA: Insufficient documentation

## 2019-09-11 DIAGNOSIS — Z7984 Long term (current) use of oral hypoglycemic drugs: Secondary | ICD-10-CM | POA: Insufficient documentation

## 2019-09-11 DIAGNOSIS — R109 Unspecified abdominal pain: Secondary | ICD-10-CM

## 2019-09-11 DIAGNOSIS — J45909 Unspecified asthma, uncomplicated: Secondary | ICD-10-CM | POA: Diagnosis not present

## 2019-09-11 DIAGNOSIS — I110 Hypertensive heart disease with heart failure: Secondary | ICD-10-CM | POA: Insufficient documentation

## 2019-09-11 DIAGNOSIS — I2510 Atherosclerotic heart disease of native coronary artery without angina pectoris: Secondary | ICD-10-CM | POA: Diagnosis not present

## 2019-09-11 DIAGNOSIS — Z7982 Long term (current) use of aspirin: Secondary | ICD-10-CM | POA: Insufficient documentation

## 2019-09-11 DIAGNOSIS — Z951 Presence of aortocoronary bypass graft: Secondary | ICD-10-CM | POA: Insufficient documentation

## 2019-09-11 DIAGNOSIS — E119 Type 2 diabetes mellitus without complications: Secondary | ICD-10-CM | POA: Insufficient documentation

## 2019-09-11 DIAGNOSIS — I509 Heart failure, unspecified: Secondary | ICD-10-CM | POA: Insufficient documentation

## 2019-09-11 DIAGNOSIS — Z91040 Latex allergy status: Secondary | ICD-10-CM | POA: Diagnosis not present

## 2019-09-11 LAB — COMPREHENSIVE METABOLIC PANEL
ALT: 20 U/L (ref 0–44)
AST: 22 U/L (ref 15–41)
Albumin: 3.7 g/dL (ref 3.5–5.0)
Alkaline Phosphatase: 69 U/L (ref 38–126)
Anion gap: 10 (ref 5–15)
BUN: 8 mg/dL (ref 8–23)
CO2: 24 mmol/L (ref 22–32)
Calcium: 9.2 mg/dL (ref 8.9–10.3)
Chloride: 105 mmol/L (ref 98–111)
Creatinine, Ser: 0.73 mg/dL (ref 0.44–1.00)
GFR calc Af Amer: >60 mL/min (ref >60)
GFR calc non Af Amer: >60 mL/min (ref >60)
Glucose, Bld: 150 mg/dL — ABNORMAL HIGH (ref 70–99)
Potassium: 4 mmol/L (ref 3.5–5.1)
Sodium: 139 mmol/L (ref 135–145)
Total Bilirubin: 0.2 mg/dL — ABNORMAL LOW (ref 0.3–1.2)
Total Protein: 6.8 g/dL (ref 6.5–8.1)

## 2019-09-11 LAB — LIPASE, BLOOD: Lipase: 26 U/L (ref 11–51)

## 2019-09-11 LAB — CBC
HCT: 41.6 % (ref 36.0–46.0)
Hemoglobin: 12.9 g/dL (ref 12.0–15.0)
MCH: 28.4 pg (ref 26.0–34.0)
MCHC: 31 g/dL (ref 30.0–36.0)
MCV: 91.4 fL (ref 80.0–100.0)
Platelets: 183 10*3/uL (ref 150–400)
RBC: 4.55 MIL/uL (ref 3.87–5.11)
RDW: 15 % (ref 11.5–15.5)
WBC: 6.8 10*3/uL (ref 4.0–10.5)
nRBC: 0 % (ref 0.0–0.2)

## 2019-09-11 LAB — URINALYSIS, ROUTINE W REFLEX MICROSCOPIC
Bilirubin Urine: NEGATIVE
Glucose, UA: NEGATIVE mg/dL
Hgb urine dipstick: NEGATIVE
Ketones, ur: NEGATIVE mg/dL
Leukocytes,Ua: NEGATIVE
Nitrite: NEGATIVE
Protein, ur: NEGATIVE mg/dL
Specific Gravity, Urine: 1.029 (ref 1.005–1.030)
pH: 5 (ref 5.0–8.0)

## 2019-09-11 MED ORDER — MORPHINE SULFATE (PF) 4 MG/ML IV SOLN
4.0000 mg | Freq: Once | INTRAVENOUS | Status: AC
  Administered 2019-09-11: 07:00:00 4 mg via INTRAVENOUS
  Filled 2019-09-11: qty 1

## 2019-09-11 MED ORDER — LORAZEPAM 2 MG/ML IJ SOLN: 1.0000 mg | Freq: Once | INTRAMUSCULAR | Status: DC

## 2019-09-11 MED ORDER — SODIUM CHLORIDE 0.9% FLUSH: 3.0000 mL | Freq: Once | INTRAVENOUS | Status: DC

## 2019-09-11 MED ORDER — ONDANSETRON HCL 4 MG/2ML IJ SOLN
4.0000 mg | Freq: Once | INTRAMUSCULAR | Status: AC
  Administered 2019-09-11: 4 mg via INTRAVENOUS
  Filled 2019-09-11: qty 2

## 2019-09-11 MED ORDER — IOHEXOL 350 MG/ML SOLN
100.0000 mL | Freq: Once | INTRAVENOUS | Status: AC | PRN
  Administered 2019-09-11: 100 mL via INTRAVENOUS

## 2019-09-11 NOTE — ED Provider Notes (Signed)
[facility] EMERGENCY DEPARTMENT Provider Note   CSN: [id number] Arrival date & time: 09/11/19  0355   History Chief Complaint  Patient presents with  . Extremity Weakness  . Abdominal Pain  . Back Pain    Patricia Cuevas is a 68 y.o. female.  The history is provided by the patient.  Extremity Weakness Associated symptoms include abdominal pain.  Abdominal Pain Back Pain Associated symptoms: abdominal pain   She has history of diabetes, hypertension, hyperlipidemia and comes in complaining of abdominal pain which radiates up to her chest and shoulder.  Pain started 2 days ago but became much worse yesterday.  She rates pain 10/10.  Nothing makes it better, nothing makes it worse.  She denies fever, chills, sweats.  She denies nausea, vomiting, diarrhea.  She denies dysuria or urinary urgency or frequency.  She did take a dose of hydrocodone-acetaminophen without relief.  She also is describing a numb feeling in the same area where she is having pain.  She has not noticed any weakness.  Past Medical History:  Diagnosis Date  . Alcohol abuse    in the past, resolved  . Arthritis   . Asthma   . Blood transfusion without reported diagnosis 1984   after surgery  . Breast cancer [facility])    Double mastectomy 2007; s/p tamoxifen and arimidex therapy; no recurrence since 05/2008  . CAD (coronary artery disease)    Nuclear stress test Feb 11, 2011, EF 64%, no scar or ischemia    //         catheterization, November, 2011,patent LIMA-LAD-small caliber distal vessel with diffuse plaque, patent SVG to OM1 and OM 2, patent SVG to PDA and PLA, occluded SVG to diagonal, medical therapy;  Lexiscan Myoview 6/14:  Inferior thinning, no ischemia, EF 61%, low risk  . CHF (congestive heart failure) ([place])   . Chronic back pain   . Diabetes mellitus   . Dyslipidemia   . Ejection fraction    EF 65%, echo, [place], Jan 16, 2011  . Hx of CABG    2007  . Hx of colonic polyps   .  Hyperlipidemia   . Hypertension   . Myocardial infarction ([place])   . Nausea & vomiting    hospitalization, November, 2011, stricture GE junction, questionable stenosis gastrojejunostomy, disruptive primary peristaltic wave, GE reflux, delayed emptying proximal gastric pouch  . Normal cardiac stress test 02/2013  . Overweight(278.02)    stomach stapling 1985 followed by weight loss, then return of weight  . Sciatica   . Sleep apnea    CPAP being arranged May, 2011//does not use CPAP  . Tobacco abuse    in the past, resolved  . UTI (urinary tract infection)     Patient Active Problem List   Diagnosis Date Noted  . Asthma 03/07/2017  . Sleep apnea 03/07/2017  . Diabetes mellitus type 2, controlled ([place]) 03/07/2017  . Hyperlipidemia 03/07/2017  . CAD (coronary artery disease) with prior CABG 03/07/2017  . UTI (urinary tract infection) 03/07/2017  . Sepsis ([place]) 03/07/2017  . Sepsis due to urinary tract infection ([place]) 03/07/2017  . HTN (hypertension) 03/07/2017  . Constipation by delayed colonic transit 03/07/2017  . Abnormal nuclear stress test 12/20/2015  . Angina, class III ([place]) 12/20/2015  . Chest pain 11/12/2014  . Upper GI bleed 03/04/2013  . Atypical chest pain 01/07/2013  . History of tobacco abuse 01/07/2013  . SIRS (systemic inflammatory response syndrome) ([place]) 07/28/2012  . Tachycardia  07/28/2012  . GERD (gastroesophageal reflux disease) 02/24/2011  . Coronary artery disease involving native coronary artery with angina pectoris ([place])   . Hx of CABG   . Overweight   . OSA (obstructive sleep apnea)   . Nausea & vomiting   . Breast cancer ([place])   . Hypertension   . Diabetes mellitus ([place])   . Dyslipidemia   . Ejection fraction     Past Surgical History:  Procedure Laterality Date  . ABDOMINAL HYSTERECTOMY  1989  . CARDIAC CATHETERIZATION N/A 12/20/2015   Procedure: Left Heart Cath and Cors/Grafts Angiography;  Surgeon: Leonie Man, MD;  Location: [facility]  CV LAB;  Service: Cardiovascular;  Laterality: N/A;  . CORONARY ARTERY BYPASS GRAFT  2009  . CORONARY STENT PLACEMENT    . GASTRIC BYPASS  1983   open.  [place]  . MASTECTOMY Bilateral 1987     OB History   No obstetric history on file.     Family History  Problem Relation Age of Onset  . Hypertension Mother   . Heart disease Mother   . Diabetes Mother   . Heart disease Father   . Colon cancer Father        does not know age of onset  . Heart disease Sister   . Colon cancer Sister 10  . Stomach cancer Sister   . Diabetes Sister   . Asthma Sister   . Diabetes Sister   . Esophageal cancer Neg Hx   . Rectal cancer Neg Hx     Social History   Tobacco Use  . Smoking status: Former Smoker    Packs/day: 3.00    Years: 27.00    Pack years: 81.00    Types: Cigarettes    Quit date: 09/14/2000    Years since quitting: 19.0  . Smokeless tobacco: Never Used  Substance Use Topics  . Alcohol use: No  . Drug use: No    Home Medications Prior to Admission medications   Medication Sig Start Date End Date Taking? Authorizing Provider  acetaminophen (TYLENOL) 500 MG tablet Take 1 tablet (500 mg total) every 6 (six) hours as needed by mouth. 07/27/17   Kirichenko, Tatyana, PA-C  albuterol (PROVENTIL HFA;VENTOLIN HFA) 108 (90 BASE) MCG/ACT inhaler Inhale 2 puffs into the lungs every 6 (six) hours as needed for shortness of breath.     [provider]  aspirin EC 81 MG tablet Take 81 mg by mouth daily.     [provider]  cetirizine (ZYRTEC) 10 MG tablet Take 1 tablet (10 mg total) by mouth daily. 08/16/17   Maczis, Barth Kirks, PA-C  cholecalciferol (VITAMIN D3) 25 MCG (1000 UT) tablet Take 1,000 Units by mouth daily.    [provider]  fluticasone (FLONASE) 50 MCG/ACT nasal spray Place 2 sprays into both nostrils daily. 08/16/17   Maczis, Barth Kirks, PA-C  glipiZIDE (GLUCOTROL XL) 10 MG 24 hr tablet Take 1 tablet (10 mg total) by mouth daily with breakfast.  12/17/15   [name], NP  HYDROcodone-acetaminophen (NORCO) 10-325 MG tablet Take 1 tablet by mouth 2 (two) times daily as needed for moderate pain.    [provider]  latanoprost (XALATAN) 0.005 % ophthalmic solution Place 1 drop into both eyes at bedtime.    [provider]  lisinopril-hydrochlorothiazide (PRINZIDE,ZESTORETIC) 20-12.5 MG per tablet Take 1 tablet by mouth daily.      [provider]  metoprolol tartrate (LOPRESSOR) 25 MG tablet TAKE 1  TABLET BY MOUTH 2 TIMES A DAY 07/11/19   [name], MD  montelukast (SINGULAIR) 10 MG tablet Take 10 mg by mouth daily. 08/14/19   [provider]  naproxen (NAPROSYN) 375 MG tablet Take 1 tablet (375 mg total) by mouth 2 (two) times daily. 0000000   [name], MD  nitroGLYCERIN (NITROSTAT) 0.4 MG SL tablet Place 1 tablet (0.4 mg total) under the tongue every 5 (five) minutes as needed for chest pain. 01/15/17   [name], MD  rosuvastatin (CRESTOR) 10 MG tablet Take 1 tablet (10 mg total) by mouth at bedtime. 08/26/18   [name], MD    Allergies    Latex  Review of Systems   Review of Systems  Gastrointestinal: Positive for abdominal pain.  Musculoskeletal: Positive for back pain and extremity weakness.  All other systems reviewed and are negative.   Physical Exam Updated Vital Signs BP (!) 105/51 (BP Location: Right Arm)   Pulse 79   Temp 99 F (37.2 C) (Oral)   Resp 14   Wt 96.7 kg   SpO2 96%   BMI 46.13 kg/m   Physical Exam Vitals and nursing note reviewed.   69 year old female, resting comfortably and in no acute distress. Vital signs are normal. Oxygen saturation is 96%, which is normal. Head is normocephalic and atraumatic. PERRLA, EOMI. Oropharynx is clear. Neck is nontender and supple without adenopathy or JVD. Back is nontender in the midline.  There is moderate left CVA tenderness. Lungs are clear without rales, wheezes, or rhonchi. Chest is nontender.  Heart has regular rate and rhythm without murmur. Abdomen is soft, flat, with mild to moderate tenderness diffusely through the abdomen.  Maximum tenderness is in the suprapubic area and left lower quadrant.  There is no rebound or guarding.  There are no masses or hepatosplenomegaly and peristalsis is hypoactive. Extremities have no cyanosis or edema, full range of motion is present. Skin is warm and dry without rash. Neurologic: Mental status is normal, cranial nerves are intact, there are no motor or sensory deficits.  ED Results / Procedures / Treatments   Labs (all labs ordered are listed, but only abnormal results are displayed) Labs Reviewed  COMPREHENSIVE METABOLIC PANEL - Abnormal; Notable for the following components:      Result Value   Glucose, Bld 150 (*)    Total Bilirubin 0.2 (*)    All other components within normal limits  LIPASE, BLOOD  CBC  URINALYSIS, ROUTINE W REFLEX MICROSCOPIC    Radiology No results found.  Procedures Procedures  Medications Ordered in ED Medications  sodium chloride flush (NS) 0.9 % injection 3 mL (has no administration in time range)  morphine 4 MG/ML injection 4 mg (4 mg Intravenous Given 09/11/19 0716)  ondansetron (ZOFRAN) injection 4 mg (4 mg Intravenous Given 09/11/19 0716)    ED Course  I have reviewed the triage vital signs and the nursing notes.  Pertinent labs & imaging results that were available during my care of the patient were reviewed by me and considered in my medical decision making (see chart for details).    MDM Rules/Calculators/A&P Abdominal pain and flank tenderness of uncertain cause.  Consider urinary tract infection, urolithiasis, diverticulitis.  Old records are reviewed, and she had a renal stone protocol CT scan done in 2018 showing no evidence of renal calculi and no aortic aneurysm, so aortic aneurysm is felt to be very unlikely.  Labs are unremarkable, urinalysis pending.  She  is being sent for CT of  abdomen and pelvis.  Case is signed out to Dr. Jeanell Sparrow.  Final Clinical Impression(s) / ED Diagnoses Final diagnoses:  Abdominal pain, unspecified abdominal location    Rx / DC Orders ED Discharge Orders    None       [name], MD [phone]

## 2019-09-11 NOTE — ED Notes (Signed)
Wheeled patient to the bathroom patient is now gone to x ray

## 2019-09-11 NOTE — ED Provider Notes (Signed)
Sign out from Dr. Roxanne Mins- 68 yo female co abdominal pain nausea and vomiting.  Dr. Roxanne Mins examined and did work up.  CT abdomen and pelvis pending.  Urine pending. Plan d/c if negative. CT reviewed IMPRESSION: 1. No findings to explain the patient's pain. Specifically, no evidence of diverticulitis. 2. Cholelithiasis. 3.  Aortic atherosclerosis (ICD10-170.0).  Discussed results with patient and reevaluated.  She appears stable for discharge.  Discussed return precautions and need for follow-up   Pattricia Boss, MD 09/11/19 [phone]

## 2019-09-11 NOTE — ED Triage Notes (Signed)
Pt in via [facility] with L arm weakness that began yesterday morning. Reports pain when lifting it, but no drift present and grip strength equal. Multiple other complaints - c/o dry mouth, malaise, LUQ pain and low back pain x 1 day. Denies any n/v/d, sob, cp or neuro deficits

## 2019-10-09 ENCOUNTER — Other Ambulatory Visit: Payer: Self-pay | Admitting: Cardiology

## 2019-10-09 DIAGNOSIS — R0789 Other chest pain: Secondary | ICD-10-CM

## 2019-10-19 ENCOUNTER — Encounter (HOSPITAL_COMMUNITY): Payer: Self-pay | Admitting: Emergency Medicine

## 2019-10-19 ENCOUNTER — Other Ambulatory Visit: Payer: Self-pay

## 2019-10-19 ENCOUNTER — Emergency Department (HOSPITAL_COMMUNITY)
Admission: EM | Admit: 2019-10-19 | Discharge: 2019-10-20 | Disposition: A | Discharge status: Home / Self Care | Payer: Medicare Other | Attending: Emergency Medicine | Admitting: Emergency Medicine

## 2019-10-19 ENCOUNTER — Emergency Department (HOSPITAL_COMMUNITY): Payer: Medicare Other

## 2019-10-19 DIAGNOSIS — Z7984 Long term (current) use of oral hypoglycemic drugs: Secondary | ICD-10-CM | POA: Diagnosis not present

## 2019-10-19 DIAGNOSIS — Z955 Presence of coronary angioplasty implant and graft: Secondary | ICD-10-CM | POA: Diagnosis not present

## 2019-10-19 DIAGNOSIS — I2510 Atherosclerotic heart disease of native coronary artery without angina pectoris: Secondary | ICD-10-CM | POA: Insufficient documentation

## 2019-10-19 DIAGNOSIS — R1032 Left lower quadrant pain: Secondary | ICD-10-CM | POA: Insufficient documentation

## 2019-10-19 DIAGNOSIS — Z7982 Long term (current) use of aspirin: Secondary | ICD-10-CM | POA: Insufficient documentation

## 2019-10-19 DIAGNOSIS — Z951 Presence of aortocoronary bypass graft: Secondary | ICD-10-CM | POA: Insufficient documentation

## 2019-10-19 DIAGNOSIS — I110 Hypertensive heart disease with heart failure: Secondary | ICD-10-CM | POA: Diagnosis not present

## 2019-10-19 DIAGNOSIS — J45909 Unspecified asthma, uncomplicated: Secondary | ICD-10-CM | POA: Insufficient documentation

## 2019-10-19 DIAGNOSIS — Z87891 Personal history of nicotine dependence: Secondary | ICD-10-CM | POA: Diagnosis not present

## 2019-10-19 DIAGNOSIS — I509 Heart failure, unspecified: Secondary | ICD-10-CM | POA: Diagnosis not present

## 2019-10-19 DIAGNOSIS — Z853 Personal history of malignant neoplasm of breast: Secondary | ICD-10-CM | POA: Diagnosis not present

## 2019-10-19 DIAGNOSIS — E119 Type 2 diabetes mellitus without complications: Secondary | ICD-10-CM | POA: Diagnosis not present

## 2019-10-19 DIAGNOSIS — Z91040 Latex allergy status: Secondary | ICD-10-CM | POA: Diagnosis not present

## 2019-10-19 DIAGNOSIS — Z79899 Other long term (current) drug therapy: Secondary | ICD-10-CM | POA: Diagnosis not present

## 2019-10-19 DIAGNOSIS — R1084 Generalized abdominal pain: Secondary | ICD-10-CM

## 2019-10-19 LAB — URINALYSIS, ROUTINE W REFLEX MICROSCOPIC
Bilirubin Urine: NEGATIVE
Glucose, UA: NEGATIVE mg/dL
Hgb urine dipstick: NEGATIVE
Ketones, ur: NEGATIVE mg/dL
Leukocytes,Ua: NEGATIVE
Nitrite: NEGATIVE
Protein, ur: NEGATIVE mg/dL
Specific Gravity, Urine: 1.023 (ref 1.005–1.030)
pH: 6 (ref 5.0–8.0)

## 2019-10-19 LAB — CBC
HCT: 42.6 % (ref 36.0–46.0)
Hemoglobin: 13.2 g/dL (ref 12.0–15.0)
MCH: 27.9 pg (ref 26.0–34.0)
MCHC: 31 g/dL (ref 30.0–36.0)
MCV: 90.1 fL (ref 80.0–100.0)
Platelets: 199 10*3/uL (ref 150–400)
RBC: 4.73 MIL/uL (ref 3.87–5.11)
RDW: 14.7 % (ref 11.5–15.5)
WBC: 7.8 10*3/uL (ref 4.0–10.5)
nRBC: 0 % (ref 0.0–0.2)

## 2019-10-19 LAB — COMPREHENSIVE METABOLIC PANEL
ALT: 13 U/L (ref 0–44)
AST: 20 U/L (ref 15–41)
Albumin: 3.6 g/dL (ref 3.5–5.0)
Alkaline Phosphatase: 64 U/L (ref 38–126)
Anion gap: 10 (ref 5–15)
BUN: 11 mg/dL (ref 8–23)
CO2: 23 mmol/L (ref 22–32)
Calcium: 9.6 mg/dL (ref 8.9–10.3)
Chloride: 105 mmol/L (ref 98–111)
Creatinine, Ser: 0.89 mg/dL (ref 0.44–1.00)
GFR calc Af Amer: >60 mL/min (ref >60)
GFR calc non Af Amer: >60 mL/min (ref >60)
Glucose, Bld: 83 mg/dL (ref 70–99)
Potassium: 4.2 mmol/L (ref 3.5–5.1)
Sodium: 138 mmol/L (ref 135–145)
Total Bilirubin: 0.5 mg/dL (ref 0.3–1.2)
Total Protein: 7 g/dL (ref 6.5–8.1)

## 2019-10-19 LAB — LIPASE, BLOOD: Lipase: 24 U/L (ref 11–51)

## 2019-10-19 MED ORDER — IOHEXOL 300 MG/ML  SOLN
100.0000 mL | Freq: Once | INTRAMUSCULAR | Status: AC | PRN
  Administered 2019-10-19: 100 mL via INTRAVENOUS

## 2019-10-19 MED ORDER — MORPHINE SULFATE (PF) 4 MG/ML IV SOLN
4.0000 mg | Freq: Once | INTRAVENOUS | Status: AC
  Administered 2019-10-19: 4 mg via INTRAVENOUS
  Filled 2019-10-19: qty 1

## 2019-10-19 MED ORDER — ONDANSETRON HCL 4 MG/2ML IJ SOLN
4.0000 mg | Freq: Once | INTRAMUSCULAR | Status: AC
  Administered 2019-10-19: 4 mg via INTRAVENOUS
  Filled 2019-10-19: qty 2

## 2019-10-19 MED ORDER — SODIUM CHLORIDE 0.9% FLUSH
3.0000 mL | Freq: Once | INTRAVENOUS | Status: AC
  Administered 2019-10-19: 22:00:00 3 mL via INTRAVENOUS

## 2019-10-19 NOTE — ED Notes (Signed)
Pt transported to CT ?

## 2019-10-19 NOTE — ED Triage Notes (Signed)
Pt arrives to ED from home with complaints of left lower quadrant abdominal pain and left flank pain starting yesterday. Patient denies chest pain, SOB, N/V, and urinary symptoms.

## 2019-10-19 NOTE — ED Notes (Signed)
Pt provided with fluids

## 2019-10-19 NOTE — ED Provider Notes (Signed)
[place] EMERGENCY DEPARTMENT Provider Note   CSN: [id number] Arrival date & time: 10/19/19  1821     History Chief Complaint  Patient presents with  . Abdominal Pain  . Flank Pain    Patricia Cuevas is a 69 y.o. female with past medical history significant for arthritis, CAD s/pCABG, breast cancer s/p double masectomy, chronic back pain, diabetes presents to emergency department today with chief complaining of abdominal pain and flank pain x 1 day. Patient states pain is located in her left lower quadrant and radiates around to her left flank.  She states the pain has been constant.  She describes pain as a sharp stabbing sensation.  She rates the pain 10/10 in severity. She has not taken any medications for pain prior to arrival.  Last bowel movement was this morning and normal per patient.   She reports history of similar pain with past kidney stone, does not remember when that was.  Denies fever, chills, chest pain, shortness of breath, gross hematuria, urinary frequency, blood in tool, diarrhea, lower extremity edema.   Chart review shows patient had ED visit with abdominal pain radiating to he chest and shoulder on 09/11/2019. Labs and CT A/P were unremarkable at that visit. She also had CT renal study in 2018 without evidence of stone or aortic aneurysm.     Past Medical History:  Diagnosis Date  . Alcohol abuse    in the past, resolved  . Arthritis   . Asthma   . Blood transfusion without reported diagnosis 1984   after surgery  . Breast cancer [facility])    Double mastectomy 2007; s/p tamoxifen and arimidex therapy; no recurrence since 05/2008  . CAD (coronary artery disease)    Nuclear stress test Feb 11, 2011, EF 64%, no scar or ischemia    //         catheterization, November, 2011,patent LIMA-LAD-small caliber distal vessel with diffuse plaque, patent SVG to OM1 and OM 2, patent SVG to PDA and PLA, occluded SVG to diagonal, medical therapy;  Lexiscan  Myoview 6/14:  Inferior thinning, no ischemia, EF 61%, low risk  . CHF (congestive heart failure) ([place])   . Chronic back pain   . Diabetes mellitus   . Dyslipidemia   . Ejection fraction    EF 65%, echo, [place], Jan 16, 2011  . Hx of CABG    2007  . Hx of colonic polyps   . Hyperlipidemia   . Hypertension   . Myocardial infarction ([place])   . Nausea & vomiting    hospitalization, November, 2011, stricture GE junction, questionable stenosis gastrojejunostomy, disruptive primary peristaltic wave, GE reflux, delayed emptying proximal gastric pouch  . Normal cardiac stress test 02/2013  . Overweight(278.02)    stomach stapling 1985 followed by weight loss, then return of weight  . Sciatica   . Sleep apnea    CPAP being arranged May, 2011//does not use CPAP  . Tobacco abuse    in the past, resolved  . UTI (urinary tract infection)     Patient Active Problem List   Diagnosis Date Noted  . Asthma 03/07/2017  . Sleep apnea 03/07/2017  . Diabetes mellitus type 2, controlled ([place]) 03/07/2017  . Hyperlipidemia 03/07/2017  . CAD (coronary artery disease) with prior CABG 03/07/2017  . UTI (urinary tract infection) 03/07/2017  . Sepsis ([place]) 03/07/2017  . Sepsis due to urinary tract infection ([place]) 03/07/2017  . HTN (hypertension) 03/07/2017  . Constipation by delayed  colonic transit 03/07/2017  . Abnormal nuclear stress test 12/20/2015  . Angina, class III ([place]) 12/20/2015  . Chest pain 11/12/2014  . Upper GI bleed 03/04/2013  . Atypical chest pain 01/07/2013  . History of tobacco abuse 01/07/2013  . SIRS (systemic inflammatory response syndrome) ([place]) 07/28/2012  . Tachycardia 07/28/2012  . GERD (gastroesophageal reflux disease) 02/24/2011  . Coronary artery disease involving native coronary artery with angina pectoris ([place])   . Hx of CABG   . Overweight   . OSA (obstructive sleep apnea)   . Nausea & vomiting   . Breast cancer ([place])   . Hypertension   . Diabetes mellitus  ([place])   . Dyslipidemia   . Ejection fraction     Past Surgical History:  Procedure Laterality Date  . ABDOMINAL HYSTERECTOMY  1989  . CARDIAC CATHETERIZATION N/A 12/20/2015   Procedure: Left Heart Cath and Cors/Grafts Angiography;  Surgeon: Leonie Man, MD;  Location: [place] CV LAB;  Service: Cardiovascular;  Laterality: N/A;  . CORONARY ARTERY BYPASS GRAFT  2009  . CORONARY STENT PLACEMENT    . GASTRIC BYPASS  1983   open.  [place]  . MASTECTOMY Bilateral 1987     OB History   No obstetric history on file.     Family History  Problem Relation Age of Onset  . Hypertension Mother   . Heart disease Mother   . Diabetes Mother   . Heart disease Father   . Colon cancer Father        does not know age of onset  . Heart disease Sister   . Colon cancer Sister 26  . Stomach cancer Sister   . Diabetes Sister   . Asthma Sister   . Diabetes Sister   . Esophageal cancer Neg Hx   . Rectal cancer Neg Hx     Social History   Tobacco Use  . Smoking status: Former Smoker    Packs/day: 3.00    Years: 27.00    Pack years: 81.00    Types: Cigarettes    Quit date: 09/14/2000    Years since quitting: 19.1  . Smokeless tobacco: Never Used  Substance Use Topics  . Alcohol use: No  . Drug use: No    Home Medications Prior to Admission medications   Medication Sig Start Date End Date Taking? Authorizing Provider  acetaminophen (TYLENOL) 500 MG tablet Take 1 tablet (500 mg total) every 6 (six) hours as needed by mouth. 07/27/17   Kirichenko, Tatyana, PA-C  albuterol (PROVENTIL HFA;VENTOLIN HFA) 108 (90 BASE) MCG/ACT inhaler Inhale 2 puffs into the lungs every 6 (six) hours as needed for shortness of breath.     [provider]  aspirin EC 81 MG tablet Take 81 mg by mouth daily.     [provider]  cetirizine (ZYRTEC) 10 MG tablet Take 1 tablet (10 mg total) by mouth daily. 08/16/17   Maczis, Barth Kirks, PA-C  cholecalciferol (VITAMIN D3) 25 MCG (1000 UT)  tablet Take 1,000 Units by mouth daily.    [provider]  fluticasone (FLONASE) 50 MCG/ACT nasal spray Place 2 sprays into both nostrils daily. 08/16/17   Maczis, Barth Kirks, PA-C  glipiZIDE (GLUCOTROL XL) 10 MG 24 hr tablet Take 1 tablet (10 mg total) by mouth daily with breakfast. 12/17/15   [name], NP  HYDROcodone-acetaminophen (NORCO) 10-325 MG tablet Take 1 tablet by mouth 2 (two) times daily as needed for moderate pain.    [provider]  latanoprost (XALATAN) 0.005 % ophthalmic solution Place 1 drop into both eyes at bedtime.    [provider]  lisinopril-hydrochlorothiazide (PRINZIDE,ZESTORETIC) 20-12.5 MG per tablet Take 1 tablet by mouth daily.      [provider]  metoprolol tartrate (LOPRESSOR) 25 MG tablet TAKE 1 TABLET BY MOUTH 2 TIMES A DAY 10/09/19   [name], MD  montelukast (SINGULAIR) 10 MG tablet Take 10 mg by mouth daily. 08/14/19   [provider]  naproxen (NAPROSYN) 375 MG tablet Take 1 tablet (375 mg total) by mouth 2 (two) times daily. 0000000   [name], MD  nitroGLYCERIN (NITROSTAT) 0.4 MG SL tablet Place 1 tablet (0.4 mg total) under the tongue every 5 (five) minutes as needed for chest pain. 01/15/17   [name], MD  rosuvastatin (CRESTOR) 10 MG tablet Take 1 tablet (10 mg total) by mouth at bedtime. 08/26/18   [name], MD    Allergies    Latex  Review of Systems   Review of Systems  All other systems are reviewed and are negative for acute change except as noted in the HPI.   Physical Exam Updated Vital Signs BP 121/71 (BP Location: Right Arm)   Pulse (!) 58   Temp 99 F (37.2 C) (Oral)   Resp 16   SpO2 99%   Physical Exam Vitals and nursing note reviewed.  Constitutional:      General: She is not in acute distress.    Appearance: She is not ill-appearing.  HENT:     Head: Normocephalic and atraumatic.     Right Ear: Tympanic membrane and external ear normal.     Left Ear:  Tympanic membrane and external ear normal.     Nose: Nose normal.     Mouth/Throat:     Mouth: Mucous membranes are moist.     Pharynx: Oropharynx is clear.  Eyes:     General: No scleral icterus.       Right eye: No discharge.        Left eye: No discharge.     Extraocular Movements: Extraocular movements intact.     Conjunctiva/sclera: Conjunctivae normal.     Pupils: Pupils are equal, round, and reactive to light.  Neck:     Vascular: No JVD.  Cardiovascular:     Rate and Rhythm: Normal rate and regular rhythm.     Pulses: Normal pulses.          Radial pulses are 2+ on the right side and 2+ on the left side.     Heart sounds: Normal heart sounds.  Pulmonary:     Comments: Lungs clear to auscultation in all fields. Symmetric chest rise. No wheezing, rales, or rhonchi. Abdominal:     General: Bowel sounds are normal.     Palpations: Abdomen is soft.     Tenderness: There is no right CVA tenderness or left CVA tenderness.     Comments: Abdomen is soft, non-distended. Tenderness to palpation of left upper and lower quadrants. No rigidity, no guarding. No peritoneal signs.   Musculoskeletal:        General: Normal range of motion.     Cervical back: Normal range of motion.  Skin:    General: Skin is warm and dry.     Capillary Refill: Capillary refill takes less than 2 seconds.  Neurological:     Mental Status: She is oriented to person, place, and time.     GCS:  GCS eye subscore is 4. GCS verbal subscore is 5. GCS motor subscore is 6.     Comments: Fluent speech, no facial droop.  Psychiatric:        Behavior: Behavior normal.       ED Results / Procedures / Treatments   Labs (all labs ordered are listed, but only abnormal results are displayed) Labs Reviewed  LIPASE, BLOOD  COMPREHENSIVE METABOLIC PANEL  CBC  URINALYSIS, ROUTINE W REFLEX MICROSCOPIC    EKG None  Radiology CT ABDOMEN PELVIS W CONTRAST  Result Date: 10/19/2019 CLINICAL DATA:  Left-sided  abdominal pain for 3 days EXAM: CT ABDOMEN AND PELVIS WITH CONTRAST TECHNIQUE: Multidetector CT imaging of the abdomen and pelvis was performed using the standard protocol following bolus administration of intravenous contrast. CONTRAST:  146mL OMNIPAQUE IOHEXOL 300 MG/ML  SOLN COMPARISON:  September 11, 2019 FINDINGS: Lower chest: The visualized heart size within normal limits. No pericardial fluid/thickening. Coronary artery calcifications are seen. No hiatal hernia. There is stable elevation of the right hemidiaphragm with adjacent compressive atelectasis/scarring. Hepatobiliary: The liver is normal in density without focal abnormality.The main portal vein is patent. Tiny layering calcified gallstone is present. Pancreas: Unremarkable. No pancreatic ductal dilatation or surrounding inflammatory changes. Spleen: Normal in size without focal abnormality. Adrenals/Urinary Tract: Both adrenal glands appear normal. The kidneys and collecting system appear normal without evidence of urinary tract calculus or hydronephrosis. Bladder is unremarkable. Stomach/Bowel: The patient is status post gastric bypass. The small bowel and colon are normal in size and contour. Scattered colonic diverticula are noted without diverticulitis. Vascular/Lymphatic: There are no enlarged mesenteric, retroperitoneal, or pelvic lymph nodes. Scattered aortic atherosclerotic calcifications are seen without aneurysmal dilatation. Reproductive: The patient is status post hysterectomy. No adnexal masses or collections seen. Other: Post surgical suture seen along the anterior abdominal wall. Musculoskeletal: No acute or significant osseous findings. IMPRESSION: No acute intra-abdominal or pelvic pathology to explain the patient's symptoms. Cholelithiasis. Diverticulosis without diverticulitis. Aortic Atherosclerosis (ICD10-I70.0). Electronically Signed   By: Prudencio Pair M.D.   On: 10/19/2019 22:51    Procedures Procedures (including critical  care time)  Medications Ordered in ED Medications  sodium chloride flush (NS) 0.9 % injection 3 mL (3 mLs Intravenous Given 10/19/19 2223)  morphine 4 MG/ML injection 4 mg (4 mg Intravenous Given 10/19/19 2221)  ondansetron (ZOFRAN) injection 4 mg (4 mg Intravenous Given 10/19/19 2220)  iohexol (OMNIPAQUE) 300 MG/ML solution 100 mL (100 mLs Intravenous Contrast Given 10/19/19 2218)    ED Course  I have reviewed the triage vital signs and the nursing notes.  Pertinent labs & imaging results that were available during my care of the patient were reviewed by me and considered in my medical decision making (see chart for details).    MDM Rules/Calculators/A&P                      Patient presents to the ED with complaints of abdominal pain. Patient nontoxic appearing, in no apparent distress, vitals WNL . On exam patient tender to left upper and lower quadrants, no peritoneal signs. Will evaluate with labs and CT A/P given the focal tenderness. Analgesics, anti-emetics given.  Labs reviewed and grossly unremarkable. No leukocytosis, no anemia, no significant electrolyte derangements. LFTs, renal function, and lipase WNL. Urinalysis without obvious infection.   CT A/P is negative for any acute findings. Radiologist comments on diverticulosis without diverticulitis.   On repeat abdominal exam patient remains without peritoneal signs, doubt cholecystitis, pancreatitis, diverticulitis,  appendicitis, bowel obstruction/perforation. Patient tolerating PO in the emergency department. Will discharge home with supportive measures. I discussed results, treatment plan, need for PCP follow-up, and return precautions with the patient. Provided opportunity for questions, patient confirmed understanding and is in agreement with plan.    Portions of this note were generated with [employer]. Dictation errors may occur despite best attempts at proofreading.    Final Clinical Impression(s) / ED  Diagnoses Final diagnoses:  Generalized abdominal pain    Rx / DC Orders ED Discharge Orders    None       Cherre Robins, PA-C 10/20/19 0105    [name], MD 10/23/19 1435

## 2019-10-20 NOTE — Discharge Instructions (Addendum)
You have been seen today for abdominal pain. Please read and follow all provided instructions. Return to the emergency room for worsening condition or new concerning symptoms.    Your blood work today was all normal. Your urine does not show infection. The CT of your belly did also not show infection or kidney stone.  1. Medications:  Continue usual home medications Take medications as prescribed. Please review all of the medicines and only take them if you do not have an allergy to them.   2. Treatment: rest, drink plenty of fluids  3. Follow Up:  Please follow up with primary care provider by scheduling an appointment as soon as possible for a visit    ?

## 2019-11-25 ENCOUNTER — Emergency Department (HOSPITAL_COMMUNITY)
Admission: EM | Admit: 2019-11-25 | Discharge: 2019-11-25 | Disposition: A | Discharge status: Home / Self Care | Payer: Medicare Other | Attending: Emergency Medicine | Admitting: Emergency Medicine

## 2019-11-25 ENCOUNTER — Other Ambulatory Visit: Payer: Self-pay

## 2019-11-25 ENCOUNTER — Encounter (HOSPITAL_COMMUNITY): Payer: Self-pay

## 2019-11-25 DIAGNOSIS — Z91040 Latex allergy status: Secondary | ICD-10-CM | POA: Insufficient documentation

## 2019-11-25 DIAGNOSIS — E119 Type 2 diabetes mellitus without complications: Secondary | ICD-10-CM | POA: Insufficient documentation

## 2019-11-25 DIAGNOSIS — I2510 Atherosclerotic heart disease of native coronary artery without angina pectoris: Secondary | ICD-10-CM | POA: Diagnosis not present

## 2019-11-25 DIAGNOSIS — I10 Essential (primary) hypertension: Secondary | ICD-10-CM | POA: Insufficient documentation

## 2019-11-25 DIAGNOSIS — S40862A Insect bite (nonvenomous) of left upper arm, initial encounter: Secondary | ICD-10-CM | POA: Diagnosis not present

## 2019-11-25 DIAGNOSIS — Y929 Unspecified place or not applicable: Secondary | ICD-10-CM | POA: Insufficient documentation

## 2019-11-25 DIAGNOSIS — Y999 Unspecified external cause status: Secondary | ICD-10-CM | POA: Diagnosis not present

## 2019-11-25 DIAGNOSIS — Z7984 Long term (current) use of oral hypoglycemic drugs: Secondary | ICD-10-CM | POA: Diagnosis not present

## 2019-11-25 DIAGNOSIS — I509 Heart failure, unspecified: Secondary | ICD-10-CM | POA: Insufficient documentation

## 2019-11-25 DIAGNOSIS — Z79899 Other long term (current) drug therapy: Secondary | ICD-10-CM | POA: Diagnosis not present

## 2019-11-25 DIAGNOSIS — Z9884 Bariatric surgery status: Secondary | ICD-10-CM | POA: Diagnosis not present

## 2019-11-25 DIAGNOSIS — S4992XA Unspecified injury of left shoulder and upper arm, initial encounter: Secondary | ICD-10-CM | POA: Diagnosis present

## 2019-11-25 DIAGNOSIS — Z87891 Personal history of nicotine dependence: Secondary | ICD-10-CM | POA: Diagnosis not present

## 2019-11-25 DIAGNOSIS — Z7982 Long term (current) use of aspirin: Secondary | ICD-10-CM | POA: Insufficient documentation

## 2019-11-25 DIAGNOSIS — W57XXXA Bitten or stung by nonvenomous insect and other nonvenomous arthropods, initial encounter: Secondary | ICD-10-CM | POA: Insufficient documentation

## 2019-11-25 DIAGNOSIS — Z955 Presence of coronary angioplasty implant and graft: Secondary | ICD-10-CM | POA: Insufficient documentation

## 2019-11-25 DIAGNOSIS — Y939 Activity, unspecified: Secondary | ICD-10-CM | POA: Insufficient documentation

## 2019-11-25 MED ORDER — FAMOTIDINE 20 MG PO TABS
20.0000 mg | ORAL_TABLET | Freq: Once | ORAL | Status: AC
  Administered 2019-11-25: 20 mg via ORAL
  Filled 2019-11-25: qty 1

## 2019-11-25 MED ORDER — CETIRIZINE HCL 10 MG PO TABS: 10.0000 mg | ORAL_TABLET | Freq: Every day | ORAL | 0 refills | Status: DC

## 2019-11-25 MED ORDER — DIPHENHYDRAMINE HCL 12.5 MG/5ML PO ELIX
12.5000 mg | ORAL_SOLUTION | Freq: Once | ORAL | Status: AC
  Administered 2019-11-25: 04:00:00 12.5 mg via ORAL
  Filled 2019-11-25: qty 10

## 2019-11-25 NOTE — ED Triage Notes (Signed)
Pt arrives to ED from home w/ c/o insect bite to L upper arm. Unknown insect type. Pt denies pt but endorses itchiness. NAD at this time.

## 2019-11-25 NOTE — ED Provider Notes (Signed)
[facility] EMERGENCY DEPARTMENT Provider Note   CSN: [id number] Arrival date & time: 11/25/19  0316     History Chief Complaint  Patient presents with  . Insect Bite    Patricia Cuevas is a 69 y.o. female.  The history is provided by the patient.  Animal Bite Contact animal:  Insect Location:  Shoulder/arm Shoulder/arm injury location:  L upper arm Time since incident:  2 hours Pain details:    Quality:  Itching   Severity:  Mild   Timing:  Constant   Progression:  Unchanged Incident location:  Another residence Provoked: unprovoked   Notifications:  None Animal in possession: no   Relieved by:  Nothing Worsened by:  Nothing Ineffective treatments:  None tried Associated symptoms: no fever and no numbness   Bitten by a small bug at a friend's house. Now has an itchy spot on LUE.       Past Medical History:  Diagnosis Date  . Alcohol abuse    in the past, resolved  . Arthritis   . Asthma   . Blood transfusion without reported diagnosis 1984   after surgery  . Breast cancer [facility])    Double mastectomy 2007; s/p tamoxifen and arimidex therapy; no recurrence since 05/2008  . CAD (coronary artery disease)    Nuclear stress test Feb 11, 2011, EF 64%, no scar or ischemia    //         catheterization, November, 2011,patent LIMA-LAD-small caliber distal vessel with diffuse plaque, patent SVG to OM1 and OM 2, patent SVG to PDA and PLA, occluded SVG to diagonal, medical therapy;  Lexiscan Myoview 6/14:  Inferior thinning, no ischemia, EF 61%, low risk  . CHF (congestive heart failure) ([place])   . Chronic back pain   . Diabetes mellitus   . Dyslipidemia   . Ejection fraction    EF 65%, echo, [place], Jan 16, 2011  . Hx of CABG    2007  . Hx of colonic polyps   . Hyperlipidemia   . Hypertension   . Myocardial infarction ([place])   . Nausea & vomiting    hospitalization, November, 2011, stricture GE junction, questionable stenosis gastrojejunostomy,  disruptive primary peristaltic wave, GE reflux, delayed emptying proximal gastric pouch  . Normal cardiac stress test 02/2013  . Overweight(278.02)    stomach stapling 1985 followed by weight loss, then return of weight  . Sciatica   . Sleep apnea    CPAP being arranged May, 2011//does not use CPAP  . Tobacco abuse    in the past, resolved  . UTI (urinary tract infection)     Patient Active Problem List   Diagnosis Date Noted  . Asthma 03/07/2017  . Sleep apnea 03/07/2017  . Diabetes mellitus type 2, controlled ([place]) 03/07/2017  . Hyperlipidemia 03/07/2017  . CAD (coronary artery disease) with prior CABG 03/07/2017  . UTI (urinary tract infection) 03/07/2017  . Sepsis ([place]) 03/07/2017  . Sepsis due to urinary tract infection ([place]) 03/07/2017  . HTN (hypertension) 03/07/2017  . Constipation by delayed colonic transit 03/07/2017  . Abnormal nuclear stress test 12/20/2015  . Angina, class III ([place]) 12/20/2015  . Chest pain 11/12/2014  . Upper GI bleed 03/04/2013  . Atypical chest pain 01/07/2013  . History of tobacco abuse 01/07/2013  . SIRS (systemic inflammatory response syndrome) ([place]) 07/28/2012  . Tachycardia 07/28/2012  . GERD (gastroesophageal reflux disease) 02/24/2011  . Coronary artery disease involving native coronary artery with angina pectoris (  [place])   . Hx of CABG   . Overweight   . OSA (obstructive sleep apnea)   . Nausea & vomiting   . Breast cancer ([place])   . Hypertension   . Diabetes mellitus ([place])   . Dyslipidemia   . Ejection fraction     Past Surgical History:  Procedure Laterality Date  . ABDOMINAL HYSTERECTOMY  1989  . CARDIAC CATHETERIZATION N/A 12/20/2015   Procedure: Left Heart Cath and Cors/Grafts Angiography;  Surgeon: Leonie Man, MD;  Location: [facility];  Service: Cardiovascular;  Laterality: N/A;  . CORONARY ARTERY BYPASS GRAFT  2009  . CORONARY STENT PLACEMENT    . GASTRIC BYPASS  1983   open.  [place]  . MASTECTOMY  Bilateral 1987     OB History   No obstetric history on file.     Family History  Problem Relation Age of Onset  . Hypertension Mother   . Heart disease Mother   . Diabetes Mother   . Heart disease Father   . Colon cancer Father        does not know age of onset  . Heart disease Sister   . Colon cancer Sister 33  . Stomach cancer Sister   . Diabetes Sister   . Asthma Sister   . Diabetes Sister   . Esophageal cancer Neg Hx   . Rectal cancer Neg Hx     Social History   Tobacco Use  . Smoking status: Former Smoker    Packs/day: 3.00    Years: 27.00    Pack years: 81.00    Types: Cigarettes    Quit date: 09/14/2000    Years since quitting: 19.2  . Smokeless tobacco: Never Used  Substance Use Topics  . Alcohol use: No  . Drug use: No    Home Medications Prior to Admission medications   Medication Sig Start Date End Date Taking? Authorizing Provider  acetaminophen (TYLENOL) 500 MG tablet Take 1 tablet (500 mg total) every 6 (six) hours as needed by mouth. 07/27/17   Kirichenko, Tatyana, PA-C  albuterol (PROVENTIL HFA;VENTOLIN HFA) 108 (90 BASE) MCG/ACT inhaler Inhale 2 puffs into the lungs every 6 (six) hours as needed for shortness of breath.     [provider]  aspirin EC 81 MG tablet Take 81 mg by mouth daily.     [provider]  cetirizine (ZYRTEC) 10 MG tablet Take 1 tablet (10 mg total) by mouth daily. 08/16/17   Maczis, Barth Kirks, PA-C  cholecalciferol (VITAMIN D3) 25 MCG (1000 UT) tablet Take 1,000 Units by mouth daily.    [provider]  fluticasone (FLONASE) 50 MCG/ACT nasal spray Place 2 sprays into both nostrils daily. 08/16/17   Maczis, Barth Kirks, PA-C  glipiZIDE (GLUCOTROL XL) 10 MG 24 hr tablet Take 1 tablet (10 mg total) by mouth daily with breakfast. 12/17/15   [name], NP  HYDROcodone-acetaminophen (NORCO) 10-325 MG tablet Take 1 tablet by mouth 2 (two) times daily as needed for moderate pain.    [provider]   latanoprost (XALATAN) 0.005 % ophthalmic solution Place 1 drop into both eyes at bedtime.    [provider]  lisinopril-hydrochlorothiazide (PRINZIDE,ZESTORETIC) 20-12.5 MG per tablet Take 1 tablet by mouth daily.      [provider]  metoprolol tartrate (LOPRESSOR) 25 MG tablet TAKE 1 TABLET BY MOUTH 2 TIMES A DAY 10/09/19   [name], MD  montelukast (SINGULAIR) 10 MG tablet  Take 10 mg by mouth daily. 08/14/19   [provider]  naproxen (NAPROSYN) 375 MG tablet Take 1 tablet (375 mg total) by mouth 2 (two) times daily. 0000000   [name], MD  nitroGLYCERIN (NITROSTAT) 0.4 MG SL tablet Place 1 tablet (0.4 mg total) under the tongue every 5 (five) minutes as needed for chest pain. 01/15/17   [name], MD  rosuvastatin (CRESTOR) 10 MG tablet Take 1 tablet (10 mg total) by mouth at bedtime. 08/26/18   [name], MD    Allergies    Latex  Review of Systems   Review of Systems  Constitutional: Negative for fever.  HENT: Negative for congestion.   Eyes: Negative for visual disturbance.  Respiratory: Negative for shortness of breath.   Cardiovascular: Negative for chest pain.  Gastrointestinal: Negative for abdominal pain.  Genitourinary: Negative for difficulty urinating.  Musculoskeletal: Negative for arthralgias.  Skin: Negative for wound.  Neurological: Negative for numbness.  Psychiatric/Behavioral: Negative for agitation.  All other systems reviewed and are negative.   Physical Exam Updated Vital Signs BP (!) 148/74 (BP Location: Right Arm)   Pulse 71   Temp 98.4 F (36.9 C) (Oral)   Resp 18   SpO2 95%   Physical Exam Vitals and nursing note reviewed.  Constitutional:      Appearance: Normal appearance.  HENT:     Head: Normocephalic and atraumatic.     Nose: Nose normal.  Eyes:     Conjunctiva/sclera: Conjunctivae normal.     Pupils: Pupils are equal, round, and reactive to light.  Cardiovascular:     Rate and Rhythm:  Normal rate and regular rhythm.     Pulses: Normal pulses.     Heart sounds: Normal heart sounds.  Pulmonary:     Effort: Pulmonary effort is normal.     Breath sounds: Normal breath sounds.  Abdominal:     General: Abdomen is flat. Bowel sounds are normal.     Tenderness: There is no abdominal tenderness. There is no guarding.  Musculoskeletal:        General: Normal range of motion.     Cervical back: Normal range of motion and neck supple.  Skin:    General: Skin is warm and dry.     Capillary Refill: Capillary refill takes less than 2 seconds.       Neurological:     General: No focal deficit present.     Mental Status: She is alert and oriented to person, place, and time.     Deep Tendon Reflexes: Reflexes normal.  Psychiatric:        Mood and Affect: Mood normal.        Behavior: Behavior normal.     ED Results / Procedures / Treatments   Labs (all labs ordered are listed, but only abnormal results are displayed) Labs Reviewed - No data to display  EKG None  Radiology No results found.  Procedures Procedures (including critical care time)  Medications Ordered in ED Medications  famotidine (PEPCID) tablet 20 mg (has no administration in time range)  diphenhydrAMINE (BENADRYL) 12.5 MG/5ML elixir 12.5 mg (has no administration in time range)    ED Course  I have reviewed the triage vital signs and the nursing notes.  Pertinent labs & imaging results that were available during my care of the patient were reviewed by me and considered in my medical decision making (see chart for details).    No signs of allergic reaction.  Zyrtec at home.  Keep the area clean and dry.    Patricia Cuevas was evaluated in Emergency Department on 11/25/2019 for the symptoms described in the history of present illness. She was evaluated in the context of the global COVID-19 pandemic, which necessitated consideration that the patient might be at risk for infection with the  SARS-CoV-2 virus that causes COVID-19. Institutional protocols and algorithms that pertain to the evaluation of patients at risk for COVID-19 are in a state of rapid change based on information released by regulatory bodies including the CDC and federal and state organizations. These policies and algorithms were followed during the patient's care in the ED.  Final Clinical Impression(s) / ED Diagnoses Return for weakness, numbness, changes in vision or speech, fevers >100.4 unrelieved by medication, shortness of breath, intractable vomiting, or diarrhea, abdominal pain, Inability to tolerate liquids or food, cough, altered mental status or any concerns. No signs of systemic illness or infection. The patient is nontoxic-appearing on exam and vital signs are within normal limits.   I have reviewed the triage vital signs and the nursing notes. Pertinent labs &imaging results that were available during my care of the patient were reviewed by me and considered in my medical decision making (see chart for details).  After history, exam, and medical workup I feel the patient has been appropriately medically screened and is safe for discharge home. Pertinent diagnoses were discussed with the patient. Patient was given return precautions    Maurisha Mongeau, MD 11/25/19 0401

## 2019-12-02 ENCOUNTER — Emergency Department (HOSPITAL_COMMUNITY)
Admission: EM | Admit: 2019-12-02 | Discharge: 2019-12-02 | Disposition: A | Discharge status: Home / Self Care | Payer: Medicare Other | Attending: Emergency Medicine | Admitting: Emergency Medicine

## 2019-12-02 ENCOUNTER — Encounter (HOSPITAL_COMMUNITY): Payer: Self-pay | Admitting: *Deleted

## 2019-12-02 ENCOUNTER — Emergency Department (HOSPITAL_COMMUNITY): Payer: Medicare Other

## 2019-12-02 ENCOUNTER — Other Ambulatory Visit: Payer: Self-pay

## 2019-12-02 DIAGNOSIS — I110 Hypertensive heart disease with heart failure: Secondary | ICD-10-CM | POA: Insufficient documentation

## 2019-12-02 DIAGNOSIS — Z853 Personal history of malignant neoplasm of breast: Secondary | ICD-10-CM | POA: Insufficient documentation

## 2019-12-02 DIAGNOSIS — Z79899 Other long term (current) drug therapy: Secondary | ICD-10-CM | POA: Diagnosis not present

## 2019-12-02 DIAGNOSIS — R682 Dry mouth, unspecified: Secondary | ICD-10-CM | POA: Insufficient documentation

## 2019-12-02 DIAGNOSIS — Z7984 Long term (current) use of oral hypoglycemic drugs: Secondary | ICD-10-CM | POA: Insufficient documentation

## 2019-12-02 DIAGNOSIS — R202 Paresthesia of skin: Secondary | ICD-10-CM | POA: Insufficient documentation

## 2019-12-02 DIAGNOSIS — Z91040 Latex allergy status: Secondary | ICD-10-CM | POA: Diagnosis not present

## 2019-12-02 DIAGNOSIS — Z955 Presence of coronary angioplasty implant and graft: Secondary | ICD-10-CM | POA: Diagnosis not present

## 2019-12-02 DIAGNOSIS — Z87891 Personal history of nicotine dependence: Secondary | ICD-10-CM | POA: Diagnosis not present

## 2019-12-02 DIAGNOSIS — Z951 Presence of aortocoronary bypass graft: Secondary | ICD-10-CM | POA: Insufficient documentation

## 2019-12-02 DIAGNOSIS — I509 Heart failure, unspecified: Secondary | ICD-10-CM | POA: Diagnosis not present

## 2019-12-02 DIAGNOSIS — I2510 Atherosclerotic heart disease of native coronary artery without angina pectoris: Secondary | ICD-10-CM | POA: Diagnosis not present

## 2019-12-02 DIAGNOSIS — J45909 Unspecified asthma, uncomplicated: Secondary | ICD-10-CM | POA: Diagnosis not present

## 2019-12-02 DIAGNOSIS — R0602 Shortness of breath: Secondary | ICD-10-CM

## 2019-12-02 DIAGNOSIS — E119 Type 2 diabetes mellitus without complications: Secondary | ICD-10-CM | POA: Insufficient documentation

## 2019-12-02 DIAGNOSIS — R064 Hyperventilation: Secondary | ICD-10-CM | POA: Diagnosis not present

## 2019-12-02 DIAGNOSIS — Z7982 Long term (current) use of aspirin: Secondary | ICD-10-CM | POA: Diagnosis not present

## 2019-12-02 LAB — TROPONIN I (HIGH SENSITIVITY): Troponin I (High Sensitivity): 4 ng/L (ref <18)

## 2019-12-02 LAB — BASIC METABOLIC PANEL
Anion gap: 12 (ref 5–15)
BUN: 9 mg/dL (ref 8–23)
CO2: 23 mmol/L (ref 22–32)
Calcium: 9.5 mg/dL (ref 8.9–10.3)
Chloride: 102 mmol/L (ref 98–111)
Creatinine, Ser: 0.8 mg/dL (ref 0.44–1.00)
GFR calc Af Amer: >60 mL/min (ref >60)
GFR calc non Af Amer: >60 mL/min (ref >60)
Glucose, Bld: 179 mg/dL — ABNORMAL HIGH (ref 70–99)
Potassium: 4.1 mmol/L (ref 3.5–5.1)
Sodium: 137 mmol/L (ref 135–145)

## 2019-12-02 LAB — CBC
HCT: 43 % (ref 36.0–46.0)
Hemoglobin: 13.5 g/dL (ref 12.0–15.0)
MCH: 28.3 pg (ref 26.0–34.0)
MCHC: 31.4 g/dL (ref 30.0–36.0)
MCV: 90.1 fL (ref 80.0–100.0)
Platelets: 202 10*3/uL (ref 150–400)
RBC: 4.77 MIL/uL (ref 3.87–5.11)
RDW: 14.7 % (ref 11.5–15.5)
WBC: 5.7 10*3/uL (ref 4.0–10.5)
nRBC: 0 % (ref 0.0–0.2)

## 2019-12-02 LAB — CBG MONITORING, ED: Glucose-Capillary: 167 mg/dL — ABNORMAL HIGH (ref 70–99)

## 2019-12-02 MED ORDER — SODIUM CHLORIDE 0.9% FLUSH: 3.0000 mL | Freq: Once | INTRAVENOUS | Status: DC

## 2019-12-02 NOTE — ED Provider Notes (Signed)
Patricia Cuevas   CSN: [id number] Arrival date & time: 12/02/19  0408     History Chief Complaint  Patient presents with  . Shortness of Breath    Patricia Cuevas is a 69 y.o. female.  69 year old female with a history of CAD (status post CABG), HTN, DM, HLD, CHF, chronic pain, obesity presents to the emergency department for shortness of breath.  She states that she woke up with a dry mouth at 2 AM.  She states that she felt as though her lips or dry and numb.  She drank some water which somewhat improved these symptoms, but began to also feel short of breath.  States that she was hyperventilating; breathing more rapidly.  Her symptoms have completely subsided, spontaneously.  She denies any associated syncope, near syncope, fevers, chest pain, leg swelling, hemoptysis, cough, vomiting.  She has been compliant with her medications.  The history is provided by the patient. No language interpreter was used.  Shortness of Breath      Past Medical History:  Diagnosis Date  . Alcohol abuse    in the past, resolved  . Arthritis   . Asthma   . Blood transfusion without reported diagnosis 1984   after surgery  . Breast cancer [facility])    Double mastectomy 2007; s/p tamoxifen and arimidex therapy; no recurrence since 05/2008  . CAD (coronary artery disease)    Nuclear stress test Feb 11, 2011, EF 64%, no scar or ischemia    //         catheterization, November, 2011,patent LIMA-LAD-small caliber distal vessel with diffuse plaque, patent SVG to OM1 and OM 2, patent SVG to PDA and PLA, occluded SVG to diagonal, medical therapy;  Lexiscan Myoview 6/14:  Inferior thinning, no ischemia, EF 61%, low risk  . CHF (congestive heart failure) ([place])   . Chronic back pain   . Diabetes mellitus   . Dyslipidemia   . Ejection fraction    EF 65%, echo, [place], Jan 16, 2011  . Hx of CABG    2007  . Hx of colonic polyps   . Hyperlipidemia   .  Hypertension   . Myocardial infarction ([place])   . Nausea & vomiting    hospitalization, November, 2011, stricture GE junction, questionable stenosis gastrojejunostomy, disruptive primary peristaltic wave, GE reflux, delayed emptying proximal gastric pouch  . Normal cardiac stress test 02/2013  . Overweight(278.02)    stomach stapling 1985 followed by weight loss, then return of weight  . Sciatica   . Sleep apnea    CPAP being arranged May, 2011//does not use CPAP  . Tobacco abuse    in the past, resolved  . UTI (urinary tract infection)     Patient Active Problem List   Diagnosis Date Noted  . Asthma 03/07/2017  . Sleep apnea 03/07/2017  . Diabetes mellitus type 2, controlled ([place]) 03/07/2017  . Hyperlipidemia 03/07/2017  . CAD (coronary artery disease) with prior CABG 03/07/2017  . UTI (urinary tract infection) 03/07/2017  . Sepsis ([place]) 03/07/2017  . Sepsis due to urinary tract infection ([place]) 03/07/2017  . HTN (hypertension) 03/07/2017  . Constipation by delayed colonic transit 03/07/2017  . Abnormal nuclear stress test 12/20/2015  . Angina, class III ([place]) 12/20/2015  . Chest pain 11/12/2014  . Upper GI bleed 03/04/2013  . Atypical chest pain 01/07/2013  . History of tobacco abuse 01/07/2013  . SIRS (systemic inflammatory response syndrome) ([place]) 07/28/2012  . Tachycardia  07/28/2012  . GERD (gastroesophageal reflux disease) 02/24/2011  . Coronary artery disease involving native coronary artery with angina pectoris ([place])   . Hx of CABG   . Overweight   . OSA (obstructive sleep apnea)   . Nausea & vomiting   . Breast cancer ([place])   . Hypertension   . Diabetes mellitus ([place])   . Dyslipidemia   . Ejection fraction     Past Surgical History:  Procedure Laterality Date  . ABDOMINAL HYSTERECTOMY  1989  . CARDIAC CATHETERIZATION N/A 12/20/2015   Procedure: Left Heart Cath and Cors/Grafts Angiography;  Surgeon: Leonie Man, MD;  Location: [place] [facility];  Service:  Cardiovascular;  Laterality: N/A;  . CORONARY ARTERY BYPASS GRAFT  2009  . CORONARY STENT PLACEMENT    . GASTRIC BYPASS  1983   open.  [place]  . MASTECTOMY Bilateral 1987     OB History   No obstetric history on file.     Family History  Problem Relation Age of Onset  . Hypertension Mother   . Heart disease Mother   . Diabetes Mother   . Heart disease Father   . Colon cancer Father        does not know age of onset  . Heart disease Sister   . Colon cancer Sister 61  . Stomach cancer Sister   . Diabetes Sister   . Asthma Sister   . Diabetes Sister   . Esophageal cancer Neg Hx   . Rectal cancer Neg Hx     Social History   Tobacco Use  . Smoking status: Former Smoker    Packs/day: 3.00    Years: 27.00    Pack years: 81.00    Types: Cigarettes    Quit date: 09/14/2000    Years since quitting: 19.2  . Smokeless tobacco: Never Used  Substance Use Topics  . Alcohol use: No  . Drug use: No    Home Medications Prior to Admission medications   Medication Sig Start Date End Date Taking? Authorizing Provider  acetaminophen (TYLENOL) 500 MG tablet Take 1 tablet (500 mg total) every 6 (six) hours as needed by mouth. 07/27/17   Kirichenko, Tatyana, PA-C  albuterol (PROVENTIL HFA;VENTOLIN HFA) 108 (90 BASE) MCG/ACT inhaler Inhale 2 puffs into the lungs every 6 (six) hours as needed for shortness of breath.     [provider]  aspirin EC 81 MG tablet Take 81 mg by mouth daily.     [provider]  cetirizine (ZYRTEC ALLERGY) 10 MG tablet Take 1 tablet (10 mg total) by mouth daily. 11/25/19   Palumbo, April, MD  cetirizine (ZYRTEC) 10 MG tablet Take 1 tablet (10 mg total) by mouth daily. 08/16/17   Maczis, Barth Kirks, PA-C  cholecalciferol (VITAMIN D3) 25 MCG (1000 UT) tablet Take 1,000 Units by mouth daily.    [provider]  fluticasone (FLONASE) 50 MCG/ACT nasal spray Place 2 sprays into both nostrils daily. 08/16/17   Maczis, Barth Kirks, PA-C    glipiZIDE (GLUCOTROL XL) 10 MG 24 hr tablet Take 1 tablet (10 mg total) by mouth daily with breakfast. 12/17/15   [name], NP  HYDROcodone-acetaminophen (NORCO) 10-325 MG tablet Take 1 tablet by mouth 2 (two) times daily as needed for moderate pain.    [provider]  latanoprost (XALATAN) 0.005 % ophthalmic solution Place 1 drop into both eyes at bedtime.    [provider]  lisinopril-hydrochlorothiazide (PRINZIDE,ZESTORETIC) 20-12.5 MG per tablet  Take 1 tablet by mouth daily.      [provider]  metoprolol tartrate (LOPRESSOR) 25 MG tablet TAKE 1 TABLET BY MOUTH 2 TIMES A DAY 10/09/19   [name], MD  montelukast (SINGULAIR) 10 MG tablet Take 10 mg by mouth daily. 08/14/19   [provider]  naproxen (NAPROSYN) 375 MG tablet Take 1 tablet (375 mg total) by mouth 2 (two) times daily. 0000000   [name], MD  nitroGLYCERIN (NITROSTAT) 0.4 MG SL tablet Place 1 tablet (0.4 mg total) under the tongue every 5 (five) minutes as needed for chest pain. 01/15/17   [name], MD  rosuvastatin (CRESTOR) 10 MG tablet Take 1 tablet (10 mg total) by mouth at bedtime. 08/26/18   [name], MD    Allergies    Latex  Review of Systems   Review of Systems  Respiratory: Positive for shortness of breath.   Ten systems reviewed and are negative for acute change, except as noted in the HPI.    Physical Exam Updated Vital Signs BP 111/66   Pulse 61   Temp 98.4 F (36.9 C) (Oral)   Resp (!) 0   Ht 4\' 9"  (1.448 m)   Wt 96.2 kg   SpO2 99%   BMI 45.88 kg/m   Physical Exam Vitals and nursing Cuevas reviewed.  Constitutional:      General: She is not in acute distress.    Appearance: She is well-developed. She is not diaphoretic.     Comments: Obese AA female. Pleasant.  HENT:     Head: Normocephalic and atraumatic.  Eyes:     General: No scleral icterus.    Conjunctiva/sclera: Conjunctivae normal.  Cardiovascular:     Rate and Rhythm:  Normal rate and regular rhythm.     Pulses: Normal pulses.  Pulmonary:     Effort: Pulmonary effort is normal. No respiratory distress.     Breath sounds: No stridor. No wheezing.     Comments: Lungs CTAB. Respirations even and unlabored. Musculoskeletal:        General: Normal range of motion.     Cervical back: Normal range of motion.     Comments: No significant BLE pitting edema.  Skin:    General: Skin is warm and dry.     Coloration: Skin is not pale.     Findings: No erythema or rash.  Neurological:     General: No focal deficit present.     Mental Status: She is alert and oriented to person, place, and time.     Coordination: Coordination normal.     Comments: GCS 15. Moving all extremities spontaneously.  Psychiatric:        Behavior: Behavior normal.     ED Results / Procedures / Treatments   Labs (all labs ordered are listed, but only abnormal results are displayed) Labs Reviewed  BASIC METABOLIC PANEL - Abnormal; Notable for the following components:      Result Value   Glucose, Bld 179 (*)    All other components within normal limits  CBG MONITORING, ED - Abnormal; Notable for the following components:   Glucose-Capillary 167 (*)    All other components within normal limits  CBC  TROPONIN I (HIGH SENSITIVITY)    EKG EKG Interpretation  Date/Time:  Saturday December 02 2019 04:17:20 EDT Ventricular Rate:  69 PR Interval:  140 QRS Duration: 100 QT Interval:  376 QTC Calculation: 402 R Axis:   2 Text Interpretation: Normal  sinus rhythm with sinus arrhythmia Nonspecific T wave abnormality Abnormal ECG No significant change since last tracing Confirmed by Orpah Greek [phone]) on 12/02/2019 5:46:37 AM   Radiology DG Chest 2 View  Result Date: 12/02/2019 CLINICAL DATA:  Shortness of breath EXAM: CHEST - 2 VIEW COMPARISON:  06/05/2019 FINDINGS: Elevated right hemidiaphragm, unchanged. No focal airspace consolidation. Remote median sternotomy. Right  axillary surgical clips. IMPRESSION: No active cardiopulmonary disease. Electronically Signed   By: Ulyses Jarred M.D.   On: 12/02/2019 04:47    Procedures Procedures (including critical care time)  Medications Ordered in ED Medications  sodium chloride flush (NS) 0.9 % injection 3 mL (has no administration in time range)    ED Course  I have reviewed the triage vital signs and the nursing notes.  Pertinent labs & imaging results that were available during my care of the patient were reviewed by me and considered in my medical decision making (see chart for details).    MDM Rules/Calculators/A&P                      69 year old female presents to the emergency department for evaluation of dry mouth, tingling of her lips, and shortness of breath.  Her symptoms sound most consistent with anxiety which woke her from sleep.  She states that her symptoms have completely resolved.  No longer complaining of shortness of breath.  She has a reassuring physical exam.  EKG without signs of acute ischemia.  She does have a history of CAD and is post CABG.  Plan for troponin to rule out ACS equivalent.  I anticipate this to be negative.  If work-up remains reassuring, feel she is appropriate for discharge and outpatient primary care follow-up.  Patient signed out to [place], PA-C at shift change who will assume care and disposition appropriately.   Final Clinical Impression(s) / ED Diagnoses Final diagnoses:  Shortness of breath    Rx / DC Orders ED Discharge Orders    None       Antonietta Breach, PA-C 12/02/19 [id number]    Orpah Greek, MD 12/02/19 [phone]

## 2019-12-02 NOTE — Discharge Instructions (Addendum)
Your blood tests looked good today. Your chest x-ray was clear and your EKG didn't show any new problems with your heart rhythm. Come back to the ED if you have worsening chest pain, shortness of breath, or other concerns. Otherwise see your doctor next week for a recheck.

## 2019-12-02 NOTE — ED Provider Notes (Signed)
6:55 AM Signout from [facility] at shift change.   Patient here with an episode of shortness of breath and facial numbness after hyperventilation spell.  Her symptoms are completely resolved and she is feeling back to her baseline.  Awaiting troponin, plan for discharge to home if reassuring.  At this time, risk factors but symptoms atypical for ACS, stroke/TIA, dissection, aneurysm. She does not report any CP. Reports some vague lip numbness but no focal deficits and doubt TIA/stroke.   EKG personally reviewed.  Patient updated on results.  She states that she is feeling good now.  Patient with normal heart and lung sounds.  Gross normal neurological exam.  She states that she is comfortable discharged home.  Patient was counseled to return with severe chest pain, especially if the pain is crushing or pressure-like and spreads to the arms, back, neck, or jaw, or if they have sweating, nausea, or shortness of breath with the pain. They were encouraged to call 911 with these symptoms.   Patient counseled to return if they have weakness in their arms or legs, slurred speech, trouble walking or talking, confusion, trouble with their balance, or if they have any other concerns. Patient verbalizes understanding and agrees with plan.   BP 111/66   Pulse 61   Temp 98.4 F (36.9 C) (Oral)   Resp (!) 0   Ht 4\' 9"  (1.448 m)   Wt 96.2 kg   SpO2 99%   BMI 45.88 kg/m      Carlisle Cater, PA-C 12/02/19 [id number]    Orpah Greek, MD 12/02/19 [phone]

## 2019-12-02 NOTE — ED Triage Notes (Signed)
Pt arrives reporting she woke up with very dry mouth and shortness of breath. Denies any current pain, no focal weakness or asymmetry noted in triage.

## 2019-12-08 ENCOUNTER — Ambulatory Visit: Payer: Medicare Other | Attending: Internal Medicine

## 2019-12-08 DIAGNOSIS — Z23 Encounter for immunization: Secondary | ICD-10-CM

## 2019-12-08 NOTE — Progress Notes (Signed)
   Covid-19 Vaccination Clinic  Name:  Patricia Cuevas    MRN: [id number] DOB: September 23, 1950  12/08/2019  [name] was observed post Covid-19 immunization for 15 minutes without incident. She was provided with Vaccine Information Sheet and instruction to access the V-Safe system.   [name] was instructed to call 911 with any severe reactions post vaccine: [name] Kitchen Difficulty breathing  . Swelling of face and throat  . A fast heartbeat  . A bad rash all over body  . Dizziness and weakness   Immunizations Administered    Name Date Dose VIS Date Route   Pfizer COVID-19 Vaccine 12/08/2019 10:59 AM 0.3 mL 08/25/2019 Intramuscular   Manufacturer: [place]   Lot: [id number]   [place]: [id number]

## 2019-12-29 ENCOUNTER — Encounter (HOSPITAL_COMMUNITY): Payer: Self-pay | Admitting: Emergency Medicine

## 2019-12-29 ENCOUNTER — Emergency Department (HOSPITAL_COMMUNITY)
Admission: EM | Admit: 2019-12-29 | Discharge: 2019-12-30 | Disposition: A | Discharge status: Home / Self Care | Payer: Medicare Other | Attending: Emergency Medicine | Admitting: Emergency Medicine

## 2019-12-29 ENCOUNTER — Other Ambulatory Visit: Payer: Self-pay

## 2019-12-29 DIAGNOSIS — N390 Urinary tract infection, site not specified: Secondary | ICD-10-CM | POA: Diagnosis not present

## 2019-12-29 DIAGNOSIS — Z7984 Long term (current) use of oral hypoglycemic drugs: Secondary | ICD-10-CM | POA: Insufficient documentation

## 2019-12-29 DIAGNOSIS — E119 Type 2 diabetes mellitus without complications: Secondary | ICD-10-CM | POA: Diagnosis not present

## 2019-12-29 DIAGNOSIS — R300 Dysuria: Secondary | ICD-10-CM | POA: Diagnosis present

## 2019-12-29 DIAGNOSIS — R1030 Lower abdominal pain, unspecified: Secondary | ICD-10-CM | POA: Diagnosis not present

## 2019-12-29 NOTE — ED Triage Notes (Signed)
Pt states she is having lower abd pain and pain with urination.

## 2019-12-30 DIAGNOSIS — N390 Urinary tract infection, site not specified: Secondary | ICD-10-CM | POA: Diagnosis not present

## 2019-12-30 LAB — CBC WITH DIFFERENTIAL/PLATELET
Abs Immature Granulocytes: 0.01 10*3/uL (ref 0.00–0.07)
Basophils Absolute: 0 10*3/uL (ref 0.0–0.1)
Basophils Relative: 1 %
Eosinophils Absolute: 0.1 10*3/uL (ref 0.0–0.5)
Eosinophils Relative: 1 %
HCT: 44.4 % (ref 36.0–46.0)
Hemoglobin: 13.6 g/dL (ref 12.0–15.0)
Immature Granulocytes: 0 %
Lymphocytes Relative: 52 %
Lymphs Abs: 3.4 10*3/uL (ref 0.7–4.0)
MCH: 27.6 pg (ref 26.0–34.0)
MCHC: 30.6 g/dL (ref 30.0–36.0)
MCV: 90.1 fL (ref 80.0–100.0)
Monocytes Absolute: 0.5 10*3/uL (ref 0.1–1.0)
Monocytes Relative: 8 %
Neutro Abs: 2.5 10*3/uL (ref 1.7–7.7)
Neutrophils Relative %: 38 %
Platelets: 205 10*3/uL (ref 150–400)
RBC: 4.93 MIL/uL (ref 3.87–5.11)
RDW: 14.9 % (ref 11.5–15.5)
WBC: 6.6 10*3/uL (ref 4.0–10.5)
nRBC: 0 % (ref 0.0–0.2)

## 2019-12-30 LAB — URINALYSIS, ROUTINE W REFLEX MICROSCOPIC
Bilirubin Urine: NEGATIVE
Glucose, UA: NEGATIVE mg/dL
Ketones, ur: NEGATIVE mg/dL
Nitrite: NEGATIVE
Protein, ur: 100 mg/dL — AB
RBC / HPF: >50 RBC/hpf — ABNORMAL HIGH (ref 0–5)
Specific Gravity, Urine: 1.02 (ref 1.005–1.030)
WBC, UA: >50 WBC/hpf — ABNORMAL HIGH (ref 0–5)
pH: 6 (ref 5.0–8.0)

## 2019-12-30 LAB — BASIC METABOLIC PANEL
Anion gap: 9 (ref 5–15)
BUN: 9 mg/dL (ref 8–23)
CO2: 25 mmol/L (ref 22–32)
Calcium: 9.5 mg/dL (ref 8.9–10.3)
Chloride: 104 mmol/L (ref 98–111)
Creatinine, Ser: 0.84 mg/dL (ref 0.44–1.00)
GFR calc Af Amer: >60 mL/min (ref >60)
GFR calc non Af Amer: >60 mL/min (ref >60)
Glucose, Bld: 149 mg/dL — ABNORMAL HIGH (ref 70–99)
Potassium: 4.3 mmol/L (ref 3.5–5.1)
Sodium: 138 mmol/L (ref 135–145)

## 2019-12-30 MED ORDER — NITROFURANTOIN MONOHYD MACRO 100 MG PO CAPS: 100.0000 mg | ORAL_CAPSULE | Freq: Two times a day (BID) | ORAL | 0 refills | Status: DC

## 2019-12-30 MED ORDER — NITROFURANTOIN MONOHYD MACRO 100 MG PO CAPS
100.0000 mg | ORAL_CAPSULE | Freq: Once | ORAL | Status: AC
  Administered 2019-12-30: 07:00:00 100 mg via ORAL
  Filled 2019-12-30: qty 1

## 2019-12-30 NOTE — ED Provider Notes (Signed)
[facility] EMERGENCY DEPARTMENT Provider Note  CSN: [id number] Arrival date & time: 12/29/19 2301  Chief Complaint(s) Dysuria  HPI Patricia Cuevas is a 69 y.o. female who presents to the emergency department with 2 days of dysuria, urgency, frequency.  Patient reports this is similar to prior urinary tract infections.  Reports that is been several years since her last urinary tract infection.  Endorses mild suprapubic discomfort.  No nausea or vomiting.  No flank pain.  No fevers or chills.  Denies any other physical complaints.  HPI  Past Medical History Past Medical History:  Diagnosis Date  . Alcohol abuse    in the past, resolved  . Arthritis   . Asthma   . Blood transfusion without reported diagnosis 1984   after surgery  . Breast cancer [facility])    Double mastectomy 2007; s/p tamoxifen and arimidex therapy; no recurrence since 05/2008  . CAD (coronary artery disease)    Nuclear stress test Feb 11, 2011, EF 64%, no scar or ischemia    //         catheterization, November, 2011,patent LIMA-LAD-small caliber distal vessel with diffuse plaque, patent SVG to OM1 and OM 2, patent SVG to PDA and PLA, occluded SVG to diagonal, medical therapy;  Lexiscan Myoview 6/14:  Inferior thinning, no ischemia, EF 61%, low risk  . CHF (congestive heart failure) ([place])   . Chronic back pain   . Diabetes mellitus   . Dyslipidemia   . Ejection fraction    EF 65%, echo, [place], Jan 16, 2011  . Hx of CABG    2007  . Hx of colonic polyps   . Hyperlipidemia   . Hypertension   . Myocardial infarction ([place])   . Nausea & vomiting    hospitalization, November, 2011, stricture GE junction, questionable stenosis gastrojejunostomy, disruptive primary peristaltic wave, GE reflux, delayed emptying proximal gastric pouch  . Normal cardiac stress test 02/2013  . Overweight(278.02)    stomach stapling 1985 followed by weight loss, then return of weight  . Sciatica   . Sleep apnea    CPAP being arranged May, 2011//does not use CPAP  . Tobacco abuse    in the past, resolved  . UTI (urinary tract infection)    Patient Active Problem List   Diagnosis Date Noted  . Asthma 03/07/2017  . Sleep apnea 03/07/2017  . Diabetes mellitus type 2, controlled ([place]) 03/07/2017  . Hyperlipidemia 03/07/2017  . CAD (coronary artery disease) with prior CABG 03/07/2017  . UTI (urinary tract infection) 03/07/2017  . Sepsis ([place]) 03/07/2017  . Sepsis due to urinary tract infection ([place]) 03/07/2017  . HTN (hypertension) 03/07/2017  . Constipation by delayed colonic transit 03/07/2017  . Abnormal nuclear stress test 12/20/2015  . Angina, class III ([place]) 12/20/2015  . Chest pain 11/12/2014  . Upper GI bleed 03/04/2013  . Atypical chest pain 01/07/2013  . History of tobacco abuse 01/07/2013  . SIRS (systemic inflammatory response syndrome) ([place]) 07/28/2012  . Tachycardia 07/28/2012  . GERD (gastroesophageal reflux disease) 02/24/2011  . Coronary artery disease involving native coronary artery with angina pectoris ([place])   . Hx of CABG   . Overweight   . OSA (obstructive sleep apnea)   . Nausea & vomiting   . Breast cancer ([place])   . Hypertension   . Diabetes mellitus ([place])   . Dyslipidemia   . Ejection fraction    Home Medication(s) Prior to Admission medications   Medication Sig Start Date  End Date Taking? Authorizing Provider  acetaminophen (TYLENOL) 500 MG tablet Take 1 tablet (500 mg total) every 6 (six) hours as needed by mouth. 07/27/17   Kirichenko, Tatyana, PA-C  albuterol (PROVENTIL HFA;VENTOLIN HFA) 108 (90 BASE) MCG/ACT inhaler Inhale 2 puffs into the lungs every 6 (six) hours as needed for shortness of breath.     [provider]  aspirin EC 81 MG tablet Take 81 mg by mouth daily.     [provider]  cetirizine (ZYRTEC ALLERGY) 10 MG tablet Take 1 tablet (10 mg total) by mouth daily. Patient not taking: Reported on 12/02/2019 11/25/19   Palumbo, April,  MD  cetirizine (ZYRTEC) 10 MG tablet Take 1 tablet (10 mg total) by mouth daily. 08/16/17   Maczis, Barth Kirks, PA-C  cholecalciferol (VITAMIN D3) 25 MCG (1000 UT) tablet Take 1,000 Units by mouth daily.    [provider]  fluticasone (FLONASE) 50 MCG/ACT nasal spray Place 2 sprays into both nostrils daily. 08/16/17   Maczis, Barth Kirks, PA-C  glipiZIDE (GLUCOTROL XL) 10 MG 24 hr tablet Take 1 tablet (10 mg total) by mouth daily with breakfast. 12/17/15   [name], NP  latanoprost (XALATAN) 0.005 % ophthalmic solution Place 1 drop into both eyes at bedtime.    [provider]  lisinopril-hydrochlorothiazide (PRINZIDE,ZESTORETIC) 20-12.5 MG per tablet Take 1 tablet by mouth daily.      [provider]  metoprolol tartrate (LOPRESSOR) 25 MG tablet TAKE 1 TABLET BY MOUTH 2 TIMES A DAY Patient taking differently: Take 25 mg by mouth 2 (two) times daily.  10/09/19   [name], MD  montelukast (SINGULAIR) 10 MG tablet Take 10 mg by mouth daily. 08/14/19   [provider]  naproxen (NAPROSYN) 375 MG tablet Take 1 tablet (375 mg total) by mouth 2 (two) times daily. Patient not taking: Reported on [phone]   [name], MD  nitrofurantoin, macrocrystal-monohydrate, (MACROBID) 100 MG capsule Take 1 capsule (100 mg total) by mouth 2 (two) times daily. 12/30/19   Shreeya Recendiz, Grayce Sessions, MD  nitroGLYCERIN (NITROSTAT) 0.4 MG SL tablet Place 1 tablet (0.4 mg total) under the tongue every 5 (five) minutes as needed for chest pain. 01/15/17   [name], MD  rosuvastatin (CRESTOR) 10 MG tablet Take 1 tablet (10 mg total) by mouth at bedtime. 08/26/18   [name], MD                                                                                                                                    Past Surgical History Past Surgical History:  Procedure Laterality Date  . ABDOMINAL HYSTERECTOMY  1989  . CARDIAC CATHETERIZATION N/A 12/20/2015   Procedure:  Left Heart Cath and Cors/Grafts Angiography;  Surgeon: Leonie Man, MD;  Location: [facility];  Service: Cardiovascular;  Laterality: N/A;  . CORONARY ARTERY BYPASS GRAFT  2009  .  CORONARY STENT PLACEMENT    . GASTRIC BYPASS  1983   open.  [place]  . MASTECTOMY Bilateral 1987   Family History Family History  Problem Relation Age of Onset  . Hypertension Mother   . Heart disease Mother   . Diabetes Mother   . Heart disease Father   . Colon cancer Father        does not know age of onset  . Heart disease Sister   . Colon cancer Sister 42  . Stomach cancer Sister   . Diabetes Sister   . Asthma Sister   . Diabetes Sister   . Esophageal cancer Neg Hx   . Rectal cancer Neg Hx     Social History Social History   Tobacco Use  . Smoking status: Former Smoker    Packs/day: 3.00    Years: 27.00    Pack years: 81.00    Types: Cigarettes    Quit date: 09/14/2000    Years since quitting: 19.3  . Smokeless tobacco: Never Used  Substance Use Topics  . Alcohol use: No  . Drug use: No   Allergies Latex  Review of Systems Review of Systems All other systems are reviewed and are negative for acute change except as noted in the HPI  Physical Exam Vital Signs  I have reviewed the triage vital signs BP 115/60 (BP Location: Right Arm)   Pulse 61   Temp 98 F (36.7 C) (Oral)   Resp 18   Ht 4\' 9"  (1.448 m)   Wt 96.2 kg   SpO2 100%   BMI 45.89 kg/m   Physical Exam Vitals reviewed.  Constitutional:      General: She is not in acute distress.    Appearance: She is well-developed. She is not diaphoretic.  HENT:     Head: Normocephalic and atraumatic.     Right Ear: External ear normal.     Left Ear: External ear normal.     Nose: Nose normal.  Eyes:     General: No scleral icterus.    Conjunctiva/sclera: Conjunctivae normal.  Neck:     Trachea: Phonation normal.  Cardiovascular:     Rate and Rhythm: Normal rate and regular rhythm.  Pulmonary:      Effort: Pulmonary effort is normal. No respiratory distress.     Breath sounds: No stridor.  Abdominal:     General: There is no distension.     Tenderness: There is no abdominal tenderness.  Musculoskeletal:        General: Normal range of motion.     Cervical back: Normal range of motion.  Neurological:     Mental Status: She is alert and oriented to person, place, and time.  Psychiatric:        Behavior: Behavior normal.     ED Results and Treatments Labs (all labs ordered are listed, but only abnormal results are displayed) Labs Reviewed  URINALYSIS, ROUTINE W REFLEX MICROSCOPIC - Abnormal; Notable for the following components:      Result Value   APPearance CLOUDY (*)    Hgb urine dipstick LARGE (*)    Protein, ur 100 (*)    Leukocytes,Ua LARGE (*)    RBC / HPF >50 (*)    WBC, UA >50 (*)    Bacteria, UA RARE (*)    All other components within normal limits  BASIC METABOLIC PANEL - Abnormal; Notable for the following components:   Glucose, Bld 149 (*)    All other  components within normal limits  URINE CULTURE  CBC WITH DIFFERENTIAL/PLATELET                                                                                                                         EKG  EKG Interpretation  Date/Time:    Ventricular Rate:    PR Interval:    QRS Duration:   QT Interval:    QTC Calculation:   R Axis:     Text Interpretation:        Radiology No results found.  Pertinent labs & imaging results that were available during my care of the patient were reviewed by me and considered in my medical decision making (see chart for details).  Medications Ordered in ED Medications  nitrofurantoin (macrocrystal-monohydrate) (MACROBID) capsule 100 mg (100 mg Oral Given 12/30/19 0711)                                                                                                                                    Procedures Procedures  (including critical care time)  Medical  Decision Making / ED Course I have reviewed the nursing notes for this encounter and the patient's prior records (if available in EHR or on provided paperwork).   Patricia Cuevas was evaluated in Emergency Department on 12/30/2019 for the symptoms described in the history of present illness. She was evaluated in the context of the global COVID-19 pandemic, which necessitated consideration that the patient might be at risk for infection with the SARS-CoV-2 virus that causes COVID-19. Institutional protocols and algorithms that pertain to the evaluation of patients at risk for COVID-19 are in a state of rapid change based on information released by regulatory bodies including the CDC and federal and state organizations. These policies and algorithms were followed during the patient's care in the ED.  Work-up consistent with urinary tract infection.  No leukocytosis on CBC.  Patient is not septic.  No evidence of pyelonephritis.  On review of records, patient had urine culture from 2018 that grew out E. coli resistant to a number of antibiotics but susceptible to Macrobid.  We will start initial treatment with Macrobid and send urine for culture.      Final Clinical Impression(s) / ED Diagnoses Final diagnoses:  Lower urinary tract infectious disease   The patient appears reasonably screened and/or stabilized for discharge and I doubt any other medical condition or  other [place] requiring further screening, evaluation, or treatment in the ED at this time prior to discharge. Safe for discharge with strict return precautions.  Disposition: Discharge  Condition: Good  I have discussed the results, Dx and Tx plan with the patient/family who expressed understanding and agree(s) with the plan. Discharge instructions discussed at length. The patient/family was given strict return precautions who verbalized understanding of the instructions. No further questions at time of discharge.    ED Discharge Orders          Ordered    nitrofurantoin, macrocrystal-monohydrate, (MACROBID) 100 MG capsule  2 times daily     12/30/19 [id number]            Follow Up: [name], [address] [place] [address] [phone]  Schedule an appointment as soon as possible for a visit in 1 week       This chart was dictated using voice recognition software.  Despite best efforts to proofread,  errors can occur which can change the documentation meaning.   Fatima Blank, MD 12/30/19 [phone]

## 2019-12-30 NOTE — ED Notes (Signed)
Patient verbalized understanding of dc instructions, vss, taken out via wheelchair, with nad.

## 2020-01-01 ENCOUNTER — Ambulatory Visit: Payer: Medicare Other | Attending: Internal Medicine

## 2020-01-01 DIAGNOSIS — Z23 Encounter for immunization: Secondary | ICD-10-CM

## 2020-01-01 LAB — URINE CULTURE: Culture: >=100000 — AB

## 2020-01-01 NOTE — Progress Notes (Signed)
   Covid-19 Vaccination Clinic  Name:  AITZA FRANEY    MRN: [id number] DOB: 09/08/1951  01/01/2020  Ms. Koziol was observed post Covid-19 immunization for 15 minutes without incident. She was provided with Vaccine Information Sheet and instruction to access the V-Safe system.   Ms. Sollis was instructed to call 911 with any severe reactions post vaccine: [name] Kitchen Difficulty breathing  . Swelling of face and throat  . A fast heartbeat  . A bad rash all over body  . Dizziness and weakness   Immunizations Administered    Name Date Dose VIS Date Route   Pfizer COVID-19 Vaccine 01/01/2020  3:14 PM 0.3 mL 11/08/2018 Intramuscular   Manufacturer: [place]   Lot: [id number]   [place]: [id number]

## 2020-01-02 ENCOUNTER — Telehealth: Payer: Self-pay | Admitting: Emergency Medicine

## 2020-01-02 NOTE — Telephone Encounter (Signed)
Post ED Visit - Positive Culture Follow-up  Culture report reviewed by antimicrobial stewardship pharmacist: [place] Team []  Elenor Quinones, Pharm.D. []  [name], Pharm.D., BCPS AQ-ID []  Parks Neptune, Pharm.D., BCPS []  Alycia Rossetti, Pharm.D., BCPS []  [place], Pharm.D., BCPS, AAHIVP []  Legrand Como, Pharm.D., BCPS, AAHIVP []  Salome Arnt, PharmD, BCPS []  Johnnette Gourd, PharmD, BCPS []  Hughes Better, PharmD, BCPS []  Leeroy Cha, PharmD []  Laqueta Linden, PharmD, BCPS []  Albertina Parr, PharmD  [place] Team []  Leodis Sias, PharmD []  Lindell Spar, PharmD []  Royetta Asal, PharmD []  Graylin Shiver, Rph []  Rema Fendt) Glennon Mac, PharmD []  Arlyn Dunning, PharmD []  Netta Cedars, PharmD []  Dia Sitter, PharmD []  Leone Haven, PharmD []  Gretta Arab, PharmD []  Theodis Shove, PharmD []  Peggyann Juba, PharmD []  Reuel Boom, PharmD   Positive urine culture Treated with nitrofurantoin, organism sensitive to the same and no further patient follow-up is required at this time.  Hazle Nordmann 01/02/2020, 11:46 AM

## 2020-08-10 ENCOUNTER — Emergency Department (HOSPITAL_COMMUNITY)
Admission: EM | Admit: 2020-08-10 | Discharge: 2020-08-10 | Disposition: A | Discharge status: Home / Self Care | Payer: Medicare Other | Attending: Emergency Medicine | Admitting: Emergency Medicine

## 2020-08-10 ENCOUNTER — Emergency Department (HOSPITAL_COMMUNITY): Payer: Medicare Other

## 2020-08-10 ENCOUNTER — Encounter (HOSPITAL_COMMUNITY): Payer: Self-pay | Admitting: Emergency Medicine

## 2020-08-10 ENCOUNTER — Other Ambulatory Visit: Payer: Self-pay

## 2020-08-10 DIAGNOSIS — Z951 Presence of aortocoronary bypass graft: Secondary | ICD-10-CM | POA: Diagnosis not present

## 2020-08-10 DIAGNOSIS — Z853 Personal history of malignant neoplasm of breast: Secondary | ICD-10-CM | POA: Insufficient documentation

## 2020-08-10 DIAGNOSIS — Z7982 Long term (current) use of aspirin: Secondary | ICD-10-CM | POA: Insufficient documentation

## 2020-08-10 DIAGNOSIS — I509 Heart failure, unspecified: Secondary | ICD-10-CM | POA: Diagnosis not present

## 2020-08-10 DIAGNOSIS — I110 Hypertensive heart disease with heart failure: Secondary | ICD-10-CM | POA: Insufficient documentation

## 2020-08-10 DIAGNOSIS — Z79899 Other long term (current) drug therapy: Secondary | ICD-10-CM | POA: Insufficient documentation

## 2020-08-10 DIAGNOSIS — E119 Type 2 diabetes mellitus without complications: Secondary | ICD-10-CM | POA: Diagnosis not present

## 2020-08-10 DIAGNOSIS — I2510 Atherosclerotic heart disease of native coronary artery without angina pectoris: Secondary | ICD-10-CM | POA: Diagnosis not present

## 2020-08-10 DIAGNOSIS — Z91040 Latex allergy status: Secondary | ICD-10-CM | POA: Diagnosis not present

## 2020-08-10 DIAGNOSIS — R1030 Lower abdominal pain, unspecified: Secondary | ICD-10-CM | POA: Diagnosis present

## 2020-08-10 DIAGNOSIS — Z7984 Long term (current) use of oral hypoglycemic drugs: Secondary | ICD-10-CM | POA: Diagnosis not present

## 2020-08-10 DIAGNOSIS — J45909 Unspecified asthma, uncomplicated: Secondary | ICD-10-CM | POA: Insufficient documentation

## 2020-08-10 DIAGNOSIS — Z87891 Personal history of nicotine dependence: Secondary | ICD-10-CM | POA: Insufficient documentation

## 2020-08-10 DIAGNOSIS — R109 Unspecified abdominal pain: Secondary | ICD-10-CM

## 2020-08-10 DIAGNOSIS — N390 Urinary tract infection, site not specified: Secondary | ICD-10-CM | POA: Diagnosis not present

## 2020-08-10 DIAGNOSIS — Z955 Presence of coronary angioplasty implant and graft: Secondary | ICD-10-CM | POA: Diagnosis not present

## 2020-08-10 LAB — URINALYSIS, ROUTINE W REFLEX MICROSCOPIC
Bilirubin Urine: NEGATIVE
Glucose, UA: NEGATIVE mg/dL
Hgb urine dipstick: NEGATIVE
Ketones, ur: NEGATIVE mg/dL
Nitrite: NEGATIVE
Protein, ur: NEGATIVE mg/dL
Specific Gravity, Urine: 1.023 (ref 1.005–1.030)
pH: 5 (ref 5.0–8.0)

## 2020-08-10 LAB — COMPREHENSIVE METABOLIC PANEL
ALT: 14 U/L (ref 0–44)
AST: 23 U/L (ref 15–41)
Albumin: 3.6 g/dL (ref 3.5–5.0)
Alkaline Phosphatase: 59 U/L (ref 38–126)
Anion gap: 9 (ref 5–15)
BUN: 10 mg/dL (ref 8–23)
CO2: 26 mmol/L (ref 22–32)
Calcium: 9.3 mg/dL (ref 8.9–10.3)
Chloride: 104 mmol/L (ref 98–111)
Creatinine, Ser: 0.84 mg/dL (ref 0.44–1.00)
GFR, Estimated: >60 mL/min (ref >60)
Glucose, Bld: 188 mg/dL — ABNORMAL HIGH (ref 70–99)
Potassium: 3.9 mmol/L (ref 3.5–5.1)
Sodium: 139 mmol/L (ref 135–145)
Total Bilirubin: 0.6 mg/dL (ref 0.3–1.2)
Total Protein: 6.6 g/dL (ref 6.5–8.1)

## 2020-08-10 LAB — CBC
HCT: 43 % (ref 36.0–46.0)
Hemoglobin: 13.3 g/dL (ref 12.0–15.0)
MCH: 28.2 pg (ref 26.0–34.0)
MCHC: 30.9 g/dL (ref 30.0–36.0)
MCV: 91.1 fL (ref 80.0–100.0)
Platelets: 185 10*3/uL (ref 150–400)
RBC: 4.72 MIL/uL (ref 3.87–5.11)
RDW: 14.3 % (ref 11.5–15.5)
WBC: 6 10*3/uL (ref 4.0–10.5)
nRBC: 0 % (ref 0.0–0.2)

## 2020-08-10 LAB — LIPASE, BLOOD: Lipase: 33 U/L (ref 11–51)

## 2020-08-10 MED ORDER — PHENAZOPYRIDINE HCL 100 MG PO TABS
200.0000 mg | ORAL_TABLET | Freq: Once | ORAL | Status: AC
  Administered 2020-08-10: 200 mg via ORAL
  Filled 2020-08-10: qty 2

## 2020-08-10 MED ORDER — HYDROCODONE-ACETAMINOPHEN 5-325 MG PO TABS: 1.0000 | ORAL_TABLET | Freq: Four times a day (QID) | ORAL | 0 refills | Status: DC | PRN

## 2020-08-10 MED ORDER — SODIUM CHLORIDE 0.9 % IV SOLN
2.0000 g | Freq: Once | INTRAVENOUS | Status: AC
  Administered 2020-08-10: 2 g via INTRAVENOUS
  Filled 2020-08-10: qty 20

## 2020-08-10 MED ORDER — HYDROCODONE-ACETAMINOPHEN 5-325 MG PO TABS
1.0000 | ORAL_TABLET | Freq: Once | ORAL | Status: AC
  Administered 2020-08-10: 1 via ORAL
  Filled 2020-08-10: qty 1

## 2020-08-10 MED ORDER — PHENAZOPYRIDINE HCL 200 MG PO TABS: 200.0000 mg | ORAL_TABLET | Freq: Three times a day (TID) | ORAL | 0 refills | Status: DC | PRN

## 2020-08-10 MED ORDER — CEPHALEXIN 500 MG PO CAPS: 1000.0000 mg | ORAL_CAPSULE | Freq: Two times a day (BID) | ORAL | 0 refills | Status: DC

## 2020-08-10 MED ORDER — MORPHINE SULFATE (PF) 4 MG/ML IV SOLN
4.0000 mg | Freq: Once | INTRAVENOUS | Status: AC
  Administered 2020-08-10: 4 mg via INTRAVENOUS
  Filled 2020-08-10: qty 1

## 2020-08-10 NOTE — ED Triage Notes (Signed)
Pt reports L sided pelvic pain that radiates to L flank since yesterday.  Reports painful urination.  Also reports tingling to bilateral legs and feet.

## 2020-08-10 NOTE — ED Notes (Signed)
Patient transported to CT 

## 2020-08-10 NOTE — ED Provider Notes (Signed)
[facility] EMERGENCY DEPARTMENT Provider Note   CSN: [id number] Arrival date & time: 08/10/20  1553     History Chief Complaint  Patient presents with  . Flank Pain    Patricia Cuevas is a 69 y.o. female.  HPI Patient reports he started developing pain in her left groin area 2 days ago.  She denies any injury or strain pattern.  She describes pain along the groin that radiates from around her lower back and flank area.  Pain also seems to radiate somewhat down the front of the leg with a sharp quality.  She reports she is also felt urgency and pain with urination.  No fever no nausea no vomiting no diarrhea.  Patient reports she has history of a distant kidney stone.  She reports pain feels somewhat similar.  Pain is made worse by position changes and ambulating.  Nothing seems to improve it.    Past Medical History:  Diagnosis Date  . Alcohol abuse    in the past, resolved  . Arthritis   . Asthma   . Blood transfusion without reported diagnosis 1984   after surgery  . Breast cancer [facility])    Double mastectomy 2007; s/p tamoxifen and arimidex therapy; no recurrence since 05/2008  . CAD (coronary artery disease)    Nuclear stress test Feb 11, 2011, EF 64%, no scar or ischemia    //         catheterization, November, 2011,patent LIMA-LAD-small caliber distal vessel with diffuse plaque, patent SVG to OM1 and OM 2, patent SVG to PDA and PLA, occluded SVG to diagonal, medical therapy;  Lexiscan Myoview 6/14:  Inferior thinning, no ischemia, EF 61%, low risk  . CHF (congestive heart failure) ([place])   . Chronic back pain   . Diabetes mellitus   . Dyslipidemia   . Ejection fraction    EF 65%, echo, [place], Jan 16, 2011  . Hx of CABG    2007  . Hx of colonic polyps   . Hyperlipidemia   . Hypertension   . Myocardial infarction ([place])   . Nausea & vomiting    hospitalization, November, 2011, stricture GE junction, questionable stenosis gastrojejunostomy,  disruptive primary peristaltic wave, GE reflux, delayed emptying proximal gastric pouch  . Normal cardiac stress test 02/2013  . Overweight(278.02)    stomach stapling 1985 followed by weight loss, then return of weight  . Sciatica   . Sleep apnea    CPAP being arranged May, 2011//does not use CPAP  . Tobacco abuse    in the past, resolved  . UTI (urinary tract infection)     Patient Active Problem List   Diagnosis Date Noted  . Asthma 03/07/2017  . Sleep apnea 03/07/2017  . Diabetes mellitus type 2, controlled ([place]) 03/07/2017  . Hyperlipidemia 03/07/2017  . CAD (coronary artery disease) with prior CABG 03/07/2017  . UTI (urinary tract infection) 03/07/2017  . Sepsis ([place]) 03/07/2017  . Sepsis due to urinary tract infection ([place]) 03/07/2017  . HTN (hypertension) 03/07/2017  . Constipation by delayed colonic transit 03/07/2017  . Abnormal nuclear stress test 12/20/2015  . Angina, class III ([place]) 12/20/2015  . Chest pain 11/12/2014  . Upper GI bleed 03/04/2013  . Atypical chest pain 01/07/2013  . History of tobacco abuse 01/07/2013  . SIRS (systemic inflammatory response syndrome) ([place]) 07/28/2012  . Tachycardia 07/28/2012  . GERD (gastroesophageal reflux disease) 02/24/2011  . Coronary artery disease involving native coronary artery with angina pectoris (  [place])   . Hx of CABG   . Overweight   . OSA (obstructive sleep apnea)   . Nausea & vomiting   . Breast cancer ([place])   . Hypertension   . Diabetes mellitus ([place])   . Dyslipidemia   . Ejection fraction     Past Surgical History:  Procedure Laterality Date  . ABDOMINAL HYSTERECTOMY  1989  . CARDIAC CATHETERIZATION N/A 12/20/2015   Procedure: Left Heart Cath and Cors/Grafts Angiography;  Surgeon: Leonie Man, MD;  Location: [place] CV LAB;  Service: Cardiovascular;  Laterality: N/A;  . CORONARY ARTERY BYPASS GRAFT  2009  . CORONARY STENT PLACEMENT    . GASTRIC BYPASS  1983   open.  [place]  . MASTECTOMY  Bilateral 1987     OB History   No obstetric history on file.     Family History  Problem Relation Age of Onset  . Hypertension Mother   . Heart disease Mother   . Diabetes Mother   . Heart disease Father   . Colon cancer Father        does not know age of onset  . Heart disease Sister   . Colon cancer Sister 42  . Stomach cancer Sister   . Diabetes Sister   . Asthma Sister   . Diabetes Sister   . Esophageal cancer Neg Hx   . Rectal cancer Neg Hx     Social History   Tobacco Use  . Smoking status: Former Smoker    Packs/day: 3.00    Years: 27.00    Pack years: 81.00    Types: Cigarettes    Quit date: 09/14/2000    Years since quitting: 19.9  . Smokeless tobacco: Never Used  Vaping Use  . Vaping Use: Never used  Substance Use Topics  . Alcohol use: No  . Drug use: No    Home Medications Prior to Admission medications   Medication Sig Start Date End Date Taking? Authorizing Provider  acetaminophen (TYLENOL) 500 MG tablet Take 1 tablet (500 mg total) every 6 (six) hours as needed by mouth. 07/27/17   Kirichenko, Tatyana, PA-C  albuterol (PROVENTIL HFA;VENTOLIN HFA) 108 (90 BASE) MCG/ACT inhaler Inhale 2 puffs into the lungs every 6 (six) hours as needed for shortness of breath.     [provider]  aspirin EC 81 MG tablet Take 81 mg by mouth daily.     [provider]  cephALEXin (KEFLEX) 500 MG capsule Take 2 capsules (1,000 mg total) by mouth 2 (two) times daily. 08/10/20   [name], MD  cetirizine (ZYRTEC ALLERGY) 10 MG tablet Take 1 tablet (10 mg total) by mouth daily. Patient not taking: Reported on 12/02/2019 11/25/19   Palumbo, April, MD  cetirizine (ZYRTEC) 10 MG tablet Take 1 tablet (10 mg total) by mouth daily. 08/16/17   Maczis, Barth Kirks, PA-C  cholecalciferol (VITAMIN D3) 25 MCG (1000 UT) tablet Take 1,000 Units by mouth daily.    [provider]  fluticasone (FLONASE) 50 MCG/ACT nasal spray Place 2 sprays into both  nostrils daily. 08/16/17   Maczis, Barth Kirks, PA-C  glipiZIDE (GLUCOTROL XL) 10 MG 24 hr tablet Take 1 tablet (10 mg total) by mouth daily with breakfast. 12/17/15   [name], NP  HYDROcodone-acetaminophen (NORCO/VICODIN) 5-325 MG tablet Take 1 tablet by mouth every 6 (six) hours as needed for moderate pain or severe pain. 08/10/20   [name], MD  latanoprost (XALATAN) 0.005 % ophthalmic solution  Place 1 drop into both eyes at bedtime.    [provider]  lisinopril-hydrochlorothiazide (PRINZIDE,ZESTORETIC) 20-12.5 MG per tablet Take 1 tablet by mouth daily.      [provider]  metoprolol tartrate (LOPRESSOR) 25 MG tablet TAKE 1 TABLET BY MOUTH 2 TIMES A DAY Patient taking differently: Take 25 mg by mouth 2 (two) times daily.  10/09/19   [name], MD  montelukast (SINGULAIR) 10 MG tablet Take 10 mg by mouth daily. 08/14/19   [provider]  naproxen (NAPROSYN) 375 MG tablet Take 1 tablet (375 mg total) by mouth 2 (two) times daily. Patient not taking: Reported on 5/46/2703 50/09/38   [name], MD  nitrofurantoin, macrocrystal-monohydrate, (MACROBID) 100 MG capsule Take 1 capsule (100 mg total) by mouth 2 (two) times daily. 12/30/19   Cardama, Grayce Sessions, MD  nitroGLYCERIN (NITROSTAT) 0.4 MG SL tablet Place 1 tablet (0.4 mg total) under the tongue every 5 (five) minutes as needed for chest pain. 01/15/17   [name], MD  phenazopyridine (PYRIDIUM) 200 MG tablet Take 1 tablet (200 mg total) by mouth 3 (three) times daily as needed for pain. 08/10/20   [name], MD  rosuvastatin (CRESTOR) 10 MG tablet Take 1 tablet (10 mg total) by mouth at bedtime. 08/26/18   [name], MD    Allergies    Latex  Review of Systems   Review of Systems 10 systems reviewed and negative except as per HPI Physical Exam Updated Vital Signs BP 115/74   Pulse 61   Temp 98.2 F (36.8 C) (Oral)   Resp 20   SpO2 98%   Physical Exam Constitutional:       Comments: Alert and nontoxic.  Clinically well in appearance.  No respiratory distress.  HENT:     Head: Normocephalic and atraumatic.     Mouth/Throat:     Pharynx: Oropharynx is clear.  Eyes:     Extraocular Movements: Extraocular movements intact.  Cardiovascular:     Rate and Rhythm: Normal rate and regular rhythm.  Pulmonary:     Effort: Pulmonary effort is normal.     Breath sounds: Normal breath sounds.  Abdominal:     Comments: Patient dorsal discomfort to palpation in the low left abdomen and trailing around to the flank area.  No palpable abnormality.  No skin changes.  No rash in groin folds or pannus.  Patient does endorse some discomfort to palpation of the upper medial thigh.  No palpable abnormality or skin changes in this area.  Musculoskeletal:     Comments: Bilateral lower legs no peripheral edema.  Calves are soft and nontender.  Feet are in good condition.  Dorsalis pedis pulses are 2+ and strong.  Patient endorses worsening of pain with straight leg raise bilaterally but left greater than right.  With flexion and extension of the foot she also endorses worsening of pain on the left.  Strength is intact.  Neurological:     General: No focal deficit present.     Mental Status: She is oriented to person, place, and time.     Motor: No weakness.     Coordination: Coordination normal.  Psychiatric:        Mood and Affect: Mood normal.     ED Results / Procedures / Treatments   Labs (all labs ordered are listed, but only abnormal results are displayed) Labs Reviewed  COMPREHENSIVE METABOLIC PANEL - Abnormal; Notable for the following components:  Result Value   Glucose, Bld 188 (*)    All other components within normal limits  URINALYSIS, ROUTINE W REFLEX MICROSCOPIC - Abnormal; Notable for the following components:   APPearance HAZY (*)    Leukocytes,Ua MODERATE (*)    Bacteria, UA RARE (*)    All other components within normal limits  URINE CULTURE    LIPASE, BLOOD  CBC    EKG None  Radiology CT Renal Stone Study  Result Date: 08/10/2020 CLINICAL DATA:  Left-sided pelvic pain. EXAM: CT ABDOMEN AND PELVIS WITHOUT CONTRAST TECHNIQUE: Multidetector CT imaging of the abdomen and pelvis was performed following the standard protocol without IV contrast. COMPARISON:  None. FINDINGS: Lower chest: Multiple sternal wires are seen. Mild atelectasis and/or infiltrate is noted along the posterior aspect of the right lung base. Hepatobiliary: No focal liver abnormality is seen. No gallstones, gallbladder wall thickening, or biliary dilatation. Pancreas: Unremarkable. No pancreatic ductal dilatation or surrounding inflammatory changes. Spleen: Normal in size without focal abnormality. Adrenals/Urinary Tract: Adrenal glands are unremarkable. Kidneys are normal, without renal calculi, focal lesion, or hydronephrosis. Bladder is unremarkable. Stomach/Bowel: Surgical sutures are seen within the gastric region. The appendix is not clearly identified. No evidence of bowel wall thickening, distention, or inflammatory changes. Vascular/Lymphatic: Aortic atherosclerosis. No enlarged abdominal or pelvic lymph nodes. Reproductive: Status post hysterectomy. No adnexal masses. Other: Surgical sutures are seen along the midline of the anterior pelvic wall. No abdominopelvic ascites. Musculoskeletal: No acute or significant osseous findings. IMPRESSION: 1.  Evidence of prior median sternotomy and prior hysterectomy. 2.  Mild right basilar atelectasis and/or infiltrate. 3. Prior gastric bypass surgery. Aortic Atherosclerosis (ICD10-I70.0). Electronically Signed   By: Virgina Norfolk M.D.   On: 08/10/2020 17:53    Procedures Procedures (including critical care time)  Medications Ordered in ED Medications  phenazopyridine (PYRIDIUM) tablet 200 mg (has no administration in time range)  HYDROcodone-acetaminophen (NORCO/VICODIN) 5-325 MG per tablet 1 tablet (has no  administration in time range)  morphine 4 MG/ML injection 4 mg (4 mg Intravenous Given 08/10/20 1655)  cefTRIAXone (ROCEPHIN) 2 g in sodium chloride 0.9 % 100 mL IVPB (0 g Intravenous Stopped 08/10/20 1922)    ED Course  I have reviewed the triage vital signs and the nursing notes.  Pertinent labs & imaging results that were available during my care of the patient were reviewed by me and considered in my medical decision making (see chart for details).    MDM Rules/Calculators/A&P                         Recheck: Patient reports feeling significantly improved.  All pain is resolved.  Urinalysis positive for UTI.  CT scan does not show any kidney stone or significant pyelonephritis.  Patient was given Rocephin in the emergency department.  At this time she feels significantly improved vital signs are normal with normal blood pressure, temperature and heart rate.  We will have the patient continue Keflex and Pyridium at home. Patient also described pain radiating into her leg.  She has normal neurovascular exam.  Consideration is given to possible sciatica.  I did discuss this with the patient.  She will be following up with her doctor the beginning of the week.  If she is having worsening symptoms of weakness or leg pain she will need further evaluation.  Return precautions reviewed. Final Clinical Impression(s) / ED Diagnoses Final diagnoses:  Flank pain  Lower urinary tract infectious disease  Rx / DC Orders ED Discharge Orders         Ordered    cephALEXin (KEFLEX) 500 MG capsule  2 times daily        08/10/20 1928    phenazopyridine (PYRIDIUM) 200 MG tablet  3 times daily PRN        08/10/20 1928    HYDROcodone-acetaminophen (NORCO/VICODIN) 5-325 MG tablet  Every 6 hours PRN        08/10/20 1928           [name], MD 08/10/20 1932

## 2020-08-10 NOTE — Discharge Instructions (Addendum)
1.  You have been given a dose of Rocephin in the emergency department for urinary tract infection.  This antibiotic is good for 24 hours.  You may start your prescription for Keflex tomorrow evening. 2.  You have also been given a dose of Pyridium.  This is a medication to help with bladder discomfort and burning and frequency.  It will turn your urine bright orange, this is to be expected. 3.  Your CT scan does not show any significant abnormalities. 4.  You have also describes some pain radiating into your leg.  You may also have sciatica.  At this time, your symptoms seem significantly improved.  Discussed this finding with your doctor.  If symptoms are worsening or changing you may need additional evaluation such as an MRI. 5.  Return to the emergency department if you develop a fever, worsening pain, nausea vomiting or other concerning symptoms.

## 2020-08-12 LAB — URINE CULTURE

## 2020-08-26 ENCOUNTER — Other Ambulatory Visit: Payer: Self-pay

## 2020-08-26 ENCOUNTER — Ambulatory Visit (INDEPENDENT_AMBULATORY_CARE_PROVIDER_SITE_OTHER): Payer: Medicare Other | Admitting: Cardiology

## 2020-08-26 ENCOUNTER — Encounter: Payer: Self-pay | Admitting: Cardiology

## 2020-08-26 VITALS — BP 120/70 | HR 71 | Ht <= 58 in | Wt 207.0 lb

## 2020-08-26 DIAGNOSIS — I2510 Atherosclerotic heart disease of native coronary artery without angina pectoris: Secondary | ICD-10-CM

## 2020-08-26 DIAGNOSIS — I209 Angina pectoris, unspecified: Secondary | ICD-10-CM | POA: Diagnosis not present

## 2020-08-26 DIAGNOSIS — I2583 Coronary atherosclerosis due to lipid rich plaque: Secondary | ICD-10-CM

## 2020-08-26 NOTE — Patient Instructions (Signed)
Medication Instructions:  Your physician recommends that you continue on your current medications as directed. Please refer to the Current Medication list given to you today.  *If you need a refill on your cardiac medications before your next appointment, please call your pharmacy*   Lab Work: None ordered   Testing/Procedures: None ordered   Follow-Up: At [facility], you and your health needs are our priority.  As part of our continuing mission to provide you with exceptional heart care, we have created designated Provider Care Teams.  These Care Teams include your primary Cardiologist (physician) and Advanced Practice Providers (APPs -  Physician Assistants and Nurse Practitioners) who all work together to provide you with the care you need, when you need it.  We recommend signing up for the patient portal called "MyChart".  Sign up information is provided on this After Visit Summary.  MyChart is used to connect with patients for Virtual Visits (Telemedicine).  Patients are able to view lab/test results, encounter notes, upcoming appointments, etc.  Non-urgent messages can be sent to your provider as well.   To learn more about what you can do with MyChart, go to [email or web address].    Your next appointment:   1 year(s)  The format for your next appointment:   In Person  Provider:   Candee Furbish, MD    Thank you for choosing [facility]!!

## 2020-08-26 NOTE — Progress Notes (Signed)
Cardiology Office Note:    Date:  08/26/2020   ID:  Patricia Cuevas, DOB Dec 08, 1950, MRN [id number]  PCP:  [name], MD  [facility] Cardiologist:  Candee Furbish, MD  [facility] Electrophysiologist:  None   Referring MD: [name], MD     History of Present Illness:    Patricia Cuevas is a 69 y.o. female here for the follow-up of CAD.  CABG 2007 Cardiac catheterization 2011 Cardiac catheterization 2017: Patent LIMA to LAD small caliber vessel distally diffuse plaque with SVG patent to OM and OM 2.  Patent SVG to PDA and PLA, occluded SVG to diagonal.  She states the majority of her care in the emergency department. Abdominal pain for instance.  No fevers chills nausea vomiting.  Past Medical History:  Diagnosis Date  . Alcohol abuse    in the past, resolved  . Arthritis   . Asthma   . Blood transfusion without reported diagnosis 1984   after surgery  . Breast cancer [facility])    Double mastectomy 2007; s/p tamoxifen and arimidex therapy; no recurrence since 05/2008  . CAD (coronary artery disease)    Nuclear stress test Feb 11, 2011, EF 64%, no scar or ischemia    //         catheterization, November, 2011,patent LIMA-LAD-small caliber distal vessel with diffuse plaque, patent SVG to OM1 and OM 2, patent SVG to PDA and PLA, occluded SVG to diagonal, medical therapy;  Lexiscan Myoview 6/14:  Inferior thinning, no ischemia, EF 61%, low risk  . CHF (congestive heart failure) ([place])   . Chronic back pain   . Diabetes mellitus   . Dyslipidemia   . Ejection fraction    EF 65%, echo, [place], Jan 16, 2011  . Hx of CABG    2007  . Hx of colonic polyps   . Hyperlipidemia   . Hypertension   . Myocardial infarction ([place])   . Nausea & vomiting    hospitalization, November, 2011, stricture GE junction, questionable stenosis gastrojejunostomy, disruptive primary peristaltic wave, GE reflux, delayed emptying proximal gastric pouch  . Normal cardiac stress test 02/2013   . Overweight(278.02)    stomach stapling 1985 followed by weight loss, then return of weight  . Sciatica   . Sleep apnea    CPAP being arranged May, 2011//does not use CPAP  . Tobacco abuse    in the past, resolved  . UTI (urinary tract infection)     Past Surgical History:  Procedure Laterality Date  . ABDOMINAL HYSTERECTOMY  1989  . CARDIAC CATHETERIZATION N/A 12/20/2015   Procedure: Left Heart Cath and Cors/Grafts Angiography;  Surgeon: Leonie Man, MD;  Location: [place] [facility];  Service: Cardiovascular;  Laterality: N/A;  . CORONARY ARTERY BYPASS GRAFT  2009  . CORONARY STENT PLACEMENT    . GASTRIC BYPASS  1983   open.  [place]  . MASTECTOMY Bilateral 1987    Current Medications: Current Meds  Medication Sig  . acetaminophen (TYLENOL) 500 MG tablet Take 1 tablet (500 mg total) every 6 (six) hours as needed by mouth.  [name] Kitchen albuterol (PROVENTIL HFA;VENTOLIN HFA) 108 (90 BASE) MCG/ACT inhaler Inhale 2 puffs into the lungs every 6 (six) hours as needed for shortness of breath.  [name] Kitchen aspirin EC 81 MG tablet Take 81 mg by mouth daily.   . cetirizine (ZYRTEC) 10 MG tablet Take 1 tablet (10 mg total) by mouth daily.  . cholecalciferol (VITAMIN D3) 25 MCG (1000  UT) tablet Take 1,000 Units by mouth daily.  . fluticasone (FLONASE) 50 MCG/ACT nasal spray Place 2 sprays into both nostrils daily.  [name] Kitchen glipiZIDE (GLUCOTROL XL) 10 MG 24 hr tablet Take 1 tablet (10 mg total) by mouth daily with breakfast.  . HYDROcodone-acetaminophen (NORCO/VICODIN) 5-325 MG tablet Take 1 tablet by mouth every 6 (six) hours as needed for moderate pain or severe pain.  [name] Kitchen latanoprost (XALATAN) 0.005 % ophthalmic solution Place 1 drop into both eyes at bedtime.  [name] Kitchen lisinopril-hydrochlorothiazide (PRINZIDE,ZESTORETIC) 20-12.5 MG per tablet Take 1 tablet by mouth daily.  . metoprolol tartrate (LOPRESSOR) 25 MG tablet TAKE 1 TABLET BY MOUTH 2 TIMES A DAY  . montelukast (SINGULAIR) 10 MG tablet Take 10 mg by mouth  daily.  . naproxen (NAPROSYN) 375 MG tablet Take 1 tablet (375 mg total) by mouth 2 (two) times daily.  . nitrofurantoin, macrocrystal-monohydrate, (MACROBID) 100 MG capsule Take 1 capsule (100 mg total) by mouth 2 (two) times daily.  . nitroGLYCERIN (NITROSTAT) 0.4 MG SL tablet Place 1 tablet (0.4 mg total) under the tongue every 5 (five) minutes as needed for chest pain.  . phenazopyridine (PYRIDIUM) 200 MG tablet Take 1 tablet (200 mg total) by mouth 3 (three) times daily as needed for pain.  . rosuvastatin (CRESTOR) 10 MG tablet Take 1 tablet (10 mg total) by mouth at bedtime.     Allergies:   Latex   Social History   Socioeconomic History  . Marital status: Single    Spouse name: Not on file  . Number of children: Not on file  . Years of education: Not on file  . Highest education level: Not on file  Occupational History  . Occupation: Disabled  Tobacco Use  . Smoking status: Former Smoker    Packs/day: 3.00    Years: 27.00    Pack years: 81.00    Types: Cigarettes    Quit date: 09/14/2000    Years since quitting: 19.9  . Smokeless tobacco: Never Used  Vaping Use  . Vaping Use: Never used  Substance and Sexual Activity  . Alcohol use: No  . Drug use: No  . Sexual activity: Not on file  Other Topics Concern  . Not on file  Social History Narrative  . Not on file   Social Determinants of Health   Financial Resource Strain: Not on file  Food Insecurity: Not on file  Transportation Needs: Not on file  Physical Activity: Not on file  Stress: Not on file  Social Connections: Not on file     Family History: The patient's family history includes Asthma in her sister; Colon cancer in her father; Colon cancer (age of onset: 73) in her sister; Diabetes in her mother, sister, and sister; Heart disease in her father, mother, and sister; Hypertension in her mother; Stomach cancer in her sister. There is no history of Esophageal cancer or Rectal cancer.  ROS:   Please see  the history of present illness.     All other systems reviewed and are negative.  EKGs/Labs/Other Studies Reviewed:    The following studies were reviewed today:   Cath 12/20/15:  Prox RCA to Mid RCA lesion, 55% stenosed. Mid RCA to Dist RCA lesion, 100% stenosed. Mild disease of the Ostial-Prox SVG-dRCA(filling PLA & PDA).  Mid LAD lesion, 100% stenosed. LIMA-LAD is patent, but very small to a small distal LAD. Very tortuous graft - not a good option to attempt PCI through.  Prox Cx to Mid Cx lesion,  100% stenosed. The lesion was previously treated with a stent (unknown type) greater than two years ago. Mid Cx lesion, 100% stenosed. Patent SVG-OM2 & OM3.  Origin to Prox SVG-Diag Graft lesion, 100% stenosed. Known.  Sequential SVG-OM2-OM3 was injected is large & widely patent.  The left ventricular systolic function is normal.   EKG: EKG on 12/02/2019-sinus rhythm nonspecific ST changes.  Excellent.  Recent Labs: 08/10/2020: ALT 14; BUN 10; Creatinine, Ser 0.84; Hemoglobin 13.3; Platelets 185; Potassium 3.9; Sodium 139  Recent Lipid Panel    Component Value Date/Time   CHOL 158 11/15/2018 1550   TRIG 138 11/15/2018 1550   HDL 46 11/15/2018 1550   CHOLHDL 3.4 11/15/2018 1550   CHOLHDL 3.4 05/17/2009 0130   VLDL 19 05/17/2009 0130   LDLCALC 84 11/15/2018 1550     Risk Assessment/Calculations:       Physical Exam:    VS:  BP 120/70 (BP Location: Right Arm, Patient Position: Sitting, Cuff Size: Normal)   Pulse 71   Ht 4\' 9"  (1.448 m)   Wt 207 lb (93.9 kg)   SpO2 94%   BMI 44.79 kg/m     Wt Readings from Last 3 Encounters:  08/26/20 207 lb (93.9 kg)  12/29/19 212 lb 1.3 oz (96.2 kg)  12/02/19 212 lb (96.2 kg)     GEN:  Well nourished, well developed in no acute distress HEENT: Normal NECK: No JVD; No carotid bruits LYMPHATICS: No lymphadenopathy CARDIAC: RRR, no murmurs, rubs, gallops RESPIRATORY:  Clear to auscultation without rales, wheezing or rhonchi   ABDOMEN: Soft, non-tender, non-distended MUSCULOSKELETAL:  No edema; No deformity  SKIN: Warm and dry NEUROLOGIC:  Alert and oriented x 3 PSYCHIATRIC:  Normal affect   ASSESSMENT:    No diagnosis found. PLAN:    In order of problems listed above:  Coronary artery disease/angina -2017 cardiac catheterization reassuring medical management no change in anatomy from 2011.  History of CABG bypass surgery 2007 -Continue with secondary risk factor prevention.  Hyperlipidemia -Crestor 10 mg.  Continue with goal LDL less than 70.  Morbid obesity -Continue to encourage weight loss.  Peak weight previously was 525 pounds.  Doing great.  Angina -Metoprolol. More tired with walking. Racing HR when stopped.   Potassium 3.9 creatinine 0.84 hemoglobin 13.3       Medication Adjustments/Labs and Tests Ordered: Current medicines are reviewed at length with the patient today.  Concerns regarding medicines are outlined above.  No orders of the defined types were placed in this encounter.  No orders of the defined types were placed in this encounter.   Patient Instructions  Medication Instructions:  Your physician recommends that you continue on your current medications as directed. Please refer to the Current Medication list given to you today.  *If you need a refill on your cardiac medications before your next appointment, please call your pharmacy*   Lab Work: None ordered   Testing/Procedures: None ordered   Follow-Up: At [facility], you and your health needs are our priority.  As part of our continuing mission to provide you with exceptional heart care, we have created designated Provider Care Teams.  These Care Teams include your primary Cardiologist (physician) and Advanced Practice Providers (APPs -  Physician Assistants and Nurse Practitioners) who all work together to provide you with the care you need, when you need it.  We recommend signing up for the patient  portal called "MyChart".  Sign up information is provided on this After Visit Summary.  MyChart is used to connect with patients for Virtual Visits (Telemedicine).  Patients are able to view lab/test results, encounter notes, upcoming appointments, etc.  Non-urgent messages can be sent to your provider as well.   To learn more about what you can do with MyChart, go to [email or web address].    Your next appointment:   1 year(s)  The format for your next appointment:   In Person  Provider:   Candee Furbish, MD    Thank you for choosing [facility]!!         Signed, Candee Furbish, MD  08/26/2020 9:43 AM    [place]

## 2020-09-07 ENCOUNTER — Emergency Department (HOSPITAL_COMMUNITY)
Admission: EM | Admit: 2020-09-07 | Discharge: 2020-09-07 | Disposition: A | Discharge status: Home / Self Care | Payer: Medicare Other | Attending: Emergency Medicine | Admitting: Emergency Medicine

## 2020-09-07 ENCOUNTER — Encounter (HOSPITAL_COMMUNITY): Payer: Self-pay

## 2020-09-07 DIAGNOSIS — I110 Hypertensive heart disease with heart failure: Secondary | ICD-10-CM | POA: Diagnosis not present

## 2020-09-07 DIAGNOSIS — Z7952 Long term (current) use of systemic steroids: Secondary | ICD-10-CM | POA: Diagnosis not present

## 2020-09-07 DIAGNOSIS — Z79899 Other long term (current) drug therapy: Secondary | ICD-10-CM | POA: Diagnosis not present

## 2020-09-07 DIAGNOSIS — Z7901 Long term (current) use of anticoagulants: Secondary | ICD-10-CM | POA: Insufficient documentation

## 2020-09-07 DIAGNOSIS — I509 Heart failure, unspecified: Secondary | ICD-10-CM | POA: Insufficient documentation

## 2020-09-07 DIAGNOSIS — Z7982 Long term (current) use of aspirin: Secondary | ICD-10-CM | POA: Diagnosis not present

## 2020-09-07 DIAGNOSIS — I25810 Atherosclerosis of coronary artery bypass graft(s) without angina pectoris: Secondary | ICD-10-CM | POA: Insufficient documentation

## 2020-09-07 DIAGNOSIS — Z91040 Latex allergy status: Secondary | ICD-10-CM | POA: Insufficient documentation

## 2020-09-07 DIAGNOSIS — Z951 Presence of aortocoronary bypass graft: Secondary | ICD-10-CM | POA: Diagnosis not present

## 2020-09-07 DIAGNOSIS — R079 Chest pain, unspecified: Secondary | ICD-10-CM

## 2020-09-07 DIAGNOSIS — Z853 Personal history of malignant neoplasm of breast: Secondary | ICD-10-CM | POA: Diagnosis not present

## 2020-09-07 DIAGNOSIS — Z7984 Long term (current) use of oral hypoglycemic drugs: Secondary | ICD-10-CM | POA: Insufficient documentation

## 2020-09-07 DIAGNOSIS — Z87891 Personal history of nicotine dependence: Secondary | ICD-10-CM | POA: Diagnosis not present

## 2020-09-07 DIAGNOSIS — J45909 Unspecified asthma, uncomplicated: Secondary | ICD-10-CM | POA: Diagnosis not present

## 2020-09-07 DIAGNOSIS — E119 Type 2 diabetes mellitus without complications: Secondary | ICD-10-CM | POA: Diagnosis not present

## 2020-09-07 LAB — COMPREHENSIVE METABOLIC PANEL
ALT: 15 U/L (ref 0–44)
AST: 25 U/L (ref 15–41)
Albumin: 3.4 g/dL — ABNORMAL LOW (ref 3.5–5.0)
Alkaline Phosphatase: 66 U/L (ref 38–126)
Anion gap: 11 (ref 5–15)
BUN: 10 mg/dL (ref 8–23)
CO2: 25 mmol/L (ref 22–32)
Calcium: 9.2 mg/dL (ref 8.9–10.3)
Chloride: 102 mmol/L (ref 98–111)
Creatinine, Ser: 0.77 mg/dL (ref 0.44–1.00)
GFR, Estimated: >60 mL/min (ref >60)
Glucose, Bld: 147 mg/dL — ABNORMAL HIGH (ref 70–99)
Potassium: 4 mmol/L (ref 3.5–5.1)
Sodium: 138 mmol/L (ref 135–145)
Total Bilirubin: 0.3 mg/dL (ref 0.3–1.2)
Total Protein: 6.3 g/dL — ABNORMAL LOW (ref 6.5–8.1)

## 2020-09-07 LAB — CBC WITH DIFFERENTIAL/PLATELET
Abs Immature Granulocytes: 0.01 10*3/uL (ref 0.00–0.07)
Basophils Absolute: 0 10*3/uL (ref 0.0–0.1)
Basophils Relative: 1 %
Eosinophils Absolute: 0 10*3/uL (ref 0.0–0.5)
Eosinophils Relative: 1 %
HCT: 43.1 % (ref 36.0–46.0)
Hemoglobin: 13.2 g/dL (ref 12.0–15.0)
Immature Granulocytes: 0 %
Lymphocytes Relative: 52 %
Lymphs Abs: 2.8 10*3/uL (ref 0.7–4.0)
MCH: 28.1 pg (ref 26.0–34.0)
MCHC: 30.6 g/dL (ref 30.0–36.0)
MCV: 91.7 fL (ref 80.0–100.0)
Monocytes Absolute: 0.5 10*3/uL (ref 0.1–1.0)
Monocytes Relative: 9 %
Neutro Abs: 1.9 10*3/uL (ref 1.7–7.7)
Neutrophils Relative %: 37 %
Platelets: 177 10*3/uL (ref 150–400)
RBC: 4.7 MIL/uL (ref 3.87–5.11)
RDW: 14.4 % (ref 11.5–15.5)
WBC: 5.3 10*3/uL (ref 4.0–10.5)
nRBC: 0 % (ref 0.0–0.2)

## 2020-09-07 LAB — TROPONIN I (HIGH SENSITIVITY)
Troponin I (High Sensitivity): 4 ng/L (ref <18)
Troponin I (High Sensitivity): 5 ng/L (ref <18)

## 2020-09-07 NOTE — ED Provider Notes (Signed)
[place] EMERGENCY DEPARTMENT Provider Note   CSN: [id number] Arrival date & time: 09/07/20  0220     History Chief Complaint  Patient presents with  . Chest Pain    Patricia Cuevas is a 69 y.o. female.  Patient states that she is having an emotional conversation with her family about the death of a loved one. She got upset and started having some left-sided chest pressure. States she was crying during this time.  She states the pain went away pretty quickly and she was just relaxing and talking more with her family when her husband had called EMS and they showed up and brought her here for further evaluation.  Patient has a history of coronary disease.  Asymptomatic at this time.  No other associated symptoms with the pain.  States it did not feel like a heart attack.   Chest Pain      Past Medical History:  Diagnosis Date  . Alcohol abuse    in the past, resolved  . Arthritis   . Asthma   . Blood transfusion without reported diagnosis 1984   after surgery  . Breast cancer [facility])    Double mastectomy 2007; s/p tamoxifen and arimidex therapy; no recurrence since 05/2008  . CAD (coronary artery disease)    Nuclear stress test Feb 11, 2011, EF 64%, no scar or ischemia    //         catheterization, November, 2011,patent LIMA-LAD-small caliber distal vessel with diffuse plaque, patent SVG to OM1 and OM 2, patent SVG to PDA and PLA, occluded SVG to diagonal, medical therapy;  Lexiscan Myoview 6/14:  Inferior thinning, no ischemia, EF 61%, low risk  . CHF (congestive heart failure) ([place])   . Chronic back pain   . Diabetes mellitus   . Dyslipidemia   . Ejection fraction    EF 65%, echo, [place], Jan 16, 2011  . Hx of CABG    2007  . Hx of colonic polyps   . Hyperlipidemia   . Hypertension   . Myocardial infarction ([place])   . Nausea & vomiting    hospitalization, November, 2011, stricture GE junction, questionable stenosis gastrojejunostomy, disruptive  primary peristaltic wave, GE reflux, delayed emptying proximal gastric pouch  . Normal cardiac stress test 02/2013  . Overweight(278.02)    stomach stapling 1985 followed by weight loss, then return of weight  . Sciatica   . Sleep apnea    CPAP being arranged May, 2011//does not use CPAP  . Tobacco abuse    in the past, resolved  . UTI (urinary tract infection)     Patient Active Problem List   Diagnosis Date Noted  . Asthma 03/07/2017  . Sleep apnea 03/07/2017  . Diabetes mellitus type 2, controlled ([place]) 03/07/2017  . Hyperlipidemia 03/07/2017  . CAD (coronary artery disease) with prior CABG 03/07/2017  . UTI (urinary tract infection) 03/07/2017  . Sepsis ([place]) 03/07/2017  . Sepsis due to urinary tract infection ([place]) 03/07/2017  . HTN (hypertension) 03/07/2017  . Constipation by delayed colonic transit 03/07/2017  . Abnormal nuclear stress test 12/20/2015  . Angina, class III ([place]) 12/20/2015  . Chest pain 11/12/2014  . Upper GI bleed 03/04/2013  . Atypical chest pain 01/07/2013  . History of tobacco abuse 01/07/2013  . SIRS (systemic inflammatory response syndrome) ([place]) 07/28/2012  . Tachycardia 07/28/2012  . GERD (gastroesophageal reflux disease) 02/24/2011  . Coronary artery disease involving native coronary artery with angina pectoris ([place])   .  Hx of CABG   . Overweight   . OSA (obstructive sleep apnea)   . Nausea & vomiting   . Breast cancer ([place])   . Hypertension   . Diabetes mellitus ([place])   . Dyslipidemia   . Ejection fraction     Past Surgical History:  Procedure Laterality Date  . ABDOMINAL HYSTERECTOMY  1989  . CARDIAC CATHETERIZATION N/A 12/20/2015   Procedure: Left Heart Cath and Cors/Grafts Angiography;  Surgeon: Leonie Man, MD;  Location: [place] [facility];  Service: Cardiovascular;  Laterality: N/A;  . CORONARY ARTERY BYPASS GRAFT  2009  . CORONARY STENT PLACEMENT    . GASTRIC BYPASS  1983   open.  [place]  . MASTECTOMY Bilateral 1987      OB History   No obstetric history on file.     Family History  Problem Relation Age of Onset  . Hypertension Mother   . Heart disease Mother   . Diabetes Mother   . Heart disease Father   . Colon cancer Father        does not know age of onset  . Heart disease Sister   . Colon cancer Sister 10  . Stomach cancer Sister   . Diabetes Sister   . Asthma Sister   . Diabetes Sister   . Esophageal cancer Neg Hx   . Rectal cancer Neg Hx     Social History   Tobacco Use  . Smoking status: Former Smoker    Packs/day: 3.00    Years: 27.00    Pack years: 81.00    Types: Cigarettes    Quit date: 09/14/2000    Years since quitting: 19.9  . Smokeless tobacco: Never Used  Vaping Use  . Vaping Use: Never used  Substance Use Topics  . Alcohol use: No  . Drug use: No    Home Medications Prior to Admission medications   Medication Sig Start Date End Date Taking? Authorizing Provider  albuterol (PROVENTIL HFA;VENTOLIN HFA) 108 (90 BASE) MCG/ACT inhaler Inhale 2 puffs into the lungs every 6 (six) hours as needed for shortness of breath.   Yes [provider]  aspirin EC 81 MG tablet Take 81 mg by mouth daily.    Yes [provider]  cetirizine (ZYRTEC) 10 MG tablet Take 1 tablet (10 mg total) by mouth daily. 08/16/17  Yes Maczis, Barth Kirks, PA-C  cholecalciferol (VITAMIN D3) 25 MCG (1000 UT) tablet Take 1,000 Units by mouth daily.   Yes [provider]  fluticasone (FLONASE) 50 MCG/ACT nasal spray Place 2 sprays into both nostrils daily. Patient taking differently: Place 2 sprays into both nostrils daily as needed for allergies. 08/16/17  Yes Maczis, Barth Kirks, PA-C  glipiZIDE (GLUCOTROL XL) 10 MG 24 hr tablet Take 1 tablet (10 mg total) by mouth daily with breakfast. 12/17/15  Yes [name], NP  HYDROcodone-acetaminophen (NORCO) 10-325 MG tablet Take 1 tablet by mouth 2 (two) times daily as needed for moderate pain. 08/28/20  Yes [provider]  latanoprost (XALATAN) 0.005 % ophthalmic solution Place 1 drop into both eyes at bedtime.   Yes [provider]  lisinopril-hydrochlorothiazide (PRINZIDE,ZESTORETIC) 20-12.5 MG per tablet Take 1 tablet by mouth daily.   Yes [provider]  metoprolol tartrate (LOPRESSOR) 25 MG tablet TAKE 1 TABLET BY MOUTH 2 TIMES A DAY Patient taking differently: Take 25 mg by mouth 2 (two) times daily. 10/09/19  Yes [name], MD  montelukast (SINGULAIR) 10  MG tablet Take 10 mg by mouth at bedtime. 08/14/19  Yes [provider]  nitroGLYCERIN (NITROSTAT) 0.4 MG SL tablet Place 1 tablet (0.4 mg total) under the tongue every 5 (five) minutes as needed for chest pain. 01/15/17  Yes Jake Bathe, MD  rosuvastatin (CRESTOR) 10 MG tablet Take 1 tablet (10 mg total) by mouth at bedtime. 08/26/18  Yes Jake Bathe, MD  HYDROcodone-acetaminophen (NORCO/VICODIN) 5-325 MG tablet Take 1 tablet by mouth every 6 (six) hours as needed for moderate pain or severe pain. Patient not taking: Reported on 09/07/2020 08/10/20   Arby Barrette, MD  phenazopyridine (PYRIDIUM) 200 MG tablet Take 1 tablet (200 mg total) by mouth 3 (three) times daily as needed for pain. Patient not taking: No sig reported 08/10/20   Arby Barrette, MD    Allergies    Latex  Review of Systems   Review of Systems  Cardiovascular: Positive for chest pain.  All other systems reviewed and are negative.   Physical Exam Updated Vital Signs BP 106/70   Pulse 95   Temp 97.8 F (36.6 C)   Resp 18   SpO2 94%   Physical Exam Vitals and nursing note reviewed.  Constitutional:      Appearance: She is well-developed and well-nourished.  HENT:     Head: Normocephalic and atraumatic.     Mouth/Throat:     Mouth: Mucous membranes are moist.     Pharynx: Oropharynx is clear.  Eyes:     Pupils: Pupils are equal, round, and reactive to light.  Cardiovascular:     Rate and Rhythm: Normal rate and regular rhythm.   Pulmonary:     Effort: No respiratory distress.     Breath sounds: No stridor.  Abdominal:     General: Abdomen is flat. There is no distension.  Musculoskeletal:        General: No swelling or tenderness. Normal range of motion.     Cervical back: Normal range of motion.  Skin:    General: Skin is warm and dry.  Neurological:     General: No focal deficit present.     Mental Status: She is alert.     ED Results / Procedures / Treatments   Labs (all labs ordered are listed, but only abnormal results are displayed) Labs Reviewed  COMPREHENSIVE METABOLIC PANEL - Abnormal; Notable for the following components:      Result Value   Glucose, Bld 147 (*)    Total Protein 6.3 (*)    Albumin 3.4 (*)    All other components within normal limits  CBC WITH DIFFERENTIAL/PLATELET  TROPONIN I (HIGH SENSITIVITY)  TROPONIN I (HIGH SENSITIVITY)    EKG EKG Interpretation  Date/Time:  Saturday September 07 2020 02:43:28 EST Ventricular Rate:  63 PR Interval:    QRS Duration: 97 QT Interval:  427 QTC Calculation: 438 R Axis:   38 Text Interpretation: Sinus rhythm Abnormal R-wave progression, early transition Artifact in lead(s) I II III aVR aVL aVF Confirmed by Marily Memos [phone]) on 09/07/2020 5:46:46 AM   Radiology No results found.  Procedures Procedures (including critical care time)  Medications Ordered in ED Medications - No data to display  ED Course  I have reviewed the triage vital signs and the nursing notes.  Pertinent labs & imaging results that were available during my care of the patient were reviewed by me and considered in my medical decision making (see chart for details).    MDM Rules/Calculators/A&P  I suspect the patient's symptoms were emotional nature.  Low suspicion for an acute event at this time delta troponins were negative.  She has been pain-free since she has been here.  Will discharge to follow-up with cardiologist  will return if any new or worsening symptoms.  Final Clinical Impression(s) / ED Diagnoses Final diagnoses:  Nonspecific chest pain    Rx / DC Orders ED Discharge Orders    None       Zinia Innocent, Barbara Cower, MD 09/07/20 2300

## 2020-09-07 NOTE — ED Notes (Signed)
E-signature pad unavailable at time of pt discharge. This RN discussed discharge materials with pt and answered all pt questions. Pt stated understanding of discharge material. ? ?

## 2020-09-07 NOTE — ED Triage Notes (Addendum)
Assume care from EMS, EMS reports pt CC of intermittent chest pain radiating to neck starting about a hour and half ago. EMS states gave pt 324 of aspirin. Pt states before onset of chest pain was having a emotional conversation with her grandchildren due to the recent loss of her daughter

## 2020-10-03 ENCOUNTER — Other Ambulatory Visit: Payer: Self-pay | Admitting: Cardiology

## 2020-10-03 DIAGNOSIS — R0789 Other chest pain: Secondary | ICD-10-CM

## 2020-11-05 ENCOUNTER — Other Ambulatory Visit (INDEPENDENT_AMBULATORY_CARE_PROVIDER_SITE_OTHER): Payer: Medicare Other

## 2020-11-05 ENCOUNTER — Ambulatory Visit (INDEPENDENT_AMBULATORY_CARE_PROVIDER_SITE_OTHER): Payer: Medicare Other | Admitting: Gastroenterology

## 2020-11-05 ENCOUNTER — Encounter: Payer: Self-pay | Admitting: Gastroenterology

## 2020-11-05 VITALS — BP 120/56 | HR 60 | Ht <= 58 in | Wt 211.2 lb

## 2020-11-05 DIAGNOSIS — Z800 Family history of malignant neoplasm of digestive organs: Secondary | ICD-10-CM

## 2020-11-05 DIAGNOSIS — R195 Other fecal abnormalities: Secondary | ICD-10-CM | POA: Diagnosis not present

## 2020-11-05 DIAGNOSIS — K5900 Constipation, unspecified: Secondary | ICD-10-CM

## 2020-11-05 DIAGNOSIS — R109 Unspecified abdominal pain: Secondary | ICD-10-CM | POA: Diagnosis not present

## 2020-11-05 LAB — CBC WITH DIFFERENTIAL/PLATELET
Basophils Absolute: 0 10*3/uL (ref 0.0–0.1)
Basophils Relative: 0.9 % (ref 0.0–3.0)
Eosinophils Absolute: 0.1 10*3/uL (ref 0.0–0.7)
Eosinophils Relative: 1.4 % (ref 0.0–5.0)
HCT: 39.9 % (ref 36.0–46.0)
Hemoglobin: 13.3 g/dL (ref 12.0–15.0)
Lymphocytes Relative: 50.8 % — ABNORMAL HIGH (ref 12.0–46.0)
Lymphs Abs: 2.7 10*3/uL (ref 0.7–4.0)
MCHC: 33.3 g/dL (ref 30.0–36.0)
MCV: 86.6 fl (ref 78.0–100.0)
Monocytes Absolute: 0.5 10*3/uL (ref 0.1–1.0)
Monocytes Relative: 10 % (ref 3.0–12.0)
Neutro Abs: 2 10*3/uL (ref 1.4–7.7)
Neutrophils Relative %: 36.9 % — ABNORMAL LOW (ref 43.0–77.0)
Platelets: 182 10*3/uL (ref 150.0–400.0)
RBC: 4.61 Mil/uL (ref 3.87–5.11)
RDW: 15.7 % — ABNORMAL HIGH (ref 11.5–15.5)
WBC: 5.4 10*3/uL (ref 4.0–10.5)

## 2020-11-05 LAB — TSH: TSH: 1.05 u[IU]/mL (ref 0.35–4.50)

## 2020-11-05 NOTE — Progress Notes (Signed)
11/05/2020 Patricia Cuevas [id number] 03/03/1951   HISTORY OF PRESENT ILLNESS: This is a 70 year old female who is a patient of Dr. Ardis Hughs who presents here today with complaints of constipation, lower abdominal pain, and black stools.  In regards to the constipation, she reports that she only has a couple of bowel movements a week.  She reports lower abdominal pain before bowel movements.  She describes it as sharp in nature.  She has had 3 CT scans performed in the last year, two with contrast in one without, that have shown no source of her pain.  She says the lower abdominal pain is only been present for a month, but her previous scans were done for indication of abdominal pain as well.  The most recent CT scan was just performed on September 24, 2020 through [facility] that showed no acute abnormalities in the abdomen and pelvis, with postoperative changes of the stomach and a solitary 5 mm calcified gallbladder calculus.  She also reports dark, tarry stools intermittently for the past 1.5 months or so.  She says it is not every day.  She denies any nausea or vomiting.  Looking back at previous office visits it looks like she was complaining of these dark stools previously as well.  She has history of gastric bypass surgery.  She has a family history of stomach cancer in her sister.  Hgb in 08/2020 was normal at 13.2 grams.  Colonoscopy November 2020 showed only very small hemorrhoids.  Her last EGD was in November 2011 at which time she was found to have a stricture at the GE junction, postoperative changes, question stenosis at the gastrojejunostomy.   Past Medical History:  Diagnosis Date  . Alcohol abuse    in the past, resolved  . Arthritis   . Asthma   . Blood transfusion without reported diagnosis 1984   after surgery  . Breast cancer [facility])    Double mastectomy 2007; s/p tamoxifen and arimidex therapy; no recurrence since 05/2008  . CAD (coronary artery disease)     Nuclear stress test Feb 11, 2011, EF 64%, no scar or ischemia    //         catheterization, November, 2011,patent LIMA-LAD-small caliber distal vessel with diffuse plaque, patent SVG to OM1 and OM 2, patent SVG to PDA and PLA, occluded SVG to diagonal, medical therapy;  Lexiscan Myoview 6/14:  Inferior thinning, no ischemia, EF 61%, low risk  . CHF (congestive heart failure) ([place])   . Chronic back pain   . Diabetes mellitus   . Dyslipidemia   . Ejection fraction    EF 65%, echo, [place], Jan 16, 2011  . Hx of CABG    2007  . Hx of colonic polyps   . Hyperlipidemia   . Hypertension   . Myocardial infarction ([place])   . Nausea & vomiting    hospitalization, November, 2011, stricture GE junction, questionable stenosis gastrojejunostomy, disruptive primary peristaltic wave, GE reflux, delayed emptying proximal gastric pouch  . Normal cardiac stress test 02/2013  . Overweight(278.02)    stomach stapling 1985 followed by weight loss, then return of weight  . Sciatica   . Sleep apnea    CPAP being arranged May, 2011//does not use CPAP  . Tobacco abuse    in the past, resolved  . UTI (urinary tract infection)    Past Surgical History:  Procedure Laterality Date  . ABDOMINAL HYSTERECTOMY  1989  . CARDIAC CATHETERIZATION N/A  12/20/2015   Procedure: Left Heart Cath and Cors/Grafts Angiography;  Surgeon: Leonie Man, MD;  Location: [place] [facility];  Service: Cardiovascular;  Laterality: N/A;  . CORONARY ARTERY BYPASS GRAFT  2009  . CORONARY STENT PLACEMENT    . GASTRIC BYPASS  1983   open.  [place]  . MASTECTOMY Bilateral 1987    reports that she quit smoking about 20 years ago. Her smoking use included cigarettes. She has a 81.00 pack-year smoking history. She has never used smokeless tobacco. She reports that she does not drink alcohol and does not use drugs. family history includes Asthma in her sister; Colon cancer in her father; Colon cancer (age of onset: 54) in her sister;  Diabetes in her mother, sister, and sister; Heart disease in her father, mother, and sister; Hypertension in her mother; Stomach cancer in her sister. Allergies  Allergen Reactions  . Latex Itching and Rash      Outpatient Encounter Medications as of 11/05/2020  Medication Sig  . albuterol (PROVENTIL HFA;VENTOLIN HFA) 108 (90 BASE) MCG/ACT inhaler Inhale 2 puffs into the lungs every 6 (six) hours as needed for shortness of breath.  [name] Kitchen aspirin EC 81 MG tablet Take 81 mg by mouth daily.   . cetirizine (ZYRTEC) 10 MG tablet Take 1 tablet (10 mg total) by mouth daily.  . cholecalciferol (VITAMIN D3) 25 MCG (1000 UT) tablet Take 1,000 Units by mouth daily.  . fluticasone (FLONASE) 50 MCG/ACT nasal spray Place 2 sprays into both nostrils daily. (Patient taking differently: Place 2 sprays into both nostrils daily as needed for allergies.)  . glipiZIDE (GLUCOTROL XL) 10 MG 24 hr tablet Take 1 tablet (10 mg total) by mouth daily with breakfast.  . HYDROcodone-acetaminophen (NORCO) 10-325 MG tablet Take 1 tablet by mouth 2 (two) times daily as needed for moderate pain.  [name] Kitchen HYDROcodone-acetaminophen (NORCO/VICODIN) 5-325 MG tablet Take 1 tablet by mouth every 6 (six) hours as needed for moderate pain or severe pain.  [name] Kitchen latanoprost (XALATAN) 0.005 % ophthalmic solution Place 1 drop into both eyes at bedtime.  [name] Kitchen lisinopril-hydrochlorothiazide (PRINZIDE,ZESTORETIC) 20-12.5 MG per tablet Take 1 tablet by mouth daily.  . metoprolol tartrate (LOPRESSOR) 25 MG tablet TAKE 1 TABLET BY MOUTH 2 TIMES A DAY.  . montelukast (SINGULAIR) 10 MG tablet Take 10 mg by mouth at bedtime.  . nitroGLYCERIN (NITROSTAT) 0.4 MG SL tablet Place 1 tablet (0.4 mg total) under the tongue every 5 (five) minutes as needed for chest pain.  . phenazopyridine (PYRIDIUM) 200 MG tablet Take 1 tablet (200 mg total) by mouth 3 (three) times daily as needed for pain.  . rosuvastatin (CRESTOR) 10 MG tablet Take 1 tablet (10 mg total) by mouth at  bedtime.   No facility-administered encounter medications on file as of 11/05/2020.     REVIEW OF SYSTEMS  : All other systems reviewed and negative except where noted in the History of Present Illness.   PHYSICAL EXAM: BP (!) 120/56   Pulse 60   Ht 4\' 9"  (1.448 m)   Wt 211 lb 3.2 oz (95.8 kg)   BMI 45.70 kg/m  General: Well developed AA female in no acute distress Head: Normocephalic and atraumatic Eyes:  Sclerae anicteric, conjunctiva pink. Ears: Normal auditory acuity Lungs: Clear throughout to auscultation; no W/R/R. Heart: Regular rate and rhythm; no M/R/G. Abdomen: Soft, non-distended.  BS present.  Non-tender. Musculoskeletal: Symmetrical with no gross deformities  Skin: No lesions on visible extremities Extremities: No edema  Neurological:  Alert oriented x 4, grossly non-focal Psychological:  Alert and cooperative. Normal mood and affect  ASSESSMENT AND PLAN: *Black stools, family history of stomach cancer: She has been here with these reports in the past.  This seems to be an intermittent issue.  Her last hemoglobin was normal.  I will repeat a CBC today.  With her family history we will plan for EGD with Dr. Ardis Hughs since last was in 2011.  The risks, benefits, and alternatives to EGD were discussed with the patient and she consents to proceed.  *Constipation: Longstanding.  Likely worsened by medication.  A advised her to begin starting MiraLAX a half capful daily and increasing/titrating it as needed.  Will check TSH today as well. *Lower abdominal pain: Likely related to her constipation.  I would like to treat the constipation and see if this improves.  She has had 3 CT scans in the past year (2 with contrast and one without) that have not shown any cause for pain.   CC:  [name], MD

## 2020-11-05 NOTE — Patient Instructions (Addendum)
If you are age 71 or older, your body mass index should be between 23-30. Your Body mass index is 45.7 kg/m. If this is out of the aforementioned range listed, please consider follow up with your Primary Care Provider.  If you are age 51 or younger, your body mass index should be between 19-25. Your Body mass index is 45.7 kg/m. If this is out of the aformentioned range listed, please consider follow up with your Primary Care Provider.   Your provider has requested that you go to the basement level for lab work before leaving today. Press "B" on the elevator. The lab is located at the first door on the left as you exit the elevator.  Start Miralax 1/2 capful daily and increase as needed.  You have been scheduled for an endoscopy. Please follow written instructions given to you at your visit today. If you use inhalers (even only as needed), please bring them with you on the day of your procedure.  Due to recent changes in healthcare laws, you may see the results of your imaging and laboratory studies on MyChart before your provider has had a chance to review them.  We understand that in some cases there may be results that are confusing or concerning to you. Not all laboratory results come back in the same time frame and the provider may be waiting for multiple results in order to interpret others.  Please give [place] 48 hours in order for your provider to thoroughly review all the results before contacting the office for clarification of your results.

## 2020-11-11 ENCOUNTER — Encounter: Payer: Self-pay | Admitting: Gastroenterology

## 2020-11-11 DIAGNOSIS — Z800 Family history of malignant neoplasm of digestive organs: Secondary | ICD-10-CM | POA: Insufficient documentation

## 2020-11-11 DIAGNOSIS — R195 Other fecal abnormalities: Secondary | ICD-10-CM | POA: Insufficient documentation

## 2020-11-11 DIAGNOSIS — R109 Unspecified abdominal pain: Secondary | ICD-10-CM | POA: Insufficient documentation

## 2020-11-12 NOTE — Progress Notes (Signed)
I agree with the above note, plan 

## 2020-11-13 ENCOUNTER — Telehealth: Payer: Self-pay | Admitting: Gastroenterology

## 2020-11-13 NOTE — Telephone Encounter (Signed)
Good morning Dr. Ardis Hughs, patient called stating she will be going out of town and canceled her procedure scheduled for 11/15/2020.

## 2020-11-15 ENCOUNTER — Encounter: Payer: Self-pay | Admitting: Gastroenterology

## 2020-12-30 ENCOUNTER — Emergency Department (HOSPITAL_COMMUNITY)
Admission: EM | Admit: 2020-12-30 | Discharge: 2020-12-31 | Disposition: A | Discharge status: Home / Self Care | Payer: Medicare Other | Attending: Emergency Medicine | Admitting: Emergency Medicine

## 2020-12-30 ENCOUNTER — Emergency Department (HOSPITAL_COMMUNITY): Payer: Medicare Other

## 2020-12-30 DIAGNOSIS — I110 Hypertensive heart disease with heart failure: Secondary | ICD-10-CM | POA: Insufficient documentation

## 2020-12-30 DIAGNOSIS — Z7984 Long term (current) use of oral hypoglycemic drugs: Secondary | ICD-10-CM | POA: Insufficient documentation

## 2020-12-30 DIAGNOSIS — J45909 Unspecified asthma, uncomplicated: Secondary | ICD-10-CM | POA: Insufficient documentation

## 2020-12-30 DIAGNOSIS — Z87891 Personal history of nicotine dependence: Secondary | ICD-10-CM | POA: Diagnosis not present

## 2020-12-30 DIAGNOSIS — Z91040 Latex allergy status: Secondary | ICD-10-CM | POA: Diagnosis not present

## 2020-12-30 DIAGNOSIS — I509 Heart failure, unspecified: Secondary | ICD-10-CM | POA: Diagnosis not present

## 2020-12-30 DIAGNOSIS — Z853 Personal history of malignant neoplasm of breast: Secondary | ICD-10-CM | POA: Diagnosis not present

## 2020-12-30 DIAGNOSIS — E119 Type 2 diabetes mellitus without complications: Secondary | ICD-10-CM | POA: Insufficient documentation

## 2020-12-30 DIAGNOSIS — R072 Precordial pain: Secondary | ICD-10-CM | POA: Insufficient documentation

## 2020-12-30 DIAGNOSIS — Z79899 Other long term (current) drug therapy: Secondary | ICD-10-CM | POA: Insufficient documentation

## 2020-12-30 DIAGNOSIS — R5383 Other fatigue: Secondary | ICD-10-CM | POA: Insufficient documentation

## 2020-12-30 DIAGNOSIS — Z7982 Long term (current) use of aspirin: Secondary | ICD-10-CM | POA: Diagnosis not present

## 2020-12-30 DIAGNOSIS — I2510 Atherosclerotic heart disease of native coronary artery without angina pectoris: Secondary | ICD-10-CM | POA: Diagnosis not present

## 2020-12-30 DIAGNOSIS — Z951 Presence of aortocoronary bypass graft: Secondary | ICD-10-CM | POA: Insufficient documentation

## 2020-12-30 DIAGNOSIS — R079 Chest pain, unspecified: Secondary | ICD-10-CM

## 2020-12-30 LAB — CBC WITH DIFFERENTIAL/PLATELET
Abs Immature Granulocytes: 0.01 10*3/uL (ref 0.00–0.07)
Basophils Absolute: 0 10*3/uL (ref 0.0–0.1)
Basophils Relative: 0 %
Eosinophils Absolute: 0.1 10*3/uL (ref 0.0–0.5)
Eosinophils Relative: 3 %
HCT: 41.4 % (ref 36.0–46.0)
Hemoglobin: 13.2 g/dL (ref 12.0–15.0)
Immature Granulocytes: 0 %
Lymphocytes Relative: 52 %
Lymphs Abs: 2.9 10*3/uL (ref 0.7–4.0)
MCH: 29 pg (ref 26.0–34.0)
MCHC: 31.9 g/dL (ref 30.0–36.0)
MCV: 91 fL (ref 80.0–100.0)
Monocytes Absolute: 0.5 10*3/uL (ref 0.1–1.0)
Monocytes Relative: 8 %
Neutro Abs: 2.1 10*3/uL (ref 1.7–7.7)
Neutrophils Relative %: 37 %
Platelets: 183 10*3/uL (ref 150–400)
RBC: 4.55 MIL/uL (ref 3.87–5.11)
RDW: 14.5 % (ref 11.5–15.5)
WBC: 5.7 10*3/uL (ref 4.0–10.5)
nRBC: 0 % (ref 0.0–0.2)

## 2020-12-30 LAB — HEPATIC FUNCTION PANEL
ALT: 14 U/L (ref 0–44)
AST: 27 U/L (ref 15–41)
Albumin: 3.5 g/dL (ref 3.5–5.0)
Alkaline Phosphatase: 66 U/L (ref 38–126)
Bilirubin, Direct: 0.2 mg/dL (ref 0.0–0.2)
Indirect Bilirubin: 0.2 mg/dL — ABNORMAL LOW (ref 0.3–0.9)
Total Bilirubin: 0.4 mg/dL (ref 0.3–1.2)
Total Protein: 6.8 g/dL (ref 6.5–8.1)

## 2020-12-30 LAB — BASIC METABOLIC PANEL
Anion gap: 7 (ref 5–15)
BUN: 6 mg/dL — ABNORMAL LOW (ref 8–23)
CO2: 27 mmol/L (ref 22–32)
Calcium: 9.1 mg/dL (ref 8.9–10.3)
Chloride: 104 mmol/L (ref 98–111)
Creatinine, Ser: 0.7 mg/dL (ref 0.44–1.00)
GFR, Estimated: >60 mL/min (ref >60)
Glucose, Bld: 146 mg/dL — ABNORMAL HIGH (ref 70–99)
Potassium: 4 mmol/L (ref 3.5–5.1)
Sodium: 138 mmol/L (ref 135–145)

## 2020-12-30 LAB — TROPONIN I (HIGH SENSITIVITY)
Troponin I (High Sensitivity): 4 ng/L (ref <18)
Troponin I (High Sensitivity): 4 ng/L (ref <18)

## 2020-12-30 LAB — LIPASE, BLOOD: Lipase: 35 U/L (ref 11–51)

## 2020-12-30 NOTE — ED Triage Notes (Signed)
Pt bibems for intermittent chest pain x3 hours. States pain is sharp.Pain is non radiating. Bilateral leg tingling. she's had cp like this a few months ago. Pt has hx of a heart attack pt got 324 aspirin and 1 sublingual nitro that took her pain from a 10 to a 6 with EMS.

## 2020-12-30 NOTE — ED Provider Notes (Signed)
Patricia Cuevas   CSN: [id number] Arrival date & time: 12/30/20  2012     History Chief Complaint  Patient presents with  . Chest Pain    Patricia Cuevas is a 70 y.o. female.  The history is provided by the patient.  Chest Pain Pain location:  Substernal area Pain quality: pressure   Pain severity:  Mild Onset quality:  Gradual Progression:  Resolved Chronicity:  New Context comment:  Walking and felt tingling in her legs but that resolved and then developed chest pain Relieved by:  Nitroglycerin Worsened by:  Exertion Associated symptoms: no abdominal pain, no anxiety, no back pain, no cough, no fever, no palpitations, no shortness of breath and no vomiting   Risk factors: coronary artery disease        Past Medical History:  Diagnosis Date  . Alcohol abuse    in the past, resolved  . Arthritis   . Asthma   . Blood transfusion without reported diagnosis 1984   after surgery  . Breast cancer [facility])    Double mastectomy 2007; s/p tamoxifen and arimidex therapy; no recurrence since 05/2008  . CAD (coronary artery disease)    Nuclear stress test Feb 11, 2011, EF 64%, no scar or ischemia    //         catheterization, November, 2011,patent LIMA-LAD-small caliber distal vessel with diffuse plaque, patent SVG to OM1 and OM 2, patent SVG to PDA and PLA, occluded SVG to diagonal, medical therapy;  Lexiscan Myoview 6/14:  Inferior thinning, no ischemia, EF 61%, low risk  . CHF (congestive heart failure) ([place])   . Chronic back pain   . Diabetes mellitus   . Dyslipidemia   . Ejection fraction    EF 65%, echo, [place], Jan 16, 2011  . Hx of CABG    2007  . Hx of colonic polyps   . Hyperlipidemia   . Hypertension   . Myocardial infarction ([place])   . Nausea & vomiting    hospitalization, November, 2011, stricture GE junction, questionable stenosis gastrojejunostomy, disruptive primary peristaltic wave, GE reflux, delayed  emptying proximal gastric pouch  . Normal cardiac stress test 02/2013  . Overweight(278.02)    stomach stapling 1985 followed by weight loss, then return of weight  . Sciatica   . Sleep apnea    CPAP being arranged May, 2011//does not use CPAP  . Tobacco abuse    in the past, resolved  . UTI (urinary tract infection)     Patient Active Problem List   Diagnosis Date Noted  . Dark stools 11/11/2020  . Family history of stomach cancer 11/11/2020  . Abdominal pain 11/11/2020  . Asthma 03/07/2017  . Sleep apnea 03/07/2017  . Diabetes mellitus type 2, controlled ([place]) 03/07/2017  . Hyperlipidemia 03/07/2017  . CAD (coronary artery disease) with prior CABG 03/07/2017  . UTI (urinary tract infection) 03/07/2017  . Sepsis ([place]) 03/07/2017  . Sepsis due to urinary tract infection ([place]) 03/07/2017  . HTN (hypertension) 03/07/2017  . Constipation 03/07/2017  . Abnormal nuclear stress test 12/20/2015  . Angina, class III ([place]) 12/20/2015  . Chest pain 11/12/2014  . Upper GI bleed 03/04/2013  . Atypical chest pain 01/07/2013  . History of tobacco abuse 01/07/2013  . SIRS (systemic inflammatory response syndrome) ([place]) 07/28/2012  . Tachycardia 07/28/2012  . GERD (gastroesophageal reflux disease) 02/24/2011  . Coronary artery disease involving native coronary artery with angina pectoris ([place])   .  Hx of CABG   . Overweight   . OSA (obstructive sleep apnea)   . Nausea & vomiting   . Breast cancer ([place])   . Hypertension   . Diabetes mellitus ([place])   . Dyslipidemia   . Ejection fraction     Past Surgical History:  Procedure Laterality Date  . ABDOMINAL HYSTERECTOMY  1989  . CARDIAC CATHETERIZATION N/A 12/20/2015   Procedure: Left Heart Cath and Cors/Grafts Angiography;  Surgeon: Leonie Man, MD;  Location: [place] [facility];  Service: Cardiovascular;  Laterality: N/A;  . CORONARY ARTERY BYPASS GRAFT  2009  . CORONARY STENT PLACEMENT    . GASTRIC BYPASS  1983   open.  [place]   . MASTECTOMY Bilateral 1987     OB History   No obstetric history on file.     Family History  Problem Relation Age of Onset  . Hypertension Mother   . Heart disease Mother   . Diabetes Mother   . Heart disease Father   . Colon cancer Father        does not know age of onset  . Heart disease Sister   . Colon cancer Sister 23  . Stomach cancer Sister   . Diabetes Sister   . Asthma Sister   . Diabetes Sister   . Esophageal cancer Neg Hx   . Rectal cancer Neg Hx     Social History   Tobacco Use  . Smoking status: Former Smoker    Packs/day: 3.00    Years: 27.00    Pack years: 81.00    Types: Cigarettes    Quit date: 09/14/2000    Years since quitting: 20.3  . Smokeless tobacco: Never Used  Vaping Use  . Vaping Use: Never used  Substance Use Topics  . Alcohol use: No  . Drug use: No    Home Medications Prior to Admission medications   Medication Sig Start Date End Date Taking? Authorizing Provider  albuterol (PROVENTIL HFA;VENTOLIN HFA) 108 (90 BASE) MCG/ACT inhaler Inhale 2 puffs into the lungs every 6 (six) hours as needed for shortness of breath.   Yes [provider]  aspirin EC 81 MG tablet Take 81 mg by mouth daily.    Yes [provider]  cetirizine (ZYRTEC) 10 MG tablet Take 1 tablet (10 mg total) by mouth daily. Patient taking differently: Take 10 mg by mouth daily as needed for allergies. 08/16/17  Yes Maczis, Barth Kirks, PA-C  cholecalciferol (VITAMIN D3) 25 MCG (1000 UT) tablet Take 1,000 Units by mouth daily.   Yes [provider]  fluticasone (FLONASE) 50 MCG/ACT nasal spray Place 2 sprays into both nostrils daily. Patient taking differently: Place 2 sprays into both nostrils daily as needed for allergies. 08/16/17  Yes Maczis, Barth Kirks, PA-C  glipiZIDE (GLUCOTROL) 10 MG tablet Take 10 mg by mouth every morning. 12/06/20  Yes [provider]  HYDROcodone-acetaminophen (NORCO) 10-325 MG tablet Take 1 tablet by mouth 2  (two) times daily as needed for moderate pain. 08/28/20  Yes [provider]  latanoprost (XALATAN) 0.005 % ophthalmic solution Place 1 drop into both eyes at bedtime as needed (dry eyes).   Yes [provider]  lisinopril-hydrochlorothiazide (PRINZIDE,ZESTORETIC) 20-12.5 MG per tablet Take 1 tablet by mouth daily.   Yes [provider]  metoprolol tartrate (LOPRESSOR) 25 MG tablet TAKE 1 TABLET BY MOUTH 2 TIMES A DAY. Patient taking differently: Take 25 mg by mouth 2 (two) times daily. 10/03/20  Yes [name], MD  montelukast (SINGULAIR) 10 MG tablet Take 10 mg by mouth at bedtime. 08/14/19  Yes [provider]  nitroGLYCERIN (NITROSTAT) 0.4 MG SL tablet Place 1 tablet (0.4 mg total) under the tongue every 5 (five) minutes as needed for chest pain. 01/15/17  Yes [name], MD  phenazopyridine (PYRIDIUM) 200 MG tablet Take 1 tablet (200 mg total) by mouth 3 (three) times daily as needed for pain. 08/10/20  Yes [name], MD  rosuvastatin (CRESTOR) 10 MG tablet Take 1 tablet (10 mg total) by mouth at bedtime. 08/26/18  Yes [name], MD  HYDROcodone-acetaminophen (NORCO/VICODIN) 5-325 MG tablet Take 1 tablet by mouth every 6 (six) hours as needed for moderate pain or severe pain. Patient not taking: Reported on 12/30/2020 08/10/20   [name], MD    Allergies    Latex  Review of Systems   Review of Systems  Constitutional: Negative for chills and fever.  HENT: Negative for ear pain and sore throat.   Eyes: Negative for pain and visual disturbance.  Respiratory: Negative for cough and shortness of breath.   Cardiovascular: Positive for chest pain. Negative for palpitations.  Gastrointestinal: Negative for abdominal pain and vomiting.  Genitourinary: Negative for dysuria and hematuria.  Musculoskeletal: Negative for arthralgias and back pain.  Skin: Negative for color change and rash.  Neurological: Negative for seizures and syncope.   All other systems reviewed and are negative.   Physical Exam Updated Vital Signs BP 116/69   Pulse 68   Temp 98.8 F (37.1 C) (Oral)   Resp (!) 22   Ht 4\' 10"  (1.473 m)   Wt 91.6 kg   SpO2 96%   BMI 42.22 kg/m   Physical Exam Vitals and nursing Cuevas reviewed.  Constitutional:      General: She is not in acute distress.    Appearance: She is well-developed. She is not ill-appearing.  HENT:     Head: Normocephalic and atraumatic.  Eyes:     Conjunctiva/sclera: Conjunctivae normal.     Pupils: Pupils are equal, round, and reactive to light.  Cardiovascular:     Rate and Rhythm: Normal rate and regular rhythm.     Pulses:          Radial pulses are 2+ on the right side and 2+ on the left side.       Dorsalis pedis pulses are 2+ on the right side and 2+ on the left side.     Heart sounds: Normal heart sounds. No murmur heard.   Pulmonary:     Effort: Pulmonary effort is normal. No respiratory distress.     Breath sounds: Normal breath sounds. No decreased breath sounds, wheezing or rhonchi.  Abdominal:     Palpations: Abdomen is soft.     Tenderness: There is no abdominal tenderness.  Musculoskeletal:        General: Normal range of motion.     Cervical back: Normal range of motion and neck supple.     Right lower leg: No edema.     Left lower leg: No edema.  Skin:    General: Skin is warm and dry.     Capillary Refill: Capillary refill takes less than 2 seconds.  Neurological:     General: No focal deficit present.     Mental Status: She is alert.  Psychiatric:        Mood and Affect: Mood normal.     ED Results / Procedures /  Treatments   Labs (all labs ordered are listed, but only abnormal results are displayed) Labs Reviewed  BASIC METABOLIC PANEL - Abnormal; Notable for the following components:      Result Value   Glucose, Bld 146 (*)    BUN 6 (*)    All other components within normal limits  HEPATIC FUNCTION PANEL - Abnormal; Notable for the  following components:   Indirect Bilirubin 0.2 (*)    All other components within normal limits  CBC WITH DIFFERENTIAL/PLATELET  LIPASE, BLOOD  TROPONIN I (HIGH SENSITIVITY)  TROPONIN I (HIGH SENSITIVITY)    EKG EKG Interpretation  Date/Time:  Monday December 30 2020 20:24:32 EDT Ventricular Rate:  58 PR Interval:  135 QRS Duration: 92 QT Interval:  496 QTC Calculation: 488 R Axis:   34 Text Interpretation: Sinus arrhythmia Nonspecific T abnormalities, anterior leads Borderline prolonged QT interval Confirmed by Lennice Sites ([id number]) on 12/30/2020 8:33:43 PM   Radiology DG Chest Portable 1 View  Result Date: 12/30/2020 CLINICAL DATA:  Chest pain EXAM: PORTABLE CHEST 1 VIEW COMPARISON:  12/02/2019 FINDINGS: Elevation of the right hemidiaphragm. Heart is normal size. Prior CABG. No confluent opacities, effusions or edema. IMPRESSION: Stable chronic elevation of the right hemidiaphragm. No active disease. Electronically Signed   By: Rolm Baptise M.D.   On: 12/30/2020 20:55    Procedures Procedures   Medications Ordered in ED Medications - No data to display  ED Course  I have reviewed the triage vital signs and the nursing notes.  Pertinent labs & imaging results that were available during my care of the patient were reviewed by me and considered in my medical decision making (see chart for details).    MDM Rules/Calculators/A&P                          MIKAYLEE ARSENEAU is a 70 year old female with history of CAD, tobacco abuse, alcohol abuse, heart failure, breast cancer in remission, diabetes, hypertension who presents to the ED with chest pain.  Patient with normal vitals.  No fever.  Chest pain has improved following nitroglycerin x2 and aspirin x4.  Patient states that she was walking and had some tingling in her feet but that went away quickly and then she developed some chest heaviness.  Eventually resolved with nitro.  She states that she has not had any recent chest pain.   However she has noticed that with minimal exertion she has had fatigue.  She denies any shortness of breath, cough or infectious symptoms.  She is not hypoxic or tachycardic.  Have a low suspicion for PE.  EKG shows sinus rhythm.  Unchanged from prior EKGs.  Initial troponin is unremarkable.  No significant anemia, electrolyte abnormality, kidney injury otherwise.  Chest x-ray unremarkable for infectious process.  No pneumothorax.  Overall she does have significant cardiac history.  Will discuss with cardiology about possible admission.  Heart cath was last in 2017.  Repeat troponin unremarkable.  Talk to cardiology who recommends outpatient follow-up.  Overall reassuring recent cath.  Not having any active pain.  Patient understands return precautions.  Discharged in ED in good condition.  This chart was dictated using voice recognition software.  Despite best efforts to proofread,  errors can occur which can change the documentation meaning.    Final Clinical Impression(s) / ED Diagnoses Final diagnoses:  Chest pain, unspecified type    Rx / DC Orders ED Discharge Orders    None  Lennice Sites, DO 12/30/20 [id number]

## 2020-12-30 NOTE — Discharge Instructions (Addendum)
Overall work-up today was unremarkable.  Please return if you develop any worsening symptoms as discussed.  Follow-up with your cardiologist.

## 2021-02-11 NOTE — Progress Notes (Deleted)
Cardiology Office Note:    Date:  02/11/2021   ID:  Patricia Cuevas, DOB [birth year], MRN [id number]  PCP:  Patricia Axe, MD   [facility] Providers Cardiologist:  Patricia Furbish, MD { Click to update primary MD,subspecialty MD or APP then REFRESH:1}  ***  Referring MD: Patricia Axe, MD   Chief Complaint:  No chief complaint on file.    Patient Profile:    Patricia Cuevas is a 70 y.o. female with:   Coronary artery disease  S/p CABG in 2009  Cath 4/17: 4/5 grafts patent; stable anatomy - Med Rx   Diabetes mellitus   Hypertension   Hyperlipidemia   Breast CA s/p bilat mastectomies   GERD   OSA   Asthma   +Cigs   Prior CV studies:  LEFT HEART CATH AND CORS/GRAFTS ANGIOGRAPHY 12/20/2015 Narrative  Prox RCA to Mid RCA lesion, 55% stenosed. Mid RCA to Dist RCA lesion, 100% stenosed. Mild disease of the Ostial-Prox SVG-dRCA(filling PLA & PDA).  Mid LAD lesion, 100% stenosed. LIMA-LAD is patent, but very small to a small distal LAD. Very tortuous graft - not a good option to attempt PCI through.  Prox Cx to Mid Cx lesion, 100% stenosed. The lesion was previously treated with a stent (unknown type) greater than two years ago. Mid Cx lesion, 100% stenosed. Patent SVG-OM2 & OM3.  Origin to Prox SVG-Diag Graft lesion, 100% stenosed. Known.  Sequential SVG-OM2-OM3 was injected is large & widely patent.  The left ventricular systolic function is normal. No real change in anatomy from 2011 catheterization. No culprit lesion to explain the patient's stress test. Normal EF with normal EDP and stable coronary arteries. She should be at low risk from a cardiovascular standpoint for her surgery from a revascularization standpoint.   GATED SPECT MYO PERF W/LEXISCAN STRESS 1D 12/13/2015 Narrative  Nuclear stress EF: 63%.  There was no ST segment deviation noted during stress.  Defect 1: There is a medium defect of mild severity present in the basal inferolateral, mid  inferior and mid inferolateral location.  Findings consistent with ischemia.  This is an intermediate risk study.  The left ventricular ejection fraction is normal (55-65%). There is medium size area mild severity ischemia in the basal and mid inferolateral and mid inferior walls (SDS 3).   Echocardiogram 01/16/2011 [facility]) EF 65, no RWMA, mild LVH, GR 1 DD, mild LAE, trace MR, trace TR  History of Present Illness: Patricia Cuevas was last seen in 12/21 by Patricia Cuevas.  She has been to the emergency room twice since then with chest discomfort.  Last visit 12/30/2020.  High-sensitivity troponins were normal.  Chest x-ray was unremarkable.  EKG did not demonstrate any ischemic changes.  She returns for follow-up.***    Past Medical History:  Diagnosis Date  . Alcohol abuse    in the past, resolved  . Arthritis   . Asthma   . Blood transfusion without reported diagnosis 1984   after surgery  . Breast cancer [facility])    Double mastectomy 2007; s/p tamoxifen and arimidex therapy; no recurrence since 05/2008  . CAD (coronary artery disease)    Nuclear stress test Feb 11, 2011, EF 64%, no scar or ischemia    //         catheterization, November, 2011,patent LIMA-LAD-small caliber distal vessel with diffuse plaque, patent SVG to OM1 and OM 2, patent SVG to PDA and PLA, occluded SVG to diagonal, medical therapy;  Lexiscan Myoview 6/14:  Inferior thinning, no ischemia, EF 61%, low risk  . CHF (congestive heart failure) ([place])   . Chronic back pain   . Diabetes mellitus   . Dyslipidemia   . Ejection fraction    EF 65%, echo, [place], Jan 16, 2011  . Hx of CABG    2007  . Hx of colonic polyps   . Hyperlipidemia   . Hypertension   . Myocardial infarction ([place])   . Nausea & vomiting    hospitalization, November, 2011, stricture GE junction, questionable stenosis gastrojejunostomy, disruptive primary peristaltic wave, GE reflux, delayed emptying proximal gastric pouch  . Normal cardiac  stress test 02/2013  . Overweight(278.02)    stomach stapling 1985 followed by weight loss, then return of weight  . Sciatica   . Sleep apnea    CPAP being arranged May, 2011//does not use CPAP  . Tobacco abuse    in the past, resolved  . UTI (urinary tract infection)     Current Medications: No outpatient medications have been marked as taking for the 02/12/21 encounter (Appointment) with Patricia Dopp T, PA-C.     Allergies:   Latex   Social History   Tobacco Use  . Smoking status: Former Smoker    Packs/day: 3.00    Years: 27.00    Pack years: 81.00    Types: Cigarettes    Quit date: 09/14/2000    Years since quitting: 20.4  . Smokeless tobacco: Never Used  Vaping Use  . Vaping Use: Never used  Substance Use Topics  . Alcohol use: No  . Drug use: No     Family Hx: The patient's family history includes Asthma in her sister; Colon cancer in her father; Colon cancer (age of onset: 60) in her sister; Diabetes in her mother, sister, and sister; Heart disease in her father, mother, and sister; Hypertension in her mother; Stomach cancer in her sister. There is no history of Esophageal cancer or Rectal cancer.  ROS   EKGs/Labs/Other Test Reviewed:    EKG:  EKG is *** ordered today.  The ekg ordered today demonstrates ***  Recent Labs: 11/05/2020: TSH 1.05 12/30/2020: ALT 14; BUN 6; Creatinine, Ser 0.70; Hemoglobin 13.2; Platelets 183; Potassium 4.0; Sodium 138   Recent Lipid Panel Lab Results  Component Value Date/Time   CHOL 158 11/15/2018 03:50 PM   TRIG 138 11/15/2018 03:50 PM   HDL 46 11/15/2018 03:50 PM   LDLCALC 84 11/15/2018 03:50 PM      Risk Assessment/Calculations:   {Does this patient have ATRIAL FIBRILLATION?:[phone]}  Physical Exam:    VS:  There were no vitals taken for this visit.    Wt Readings from Last 3 Encounters:  12/30/20 202 lb (91.6 kg)  11/05/20 211 lb 3.2 oz (95.8 kg)  08/26/20 207 lb (93.9 kg)     Physical Exam ***      ASSESSMENT & PLAN:    ***  {Are you ordering a CV Procedure (e.g. stress test, cath, DCCV, TEE, etc)?   Press F2        :[id number]}    Dispo:  No follow-ups on file.   Medication Adjustments/Labs and Tests Ordered: Current medicines are reviewed at length with the patient today.  Concerns regarding medicines are outlined above.  Tests Ordered: No orders of the defined types were placed in this encounter.  Medication Changes: No orders of the defined types were placed in this encounter.   Signed, Patricia Dopp, PA-C  02/11/2021 1:55 PM  [facility] [address], [place], [place]  [address] Phone: [phone]; Fax: [phone]

## 2021-02-12 ENCOUNTER — Ambulatory Visit: Payer: Medicare Other | Admitting: Physician Assistant

## 2021-02-12 DIAGNOSIS — E782 Mixed hyperlipidemia: Secondary | ICD-10-CM

## 2021-02-12 DIAGNOSIS — I10 Essential (primary) hypertension: Secondary | ICD-10-CM

## 2021-02-12 DIAGNOSIS — I25119 Atherosclerotic heart disease of native coronary artery with unspecified angina pectoris: Secondary | ICD-10-CM

## 2021-02-21 ENCOUNTER — Emergency Department (HOSPITAL_COMMUNITY): Payer: Medicare Other

## 2021-02-21 ENCOUNTER — Other Ambulatory Visit: Payer: Self-pay

## 2021-02-21 ENCOUNTER — Emergency Department (HOSPITAL_COMMUNITY)
Admission: EM | Admit: 2021-02-21 | Discharge: 2021-02-21 | Disposition: A | Discharge status: Home / Self Care | Payer: Medicare Other | Attending: Emergency Medicine | Admitting: Emergency Medicine

## 2021-02-21 DIAGNOSIS — R079 Chest pain, unspecified: Secondary | ICD-10-CM

## 2021-02-21 DIAGNOSIS — I110 Hypertensive heart disease with heart failure: Secondary | ICD-10-CM | POA: Diagnosis not present

## 2021-02-21 DIAGNOSIS — J45909 Unspecified asthma, uncomplicated: Secondary | ICD-10-CM | POA: Diagnosis not present

## 2021-02-21 DIAGNOSIS — R0789 Other chest pain: Secondary | ICD-10-CM | POA: Diagnosis present

## 2021-02-21 DIAGNOSIS — R0602 Shortness of breath: Secondary | ICD-10-CM | POA: Insufficient documentation

## 2021-02-21 DIAGNOSIS — Z87891 Personal history of nicotine dependence: Secondary | ICD-10-CM | POA: Diagnosis not present

## 2021-02-21 DIAGNOSIS — Z951 Presence of aortocoronary bypass graft: Secondary | ICD-10-CM | POA: Diagnosis not present

## 2021-02-21 DIAGNOSIS — I2510 Atherosclerotic heart disease of native coronary artery without angina pectoris: Secondary | ICD-10-CM | POA: Insufficient documentation

## 2021-02-21 DIAGNOSIS — E119 Type 2 diabetes mellitus without complications: Secondary | ICD-10-CM | POA: Diagnosis not present

## 2021-02-21 DIAGNOSIS — I509 Heart failure, unspecified: Secondary | ICD-10-CM | POA: Diagnosis not present

## 2021-02-21 DIAGNOSIS — Z955 Presence of coronary angioplasty implant and graft: Secondary | ICD-10-CM | POA: Diagnosis not present

## 2021-02-21 DIAGNOSIS — R202 Paresthesia of skin: Secondary | ICD-10-CM | POA: Insufficient documentation

## 2021-02-21 DIAGNOSIS — Z853 Personal history of malignant neoplasm of breast: Secondary | ICD-10-CM | POA: Insufficient documentation

## 2021-02-21 LAB — BASIC METABOLIC PANEL
Anion gap: 8 (ref 5–15)
BUN: 10 mg/dL (ref 8–23)
CO2: 28 mmol/L (ref 22–32)
Calcium: 9.4 mg/dL (ref 8.9–10.3)
Chloride: 102 mmol/L (ref 98–111)
Creatinine, Ser: 0.95 mg/dL (ref 0.44–1.00)
GFR, Estimated: >60 mL/min (ref >60)
Glucose, Bld: 143 mg/dL — ABNORMAL HIGH (ref 70–99)
Potassium: 3.9 mmol/L (ref 3.5–5.1)
Sodium: 138 mmol/L (ref 135–145)

## 2021-02-21 LAB — CBC
HCT: 40.1 % (ref 36.0–46.0)
Hemoglobin: 12.6 g/dL (ref 12.0–15.0)
MCH: 28.4 pg (ref 26.0–34.0)
MCHC: 31.4 g/dL (ref 30.0–36.0)
MCV: 90.3 fL (ref 80.0–100.0)
Platelets: 165 10*3/uL (ref 150–400)
RBC: 4.44 MIL/uL (ref 3.87–5.11)
RDW: 14.4 % (ref 11.5–15.5)
WBC: 5.7 10*3/uL (ref 4.0–10.5)
nRBC: 0 % (ref 0.0–0.2)

## 2021-02-21 LAB — TROPONIN I (HIGH SENSITIVITY)
Troponin I (High Sensitivity): 5 ng/L (ref <18)
Troponin I (High Sensitivity): 5 ng/L (ref <18)

## 2021-02-21 NOTE — ED Provider Notes (Signed)
[facility] Emergency Department Provider Note MRN:  [id number]  Arrival date & time: 02/21/21     Chief Complaint   Chest Pain   History of Present Illness   Patricia Cuevas is a 70 y.o. year-old female with a history of CAD, breast cancer, CHF, diabetes presenting to the ED with chief complaint of chest pain.  Location: Center and left side of the chest; also with paresthesia to left arm Duration: Began 45 minutes prior to arrival Onset: Sudden Timing: Constant Description: Pressure Severity: Moderate Exacerbating/Alleviating Factors: Improved with home oxycodone and nitroglycerin Associated Symptoms: Some shortness of breath Pertinent Negatives: Denies dizziness or diaphoresis, no nausea vomiting, no recent fever or cough   Review of Systems  A complete 10 system review of systems was obtained and all systems are negative except as noted in the HPI and PMH.   Patient's Health History    Past Medical History:  Diagnosis Date   Alcohol abuse    in the past, resolved   Arthritis    Asthma    Blood transfusion without reported diagnosis 1984   after surgery   Breast cancer ([place])    Double mastectomy 2007; s/p tamoxifen and arimidex therapy; no recurrence since 05/2008   CAD (coronary artery disease)    Nuclear stress test Feb 11, 2011, EF 64%, no scar or ischemia    //         catheterization, November, 2011,patent LIMA-LAD-small caliber distal vessel with diffuse plaque, patent SVG to OM1 and OM 2, patent SVG to PDA and PLA, occluded SVG to diagonal, medical therapy;  [place] 6/14:  Inferior thinning, no ischemia, EF 61%, low risk   CHF (congestive heart failure) (H[facility])    Chronic back pain    Diabetes mellitus    Dyslipidemia    Ejection fraction    EF 65%, echo, [place], Jan 16, 2011   Hx of CABG    2007   Hx of colonic polyps    Hyperlipidemia    Hypertension    Myocardial infarction [facility])    Nausea & vomiting     hospitalization, November, 2011, stricture GE junction, questionable stenosis gastrojejunostomy, disruptive primary peristaltic wave, GE reflux, delayed emptying proximal gastric pouch   Normal cardiac stress test 02/2013   Overweight(278.02)    stomach stapling 1985 followed by weight loss, then return of weight   Sciatica    Sleep apnea    CPAP being arranged May, 2011//does not use CPAP   Tobacco abuse    in the past, resolved   UTI (urinary tract infection)     Past Surgical History:  Procedure Laterality Date   [place] N/A 12/20/2015   Procedure: Left Heart Cath and Cors/Grafts Angiography;  Surgeon: Leonie Man, MD;  Location: [facility];  Service: Cardiovascular;  Laterality: N/A;   CORONARY ARTERY BYPASS GRAFT  2009   CORONARY STENT PLACEMENT     GASTRIC BYPASS  1983   open.  [place]   MASTECTOMY Bilateral 1987    Family History  Problem Relation Age of Onset   Hypertension Mother    Heart disease Mother    Diabetes Mother    Heart disease Father    Colon cancer Father        does not know age of onset   Heart disease Sister    Colon cancer Sister 83   Stomach cancer Sister    Diabetes  Sister    Asthma Sister    Diabetes Sister    Esophageal cancer Neg Hx    Rectal cancer Neg Hx     Social History   Socioeconomic History   Marital status: Single    Spouse name: Not on file   Number of children: Not on file   Years of education: Not on file   Highest education level: Not on file  Occupational History   Occupation: Disabled  Tobacco Use   Smoking status: Former    Packs/day: 3.00    Years: 27.00    Pack years: 81.00    Types: Cigarettes    Quit date: 09/14/2000    Years since quitting: 20.4   Smokeless tobacco: Never  Vaping Use   Vaping Use: Never used  Substance and Sexual Activity   Alcohol use: No   Drug use: No   Sexual activity: Not on file  Other Topics Concern   Not on file  Social  History Narrative   Not on file   Social Determinants of Health   Financial Resource Strain: Not on file  Food Insecurity: Not on file  Transportation Needs: Not on file  Physical Activity: Not on file  Stress: Not on file  Social Connections: Not on file  Intimate Partner Violence: Not on file     Physical Exam   Vitals:   02/21/21 0700 02/21/21 0730  BP: (!) 111/53 111/68  Pulse: 65 65  Resp: (!) 26 (!) 22  Temp:    SpO2: 96% 96%    CONSTITUTIONAL: Well-appearing, NAD NEURO:  Alert and oriented x 3, no focal deficits EYES:  eyes equal and reactive ENT/NECK:  no LAD, no JVD CARDIO: Regular rate, well-perfused, normal S1 and S2 PULM:  CTAB no wheezing or rhonchi GI/GU:  normal bowel sounds, non-distended, non-tender MSK/SPINE:  No gross deformities, no edema SKIN:  no rash, atraumatic PSYCH:  Appropriate speech and behavior  *Additional and/or pertinent findings included in MDM below  Diagnostic and Interventional Summary    EKG Interpretation  Date/Time:  Friday February 21 2021 04:17:33 EDT Ventricular Rate:  69 PR Interval:  170 QRS Duration: 83 QT Interval:  421 QTC Calculation: 451 R Axis:   172 Text Interpretation: Right and left arm electrode reversal, interpretation assumes no reversal Sinus rhythm Multiple ventricular premature complexes Probable lateral infarct, age indeterminate Confirmed by Gerlene Fee [phone]) on 02/21/2021 5:24:00 AM        Labs Reviewed  BASIC METABOLIC PANEL - Abnormal; Notable for the following components:      Result Value   Glucose, Bld 143 (*)    All other components within normal limits  CBC  TROPONIN I (HIGH SENSITIVITY)  TROPONIN I (HIGH SENSITIVITY)    DG Chest 2 View  Final Result      Medications - No data to display   Procedures  /  Critical Care Procedures  ED Course and Medical Decision Making  I have reviewed the triage vital signs, the nursing notes, and pertinent available records from the  EMR.  Listed above are laboratory and imaging tests that I personally ordered, reviewed, and interpreted and then considered in my medical decision making (see below for details).  History of CAD and CABG here with chest pain, typical in nature.  Improved after nitroglycerin at home.  EKG is without ischemic features, awaiting troponin.  May need admission.     Troponin is negative x2.  Upon further chart review, patient had a similar  presentation back in April and cardiology was consulted, recommending discharge and cardiology follow-up.  Her pain is resolved, she has no complaints at this time, she has cardiology follow-up scheduled over the next week or 2.  I discussed options with patient and offered admission.  She prefers outpatient follow-up.  Appropriate for discharge  Barth Kirks. Sedonia Small, [place] [email or web address]  Final Clinical Impressions(s) / ED Diagnoses     ICD-10-CM   1. Chest pain, unspecified type  R07.9       ED Discharge Orders     None        Discharge Instructions Discussed with and Provided to Patient:     Discharge Instructions      You were evaluated in the Emergency Department and after careful evaluation, we did not find any emergent condition requiring admission or further testing in the hospital.  Your exam/testing today was overall reassuring.  Your test did not show any signs of heart attack or heart damage.  Please keep your follow-up with your cardiologist.  Please return to the Emergency Department if you experience any worsening of your condition.  Thank you for allowing [place] to be a part of your care.         Maudie Flakes, MD 02/21/21 [phone]

## 2021-02-21 NOTE — ED Triage Notes (Addendum)
Pt arrived via [facility] for cc of chest pain. Pt has extensive cardiac history including open heart surgery and MI. Pt reports acute onset center chest pain that woke her from sleep. Pt took 1 nitro with some relief. Pt then began to experience numbness and tingling radiating to neck and down left arm that continues now. Chest pain is currently 9/10.

## 2021-02-21 NOTE — Discharge Instructions (Addendum)
You were evaluated in the Emergency Department and after careful evaluation, we did not find any emergent condition requiring admission or further testing in the hospital.  Your exam/testing today was overall reassuring.  Your test did not show any signs of heart attack or heart damage.  Please keep your follow-up with your cardiologist.  Please return to the Emergency Department if you experience any worsening of your condition.  Thank you for allowing [place] to be a part of your care.

## 2021-04-15 ENCOUNTER — Encounter: Payer: Self-pay | Admitting: Gastroenterology

## 2021-05-01 ENCOUNTER — Other Ambulatory Visit: Payer: Self-pay

## 2021-05-01 ENCOUNTER — Emergency Department (HOSPITAL_COMMUNITY): Payer: Medicare Other

## 2021-05-01 ENCOUNTER — Emergency Department (HOSPITAL_COMMUNITY)
Admission: EM | Admit: 2021-05-01 | Discharge: 2021-05-02 | Disposition: A | Discharge status: Home / Self Care | Payer: Medicare Other | Attending: Emergency Medicine | Admitting: Emergency Medicine

## 2021-05-01 DIAGNOSIS — Z91040 Latex allergy status: Secondary | ICD-10-CM | POA: Insufficient documentation

## 2021-05-01 DIAGNOSIS — E1169 Type 2 diabetes mellitus with other specified complication: Secondary | ICD-10-CM | POA: Diagnosis not present

## 2021-05-01 DIAGNOSIS — Z79899 Other long term (current) drug therapy: Secondary | ICD-10-CM | POA: Diagnosis not present

## 2021-05-01 DIAGNOSIS — E785 Hyperlipidemia, unspecified: Secondary | ICD-10-CM | POA: Diagnosis not present

## 2021-05-01 DIAGNOSIS — I2510 Atherosclerotic heart disease of native coronary artery without angina pectoris: Secondary | ICD-10-CM | POA: Diagnosis not present

## 2021-05-01 DIAGNOSIS — Z955 Presence of coronary angioplasty implant and graft: Secondary | ICD-10-CM | POA: Insufficient documentation

## 2021-05-01 DIAGNOSIS — J45909 Unspecified asthma, uncomplicated: Secondary | ICD-10-CM | POA: Diagnosis not present

## 2021-05-01 DIAGNOSIS — R072 Precordial pain: Secondary | ICD-10-CM | POA: Diagnosis not present

## 2021-05-01 DIAGNOSIS — R079 Chest pain, unspecified: Secondary | ICD-10-CM

## 2021-05-01 DIAGNOSIS — Z951 Presence of aortocoronary bypass graft: Secondary | ICD-10-CM | POA: Insufficient documentation

## 2021-05-01 DIAGNOSIS — Z7982 Long term (current) use of aspirin: Secondary | ICD-10-CM | POA: Diagnosis not present

## 2021-05-01 DIAGNOSIS — Z853 Personal history of malignant neoplasm of breast: Secondary | ICD-10-CM | POA: Diagnosis not present

## 2021-05-01 DIAGNOSIS — Z7984 Long term (current) use of oral hypoglycemic drugs: Secondary | ICD-10-CM | POA: Insufficient documentation

## 2021-05-01 DIAGNOSIS — Z87891 Personal history of nicotine dependence: Secondary | ICD-10-CM | POA: Diagnosis not present

## 2021-05-01 LAB — COMPREHENSIVE METABOLIC PANEL
ALT: 19 U/L (ref 0–44)
AST: 22 U/L (ref 15–41)
Albumin: 3.4 g/dL — ABNORMAL LOW (ref 3.5–5.0)
Alkaline Phosphatase: 67 U/L (ref 38–126)
Anion gap: 8 (ref 5–15)
BUN: 14 mg/dL (ref 8–23)
CO2: 29 mmol/L (ref 22–32)
Calcium: 9.6 mg/dL (ref 8.9–10.3)
Chloride: 101 mmol/L (ref 98–111)
Creatinine, Ser: 0.86 mg/dL (ref 0.44–1.00)
GFR, Estimated: >60 mL/min (ref >60)
Glucose, Bld: 149 mg/dL — ABNORMAL HIGH (ref 70–99)
Potassium: 4.2 mmol/L (ref 3.5–5.1)
Sodium: 138 mmol/L (ref 135–145)
Total Bilirubin: 0.4 mg/dL (ref 0.3–1.2)
Total Protein: 6.8 g/dL (ref 6.5–8.1)

## 2021-05-01 LAB — CBC WITH DIFFERENTIAL/PLATELET
Abs Immature Granulocytes: 0.07 10*3/uL (ref 0.00–0.07)
Basophils Absolute: 0 10*3/uL (ref 0.0–0.1)
Basophils Relative: 0 %
Eosinophils Absolute: 0 10*3/uL (ref 0.0–0.5)
Eosinophils Relative: 0 %
HCT: 39.9 % (ref 36.0–46.0)
Hemoglobin: 12.6 g/dL (ref 12.0–15.0)
Immature Granulocytes: 1 %
Lymphocytes Relative: 27 %
Lymphs Abs: 3.4 10*3/uL (ref 0.7–4.0)
MCH: 28.4 pg (ref 26.0–34.0)
MCHC: 31.6 g/dL (ref 30.0–36.0)
MCV: 90.1 fL (ref 80.0–100.0)
Monocytes Absolute: 1.1 10*3/uL — ABNORMAL HIGH (ref 0.1–1.0)
Monocytes Relative: 9 %
Neutro Abs: 8.1 10*3/uL — ABNORMAL HIGH (ref 1.7–7.7)
Neutrophils Relative %: 63 %
Platelets: 221 10*3/uL (ref 150–400)
RBC: 4.43 MIL/uL (ref 3.87–5.11)
RDW: 14.6 % (ref 11.5–15.5)
WBC: 12.7 10*3/uL — ABNORMAL HIGH (ref 4.0–10.5)
nRBC: 0 % (ref 0.0–0.2)

## 2021-05-01 LAB — TROPONIN I (HIGH SENSITIVITY)
Troponin I (High Sensitivity): 6 ng/L (ref <18)
Troponin I (High Sensitivity): 7 ng/L (ref <18)

## 2021-05-01 MED ORDER — ASPIRIN 81 MG PO CHEW
324.0000 mg | CHEWABLE_TABLET | Freq: Once | ORAL | Status: AC
  Administered 2021-05-01: 324 mg via ORAL
  Filled 2021-05-01: qty 4

## 2021-05-01 MED ORDER — DICLOFENAC SODIUM 1 % EX GEL: 4.0000 g | Freq: Four times a day (QID) | CUTANEOUS | 0 refills | Status: DC

## 2021-05-01 NOTE — ED Triage Notes (Signed)
Pt reports intermittent central chest pain and R arm tingling, always presenting together, since Sunday. Evaluated for same at [facility] Sunday but symptoms recurred today so here for further eval. Last episode today at 1415.

## 2021-05-01 NOTE — Discharge Instructions (Addendum)
Use the gel as prescribed.  Take tylenol 1000mg(2 extra strength) four times a day.      

## 2021-05-01 NOTE — ED Provider Notes (Signed)
Emergency Medicine Provider Triage Evaluation Note  Patricia Cuevas , a 70 y.o. female  was evaluated in triage.  Pt complains of chest pain and right arm tingling.  Pain is midsternal and does not radiate.  Pain is described as sharp pain.  Pain is not associated with nausea, vomiting, diaphoresis or shortness of breath.  Pain occurred simultaneously with tingling sensation to right arm.  Both pain and paresthesia lasted for few seconds and then resolved.  Patient last experienced this at 1430 this afternoon.  Patient reports similar episodes of pain and paresthesia on Sunday, per chart review patient was seen at [facility] on 8/15.  Patient denies any numbness, weakness, facial asymmetry, aphasia, dysphagia, headache, visual disturbance.  Review of Systems  Positive: Chest pain, tingling sensation to right upper extremity Negative: numbness, weakness, facial asymmetry, aphasia, dysphagia, headache, visual disturbance, shortness of breath, nausea, vomiting, palpitations, diaphoresis  Physical Exam  BP 131/65 (BP Location: Right Arm)   Pulse 63   Temp 98.3 F (36.8 C) (Oral)   Resp 18   SpO2 99%  Gen:   Awake, no distress   Resp:  Normal effort  MSK:   Moves extremities without difficulty  Other:  No facial asymmetry, grip strength equal, negative pronator drift.  Sensation to light touch intact to bilateral upper and lower extremities. Intact bilaterally, pupils PERRL.  +2 radial pulse bilaterally.  Medical Decision Making  Medically screening exam initiated at 5:13 PM.  Appropriate orders placed.  Patricia Cuevas was informed that the remainder of the evaluation will be completed by another provider, this initial triage assessment does not replace that evaluation, and the importance of remaining in the ED until their evaluation is complete.  The patient appears stable so that the remainder of the work up may be completed by another provider.      Loni Beckwith,  PA-C [phone]    Lianne Cure, DO [phone]

## 2021-05-02 NOTE — ED Notes (Signed)
Pt discharged and wheeled out of the ED in a wheel chair without difficulty. 

## 2021-05-02 NOTE — ED Provider Notes (Signed)
[facility] EMERGENCY DEPARTMENT Provider Note   CSN: [id number] Arrival date & time: 05/01/21  1631     History Chief Complaint  Patient presents with   Chest Pain   Tingling    Patricia Cuevas is a 70 y.o. female.  70 yo F with a chief complaints of chest pain.  This is been going on for a couple days.  She went to [facility] and had a work-up done and was discharged.  Saw her family doctor yesterday.  Had pain recur this afternoon.  Started at rest.  Started in the center of her chest and radiated to the right shoulder.  Worse with certain positions and deep breathing.  History of a DVT about 8 years ago.  The history is provided by the patient.  Chest Pain Pain location:  Substernal area Pain radiates to:  Does not radiate Pain severity:  Moderate Onset quality:  Gradual Duration:  3 hours Timing:  Constant Progression:  Unchanged Chronicity:  New Relieved by:  Nothing Worsened by:  Nothing Ineffective treatments:  None tried Associated symptoms: no dizziness, no fever, no headache, no nausea, no palpitations, no shortness of breath and no vomiting       Past Medical History:  Diagnosis Date   Alcohol abuse    in the past, resolved   Arthritis    Asthma    Blood transfusion without reported diagnosis 1984   after surgery   Breast cancer ([place])    Double mastectomy 2007; s/p tamoxifen and arimidex therapy; no recurrence since 05/2008   CAD (coronary artery disease)    Nuclear stress test Feb 11, 2011, EF 64%, no scar or ischemia    //         catheterization, November, 2011,patent LIMA-LAD-small caliber distal vessel with diffuse plaque, patent SVG to OM1 and OM 2, patent SVG to PDA and PLA, occluded SVG to diagonal, medical therapy;  [place] 6/14:  Inferior thinning, no ischemia, EF 61%, low risk   CHF (congestive heart failure) (HCC)    Chronic back pain    Diabetes mellitus    Dyslipidemia    Ejection fraction    EF 65%,  echo, [place], Jan 16, 2011   Hx of CABG    2007   Hx of colonic polyps    Hyperlipidemia    Hypertension    Myocardial infarction [facility])    Nausea & vomiting    hospitalization, November, 2011, stricture GE junction, questionable stenosis gastrojejunostomy, disruptive primary peristaltic wave, GE reflux, delayed emptying proximal gastric pouch   Normal cardiac stress test 02/2013   Overweight(278.02)    stomach stapling 1985 followed by weight loss, then return of weight   Sciatica    Sleep apnea    CPAP being arranged May, 2011//does not use CPAP   Tobacco abuse    in the past, resolved   UTI (urinary tract infection)     Patient Active Problem List   Diagnosis Date Noted   Dark stools 11/11/2020   Family history of stomach cancer 11/11/2020   Abdominal pain 11/11/2020   Asthma 03/07/2017   Sleep apnea 03/07/2017   Diabetes mellitus type 2, controlled ([place]) 03/07/2017   Hyperlipidemia 03/07/2017   CAD (coronary artery disease) with prior CABG 03/07/2017   UTI (urinary tract infection) 03/07/2017   Sepsis ([place]) 03/07/2017   Sepsis due to urinary tract infection ([place]) 03/07/2017   HTN (hypertension) 03/07/2017   Constipation 03/07/2017   Abnormal nuclear  stress test 12/20/2015   Angina, class III ([place]) 12/20/2015   Chest pain 11/12/2014   Upper GI bleed 03/04/2013   Atypical chest pain 01/07/2013   History of tobacco abuse 01/07/2013   SIRS (systemic inflammatory response syndrome) ([place]) 07/28/2012   Tachycardia 07/28/2012   GERD (gastroesophageal reflux disease) 02/24/2011   Coronary artery disease involving native coronary artery with angina pectoris (HCC)    Hx of CABG    Overweight    OSA (obstructive sleep apnea)    Nausea & vomiting    Breast cancer ([place])    Hypertension    Diabetes mellitus ([place])    Dyslipidemia    Ejection fraction     Past Surgical History:  Procedure Laterality Date   ABDOMINAL HYSTERECTOMY  1989   CARDIAC CATHETERIZATION N/A  12/20/2015   Procedure: Left Heart Cath and Cors/Grafts Angiography;  Surgeon: Leonie Man, MD;  Location: [facility];  Service: Cardiovascular;  Laterality: N/A;   CORONARY ARTERY BYPASS GRAFT  2009   CORONARY STENT PLACEMENT     GASTRIC BYPASS  1983   open.  [place]   MASTECTOMY Bilateral 1987     OB History   No obstetric history on file.     Family History  Problem Relation Age of Onset   Hypertension Mother    Heart disease Mother    Diabetes Mother    Heart disease Father    Colon cancer Father        does not know age of onset   Heart disease Sister    Colon cancer Sister 74   Stomach cancer Sister    Diabetes Sister    Asthma Sister    Diabetes Sister    Esophageal cancer Neg Hx    Rectal cancer Neg Hx     Social History   Tobacco Use   Smoking status: Former    Packs/day: 3.00    Years: 27.00    Pack years: 81.00    Types: Cigarettes    Quit date: 09/14/2000    Years since quitting: 20.6   Smokeless tobacco: Never  Vaping Use   Vaping Use: Never used  Substance Use Topics   Alcohol use: No   Drug use: No    Home Medications Prior to Admission medications   Medication Sig Start Date End Date Taking? Authorizing Provider  diclofenac Sodium (VOLTAREN) 1 % GEL Apply 4 g topically 4 (four) times daily. 05/01/21  Yes Deno Etienne, DO  albuterol (PROVENTIL HFA;VENTOLIN HFA) 108 (90 BASE) MCG/ACT inhaler Inhale 2 puffs into the lungs every 6 (six) hours as needed for shortness of breath.    [provider]  aspirin EC 81 MG tablet Take 81 mg by mouth daily.     [provider]  cetirizine (ZYRTEC) 10 MG tablet Take 1 tablet (10 mg total) by mouth daily. Patient taking differently: Take 10 mg by mouth daily as needed for allergies. 08/16/17   Maczis, Barth Kirks, PA-C  cholecalciferol (VITAMIN D3) 25 MCG (1000 UT) tablet Take 1,000 Units by mouth daily.    [provider]  fluticasone (FLONASE) 50 MCG/ACT nasal spray Place 2  sprays into both nostrils daily. Patient taking differently: Place 2 sprays into both nostrils daily as needed for allergies. 08/16/17   Maczis, Barth Kirks, PA-C  glipiZIDE (GLUCOTROL) 10 MG tablet Take 10 mg by mouth every morning. 12/06/20   [provider]  HYDROcodone-acetaminophen (NORCO) 10-325 MG tablet Take 1 tablet by mouth  2 (two) times daily as needed for moderate pain. 08/28/20   [provider]  HYDROcodone-acetaminophen (NORCO/VICODIN) 5-325 MG tablet Take 1 tablet by mouth every 6 (six) hours as needed for moderate pain or severe pain. Patient not taking: No sig reported 08/10/20   [name], MD  latanoprost (XALATAN) 0.005 % ophthalmic solution Place 1 drop into both eyes at bedtime as needed (dry eyes).    [provider]  lisinopril-hydrochlorothiazide (PRINZIDE,ZESTORETIC) 20-12.5 MG per tablet Take 1 tablet by mouth daily.    [provider]  magnesium oxide (MAG-OX) 400 MG tablet Take 400 mg by mouth daily as needed (leg cramps). 01/13/21   [provider]  metoprolol tartrate (LOPRESSOR) 25 MG tablet TAKE 1 TABLET BY MOUTH 2 TIMES A DAY. Patient taking differently: Take 25 mg by mouth 2 (two) times daily. 10/03/20   [name], MD  montelukast (SINGULAIR) 10 MG tablet Take 10 mg by mouth at bedtime. 08/14/19   [provider]  nitroGLYCERIN (NITROSTAT) 0.4 MG SL tablet Place 1 tablet (0.4 mg total) under the tongue every 5 (five) minutes as needed for chest pain. 01/15/17   [name], MD  phenazopyridine (PYRIDIUM) 200 MG tablet Take 1 tablet (200 mg total) by mouth 3 (three) times daily as needed for pain. 08/10/20   [name], MD  rosuvastatin (CRESTOR) 10 MG tablet Take 1 tablet (10 mg total) by mouth at bedtime. 08/26/18   [name], MD    Allergies    Latex  Review of Systems   Review of Systems  Constitutional:  Negative for chills and fever.  HENT:  Negative for congestion and rhinorrhea.    Eyes:  Negative for redness and visual disturbance.  Respiratory:  Negative for shortness of breath and wheezing.   Cardiovascular:  Positive for chest pain. Negative for palpitations.  Gastrointestinal:  Negative for nausea and vomiting.  Genitourinary:  Negative for dysuria and urgency.  Musculoskeletal:  Negative for arthralgias and myalgias.  Skin:  Negative for pallor and wound.  Neurological:  Negative for dizziness and headaches.   Physical Exam Updated Vital Signs BP 131/72 (BP Location: Right Arm)   Pulse 72   Temp 98.1 F (36.7 C) (Oral)   Resp 18   SpO2 98%   Physical Exam Vitals and nursing note reviewed.  Constitutional:      General: She is not in acute distress.    Appearance: She is well-developed. She is not diaphoretic.  HENT:     Head: Normocephalic and atraumatic.  Eyes:     Pupils: Pupils are equal, round, and reactive to light.  Cardiovascular:     Rate and Rhythm: Normal rate and regular rhythm.     Heart sounds: No murmur heard.   No friction rub. No gallop.  Pulmonary:     Effort: Pulmonary effort is normal.     Breath sounds: No wheezing or rales.  Chest:     Chest wall: Tenderness (tender to the sternum, reproduces symptoms) present.  Abdominal:     General: There is no distension.     Palpations: Abdomen is soft.     Tenderness: There is no abdominal tenderness.  Musculoskeletal:        General: No tenderness.     Cervical back: Normal range of motion and neck supple.  Skin:    General: Skin is warm and dry.  Neurological:     Mental Status: She is alert and oriented to person, place, and  time.  Psychiatric:        Behavior: Behavior normal.    ED Results / Procedures / Treatments   Labs (all labs ordered are listed, but only abnormal results are displayed) Labs Reviewed  COMPREHENSIVE METABOLIC PANEL - Abnormal; Notable for the following components:      Result Value   Glucose, Bld 149 (*)    Albumin 3.4 (*)    All other  components within normal limits  CBC WITH DIFFERENTIAL/PLATELET - Abnormal; Notable for the following components:   WBC 12.7 (*)    Neutro Abs 8.1 (*)    Monocytes Absolute 1.1 (*)    All other components within normal limits  TROPONIN I (HIGH SENSITIVITY)  TROPONIN I (HIGH SENSITIVITY)    EKG EKG Interpretation  Date/Time:  Thursday May 01 2021 17:15:30 EDT Ventricular Rate:  55 PR Interval:  136 QRS Duration: 74 QT Interval:  410 QTC Calculation: 392 R Axis:   0 Text Interpretation: Sinus bradycardia with marked sinus arrhythmia Minimal voltage criteria for LVH, may be normal variant ( R in aVL ) Nonspecific T wave abnormality Abnormal ECG No significant change since last tracing Confirmed by Deno Etienne [phone]) on 05/01/2021 11:04:28 PM  Radiology DG Chest 2 View  Result Date: 05/01/2021 CLINICAL DATA:  Chest pain and right arm tingling, initial encounter EXAM: CHEST - 2 VIEW COMPARISON:  04/28/2021 FINDINGS: Cardiac shadow is stable. Postsurgical changes are again seen. Elevation the right hemidiaphragm is again noted and stable. No focal infiltrate or sizable effusion is seen. No bony abnormality is noted aside from degenerative change of the thoracic spine. Postsurgical changes in the right axilla are again noted. IMPRESSION: No acute abnormality noted. Electronically Signed   By: Inez Catalina M.D.   On: 05/01/2021 17:56    Procedures Procedures   Medications Ordered in ED Medications  aspirin chewable tablet 324 mg (324 mg Oral Given 05/01/21 2315)    ED Course  I have reviewed the triage vital signs and the nursing notes.  Pertinent labs & imaging results that were available during my care of the patient were reviewed by me and considered in my medical decision making (see chart for details).    MDM Rules/Calculators/A&P                           70 yo F with a cc of chest pain.  Atypical in nature reproduces on exam.  Two trops negative.  Completely atypical of  PE.    12:10 AM:  I have discussed the diagnosis/risks/treatment options with the patient and believe the pt to be eligible for discharge home to follow-up with PCP. We also discussed returning to the ED immediately if new or worsening sx occur. We discussed the sx which are most concerning (e.g., sudden worsening pain, fever, inability to tolerate by mouth) that necessitate immediate return. Medications administered to the patient during their visit and any new prescriptions provided to the patient are listed below.  Medications given during this visit Medications  aspirin chewable tablet 324 mg (324 mg Oral Given 05/01/21 2315)     The patient appears reasonably screen and/or stabilized for discharge and I doubt any other medical condition or other [facility] requiring further screening, evaluation, or treatment in the ED at this time prior to discharge.   Final Clinical Impression(s) / ED Diagnoses Final diagnoses:  Nonspecific chest pain    Rx / DC Orders ED Discharge Orders  Ordered    diclofenac Sodium (VOLTAREN) 1 % GEL  4 times daily        05/01/21 2348             Deno Etienne, DO 05/02/21 0010

## 2021-06-01 ENCOUNTER — Encounter (HOSPITAL_BASED_OUTPATIENT_CLINIC_OR_DEPARTMENT_OTHER): Payer: Self-pay

## 2021-06-01 ENCOUNTER — Emergency Department (HOSPITAL_BASED_OUTPATIENT_CLINIC_OR_DEPARTMENT_OTHER)
Admission: EM | Admit: 2021-06-01 | Discharge: 2021-06-01 | Disposition: A | Discharge status: Home / Self Care | Payer: Medicare Other | Attending: Emergency Medicine | Admitting: Emergency Medicine

## 2021-06-01 ENCOUNTER — Ambulatory Visit (HOSPITAL_BASED_OUTPATIENT_CLINIC_OR_DEPARTMENT_OTHER)
Admission: RE | Admit: 2021-06-01 | Discharge: 2021-06-01 | Disposition: A | Discharge status: Home / Self Care | Payer: Medicare Other | Source: Ambulatory Visit | Attending: Emergency Medicine | Admitting: Emergency Medicine

## 2021-06-01 ENCOUNTER — Other Ambulatory Visit: Payer: Self-pay

## 2021-06-01 ENCOUNTER — Emergency Department (HOSPITAL_BASED_OUTPATIENT_CLINIC_OR_DEPARTMENT_OTHER): Payer: Medicare Other

## 2021-06-01 DIAGNOSIS — I2510 Atherosclerotic heart disease of native coronary artery without angina pectoris: Secondary | ICD-10-CM | POA: Diagnosis not present

## 2021-06-01 DIAGNOSIS — M25561 Pain in right knee: Secondary | ICD-10-CM | POA: Diagnosis not present

## 2021-06-01 DIAGNOSIS — I110 Hypertensive heart disease with heart failure: Secondary | ICD-10-CM | POA: Insufficient documentation

## 2021-06-01 DIAGNOSIS — E119 Type 2 diabetes mellitus without complications: Secondary | ICD-10-CM | POA: Diagnosis not present

## 2021-06-01 DIAGNOSIS — M79651 Pain in right thigh: Secondary | ICD-10-CM | POA: Diagnosis not present

## 2021-06-01 DIAGNOSIS — M79652 Pain in left thigh: Secondary | ICD-10-CM | POA: Diagnosis not present

## 2021-06-01 DIAGNOSIS — M79661 Pain in right lower leg: Secondary | ICD-10-CM | POA: Insufficient documentation

## 2021-06-01 DIAGNOSIS — Z951 Presence of aortocoronary bypass graft: Secondary | ICD-10-CM | POA: Diagnosis not present

## 2021-06-01 DIAGNOSIS — I509 Heart failure, unspecified: Secondary | ICD-10-CM | POA: Diagnosis not present

## 2021-06-01 DIAGNOSIS — M79604 Pain in right leg: Secondary | ICD-10-CM | POA: Insufficient documentation

## 2021-06-01 DIAGNOSIS — M25562 Pain in left knee: Secondary | ICD-10-CM | POA: Insufficient documentation

## 2021-06-01 DIAGNOSIS — Z7982 Long term (current) use of aspirin: Secondary | ICD-10-CM | POA: Insufficient documentation

## 2021-06-01 DIAGNOSIS — M79662 Pain in left lower leg: Secondary | ICD-10-CM | POA: Insufficient documentation

## 2021-06-01 DIAGNOSIS — J45909 Unspecified asthma, uncomplicated: Secondary | ICD-10-CM | POA: Diagnosis not present

## 2021-06-01 DIAGNOSIS — M79605 Pain in left leg: Secondary | ICD-10-CM | POA: Insufficient documentation

## 2021-06-01 DIAGNOSIS — Z853 Personal history of malignant neoplasm of breast: Secondary | ICD-10-CM | POA: Diagnosis not present

## 2021-06-01 DIAGNOSIS — Z79899 Other long term (current) drug therapy: Secondary | ICD-10-CM | POA: Insufficient documentation

## 2021-06-01 DIAGNOSIS — Z7984 Long term (current) use of oral hypoglycemic drugs: Secondary | ICD-10-CM | POA: Diagnosis not present

## 2021-06-01 DIAGNOSIS — Z87891 Personal history of nicotine dependence: Secondary | ICD-10-CM | POA: Insufficient documentation

## 2021-06-01 LAB — CBC WITH DIFFERENTIAL/PLATELET
Abs Immature Granulocytes: 0.01 10*3/uL (ref 0.00–0.07)
Basophils Absolute: 0 10*3/uL (ref 0.0–0.1)
Basophils Relative: 1 %
Eosinophils Absolute: 0.1 10*3/uL (ref 0.0–0.5)
Eosinophils Relative: 1 %
HCT: 40.7 % (ref 36.0–46.0)
Hemoglobin: 13.1 g/dL (ref 12.0–15.0)
Immature Granulocytes: 0 %
Lymphocytes Relative: 58 %
Lymphs Abs: 3.3 10*3/uL (ref 0.7–4.0)
MCH: 28.5 pg (ref 26.0–34.0)
MCHC: 32.2 g/dL (ref 30.0–36.0)
MCV: 88.5 fL (ref 80.0–100.0)
Monocytes Absolute: 0.6 10*3/uL (ref 0.1–1.0)
Monocytes Relative: 11 %
Neutro Abs: 1.6 10*3/uL — ABNORMAL LOW (ref 1.7–7.7)
Neutrophils Relative %: 29 %
Platelets: 165 10*3/uL (ref 150–400)
RBC: 4.6 MIL/uL (ref 3.87–5.11)
RDW: 15 % (ref 11.5–15.5)
WBC: 5.6 10*3/uL (ref 4.0–10.5)
nRBC: 0 % (ref 0.0–0.2)

## 2021-06-01 LAB — BASIC METABOLIC PANEL
Anion gap: 9 (ref 5–15)
BUN: 10 mg/dL (ref 8–23)
CO2: 25 mmol/L (ref 22–32)
Calcium: 9.2 mg/dL (ref 8.9–10.3)
Chloride: 105 mmol/L (ref 98–111)
Creatinine, Ser: 0.74 mg/dL (ref 0.44–1.00)
GFR, Estimated: >60 mL/min (ref >60)
Glucose, Bld: 114 mg/dL — ABNORMAL HIGH (ref 70–99)
Potassium: 3.9 mmol/L (ref 3.5–5.1)
Sodium: 139 mmol/L (ref 135–145)

## 2021-06-01 LAB — CK: Total CK: 342 U/L — ABNORMAL HIGH (ref 38–234)

## 2021-06-01 MED ORDER — TRAMADOL HCL 50 MG PO TABS
50.0000 mg | ORAL_TABLET | Freq: Once | ORAL | Status: AC
  Administered 2021-06-01: 50 mg via ORAL
  Filled 2021-06-01: qty 1

## 2021-06-01 NOTE — ED Provider Notes (Addendum)
[facility] EMERGENCY DEPARTMENT Provider Note   CSN: [id number] Arrival date & time: 06/01/21  0018     History Chief Complaint  Patient presents with   Leg Pain    Patricia Cuevas is a 70 y.o. female.  Patient is a 70 year old female with past medical history of coronary artery disease status post CABG, breast cancer, diabetes, hyperlipidemia, hypertension.  Patient presenting today for evaluation of leg pain.  [place].  While she was ambulating, her legs began to hurt.  She describes pain in the soft tissues of the thighs, calves, and in the knee joints.  Pain is worse with ambulation, palpation, and movement.  Pain alleviated with rest.  The history is provided by the patient.  Leg Pain Pain details:    Quality:  Aching   Radiates to:  Does not radiate   Severity:  Severe   Onset quality:  Sudden   Timing:  Constant   Progression:  Worsening Chronicity:  New     Past Medical History:  Diagnosis Date   Alcohol abuse    in the past, resolved   Arthritis    Asthma    Blood transfusion without reported diagnosis 1984   after surgery   Breast cancer ([place])    Double mastectomy 2007; s/p tamoxifen and arimidex therapy; no recurrence since 05/2008   CAD (coronary artery disease)    Nuclear stress test Feb 11, 2011, EF 64%, no scar or ischemia    //         catheterization, November, 2011,patent LIMA-LAD-small caliber distal vessel with diffuse plaque, patent SVG to OM1 and OM 2, patent SVG to PDA and PLA, occluded SVG to diagonal, medical therapy;  [place] 6/14:  Inferior thinning, no ischemia, EF 61%, low risk   CHF (congestive heart failure) (HCC)    Chronic back pain    Diabetes mellitus    Dyslipidemia    Ejection fraction    EF 65%, echo, [place], Jan 16, 2011   Hx of CABG    2007   Hx of colonic polyps    Hyperlipidemia    Hypertension    Myocardial infarction [facility])    Nausea &  vomiting    hospitalization, November, 2011, stricture GE junction, questionable stenosis gastrojejunostomy, disruptive primary peristaltic wave, GE reflux, delayed emptying proximal gastric pouch   Normal cardiac stress test 02/2013   Overweight(278.02)    stomach stapling 1985 followed by weight loss, then return of weight   Sciatica    Sleep apnea    CPAP being arranged May, 2011//does not use CPAP   Tobacco abuse    in the past, resolved   UTI (urinary tract infection)     Patient Active Problem List   Diagnosis Date Noted   Dark stools 11/11/2020   Family history of stomach cancer 11/11/2020   Abdominal pain 11/11/2020   Asthma 03/07/2017   Sleep apnea 03/07/2017   Diabetes mellitus type 2, controlled ([place]) 03/07/2017   Hyperlipidemia 03/07/2017   CAD (coronary artery disease) with prior CABG 03/07/2017   UTI (urinary tract infection) 03/07/2017   Sepsis ([place]) 03/07/2017   Sepsis due to urinary tract infection ([place]) 03/07/2017   HTN (hypertension) 03/07/2017   Constipation 03/07/2017   Abnormal nuclear stress test 12/20/2015   Angina, class III ([place]) 12/20/2015   Chest pain 11/12/2014   Upper GI bleed 03/04/2013   Atypical chest pain 01/07/2013   History  of tobacco abuse 01/07/2013   SIRS (systemic inflammatory response syndrome) ([place]) 07/28/2012   Tachycardia 07/28/2012   GERD (gastroesophageal reflux disease) 02/24/2011   Coronary artery disease involving native coronary artery with angina pectoris (HCC)    Hx of CABG    Overweight    OSA (obstructive sleep apnea)    Nausea & vomiting    Breast cancer ([place])    Hypertension    Diabetes mellitus ([place])    Dyslipidemia    Ejection fraction     Past Surgical History:  Procedure Laterality Date   ABDOMINAL HYSTERECTOMY  1989   CARDIAC CATHETERIZATION N/A 12/20/2015   Procedure: Left Heart Cath and Cors/Grafts Angiography;  Surgeon: Leonie Man, MD;  Location: [facility];  Service: Cardiovascular;   Laterality: N/A;   CORONARY ARTERY BYPASS GRAFT  2009   CORONARY STENT PLACEMENT     GASTRIC BYPASS  1983   open.  [place]   MASTECTOMY Bilateral 1987     OB History   No obstetric history on file.     Family History  Problem Relation Age of Onset   Hypertension Mother    Heart disease Mother    Diabetes Mother    Heart disease Father    Colon cancer Father        does not know age of onset   Heart disease Sister    Colon cancer Sister 68   Stomach cancer Sister    Diabetes Sister    Asthma Sister    Diabetes Sister    Esophageal cancer Neg Hx    Rectal cancer Neg Hx     Social History   Tobacco Use   Smoking status: Former    Packs/day: 3.00    Years: 27.00    Pack years: 81.00    Types: Cigarettes    Quit date: 09/14/2000    Years since quitting: 20.7   Smokeless tobacco: Never  Vaping Use   Vaping Use: Never used  Substance Use Topics   Alcohol use: No   Drug use: No    Home Medications Prior to Admission medications   Medication Sig Start Date End Date Taking? Authorizing Provider  albuterol (PROVENTIL HFA;VENTOLIN HFA) 108 (90 BASE) MCG/ACT inhaler Inhale 2 puffs into the lungs every 6 (six) hours as needed for shortness of breath.    [provider]  aspirin EC 81 MG tablet Take 81 mg by mouth daily.     [provider]  cetirizine (ZYRTEC) 10 MG tablet Take 1 tablet (10 mg total) by mouth daily. Patient taking differently: Take 10 mg by mouth daily as needed for allergies. 08/16/17   Maczis, Barth Kirks, PA-C  cholecalciferol (VITAMIN D3) 25 MCG (1000 UT) tablet Take 1,000 Units by mouth daily.    [provider]  diclofenac Sodium (VOLTAREN) 1 % GEL Apply 4 g topically 4 (four) times daily. 05/01/21   Deno Etienne, DO  fluticasone (FLONASE) 50 MCG/ACT nasal spray Place 2 sprays into both nostrils daily. Patient taking differently: Place 2 sprays into both nostrils daily as needed for allergies. 08/16/17   Maczis, Barth Kirks, PA-C   glipiZIDE (GLUCOTROL) 10 MG tablet Take 10 mg by mouth every morning. 12/06/20   [provider]  HYDROcodone-acetaminophen (NORCO) 10-325 MG tablet Take 1 tablet by mouth 2 (two) times daily as needed for moderate pain. 08/28/20   [provider]  HYDROcodone-acetaminophen (NORCO/VICODIN) 5-325 MG tablet Take 1 tablet by mouth every 6 (six) hours  as needed for moderate pain or severe pain. Patient not taking: No sig reported 08/10/20   [name], MD  latanoprost (XALATAN) 0.005 % ophthalmic solution Place 1 drop into both eyes at bedtime as needed (dry eyes).    [provider]  lisinopril-hydrochlorothiazide (PRINZIDE,ZESTORETIC) 20-12.5 MG per tablet Take 1 tablet by mouth daily.    [provider]  magnesium oxide (MAG-OX) 400 MG tablet Take 400 mg by mouth daily as needed (leg cramps). 01/13/21   [provider]  metoprolol tartrate (LOPRESSOR) 25 MG tablet TAKE 1 TABLET BY MOUTH 2 TIMES A DAY. Patient taking differently: Take 25 mg by mouth 2 (two) times daily. 10/03/20   [name], MD  montelukast (SINGULAIR) 10 MG tablet Take 10 mg by mouth at bedtime. 08/14/19   [provider]  nitroGLYCERIN (NITROSTAT) 0.4 MG SL tablet Place 1 tablet (0.4 mg total) under the tongue every 5 (five) minutes as needed for chest pain. 01/15/17   [name], MD  phenazopyridine (PYRIDIUM) 200 MG tablet Take 1 tablet (200 mg total) by mouth 3 (three) times daily as needed for pain. 08/10/20   [name], MD  rosuvastatin (CRESTOR) 10 MG tablet Take 1 tablet (10 mg total) by mouth at bedtime. 08/26/18   [name], MD    Allergies    Latex  Review of Systems   Review of Systems  All other systems reviewed and are negative.  Physical Exam Updated Vital Signs BP (!) 113/44   Pulse 62   Temp 98.3 F (36.8 C) (Oral)   Resp 19   Ht '4\' 10"'$  (1.473 m)   Wt 95.3 kg   SpO2 97%   BMI 43.89 kg/m   Physical Exam Vitals and nursing  note reviewed.  Constitutional:      General: She is not in acute distress.    Appearance: She is well-developed. She is not diaphoretic.  HENT:     Head: Normocephalic and atraumatic.  Cardiovascular:     Rate and Rhythm: Normal rate and regular rhythm.     Heart sounds: No murmur heard.   No friction rub. No gallop.  Pulmonary:     Effort: Pulmonary effort is normal. No respiratory distress.     Breath sounds: Normal breath sounds. No wheezing.  Abdominal:     General: Bowel sounds are normal. There is no distension.     Palpations: Abdomen is soft.     Tenderness: There is no abdominal tenderness.  Musculoskeletal:        General: Normal range of motion.     Cervical back: Normal range of motion and neck supple.     Comments: Bilateral extremities are grossly normal in appearance and symmetrical.  She has tenderness to palpation in the thighs and calves.  She also has pain with range of motion of the knees.  There is no crepitus.  DP pulses are easily palpable and motor and sensation are intact throughout both legs and feet.  Skin:    General: Skin is warm and dry.  Neurological:     General: No focal deficit present.     Mental Status: She is alert and oriented to person, place, and time.    ED Results / Procedures / Treatments   Labs (all labs ordered are listed, but only abnormal results are displayed) Labs Reviewed - No data to display  EKG None  Radiology No results found.  Procedures Procedures   Medications Ordered in ED Medications -  No data to display  ED Course  I have reviewed the triage vital signs and the nursing notes.  Pertinent labs & imaging results that were available during my care of the patient were reviewed by me and considered in my medical decision making (see chart for details).    MDM Rules/Calculators/A&P  Patient presenting with complaints of bilateral leg pain that started [place] she [place].  Patient is tender throughout the thighs and calves, but there is no edema, redness, or obvious abnormality.  She has good pulses bilaterally in both feet.  CK is only slightly elevated and I do not believe this is rhabdomyolysis.  I highly doubt bilateral DVT, but will have patient return tomorrow for an ultrasound.  She seems appropriate for discharge tonight.  She tells me she has pain medication at home and will take this as needed.  I suspect some sort of overuse causing her discomfort.  This also does not sound like claudication as patient has bounding pulses in both feet.  Final Clinical Impression(s) / ED Diagnoses Final diagnoses:  None    Rx / DC Orders ED Discharge Orders     None        Veryl Speak, MD 06/01/21 [id number]    Veryl Speak, MD 06/01/21 0300

## 2021-06-01 NOTE — ED Notes (Signed)
Patient left ED with ABCs intact, alert and oriented x4, respirations even and unlabored. Discharge instructions reviewed and all questions answered.  [place] appointment instructions given to patient and written on discharge paperwork.

## 2021-06-01 NOTE — ED Triage Notes (Signed)
Pt reports nontraumatic bilateral lower leg pain that began tonight while walking the grandchildren.

## 2021-06-01 NOTE — ED Provider Notes (Signed)
Patient returns today for her Doppler which was negative for DVT. She is still having some discomfort after walking for a long time yesterday. Advised rest, pain meds at home and PCP follow up.    Truddie Hidden, MD 06/01/21 [phone]

## 2021-06-01 NOTE — Discharge Instructions (Addendum)
Rest.  Continue taking your home medications as needed for pain.  Return tomorrow at the given time for an ultrasound of your legs to rule out blood clots.

## 2021-06-02 ENCOUNTER — Ambulatory Visit (HOSPITAL_BASED_OUTPATIENT_CLINIC_OR_DEPARTMENT_OTHER): Payer: Medicare Other | Admitting: Family

## 2021-06-30 ENCOUNTER — Ambulatory Visit (HOSPITAL_BASED_OUTPATIENT_CLINIC_OR_DEPARTMENT_OTHER): Payer: Medicare Other | Admitting: Family

## 2021-07-10 ENCOUNTER — Ambulatory Visit (HOSPITAL_BASED_OUTPATIENT_CLINIC_OR_DEPARTMENT_OTHER): Payer: Medicare Other | Admitting: Family

## 2021-07-22 ENCOUNTER — Other Ambulatory Visit: Payer: Self-pay

## 2021-07-22 ENCOUNTER — Ambulatory Visit (INDEPENDENT_AMBULATORY_CARE_PROVIDER_SITE_OTHER): Payer: Medicare Other | Admitting: Family

## 2021-07-22 ENCOUNTER — Encounter (HOSPITAL_BASED_OUTPATIENT_CLINIC_OR_DEPARTMENT_OTHER): Payer: Self-pay | Admitting: Family

## 2021-07-22 VITALS — BP 124/76 | HR 76 | Ht <= 58 in | Wt 205.9 lb

## 2021-07-22 DIAGNOSIS — R0789 Other chest pain: Secondary | ICD-10-CM | POA: Diagnosis not present

## 2021-07-22 DIAGNOSIS — I25118 Atherosclerotic heart disease of native coronary artery with other forms of angina pectoris: Secondary | ICD-10-CM

## 2021-07-22 DIAGNOSIS — E782 Mixed hyperlipidemia: Secondary | ICD-10-CM | POA: Diagnosis not present

## 2021-07-22 MED ORDER — NITROGLYCERIN 0.4 MG SL SUBL: 0.4000 mg | SUBLINGUAL_TABLET | SUBLINGUAL | 3 refills | Status: AC | PRN

## 2021-07-22 NOTE — Progress Notes (Signed)
Office Visit    Patient Name: Patricia Cuevas Date of Encounter: 07/22/2021  PCP:  [name], [place]  Cardiologist:  Candee Furbish, MD  Advanced Practice Provider:  No care team member to display Electrophysiologist:  None      Chief Complaint    Patricia Cuevas is a 70 y.o. female with a hx of CAD (CABG 2007, cath 2011, cath 2017 with patent LIMA-LAD, SVG-PDA and PLA,  SVG-OM and OM2, occluded SVG-diagonal), asthma, breast cancer DM2, HLD, obesity, DVT presents today for follow up of coronary disease.  Past Medical History    Past Medical History:  Diagnosis Date   Alcohol abuse    in the past, resolved   Arthritis    Asthma    Blood transfusion without reported diagnosis 1984   after surgery   Breast cancer ([place])    Double mastectomy 2007; s/p tamoxifen and arimidex therapy; no recurrence since 05/2008   CAD (coronary artery disease)    Nuclear stress test Feb 11, 2011, EF 64%, no scar or ischemia    //         catheterization, November, 2011,patent LIMA-LAD-small caliber distal vessel with diffuse plaque, patent SVG to OM1 and OM 2, patent SVG to PDA and PLA, occluded SVG to diagonal, medical therapy;  [place] 6/14:  Inferior thinning, no ischemia, EF 61%, low risk   CHF (congestive heart failure) (HCC)    Chronic back pain    Diabetes mellitus    Dyslipidemia    Ejection fraction    EF 65%, echo, [place], Jan 16, 2011   Hx of CABG    2007   Hx of colonic polyps    Hyperlipidemia    Hypertension    Myocardial infarction [facility])    Nausea & vomiting    hospitalization, November, 2011, stricture GE junction, questionable stenosis gastrojejunostomy, disruptive primary peristaltic wave, GE reflux, delayed emptying proximal gastric pouch   Normal cardiac stress test 02/2013   Overweight(278.02)    stomach stapling 1985 followed by weight loss, then return of weight   Sciatica    Sleep apnea    CPAP being arranged May,  2011//does not use CPAP   Tobacco abuse    in the past, resolved   UTI (urinary tract infection)    Past Surgical History:  Procedure Laterality Date   [place] N/A 12/20/2015   Procedure: Left Heart Cath and Cors/Grafts Angiography;  Surgeon: Leonie Man, MD;  Location: [facility];  Service: Cardiovascular;  Laterality: N/A;   CORONARY ARTERY BYPASS GRAFT  2009   CORONARY STENT PLACEMENT     GASTRIC BYPASS  1983   open.  [place]   MASTECTOMY Bilateral 1987    Allergies  Allergies  Allergen Reactions   Latex Itching and Rash    History of Present Illness    Patricia Cuevas is a 70 y.o. female with a hx of CAD (CABG 2007, cath 2011, cath 2017 with patent LIMA-LAD, SVG-PDA and PLA,  SVG-OM and OM2, occluded SVG-diagonal), asthma, breast cancer DM2, HLD, obesity, DVT last seen 08/26/20 by Dr. Marlou Porch.  She has frequent ED visits for chest pain. ED visit 05/01/21 due to chest pain for a few weeks. It was midsternal and radiated to right chest. It was tender on palpation and troponin negative x 2.   She presents today for follow up. Tells me her daughter  passed away about a year ago and she is now raising her 3 grandchildren with her husband. Her 54 year old grandson is autistic and her other two are 19 and 71 years old. She stays very active with them. No recurrent chest pain since ED visit in August. Reports no shortness of breath nor dyspnea on exertion. Reports no chest pain, pressure, or tightness. No edema, orthopnea, PND. Reports no palpitations.    EKGs/Labs/Other Studies Reviewed:   The following studies were reviewed today:  LHC 12/2015 Prox RCA to Mid RCA lesion, 55% stenosed. Mid RCA to Dist RCA lesion, 100% stenosed. Mild disease of the Ostial-Prox SVG-dRCA(filling PLA & PDA). Mid LAD lesion, 100% stenosed. LIMA-LAD is patent, but very small to a small distal LAD. Very tortuous graft - not a good option to attempt  PCI through. Prox Cx to Mid Cx lesion, 100% stenosed. The lesion was previously treated with a stent (unknown type) greater than two years ago. Mid Cx lesion, 100% stenosed. Patent SVG-OM2 & OM3. Origin to Prox SVG-Diag Graft lesion, 100% stenosed. Known. Sequential SVG-OM2-OM3 was injected is large & widely patent. The left ventricular systolic function is normal.     No real change in anatomy from 2011 catheterization. No culprit lesion to explain the patient's stress test.   Normal EF with normal EDP and stable coronary arteries. She should be at low risk from a cardiovascular standpoint for her surgery from a revascularization standpoint.   [place] 11/2015 Nuclear stress EF: 63%. There was no ST segment deviation noted during stress. Defect 1: There is a medium defect of mild severity present in the basal inferolateral, mid inferior and mid inferolateral location. Findings consistent with ischemia. This is an intermediate risk study. The left ventricular ejection fraction is normal (55-65%).   There is medium size area mild severity ischemia in the basal and mid inferolateral and mid inferior walls (SDS 3).  EKG:  No EKG ordered today. EKG independently reviewed from 05/02/21 demonstrated NSR with sinus arrhythmia and TWI in lead III, V3-V5.  Recent Labs: 11/05/2020: TSH 1.05 05/01/2021: ALT 19 06/01/2021: BUN 10; Creatinine, Ser 0.74; Hemoglobin 13.1; Platelets 165; Potassium 3.9; Sodium 139  Recent Lipid Panel    Component Value Date/Time   CHOL 158 11/15/2018 1550   TRIG 138 11/15/2018 1550   HDL 46 11/15/2018 1550   CHOLHDL 3.4 11/15/2018 1550   CHOLHDL 3.4 05/17/2009 0130   VLDL 19 05/17/2009 0130   LDLCALC 84 11/15/2018 1550     Home Medications   Current Meds  Medication Sig   albuterol (PROVENTIL HFA;VENTOLIN HFA) 108 (90 BASE) MCG/ACT inhaler Inhale 2 puffs into the lungs every 6 (six) hours as needed for shortness of breath.   aspirin EC 81 MG tablet Take 81 mg  by mouth daily.    cetirizine (ZYRTEC) 10 MG tablet Take 1 tablet (10 mg total) by mouth daily. (Patient taking differently: Take 10 mg by mouth daily as needed for allergies.)   cholecalciferol (VITAMIN D3) 25 MCG (1000 UT) tablet Take 1,000 Units by mouth daily.   diclofenac Sodium (VOLTAREN) 1 % GEL Apply 4 g topically 4 (four) times daily.   fluticasone (FLONASE) 50 MCG/ACT nasal spray Place 2 sprays into both nostrils daily. (Patient taking differently: Place 2 sprays into both nostrils daily as needed for allergies.)   glipiZIDE (GLUCOTROL) 10 MG tablet Take 10 mg by mouth every morning.   HYDROcodone-acetaminophen (NORCO) 10-325 MG tablet Take 1 tablet by mouth 2 (two) times daily  as needed for moderate pain.   HYDROcodone-acetaminophen (NORCO/VICODIN) 5-325 MG tablet Take 1 tablet by mouth every 6 (six) hours as needed for moderate pain or severe pain.   latanoprost (XALATAN) 0.005 % ophthalmic solution Place 1 drop into both eyes at bedtime as needed (dry eyes).   lisinopril-hydrochlorothiazide (PRINZIDE,ZESTORETIC) 20-12.5 MG per tablet Take 1 tablet by mouth daily.   magnesium oxide (MAG-OX) 400 MG tablet Take 400 mg by mouth daily as needed (leg cramps).   metoprolol tartrate (LOPRESSOR) 25 MG tablet TAKE 1 TABLET BY MOUTH 2 TIMES A DAY. (Patient taking differently: Take 25 mg by mouth 2 (two) times daily.)   montelukast (SINGULAIR) 10 MG tablet Take 10 mg by mouth at bedtime.   nitroGLYCERIN (NITROSTAT) 0.4 MG SL tablet Place 1 tablet (0.4 mg total) under the tongue every 5 (five) minutes as needed for chest pain.   phenazopyridine (PYRIDIUM) 200 MG tablet Take 1 tablet (200 mg total) by mouth 3 (three) times daily as needed for pain.   rosuvastatin (CRESTOR) 10 MG tablet Take 1 tablet (10 mg total) by mouth at bedtime.     Review of Systems      All other systems reviewed and are otherwise negative except as noted above.  Physical Exam    VS:  BP 124/76   Pulse 76   Ht 4\' 10"   (1.473 m)   Wt 205 lb 14.4 oz (93.4 kg)   BMI 43.03 kg/m  , BMI Body mass index is 43.03 kg/m.  Wt Readings from Last 3 Encounters:  07/22/21 205 lb 14.4 oz (93.4 kg)  06/01/21 210 lb (95.3 kg)  02/21/21 205 lb (93 kg)     GEN: Well nourished, well developed, in no acute distress. HEENT: normal. Neck: Supple, no JVD, carotid bruits, or masses. Cardiac: RRR, no murmurs, rubs, or gallops. No clubbing, cyanosis, edema.  Radials/PT 2+ and equal bilaterally.  Respiratory:  Respirations regular and unlabored, clear to auscultation bilaterally. GI: Soft, nontender, nondistended. MS: No deformity or atrophy. Skin: Warm and dry, no rash. Neuro:  Strength and sensation are intact. Psych: Normal affect.  Assessment & Plan    CAD s/p CABG - 2017 cardiac cath detailed above with recommendation for medical management. GDMT includes Aspirin, Metoprolol, Rosuvastatin, PRN Nitroglycerin. Refill of nitroglycerin provided as hers was misplaced. Heart healthy diet and regular cardiovascular exercise encouraged.    HTN - BP well controlled. Continue current antihypertensive regimen.    HLD, LDL goal <70 - Continue Crestor 10mg  QD. Reports having lipid recently checked by primary care, we will request records.   Morbid obesity - Weight loss via diet and exercise encouraged. Discussed the impact being overweight would have on cardiovascular risk, CAD, HLD.  Disposition: Follow up in 1 year(s) with Dr. Marlou Porch or APP.  Signed, Loel Dubonnet, NP 07/22/2021, 2:11 PM [facility]

## 2021-07-22 NOTE — Patient Instructions (Signed)
Medication Instructions:  Refill called in *If you need a refill on your cardiac medications before your next appointment, please call your pharmacy*   Lab Work: None today  If you have labs (blood work) drawn today and your tests are completely normal, you will receive your results only by: [place] (if you have MyChart) OR A paper copy in the mail If you have any lab test that is abnormal or we need to change your treatment, we will call you to review the results.   Testing/Procedures: None today    Follow-Up: At [facility], you and your health needs are our priority.  As part of our continuing mission to provide you with exceptional heart care, we have created designated Provider Care Teams.  These Care Teams include your primary Cardiologist (physician) and Advanced Practice Providers (APPs -  Physician Assistants and Nurse Practitioners) who all work together to provide you with the care you need, when you need it.  We recommend signing up for the patient portal called "MyChart".  Sign up information is provided on this After Visit Summary.  MyChart is used to connect with patients for Virtual Visits (Telemedicine).  Patients are able to view lab/test results, encounter notes, upcoming appointments, etc.  Non-urgent messages can be sent to your provider as well.   To learn more about what you can do with MyChart, go to [email or web address].    Your next appointment:   1 year   The format for your next appointment:   In Person  Provider:   Candee Furbish, MD {    Other Instructions Heart Healthy Diet Recommendations: A low-salt diet is recommended. Meats should be grilled, baked, or boiled. Avoid fried foods. Focus on lean protein sources like fish or chicken with vegetables and fruits. The American Heart Association is a Microbiologist!  American Heart Association Diet and Lifeystyle Recommendations    Exercise recommendations: The American Heart Association  recommends 150 minutes of moderate intensity exercise weekly. Try 30 minutes of moderate intensity exercise 4-5 times per week. This could include walking, jogging, or swimming.

## 2021-07-30 ENCOUNTER — Emergency Department (HOSPITAL_BASED_OUTPATIENT_CLINIC_OR_DEPARTMENT_OTHER): Payer: Medicare Other

## 2021-07-30 ENCOUNTER — Encounter (HOSPITAL_BASED_OUTPATIENT_CLINIC_OR_DEPARTMENT_OTHER): Payer: Self-pay | Admitting: Emergency Medicine

## 2021-07-30 ENCOUNTER — Other Ambulatory Visit: Payer: Self-pay

## 2021-07-30 ENCOUNTER — Emergency Department (HOSPITAL_BASED_OUTPATIENT_CLINIC_OR_DEPARTMENT_OTHER)
Admission: EM | Admit: 2021-07-30 | Discharge: 2021-07-30 | Disposition: A | Discharge status: Home / Self Care | Payer: Medicare Other | Attending: Emergency Medicine | Admitting: Emergency Medicine

## 2021-07-30 DIAGNOSIS — J45909 Unspecified asthma, uncomplicated: Secondary | ICD-10-CM | POA: Insufficient documentation

## 2021-07-30 DIAGNOSIS — Z79899 Other long term (current) drug therapy: Secondary | ICD-10-CM | POA: Diagnosis not present

## 2021-07-30 DIAGNOSIS — Z7984 Long term (current) use of oral hypoglycemic drugs: Secondary | ICD-10-CM | POA: Insufficient documentation

## 2021-07-30 DIAGNOSIS — Z7982 Long term (current) use of aspirin: Secondary | ICD-10-CM | POA: Diagnosis not present

## 2021-07-30 DIAGNOSIS — I110 Hypertensive heart disease with heart failure: Secondary | ICD-10-CM | POA: Insufficient documentation

## 2021-07-30 DIAGNOSIS — Z853 Personal history of malignant neoplasm of breast: Secondary | ICD-10-CM | POA: Diagnosis not present

## 2021-07-30 DIAGNOSIS — Z20822 Contact with and (suspected) exposure to covid-19: Secondary | ICD-10-CM | POA: Insufficient documentation

## 2021-07-30 DIAGNOSIS — Z91040 Latex allergy status: Secondary | ICD-10-CM | POA: Insufficient documentation

## 2021-07-30 DIAGNOSIS — R059 Cough, unspecified: Secondary | ICD-10-CM | POA: Diagnosis present

## 2021-07-30 DIAGNOSIS — E1169 Type 2 diabetes mellitus with other specified complication: Secondary | ICD-10-CM | POA: Insufficient documentation

## 2021-07-30 DIAGNOSIS — I509 Heart failure, unspecified: Secondary | ICD-10-CM | POA: Diagnosis not present

## 2021-07-30 DIAGNOSIS — I2510 Atherosclerotic heart disease of native coronary artery without angina pectoris: Secondary | ICD-10-CM | POA: Diagnosis not present

## 2021-07-30 DIAGNOSIS — J069 Acute upper respiratory infection, unspecified: Secondary | ICD-10-CM | POA: Diagnosis not present

## 2021-07-30 DIAGNOSIS — Z951 Presence of aortocoronary bypass graft: Secondary | ICD-10-CM | POA: Insufficient documentation

## 2021-07-30 DIAGNOSIS — Z87891 Personal history of nicotine dependence: Secondary | ICD-10-CM | POA: Diagnosis not present

## 2021-07-30 DIAGNOSIS — E785 Hyperlipidemia, unspecified: Secondary | ICD-10-CM | POA: Insufficient documentation

## 2021-07-30 LAB — RESP PANEL BY RT-PCR (FLU A&B, COVID) ARPGX2
Influenza A by PCR: NEGATIVE
Influenza B by PCR: NEGATIVE
SARS Coronavirus 2 by RT PCR: NEGATIVE

## 2021-07-30 MED ORDER — BENZONATATE 100 MG PO CAPS: 100.0000 mg | ORAL_CAPSULE | Freq: Three times a day (TID) | ORAL | 0 refills | Status: DC

## 2021-07-30 MED ORDER — ACETAMINOPHEN 500 MG PO TABS
1000.0000 mg | ORAL_TABLET | Freq: Once | ORAL | Status: AC
  Administered 2021-07-30: 1000 mg via ORAL
  Filled 2021-07-30: qty 2

## 2021-07-30 MED ORDER — BENZONATATE 100 MG PO CAPS
200.0000 mg | ORAL_CAPSULE | Freq: Once | ORAL | Status: AC
  Administered 2021-07-30: 200 mg via ORAL
  Filled 2021-07-30: qty 2

## 2021-07-30 NOTE — ED Triage Notes (Signed)
Pt c/o productive cough with yellow sputum x 2 days. Pt c/o chest pain that worsens with cough and deep breathing.

## 2021-07-30 NOTE — ED Provider Notes (Signed)
[facility] EMERGENCY DEPARTMENT Provider Note   CSN: [id number] Arrival date & time: 07/30/21  0353     History Chief Complaint  Patient presents with   Cough    Patricia Cuevas is a 70 y.o. female.  The history is provided by the patient.  Cough Cough characteristics:  Non-productive Severity:  Moderate Onset quality:  Gradual Duration:  3 days Timing:  Intermittent Progression:  Unchanged Chronicity:  New Context: upper respiratory infection   Relieved by:  Nothing Worsened by:  Nothing Ineffective treatments:  None tried Associated symptoms: rhinorrhea and sinus congestion   Associated symptoms: no chest pain, no fever, no rash, no shortness of breath and no wheezing   Associated symptoms comment:  Pain with coughing  Rhinorrhea:    Quality:  Clear   Severity:  Mild   Duration:  3 days   Timing:  Constant   Progression:  Unchanged Risk factors: no chemical exposure       Past Medical History:  Diagnosis Date   Alcohol abuse    in the past, resolved   Arthritis    Asthma    Blood transfusion without reported diagnosis 1984   after surgery   Breast cancer ([place])    Double mastectomy 2007; s/p tamoxifen and arimidex therapy; no recurrence since 05/2008   CAD (coronary artery disease)    Nuclear stress test Feb 11, 2011, EF 64%, no scar or ischemia    //         catheterization, November, 2011,patent LIMA-LAD-small caliber distal vessel with diffuse plaque, patent SVG to OM1 and OM 2, patent SVG to PDA and PLA, occluded SVG to diagonal, medical therapy;  [place] 6/14:  Inferior thinning, no ischemia, EF 61%, low risk   CHF (congestive heart failure) (HCC)    Chronic back pain    Diabetes mellitus    Dyslipidemia    Ejection fraction    EF 65%, echo, [place], Jan 16, 2011   Hx of CABG    2007   Hx of colonic polyps    Hyperlipidemia    Hypertension    Myocardial infarction [facility])    Nausea & vomiting    hospitalization, November,  2011, stricture GE junction, questionable stenosis gastrojejunostomy, disruptive primary peristaltic wave, GE reflux, delayed emptying proximal gastric pouch   Normal cardiac stress test 02/2013   Overweight(278.02)    stomach stapling 1985 followed by weight loss, then return of weight   Sciatica    Sleep apnea    CPAP being arranged May, 2011//does not use CPAP   Tobacco abuse    in the past, resolved   UTI (urinary tract infection)     Patient Active Problem List   Diagnosis Date Noted   Dark stools 11/11/2020   Family history of stomach cancer 11/11/2020   Abdominal pain 11/11/2020   Asthma 03/07/2017   Sleep apnea 03/07/2017   Diabetes mellitus type 2, controlled ([place]) 03/07/2017   Hyperlipidemia 03/07/2017   CAD (coronary artery disease) with prior CABG 03/07/2017   UTI (urinary tract infection) 03/07/2017   Sepsis ([place]) 03/07/2017   Sepsis due to urinary tract infection ([place]) 03/07/2017   HTN (hypertension) 03/07/2017   Constipation 03/07/2017   Abnormal nuclear stress test 12/20/2015   Angina, class III ([place]) 12/20/2015   Chest pain 11/12/2014   Upper GI bleed 03/04/2013   Atypical chest pain 01/07/2013   History of tobacco abuse 01/07/2013   SIRS (systemic inflammatory response syndrome) ([place]) 07/28/2012  Tachycardia 07/28/2012   GERD (gastroesophageal reflux disease) 02/24/2011   Coronary artery disease involving native coronary artery with angina pectoris (HCC)    Hx of CABG    Overweight    OSA (obstructive sleep apnea)    Nausea & vomiting    Breast cancer ([place])    Hypertension    Diabetes mellitus ([place])    Dyslipidemia    Ejection fraction     Past Surgical History:  Procedure Laterality Date   ABDOMINAL HYSTERECTOMY  1989   CARDIAC CATHETERIZATION N/A 12/20/2015   Procedure: Left Heart Cath and Cors/Grafts Angiography;  Surgeon: Leonie Man, MD;  Location: [facility];  Service: Cardiovascular;  Laterality: N/A;   CORONARY ARTERY BYPASS  GRAFT  2009   CORONARY STENT PLACEMENT     GASTRIC BYPASS  1983   open.  [place]   MASTECTOMY Bilateral 1987     OB History   No obstetric history on file.     Family History  Problem Relation Age of Onset   Hypertension Mother    Heart disease Mother    Diabetes Mother    Heart disease Father    Colon cancer Father        does not know age of onset   Heart disease Sister    Colon cancer Sister 35   Stomach cancer Sister    Diabetes Sister    Asthma Sister    Diabetes Sister    Esophageal cancer Neg Hx    Rectal cancer Neg Hx     Social History   Tobacco Use   Smoking status: Former    Packs/day: 3.00    Years: 27.00    Pack years: 81.00    Types: Cigarettes    Quit date: 09/14/2000    Years since quitting: 20.8   Smokeless tobacco: Never  Vaping Use   Vaping Use: Never used  Substance Use Topics   Alcohol use: No   Drug use: No    Home Medications Prior to Admission medications   Medication Sig Start Date End Date Taking? Authorizing Provider  albuterol (PROVENTIL HFA;VENTOLIN HFA) 108 (90 BASE) MCG/ACT inhaler Inhale 2 puffs into the lungs every 6 (six) hours as needed for shortness of breath.    [provider]  aspirin EC 81 MG tablet Take 81 mg by mouth daily.     [provider]  cetirizine (ZYRTEC) 10 MG tablet Take 1 tablet (10 mg total) by mouth daily. Patient taking differently: Take 10 mg by mouth daily as needed for allergies. 08/16/17   Maczis, Barth Kirks, PA-C  cholecalciferol (VITAMIN D3) 25 MCG (1000 UT) tablet Take 1,000 Units by mouth daily.    [provider]  diclofenac Sodium (VOLTAREN) 1 % GEL Apply 4 g topically 4 (four) times daily. 05/01/21   Deno Etienne, DO  fluticasone (FLONASE) 50 MCG/ACT nasal spray Place 2 sprays into both nostrils daily. Patient taking differently: Place 2 sprays into both nostrils daily as needed for allergies. 08/16/17   Maczis, Barth Kirks, PA-C  glipiZIDE (GLUCOTROL) 10 MG tablet Take  10 mg by mouth every morning. 12/06/20   [provider]  HYDROcodone-acetaminophen (NORCO) 10-325 MG tablet Take 1 tablet by mouth 2 (two) times daily as needed.    [provider]  latanoprost (XALATAN) 0.005 % ophthalmic solution Place 1 drop into both eyes at bedtime as needed (dry eyes).    [provider]  lisinopril-hydrochlorothiazide (PRINZIDE,ZESTORETIC) 20-12.5 MG per tablet Take  1 tablet by mouth daily.    [provider]  magnesium oxide (MAG-OX) 400 MG tablet Take 400 mg by mouth daily as needed (leg cramps). 01/13/21   [provider]  metoprolol tartrate (LOPRESSOR) 25 MG tablet TAKE 1 TABLET BY MOUTH 2 TIMES A DAY. Patient taking differently: Take 25 mg by mouth 2 (two) times daily. 10/03/20   [name], MD  montelukast (SINGULAIR) 10 MG tablet Take 10 mg by mouth at bedtime. 08/14/19   [provider]  nitroGLYCERIN (NITROSTAT) 0.4 MG SL tablet Place 1 tablet (0.4 mg total) under the tongue every 5 (five) minutes as needed for chest pain. 07/22/21   Loel Dubonnet, NP  phenazopyridine (PYRIDIUM) 200 MG tablet Take 1 tablet (200 mg total) by mouth 3 (three) times daily as needed for pain. 08/10/20   [name], MD  rosuvastatin (CRESTOR) 10 MG tablet Take 1 tablet (10 mg total) by mouth at bedtime. 08/26/18   [name], MD    Allergies    Latex  Review of Systems   Review of Systems  Constitutional:  Negative for fever.  HENT:  Positive for rhinorrhea. Negative for facial swelling.   Eyes:  Negative for redness.  Respiratory:  Positive for cough. Negative for shortness of breath, wheezing and stridor.   Cardiovascular:  Negative for chest pain, palpitations and leg swelling.  Gastrointestinal:  Negative for diarrhea.  Genitourinary:  Negative for difficulty urinating.  Musculoskeletal:  Negative for neck stiffness.  Skin:  Negative for rash.  Neurological:  Negative for facial asymmetry.   Psychiatric/Behavioral:  Negative for agitation.   All other systems reviewed and are negative.  Physical Exam Updated Vital Signs BP 108/66   Pulse (!) 53   Temp 98.3 F (36.8 C)   Resp 15   Ht 4\' 10"  (1.473 m)   Wt 91.6 kg   SpO2 94%   BMI 42.22 kg/m   Physical Exam Vitals and nursing note reviewed.  Constitutional:      General: She is not in acute distress.    Appearance: Normal appearance.  HENT:     Head: Normocephalic and atraumatic.     Nose: Rhinorrhea present.  Eyes:     Conjunctiva/sclera: Conjunctivae normal.     Pupils: Pupils are equal, round, and reactive to light.  Cardiovascular:     Rate and Rhythm: Normal rate and regular rhythm.     Pulses: Normal pulses.     Heart sounds: Normal heart sounds.  Pulmonary:     Effort: Pulmonary effort is normal. No respiratory distress.     Breath sounds: Normal breath sounds. No wheezing, rhonchi or rales.  Abdominal:     General: Abdomen is flat. Bowel sounds are normal.     Palpations: Abdomen is soft.     Tenderness: There is no abdominal tenderness. There is no guarding.  Musculoskeletal:        General: Normal range of motion.     Cervical back: Normal range of motion and neck supple.  Skin:    General: Skin is warm and dry.     Capillary Refill: Capillary refill takes less than 2 seconds.  Neurological:     General: No focal deficit present.     Mental Status: She is alert and oriented to person, place, and time.     Deep Tendon Reflexes: Reflexes normal.  Psychiatric:        Mood and Affect: Mood normal.  Behavior: Behavior normal.    ED Results / Procedures / Treatments   Labs (all labs ordered are listed, but only abnormal results are displayed) Labs Reviewed  RESP PANEL BY RT-PCR (FLU A&B, COVID) [id number]    EKG EKG Interpretation  Date/Time:  Wednesday July 30 2021 04:02:00 EST Ventricular Rate:  72 PR Interval:  165 QRS Duration: 91 QT Interval:  392 QTC Calculation: 429 R  Axis:   12 Text Interpretation: Sinus rhythm Confirmed by Randal Buba, Juelz Claar ([id number]) on 07/30/2021 4:52:09 AM  Radiology No results found.  Procedures Procedures   Medications Ordered in ED Medications  acetaminophen (TYLENOL) tablet 1,000 mg (1,000 mg Oral Given 07/30/21 0416)  benzonatate (TESSALON) capsule 200 mg (200 mg Oral Given 07/30/21 0416)    ED Course  I have reviewed the triage vital signs and the nursing notes.  Pertinent labs & imaging results that were available during my care of the patient were reviewed by me and considered in my medical decision making (see chart for details).    MDM Rules/Calculators/A&P                           No PNA, no flu or covid.  Patient has a URI with cough, will prescribe tessalon.  Stable for discharge with close follow up.    Patricia Cuevas was evaluated in Emergency Department on 07/30/2021 for the symptoms described in the history of present illness. She was evaluated in the context of the global COVID-19 pandemic, which necessitated consideration that the patient might be at risk for infection with the SARS-CoV-2 virus that causes COVID-19. Institutional protocols and algorithms that pertain to the evaluation of patients at risk for COVID-19 are in a state of rapid change based on information released by regulatory bodies including the CDC and federal and state organizations. These policies and algorithms were followed during the patient's care in the ED.  Final Clinical Impression(s) / ED Diagnoses Final diagnoses:  Viral URI with cough   Return for intractable cough, coughing up blood, fevers > 100.4 unrelieved by medication, shortness of breath, intractable vomiting, chest pain, shortness of breath, weakness, numbness, changes in speech, facial asymmetry, abdominal pain, passing out, Inability to tolerate liquids or food, cough, altered mental status or any concerns. No signs of systemic illness or infection. The patient is  nontoxic-appearing on exam and vital signs are within normal limits.  I have reviewed the triage vital signs and the nursing notes. Pertinent labs & imaging results that were available during my care of the patient were reviewed by me and considered in my medical decision making (see chart for details). After history, exam, and medical workup I feel the patient has been appropriately medically screened and is safe for discharge home. Pertinent diagnoses were discussed with the patient. Patient was given return precautions. Rx / DC Orders ED Discharge Orders     None        Chayton Murata, MD 07/30/21 [phone]

## 2021-07-30 NOTE — ED Notes (Signed)
Patient discharged to home.  All discharge instructions reviewed.  Patient verbalized understanding via teachback method.  VS WDL.  Respirations even and unlabored.  Ambulatory out of ED.   °

## 2021-08-25 ENCOUNTER — Emergency Department (HOSPITAL_BASED_OUTPATIENT_CLINIC_OR_DEPARTMENT_OTHER): Payer: Medicare Other

## 2021-08-25 ENCOUNTER — Other Ambulatory Visit: Payer: Self-pay

## 2021-08-25 ENCOUNTER — Emergency Department (HOSPITAL_BASED_OUTPATIENT_CLINIC_OR_DEPARTMENT_OTHER)
Admission: EM | Admit: 2021-08-25 | Discharge: 2021-08-25 | Disposition: A | Discharge status: Home / Self Care | Payer: Medicare Other | Attending: Emergency Medicine | Admitting: Emergency Medicine

## 2021-08-25 ENCOUNTER — Encounter (HOSPITAL_BASED_OUTPATIENT_CLINIC_OR_DEPARTMENT_OTHER): Payer: Self-pay | Admitting: *Deleted

## 2021-08-25 DIAGNOSIS — I509 Heart failure, unspecified: Secondary | ICD-10-CM | POA: Diagnosis not present

## 2021-08-25 DIAGNOSIS — Z950 Presence of cardiac pacemaker: Secondary | ICD-10-CM | POA: Diagnosis not present

## 2021-08-25 DIAGNOSIS — Z87891 Personal history of nicotine dependence: Secondary | ICD-10-CM | POA: Diagnosis not present

## 2021-08-25 DIAGNOSIS — J45909 Unspecified asthma, uncomplicated: Secondary | ICD-10-CM | POA: Insufficient documentation

## 2021-08-25 DIAGNOSIS — R079 Chest pain, unspecified: Secondary | ICD-10-CM

## 2021-08-25 DIAGNOSIS — Z7982 Long term (current) use of aspirin: Secondary | ICD-10-CM | POA: Diagnosis not present

## 2021-08-25 DIAGNOSIS — I110 Hypertensive heart disease with heart failure: Secondary | ICD-10-CM | POA: Insufficient documentation

## 2021-08-25 DIAGNOSIS — Z79899 Other long term (current) drug therapy: Secondary | ICD-10-CM | POA: Diagnosis not present

## 2021-08-25 DIAGNOSIS — Z853 Personal history of malignant neoplasm of breast: Secondary | ICD-10-CM | POA: Diagnosis not present

## 2021-08-25 DIAGNOSIS — I2510 Atherosclerotic heart disease of native coronary artery without angina pectoris: Secondary | ICD-10-CM | POA: Insufficient documentation

## 2021-08-25 DIAGNOSIS — E1169 Type 2 diabetes mellitus with other specified complication: Secondary | ICD-10-CM | POA: Insufficient documentation

## 2021-08-25 DIAGNOSIS — Z955 Presence of coronary angioplasty implant and graft: Secondary | ICD-10-CM | POA: Diagnosis not present

## 2021-08-25 DIAGNOSIS — R001 Bradycardia, unspecified: Secondary | ICD-10-CM | POA: Diagnosis not present

## 2021-08-25 DIAGNOSIS — Z91040 Latex allergy status: Secondary | ICD-10-CM | POA: Insufficient documentation

## 2021-08-25 DIAGNOSIS — Z7984 Long term (current) use of oral hypoglycemic drugs: Secondary | ICD-10-CM | POA: Diagnosis not present

## 2021-08-25 LAB — HEPATIC FUNCTION PANEL
ALT: 17 U/L (ref 0–44)
AST: 27 U/L (ref 15–41)
Albumin: 4 g/dL (ref 3.5–5.0)
Alkaline Phosphatase: 74 U/L (ref 38–126)
Bilirubin, Direct: 0.1 mg/dL (ref 0.0–0.2)
Indirect Bilirubin: 0.2 mg/dL — ABNORMAL LOW (ref 0.3–0.9)
Total Bilirubin: 0.3 mg/dL (ref 0.3–1.2)
Total Protein: 7.5 g/dL (ref 6.5–8.1)

## 2021-08-25 LAB — BASIC METABOLIC PANEL
Anion gap: 8 (ref 5–15)
BUN: 11 mg/dL (ref 8–23)
CO2: 29 mmol/L (ref 22–32)
Calcium: 9.8 mg/dL (ref 8.9–10.3)
Chloride: 101 mmol/L (ref 98–111)
Creatinine, Ser: 0.93 mg/dL (ref 0.44–1.00)
GFR, Estimated: >60 mL/min (ref >60)
Glucose, Bld: 128 mg/dL — ABNORMAL HIGH (ref 70–99)
Potassium: 4.9 mmol/L (ref 3.5–5.1)
Sodium: 138 mmol/L (ref 135–145)

## 2021-08-25 LAB — CBC
HCT: 43.6 % (ref 36.0–46.0)
Hemoglobin: 14 g/dL (ref 12.0–15.0)
MCH: 28.6 pg (ref 26.0–34.0)
MCHC: 32.1 g/dL (ref 30.0–36.0)
MCV: 89.2 fL (ref 80.0–100.0)
Platelets: 200 10*3/uL (ref 150–400)
RBC: 4.89 MIL/uL (ref 3.87–5.11)
RDW: 14.6 % (ref 11.5–15.5)
WBC: 6.2 10*3/uL (ref 4.0–10.5)
nRBC: 0 % (ref 0.0–0.2)

## 2021-08-25 LAB — LIPASE, BLOOD: Lipase: 35 U/L (ref 11–51)

## 2021-08-25 LAB — TROPONIN I (HIGH SENSITIVITY)
Troponin I (High Sensitivity): 3 ng/L (ref <18)
Troponin I (High Sensitivity): 3 ng/L (ref <18)

## 2021-08-25 NOTE — ED Notes (Signed)
Ambulatory to restroom without assistance. States pain is better

## 2021-08-25 NOTE — ED Provider Notes (Signed)
[facility] EMERGENCY DEPARTMENT Provider Note   CSN: [id number] Arrival date & time: 08/25/21  1635     History No chief complaint on file.   Patricia Cuevas is a 70 y.o. female.  HPI Patient with chest pain.  Lower chest.  States it feels somewhat like her previous heart problems.  Began around 1:00.  No nausea or vomiting.  No fevers or chills.  Does have a cardiac history.  Has some known residual disease that is not amenable to stenting.  No fevers or chills.  No nausea or vomiting.  No real abdominal pain.  Just over a month ago had cardiac visit and did not have any chest pain at that time.Has been doing well the last few days.  Normal activity.    Past Medical History:  Diagnosis Date   Alcohol abuse    in the past, resolved   Arthritis    Asthma    Blood transfusion without reported diagnosis 1984   after surgery   Breast cancer ([place])    Double mastectomy 2007; s/p tamoxifen and arimidex therapy; no recurrence since 05/2008   CAD (coronary artery disease)    Nuclear stress test Feb 11, 2011, EF 64%, no scar or ischemia    //         catheterization, November, 2011,patent LIMA-LAD-small caliber distal vessel with diffuse plaque, patent SVG to OM1 and OM 2, patent SVG to PDA and PLA, occluded SVG to diagonal, medical therapy;  [place] 6/14:  Inferior thinning, no ischemia, EF 61%, low risk   CHF (congestive heart failure) (HCC)    Chronic back pain    Diabetes mellitus    Dyslipidemia    Ejection fraction    EF 65%, echo, [place], Jan 16, 2011   Hx of CABG    2007   Hx of colonic polyps    Hyperlipidemia    Hypertension    Myocardial infarction [facility])    Nausea & vomiting    hospitalization, November, 2011, stricture GE junction, questionable stenosis gastrojejunostomy, disruptive primary peristaltic wave, GE reflux, delayed emptying proximal gastric pouch   Normal cardiac stress test 02/2013   Overweight(278.02)    stomach stapling 1985  followed by weight loss, then return of weight   Sciatica    Sleep apnea    CPAP being arranged May, 2011//does not use CPAP   Tobacco abuse    in the past, resolved   UTI (urinary tract infection)     Patient Active Problem List   Diagnosis Date Noted   Dark stools 11/11/2020   Family history of stomach cancer 11/11/2020   Abdominal pain 11/11/2020   Asthma 03/07/2017   Sleep apnea 03/07/2017   Diabetes mellitus type 2, controlled ([place]) 03/07/2017   Hyperlipidemia 03/07/2017   CAD (coronary artery disease) with prior CABG 03/07/2017   UTI (urinary tract infection) 03/07/2017   Sepsis ([place]) 03/07/2017   Sepsis due to urinary tract infection () 03/07/2017   HTN (hypertension) 03/07/2017   Constipation 03/07/2017   Abnormal nuclear stress test 12/20/2015   Angina, class III ([place]) 12/20/2015   Chest pain 11/12/2014   Upper GI bleed 03/04/2013   Atypical chest pain 01/07/2013   History of tobacco abuse 01/07/2013   SIRS (systemic inflammatory response syndrome) ([place]) 07/28/2012   Tachycardia 07/28/2012   GERD (gastroesophageal reflux disease) 02/24/2011   Coronary artery disease involving native coronary artery with angina pectoris ([place])    Hx of CABG    Overweight  OSA (obstructive sleep apnea)    Nausea & vomiting    Breast cancer (HCC)    Hypertension    Diabetes mellitus ([place])    Dyslipidemia    Ejection fraction     Past Surgical History:  Procedure Laterality Date   ABDOMINAL HYSTERECTOMY  1989   CARDIAC CATHETERIZATION N/A 12/20/2015   Procedure: Left Heart Cath and Cors/Grafts Angiography;  Surgeon: Leonie Man, MD;  Location: [facility];  Service: Cardiovascular;  Laterality: N/A;   CORONARY ARTERY BYPASS GRAFT  2009   CORONARY STENT PLACEMENT     GASTRIC BYPASS  1983   open.  [place]   MASTECTOMY Bilateral 1987     OB History   No obstetric history on file.     Family History  Problem Relation Age of Onset   Hypertension Mother     Heart disease Mother    Diabetes Mother    Heart disease Father    Colon cancer Father        does not know age of onset   Heart disease Sister    Colon cancer Sister 1   Stomach cancer Sister    Diabetes Sister    Asthma Sister    Diabetes Sister    Esophageal cancer Neg Hx    Rectal cancer Neg Hx     Social History   Tobacco Use   Smoking status: Former    Packs/day: 3.00    Years: 27.00    Pack years: 81.00    Types: Cigarettes    Quit date: 09/14/2000    Years since quitting: 20.9   Smokeless tobacco: Never  Vaping Use   Vaping Use: Never used  Substance Use Topics   Alcohol use: No   Drug use: No    Home Medications Prior to Admission medications   Medication Sig Start Date End Date Taking? Authorizing Provider  albuterol (PROVENTIL HFA;VENTOLIN HFA) 108 (90 BASE) MCG/ACT inhaler Inhale 2 puffs into the lungs every 6 (six) hours as needed for shortness of breath.    [provider]  aspirin EC 81 MG tablet Take 81 mg by mouth daily.     [provider]  benzonatate (TESSALON) 100 MG capsule Take 1 capsule (100 mg total) by mouth every 8 (eight) hours. 07/30/21   Palumbo, April, MD  cetirizine (ZYRTEC) 10 MG tablet Take 1 tablet (10 mg total) by mouth daily. Patient taking differently: Take 10 mg by mouth daily as needed for allergies. 08/16/17   Maczis, Barth Kirks, PA-C  cholecalciferol (VITAMIN D3) 25 MCG (1000 UT) tablet Take 1,000 Units by mouth daily.    [provider]  diclofenac Sodium (VOLTAREN) 1 % GEL Apply 4 g topically 4 (four) times daily. 05/01/21   Deno Etienne, DO  fluticasone (FLONASE) 50 MCG/ACT nasal spray Place 2 sprays into both nostrils daily. Patient taking differently: Place 2 sprays into both nostrils daily as needed for allergies. 08/16/17   Maczis, Barth Kirks, PA-C  glipiZIDE (GLUCOTROL) 10 MG tablet Take 10 mg by mouth every morning. 12/06/20   [provider]  HYDROcodone-acetaminophen (NORCO) 10-325 MG  tablet Take 1 tablet by mouth 2 (two) times daily as needed.    [provider]  latanoprost (XALATAN) 0.005 % ophthalmic solution Place 1 drop into both eyes at bedtime as needed (dry eyes).    [provider]  lisinopril-hydrochlorothiazide (PRINZIDE,ZESTORETIC) 20-12.5 MG per tablet Take 1 tablet by mouth daily.    [provider]  magnesium oxide (MAG-OX) 400 MG tablet Take 400 mg by mouth daily as needed (leg cramps). 01/13/21   [provider]  metoprolol tartrate (LOPRESSOR) 25 MG tablet TAKE 1 TABLET BY MOUTH 2 TIMES A DAY. Patient taking differently: Take 25 mg by mouth 2 (two) times daily. 10/03/20   [name], MD  montelukast (SINGULAIR) 10 MG tablet Take 10 mg by mouth at bedtime. 08/14/19   [provider]  nitroGLYCERIN (NITROSTAT) 0.4 MG SL tablet Place 1 tablet (0.4 mg total) under the tongue every 5 (five) minutes as needed for chest pain. 07/22/21   Loel Dubonnet, NP  phenazopyridine (PYRIDIUM) 200 MG tablet Take 1 tablet (200 mg total) by mouth 3 (three) times daily as needed for pain. 08/10/20   [name], MD  rosuvastatin (CRESTOR) 10 MG tablet Take 1 tablet (10 mg total) by mouth at bedtime. 08/26/18   [name], MD    Allergies    Latex  Review of Systems   Review of Systems  Constitutional:  Negative for chills.  HENT:  Negative for congestion.   Respiratory:  Negative for shortness of breath.   Cardiovascular:  Positive for chest pain.  Gastrointestinal:  Negative for anal bleeding.  Genitourinary:  Negative for flank pain.  Musculoskeletal:  Negative for back pain.  Skin:  Negative for rash.  Neurological:  Negative for weakness.  Psychiatric/Behavioral:  Negative for confusion.    Physical Exam Updated Vital Signs BP 136/83   Pulse (!) 49   Temp 97.7 F (36.5 C) (Oral)   Resp 16   Ht 4\' 10"  (1.473 m)   Wt 91.6 kg   SpO2 97%   BMI 42.21 kg/m   Physical Exam Vitals and nursing note  reviewed.  HENT:     Head: Normocephalic.  Pulmonary:     Comments: Tenderness to anterior lower chest wall. Chest:     Chest wall: Tenderness present.  Abdominal:     Tenderness: There is no abdominal tenderness.  Musculoskeletal:        General: No tenderness.  Skin:    Findings: No erythema.  Neurological:     Mental Status: She is alert.    ED Results / Procedures / Treatments   Labs (all labs ordered are listed, but only abnormal results are displayed) Labs Reviewed  BASIC METABOLIC PANEL - Abnormal; Notable for the following components:      Result Value   Glucose, Bld 128 (*)    All other components within normal limits  HEPATIC FUNCTION PANEL - Abnormal; Notable for the following components:   Indirect Bilirubin 0.2 (*)    All other components within normal limits  CBC  LIPASE, BLOOD  TROPONIN I (HIGH SENSITIVITY)  TROPONIN I (HIGH SENSITIVITY)    EKG EKG Interpretation  Date/Time:  Monday August 25 2021 16:46:29 EST Ventricular Rate:  44 PR Interval:  144 QRS Duration: 78 QT Interval:  436 QTC Calculation: 372 R Axis:   1 Text Interpretation: Marked sinus bradycardia with Premature atrial complexes Abnormal ECG rate slower than prior Confirmed by Davonna Belling [phone]) on 08/25/2021 5:17:24 PM  Radiology DG Chest 2 View  Result Date: 08/25/2021 CLINICAL DATA:  Sharp epigastric stabbing pain for 1 day EXAM: CHEST - 2 VIEW COMPARISON:  07/30/2021 FINDINGS: Frontal and lateral views of the chest demonstrates stable postsurgical changes from CABG. Cardiac silhouette is stable. Chronic elevation of the right hemidiaphragm. No airspace disease, effusion, or pneumothorax. Postsurgical changes right axilla. No acute  bony abnormalities. IMPRESSION: 1. Stable chest, no acute process. Electronically Signed   By: Randa Ngo M.D.   On: 08/25/2021 17:15    Procedures Procedures   Medications Ordered in ED Medications - No data to display  ED Course  I  have reviewed the triage vital signs and the nursing notes.  Pertinent labs & imaging results that were available during my care of the patient were reviewed by me and considered in my medical decision making (see chart for details).    MDM Rules/Calculators/A&P                           Patient presents with chest pain.  Anterior chest.  Worse with palpation over the sternum.  X-ray reassuring.  EKG stable.  Cardiac enzymes negative x2.  Has had previous chest pains similar to this.  Also has known cardiac disease however.  Has some chronic lesions that are not amenable towards repair.  With troponin negative x2 it is reassuring however.  EKG reassuring.  Doubt cardiac ischemia.  No pneumonia.  Will discharge home with outpatient follow-up as needed.  Abdomen really not tender. Final Clinical Impression(s) / ED Diagnoses Final diagnoses:  Chest pain, unspecified type    Rx / DC Orders ED Discharge Orders     None        Davonna Belling, MD 08/25/21 2034

## 2021-08-25 NOTE — ED Triage Notes (Signed)
Sharp epigastric stabbing pain today. She was seen for same 3 weeks ago. EKG at triage.

## 2021-09-28 ENCOUNTER — Other Ambulatory Visit: Payer: Self-pay

## 2021-09-28 ENCOUNTER — Encounter (HOSPITAL_BASED_OUTPATIENT_CLINIC_OR_DEPARTMENT_OTHER): Payer: Self-pay

## 2021-09-28 ENCOUNTER — Emergency Department (HOSPITAL_BASED_OUTPATIENT_CLINIC_OR_DEPARTMENT_OTHER): Payer: Medicare Other

## 2021-09-28 ENCOUNTER — Other Ambulatory Visit: Payer: Self-pay | Admitting: Cardiology

## 2021-09-28 ENCOUNTER — Emergency Department (HOSPITAL_BASED_OUTPATIENT_CLINIC_OR_DEPARTMENT_OTHER)
Admission: EM | Admit: 2021-09-28 | Discharge: 2021-09-28 | Disposition: A | Discharge status: Home / Self Care | Payer: Medicare Other | Attending: Emergency Medicine | Admitting: Emergency Medicine

## 2021-09-28 DIAGNOSIS — E119 Type 2 diabetes mellitus without complications: Secondary | ICD-10-CM | POA: Diagnosis not present

## 2021-09-28 DIAGNOSIS — I2510 Atherosclerotic heart disease of native coronary artery without angina pectoris: Secondary | ICD-10-CM | POA: Diagnosis not present

## 2021-09-28 DIAGNOSIS — R1030 Lower abdominal pain, unspecified: Secondary | ICD-10-CM

## 2021-09-28 DIAGNOSIS — N3001 Acute cystitis with hematuria: Secondary | ICD-10-CM | POA: Diagnosis not present

## 2021-09-28 DIAGNOSIS — Z7982 Long term (current) use of aspirin: Secondary | ICD-10-CM | POA: Diagnosis not present

## 2021-09-28 DIAGNOSIS — I10 Essential (primary) hypertension: Secondary | ICD-10-CM | POA: Diagnosis not present

## 2021-09-28 DIAGNOSIS — Z91040 Latex allergy status: Secondary | ICD-10-CM | POA: Insufficient documentation

## 2021-09-28 DIAGNOSIS — R300 Dysuria: Secondary | ICD-10-CM

## 2021-09-28 DIAGNOSIS — Z955 Presence of coronary angioplasty implant and graft: Secondary | ICD-10-CM | POA: Insufficient documentation

## 2021-09-28 DIAGNOSIS — Z7984 Long term (current) use of oral hypoglycemic drugs: Secondary | ICD-10-CM | POA: Diagnosis not present

## 2021-09-28 DIAGNOSIS — Z79899 Other long term (current) drug therapy: Secondary | ICD-10-CM | POA: Diagnosis not present

## 2021-09-28 DIAGNOSIS — R0789 Other chest pain: Secondary | ICD-10-CM

## 2021-09-28 LAB — COMPREHENSIVE METABOLIC PANEL
ALT: 15 U/L (ref 0–44)
AST: 25 U/L (ref 15–41)
Albumin: 3.6 g/dL (ref 3.5–5.0)
Alkaline Phosphatase: 63 U/L (ref 38–126)
Anion gap: 8 (ref 5–15)
BUN: 11 mg/dL (ref 8–23)
CO2: 25 mmol/L (ref 22–32)
Calcium: 9.1 mg/dL (ref 8.9–10.3)
Chloride: 105 mmol/L (ref 98–111)
Creatinine, Ser: 0.8 mg/dL (ref 0.44–1.00)
GFR, Estimated: >60 mL/min (ref >60)
Glucose, Bld: 179 mg/dL — ABNORMAL HIGH (ref 70–99)
Potassium: 3.9 mmol/L (ref 3.5–5.1)
Sodium: 138 mmol/L (ref 135–145)
Total Bilirubin: 0.6 mg/dL (ref 0.3–1.2)
Total Protein: 7 g/dL (ref 6.5–8.1)

## 2021-09-28 LAB — CBC WITH DIFFERENTIAL/PLATELET
Abs Immature Granulocytes: 0.01 10*3/uL (ref 0.00–0.07)
Basophils Absolute: 0 10*3/uL (ref 0.0–0.1)
Basophils Relative: 0 %
Eosinophils Absolute: 0.1 10*3/uL (ref 0.0–0.5)
Eosinophils Relative: 1 %
HCT: 41.5 % (ref 36.0–46.0)
Hemoglobin: 13.5 g/dL (ref 12.0–15.0)
Immature Granulocytes: 0 %
Lymphocytes Relative: 44 %
Lymphs Abs: 3 10*3/uL (ref 0.7–4.0)
MCH: 28.4 pg (ref 26.0–34.0)
MCHC: 32.5 g/dL (ref 30.0–36.0)
MCV: 87.4 fL (ref 80.0–100.0)
Monocytes Absolute: 0.6 10*3/uL (ref 0.1–1.0)
Monocytes Relative: 9 %
Neutro Abs: 3 10*3/uL (ref 1.7–7.7)
Neutrophils Relative %: 46 %
Platelets: 152 10*3/uL (ref 150–400)
RBC: 4.75 MIL/uL (ref 3.87–5.11)
RDW: 14.6 % (ref 11.5–15.5)
WBC: 6.7 10*3/uL (ref 4.0–10.5)
nRBC: 0 % (ref 0.0–0.2)

## 2021-09-28 LAB — URINALYSIS, MICROSCOPIC (REFLEX): WBC, UA: >50 WBC/hpf (ref 0–5)

## 2021-09-28 LAB — URINALYSIS, ROUTINE W REFLEX MICROSCOPIC
Bilirubin Urine: NEGATIVE
Glucose, UA: NEGATIVE mg/dL
Ketones, ur: NEGATIVE mg/dL
Nitrite: POSITIVE — AB
Protein, ur: 100 mg/dL — AB
Specific Gravity, Urine: >=1.03 (ref 1.005–1.030)
pH: 6.5 (ref 5.0–8.0)

## 2021-09-28 MED ORDER — CEPHALEXIN 500 MG PO CAPS: 500.0000 mg | ORAL_CAPSULE | Freq: Two times a day (BID) | ORAL | 0 refills | Status: AC

## 2021-09-28 MED ORDER — CEPHALEXIN 250 MG PO CAPS
500.0000 mg | ORAL_CAPSULE | Freq: Once | ORAL | Status: AC
Start: 2021-09-28 — End: 2021-09-28
  Administered 2021-09-28: 500 mg via ORAL
  Filled 2021-09-28: qty 2

## 2021-09-28 NOTE — ED Triage Notes (Signed)
Pt arrives with c/o pain to lower abdomen, states she saw some blood after urinating.

## 2021-09-28 NOTE — ED Provider Notes (Signed)
[facility] EMERGENCY DEPARTMENT Provider Note   CSN: [id number] Arrival date & time: 09/28/21  1324     History  Chief Complaint  Patient presents with   Abdominal Pain    Patricia Cuevas is a 71 y.o. female.  The history is provided by the patient and medical records. No language interpreter was used.  Abdominal Pain Pain location:  Suprapubic Pain quality: aching and burning   Pain radiates to:  Does not radiate Pain severity:  Severe Onset quality:  Gradual Duration:  3 days Timing:  Constant Progression:  Waxing and waning Chronicity:  New Context: not trauma   Relieved by:  Nothing Worsened by:  Urination Ineffective treatments:  None tried Associated symptoms: dysuria and hematuria   Associated symptoms: no chest pain, no chills, no constipation, no cough, no diarrhea, no fatigue, no fever, no nausea, no shortness of breath and no vomiting       Home Medications Prior to Admission medications   Medication Sig Start Date End Date Taking? Authorizing Provider  albuterol (PROVENTIL HFA;VENTOLIN HFA) 108 (90 BASE) MCG/ACT inhaler Inhale 2 puffs into the lungs every 6 (six) hours as needed for shortness of breath.    [provider]  aspirin EC 81 MG tablet Take 81 mg by mouth daily.     [provider]  benzonatate (TESSALON) 100 MG capsule Take 1 capsule (100 mg total) by mouth every 8 (eight) hours. 07/30/21   Palumbo, April, MD  cetirizine (ZYRTEC) 10 MG tablet Take 1 tablet (10 mg total) by mouth daily. Patient taking differently: Take 10 mg by mouth daily as needed for allergies. 08/16/17   Maczis, Barth Kirks, PA-C  cholecalciferol (VITAMIN D3) 25 MCG (1000 UT) tablet Take 1,000 Units by mouth daily.    [provider]  diclofenac Sodium (VOLTAREN) 1 % GEL Apply 4 g topically 4 (four) times daily. 05/01/21   Deno Etienne, DO  fluticasone (FLONASE) 50 MCG/ACT nasal spray Place 2 sprays into both nostrils daily. Patient taking  differently: Place 2 sprays into both nostrils daily as needed for allergies. 08/16/17   Maczis, Barth Kirks, PA-C  glipiZIDE (GLUCOTROL) 10 MG tablet Take 10 mg by mouth every morning. 12/06/20   [provider]  HYDROcodone-acetaminophen (NORCO) 10-325 MG tablet Take 1 tablet by mouth 2 (two) times daily as needed.    [provider]  latanoprost (XALATAN) 0.005 % ophthalmic solution Place 1 drop into both eyes at bedtime as needed (dry eyes).    [provider]  lisinopril-hydrochlorothiazide (PRINZIDE,ZESTORETIC) 20-12.5 MG per tablet Take 1 tablet by mouth daily.    [provider]  magnesium oxide (MAG-OX) 400 MG tablet Take 400 mg by mouth daily as needed (leg cramps). 01/13/21   [provider]  metoprolol tartrate (LOPRESSOR) 25 MG tablet TAKE 1 TABLET BY MOUTH 2 TIMES A DAY. Patient taking differently: Take 25 mg by mouth 2 (two) times daily. 10/03/20   [name], MD  montelukast (SINGULAIR) 10 MG tablet Take 10 mg by mouth at bedtime. 08/14/19   [provider]  nitroGLYCERIN (NITROSTAT) 0.4 MG SL tablet Place 1 tablet (0.4 mg total) under the tongue every 5 (five) minutes as needed for chest pain. 07/22/21   Loel Dubonnet, NP  phenazopyridine (PYRIDIUM) 200 MG tablet Take 1 tablet (200 mg total) by mouth 3 (three) times daily as needed for pain. 08/10/20   [name], MD  rosuvastatin (CRESTOR) 10 MG tablet Take 1 tablet (10  mg total) by mouth at bedtime. 08/26/18   [name], MD      Allergies    Latex    Review of Systems   Review of Systems  Constitutional:  Negative for chills, fatigue and fever.  HENT:  Negative for congestion.   Respiratory:  Negative for cough, chest tightness, shortness of breath, wheezing and stridor.   Cardiovascular:  Negative for chest pain.  Gastrointestinal:  Positive for abdominal pain. Negative for constipation, diarrhea, nausea and vomiting.  Genitourinary:  Positive for dysuria and  hematuria. Negative for flank pain, frequency and urgency.  Musculoskeletal:  Negative for back pain, neck pain and neck stiffness.  Skin:  Negative for rash.  Neurological:  Negative for dizziness and headaches.  All other systems reviewed and are negative.  Physical Exam Updated Vital Signs BP 122/74 (BP Location: Right Arm)    Pulse 84    Temp 98.3 F (36.8 C) (Oral)    Resp 18    Ht 4\' 11"  (1.499 m)    Wt 89.8 kg    SpO2 95%    BMI 39.99 kg/m  Physical Exam Vitals and nursing note reviewed.  Constitutional:      General: She is not in acute distress.    Appearance: She is well-developed. She is not ill-appearing, toxic-appearing or diaphoretic.  HENT:     Head: Normocephalic and atraumatic.  Eyes:     Conjunctiva/sclera: Conjunctivae normal.  Cardiovascular:     Rate and Rhythm: Normal rate and regular rhythm.     Heart sounds: Normal heart sounds. No murmur heard. Pulmonary:     Effort: Pulmonary effort is normal. No respiratory distress.     Breath sounds: Normal breath sounds.  Abdominal:     General: Abdomen is flat. Bowel sounds are normal. There is no distension.     Palpations: Abdomen is soft.     Tenderness: There is abdominal tenderness in the suprapubic area. There is no right CVA tenderness or left CVA tenderness.  Musculoskeletal:        General: No swelling.     Cervical back: Neck supple.  Skin:    General: Skin is warm and dry.     Capillary Refill: Capillary refill takes less than 2 seconds.     Findings: No rash.  Neurological:     General: No focal deficit present.     Mental Status: She is alert.  Psychiatric:        Mood and Affect: Mood normal.    ED Results / Procedures / Treatments   Labs (all labs ordered are listed, but only abnormal results are displayed) Labs Reviewed  URINALYSIS, ROUTINE W REFLEX MICROSCOPIC - Abnormal; Notable for the following components:      Result Value   APPearance CLOUDY (*)    Hgb urine dipstick MODERATE (*)     Protein, ur 100 (*)    Nitrite POSITIVE (*)    Leukocytes,Ua LARGE (*)    All other components within normal limits  URINALYSIS, MICROSCOPIC (REFLEX) - Abnormal; Notable for the following components:   Bacteria, UA MANY (*)    Non Squamous Epithelial PRESENT (*)    All other components within normal limits  COMPREHENSIVE METABOLIC PANEL - Abnormal; Notable for the following components:   Glucose, Bld 179 (*)    All other components within normal limits  URINE CULTURE  CBC WITH DIFFERENTIAL/PLATELET    EKG None  Radiology CT Renal Stone Study  Result Date: 09/28/2021 CLINICAL DATA:  Flank pain EXAM: CT ABDOMEN AND PELVIS WITHOUT CONTRAST TECHNIQUE: Multidetector CT imaging of the abdomen and pelvis was performed following the standard protocol without IV contrast. RADIATION DOSE REDUCTION: This exam was performed according to the departmental dose-optimization program which includes automated exposure control, adjustment of the mA and/or kV according to patient size and/or use of iterative reconstruction technique. COMPARISON:  08/10/2020 FINDINGS: Lower chest: There are small linear densities in the right lower lung fields suggesting scarring or subsegmental atelectasis. Coronary artery calcifications are seen. There is evidence of previous coronary bypass surgery. Hepatobiliary: No focal abnormality is seen in the liver. There is small calcified gallbladder stone. There is no wall thickening in gallbladder. There is no dilation of bile ducts. Pancreas: No focal abnormality is seen. Spleen: Unremarkable. Adrenals/Urinary Tract: Adrenals are not enlarged. There is no hydronephrosis. There are no renal or ureteral stones. Urinary bladder is almost completely empty and not optimally evaluated. Stomach/Bowel: Surgical staples are seen in the stomach. Small bowel loops are not dilated. Appendix is not dilated. Cecum is noted in the subhepatic location. There is no pericecal inflammation. There is  no significant wall thickening in colon. Is no pericolic stranding or fluid collection. Vascular/Lymphatic: Scattered arterial calcifications are seen. Reproductive: Uterus is not seen. Other: There is no ascites or pneumoperitoneum. Small right inguinal hernia containing fat is seen. Musculoskeletal: Degenerative changes are noted in the lower thoracic spine and lumbar spine. IMPRESSION: There is no evidence of intestinal obstruction or pneumoperitoneum. Appendix is not dilated. There is no hydronephrosis. Small calcified gallbladder stone. There are no demonstrable renal or ureteral stones. Coronary artery calcifications are seen. Other findings as described in the body of the report. Electronically Signed   By: Elmer Picker M.D.   On: 09/28/2021 16:37    Procedures Procedures    Medications Ordered in ED Medications  cephALEXin (KEFLEX) capsule 500 mg (has no administration in time range)    ED Course/ Medical Decision Making/ A&P                           Medical Decision Making  Patricia Cuevas is a 71 y.o. female with a past medical history significant for CAD with prior CABG, hypertension, dyslipidemia, diabetes, GERD, previous GI bleed, previous gastric bypass, arthritis, and verbal report of previous kidney stone who presents with dysuria, 10 out of 10 abdominal pain, and hematuria.  According to patient, for the last few days she has been having some dysuria that is burning whenever she urinates.  She also says that today she noticed blood in her urine.  She reports the pain is 10 out of 10 across her lower abdomen but does not go towards her back or flanks.  It is more across the lower abdomen.  She is unsure if she has had significant pain with kidney stones before or not but she think she has had 1.  She denies fevers, chills, nausea, vomiting, constipation, or diarrhea.  No recent trauma reported.   On exam, lower abdomen is slightly tender to palpation.  Normal bowel sounds.   Lungs clear and chest nontender.  Flanks and back nontender.  No CVA tenderness.  Patient had urinalysis performed in triage which does show hematuria, protein, nitrates, leukocytes, and bacteria.  I do suspect she has a UTI causing dysuria however given her verbal report of previous kidney stones, hematuria, and a 10 out of 10 abdominal pain, had discussion with patient and we  feel it is reasonable to get a stone study and some basic labs to make sure she does not have infected stone contributing to her symptoms.  If the CT and labs are reassuring, anticipate discharge with antibiotics for UTI causing her symptoms.  Anticipate reassessment after work-up.   CT scan returned showing no evidence of hydronephrosis or kidney stone.  No evidence of appendicitis.  No evidence of obstruction.  Small gallbladder stone is seen but no evidence of upper abdominal pain.  Suspect urinary tract infection as cause of symptoms.  Went to speak with patient and she agrees.  She will get a dose of Keflex here and will go home with prescription for it.  She will follow-up with PCP and understands return precautions for new or worsened symptoms.  She no questions or concerns and was discharged in good condition with urinary tract infection as the likely cause of her hematuria, dysuria, and suprapubic discomfort.           Final Clinical Impression(s) / ED Diagnoses Final diagnoses:  Acute cystitis with hematuria  Dysuria  Lower abdominal pain    Rx / DC Orders ED Discharge Orders          Ordered    cephALEXin (KEFLEX) 500 MG capsule  2 times daily        09/28/21 1823            Clinical Impression: 1. Acute cystitis with hematuria   2. Dysuria   3. Lower abdominal pain     Disposition: Discharge  Condition: Good  I have discussed the results, Dx and Tx plan with the pt(& family if present). He/she/they expressed understanding and agree(s) with the plan. Discharge instructions  discussed at great length. Strict return precautions discussed and pt &/or family have verbalized understanding of the instructions. No further questions at time of discharge.    New Prescriptions   CEPHALEXIN (KEFLEX) 500 MG CAPSULE    Take 1 capsule (500 mg total) by mouth 2 (two) times daily for 7 days.    Follow Up: [name], [place] [address] [phone]     [facility] [place] EMERGENCY DEPARTMENT [address] [id number] AJ OINO [place] [phone]       Lottie Siska, Gwenyth Allegra, MD 09/28/21 Jeri Lager

## 2021-09-28 NOTE — Discharge Instructions (Signed)
Your history, exam, work-up today are consistent with urinary tract infection causing the burning when you urinate, lower abdominal discomfort, and blood in the urine.  The CT scan did not show any kidney stones or other acute abnormalities aside from the chronic findings we discussed.  Please follow-up with your primary doctor and use the antibiotics.  Please rest and stay hydrated.  If any symptoms change or worsen acutely, please return to the nearest emergency department.

## 2021-10-01 LAB — URINE CULTURE: Culture: >=100000 — AB

## 2021-10-02 ENCOUNTER — Telehealth: Payer: Self-pay | Admitting: *Deleted

## 2021-10-02 NOTE — Telephone Encounter (Signed)
Post ED Visit - Positive Culture Follow-up: Successful Patient Follow-Up  Culture assessed and recommendations reviewed by:  []  Elenor Quinones, Pharm.D. [x]  [name], Pharm.D., BCPS AQ-ID []  Parks Neptune, Pharm.D., BCPS []  Alycia Rossetti, Pharm.D., BCPS []  Wentworth, Pharm.D., BCPS, AAHIVP []  Legrand Como, Pharm.D., BCPS, AAHIVP []  Salome Arnt, PharmD, BCPS []  Johnnette Gourd, PharmD, BCPS []  Hughes Better, PharmD, BCPS []  Leeroy Cha, PharmD  Positive urine culture  []  Patient discharged without antimicrobial prescription and treatment is now indicated [x]  Organism is resistant to prescribed ED discharge antimicrobial []  Patient with positive blood cultures  Changes discussed with ED provider: Suella Broad, PA New antibiotic prescription Stop Cephalexin.  Start Macrobid 100mg  PO BID x 5 days Called to [facility] [address] [phone]  Contacted patient, date 10/02/2021, time [place], [place] 10/02/2021, 10:05 AM

## 2021-10-02 NOTE — Progress Notes (Signed)
ED Antimicrobial Stewardship Positive Culture Follow Up   Patricia Cuevas is an 71 y.o. female who presented to [facility] on 09/28/2021 with a chief complaint of  Chief Complaint  Patient presents with   Abdominal Pain    Recent Results (from the past 720 hour(s))  Urine Culture     Status: Abnormal   Collection Time: 09/28/21  1:42 PM   Specimen: Urine, Clean Catch  Result Value Ref Range Status   Specimen Description   Final    URINE, CLEAN CATCH Performed at [facility], [address] [place], [place] [address]    Special Requests   Final    NONE Performed at [facility], [address] [place], [place] [address]    Culture (A)  Final    >=100,000 COLONIES/mL ESCHERICHIA COLI Confirmed Extended Spectrum Beta-Lactamase Producer (ESBL).  In bloodstream infections from ESBL organisms, carbapenems are preferred over piperacillin/tazobactam. They are shown to have a lower risk of mortality.    Report Status 10/01/2021 FINAL  Final   Organism ID, Bacteria ESCHERICHIA COLI (A)  Final      Susceptibility   Escherichia coli - MIC*    AMPICILLIN >=32 RESISTANT Resistant     CEFAZOLIN >=64 RESISTANT Resistant     CEFEPIME 8 INTERMEDIATE Intermediate     CEFTRIAXONE >=64 RESISTANT Resistant     CIPROFLOXACIN >=4 RESISTANT Resistant     GENTAMICIN >=16 RESISTANT Resistant     IMIPENEM <=0.25 SENSITIVE Sensitive     NITROFURANTOIN <=16 SENSITIVE Sensitive     TRIMETH/SULFA >=320 RESISTANT Resistant     AMPICILLIN/SULBACTAM >=32 RESISTANT Resistant     PIP/TAZO 8 SENSITIVE Sensitive     * >=100,000 COLONIES/mL ESCHERICHIA COLI    [x]  Treated with Cephalexin, organism resistant to prescribed antimicrobial  New antibiotic prescription: Macrobid 100 mg PO BID x 5 days  ED Provider: Suella Broad, PA   Marcene Corning 10/02/2021, 9:53 AM Clinical Pharmacist Monday - Friday phone -  [phone] Saturday - Sunday phone - [phone]

## 2021-10-02 NOTE — Progress Notes (Signed)
ED Antimicrobial Stewardship Positive Culture Follow Up   Patricia Cuevas is an 71 y.o. female who presented to [facility] on 09/28/2021 with a chief complaint of  Chief Complaint  Patient presents with   Abdominal Pain    Recent Results (from the past 720 hour(s))  Urine Culture     Status: Abnormal   Collection Time: 09/28/21  1:42 PM   Specimen: Urine, Clean Catch  Result Value Ref Range Status   Specimen Description   Final    URINE, CLEAN CATCH Performed at [facility], [address] [place], [place] [address]    Special Requests   Final    NONE Performed at [facility], [address] [place], [place] [address]    Culture (A)  Final    >=100,000 COLONIES/mL ESCHERICHIA COLI Confirmed Extended Spectrum Beta-Lactamase Producer (ESBL).  In bloodstream infections from ESBL organisms, carbapenems are preferred over piperacillin/tazobactam. They are shown to have a lower risk of mortality.    Report Status 10/01/2021 FINAL  Final   Organism ID, Bacteria ESCHERICHIA COLI (A)  Final      Susceptibility   Escherichia coli - MIC*    AMPICILLIN >=32 RESISTANT Resistant     CEFAZOLIN >=64 RESISTANT Resistant     CEFEPIME 8 INTERMEDIATE Intermediate     CEFTRIAXONE >=64 RESISTANT Resistant     CIPROFLOXACIN >=4 RESISTANT Resistant     GENTAMICIN >=16 RESISTANT Resistant     IMIPENEM <=0.25 SENSITIVE Sensitive     NITROFURANTOIN <=16 SENSITIVE Sensitive     TRIMETH/SULFA >=320 RESISTANT Resistant     AMPICILLIN/SULBACTAM >=32 RESISTANT Resistant     PIP/TAZO 8 SENSITIVE Sensitive     * >=100,000 COLONIES/mL ESCHERICHIA COLI    [x]  Treated with Keflex, organism resistant to prescribed antimicrobial  New antibiotic prescription: Stop cephalexin. Start fosfomycin 3g PO x 1 dose.  ED Provider: Carlisle Cater, PA-C   Margretta Sidle Dohlen 10/02/2021, 7:52 AM Clinical Pharmacist Monday - Friday phone -  [phone] Saturday - Sunday phone -  [phone]

## 2021-10-20 ENCOUNTER — Other Ambulatory Visit (HOSPITAL_BASED_OUTPATIENT_CLINIC_OR_DEPARTMENT_OTHER): Payer: Self-pay

## 2021-10-20 ENCOUNTER — Emergency Department (HOSPITAL_BASED_OUTPATIENT_CLINIC_OR_DEPARTMENT_OTHER)
Admission: EM | Admit: 2021-10-20 | Discharge: 2021-10-20 | Disposition: A | Discharge status: Home / Self Care | Payer: Medicare Other | Attending: Emergency Medicine | Admitting: Emergency Medicine

## 2021-10-20 ENCOUNTER — Encounter (HOSPITAL_BASED_OUTPATIENT_CLINIC_OR_DEPARTMENT_OTHER): Payer: Self-pay

## 2021-10-20 ENCOUNTER — Emergency Department (HOSPITAL_BASED_OUTPATIENT_CLINIC_OR_DEPARTMENT_OTHER): Payer: Medicare Other

## 2021-10-20 ENCOUNTER — Other Ambulatory Visit: Payer: Self-pay

## 2021-10-20 DIAGNOSIS — U071 COVID-19: Secondary | ICD-10-CM

## 2021-10-20 DIAGNOSIS — Q791 Other congenital malformations of diaphragm: Secondary | ICD-10-CM | POA: Insufficient documentation

## 2021-10-20 DIAGNOSIS — Z91040 Latex allergy status: Secondary | ICD-10-CM | POA: Insufficient documentation

## 2021-10-20 DIAGNOSIS — Z7982 Long term (current) use of aspirin: Secondary | ICD-10-CM | POA: Insufficient documentation

## 2021-10-20 DIAGNOSIS — R059 Cough, unspecified: Secondary | ICD-10-CM | POA: Diagnosis present

## 2021-10-20 LAB — RESP PANEL BY RT-PCR (FLU A&B, COVID) ARPGX2
Influenza A by PCR: NEGATIVE
Influenza B by PCR: NEGATIVE
SARS Coronavirus 2 by RT PCR: POSITIVE — AB

## 2021-10-20 MED ORDER — NIRMATRELVIR/RITONAVIR (PAXLOVID)TABLET
3.0000 | ORAL_TABLET | Freq: Two times a day (BID) | ORAL | 0 refills | Status: AC
Start: 2021-10-20 — End: 2021-10-25
  Filled 2021-10-20: qty 30, 5d supply, fill #0

## 2021-10-20 NOTE — ED Notes (Signed)
Pt discharged to home. Discharge instructions have been discussed with patient and/or family members. Pt verbally acknowledges understanding d/c instructions, and endorses comprehension to checkout at registration before leaving.  °

## 2021-10-20 NOTE — ED Provider Notes (Signed)
[facility] EMERGENCY DEPARTMENT Provider Note   CSN: [id number] Arrival date & time: 10/20/21  0935     History  Chief Complaint  Patient presents with   Cough    Patricia Cuevas is a 71 y.o. female.  Pt complains of a cough since Friday.    The history is provided by the patient. No language interpreter was used.  Cough Cough characteristics:  Non-productive Sputum characteristics:  Nondescript Severity:  Moderate Onset quality:  Gradual Timing:  Constant Progression:  Worsening Chronicity:  New Smoker: no   Context: sick contacts   Relieved by:  Nothing Worsened by:  Nothing Ineffective treatments:  None tried Associated symptoms: no fever   Risk factors: no recent infection       Home Medications Prior to Admission medications   Medication Sig Start Date End Date Taking? Authorizing Provider  albuterol (PROVENTIL HFA;VENTOLIN HFA) 108 (90 BASE) MCG/ACT inhaler Inhale 2 puffs into the lungs every 6 (six) hours as needed for shortness of breath.    [provider]  aspirin EC 81 MG tablet Take 81 mg by mouth daily.     [provider]  benzonatate (TESSALON) 100 MG capsule Take 1 capsule (100 mg total) by mouth every 8 (eight) hours. 07/30/21   Palumbo, April, MD  cetirizine (ZYRTEC) 10 MG tablet Take 1 tablet (10 mg total) by mouth daily. Patient taking differently: Take 10 mg by mouth daily as needed for allergies. 08/16/17   Maczis, Barth Kirks, PA-C  cholecalciferol (VITAMIN D3) 25 MCG (1000 UT) tablet Take 1,000 Units by mouth daily.    [provider]  diclofenac Sodium (VOLTAREN) 1 % GEL Apply 4 g topically 4 (four) times daily. 05/01/21   Deno Etienne, DO  fluticasone (FLONASE) 50 MCG/ACT nasal spray Place 2 sprays into both nostrils daily. Patient taking differently: Place 2 sprays into both nostrils daily as needed for allergies. 08/16/17   Maczis, Barth Kirks, PA-C  glipiZIDE (GLUCOTROL) 10 MG tablet Take 10 mg by mouth every  morning. 12/06/20   [provider]  HYDROcodone-acetaminophen (NORCO) 10-325 MG tablet Take 1 tablet by mouth 2 (two) times daily as needed.    [provider]  latanoprost (XALATAN) 0.005 % ophthalmic solution Place 1 drop into both eyes at bedtime as needed (dry eyes).    [provider]  lisinopril-hydrochlorothiazide (PRINZIDE,ZESTORETIC) 20-12.5 MG per tablet Take 1 tablet by mouth daily.    [provider]  magnesium oxide (MAG-OX) 400 MG tablet Take 400 mg by mouth daily as needed (leg cramps). 01/13/21   [provider]  metoprolol tartrate (LOPRESSOR) 25 MG tablet TAKE 1 TABLET BY MOUTH TWICE DAILY 09/30/21   [name], MD  montelukast (SINGULAIR) 10 MG tablet Take 10 mg by mouth at bedtime. 08/14/19   [provider]  nitroGLYCERIN (NITROSTAT) 0.4 MG SL tablet Place 1 tablet (0.4 mg total) under the tongue every 5 (five) minutes as needed for chest pain. 07/22/21   Loel Dubonnet, NP  phenazopyridine (PYRIDIUM) 200 MG tablet Take 1 tablet (200 mg total) by mouth 3 (three) times daily as needed for pain. 08/10/20   [name], MD  rosuvastatin (CRESTOR) 10 MG tablet Take 1 tablet (10 mg total) by mouth at bedtime. 08/26/18   [name], MD      Allergies    Latex    Review of Systems   Review of Systems  Constitutional:  Negative for fever.  Respiratory:  Positive  for cough.   All other systems reviewed and are negative.  Physical Exam Updated Vital Signs BP 136/74 (BP Location: Right Arm)    Pulse 70    Temp 99.2 F (37.3 C) (Oral)    Resp 20    Ht 4\' 11"  (1.499 m)    Wt 91.6 kg    SpO2 96%    BMI 40.80 kg/m  Physical Exam Vitals and nursing note reviewed.  Constitutional:      Appearance: She is well-developed.  HENT:     Head: Normocephalic.     Mouth/Throat:     Mouth: Mucous membranes are moist.  Eyes:     Pupils: Pupils are equal, round, and reactive to light.  Cardiovascular:     Rate and Rhythm:  Normal rate.  Pulmonary:     Effort: Pulmonary effort is normal.  Abdominal:     General: There is no distension.  Musculoskeletal:        General: Normal range of motion.     Cervical back: Normal range of motion.  Skin:    General: Skin is warm.  Neurological:     General: No focal deficit present.     Mental Status: She is alert and oriented to person, place, and time.    ED Results / Procedures / Treatments   Labs (all labs ordered are listed, but only abnormal results are displayed) Labs Reviewed  RESP PANEL BY RT-PCR (FLU A&B, COVID) ARPGX2 - Abnormal; Notable for the following components:      Result Value   SARS Coronavirus 2 by RT PCR POSITIVE (*)    All other components within normal limits    EKG None  Radiology DG Chest Portable 1 View  Result Date: 10/20/2021 CLINICAL DATA:  Cough and wheezing beginning yesterday. EXAM: PORTABLE CHEST 1 VIEW COMPARISON:  08/25/2021 FINDINGS: The heart size and mediastinal contours are within normal limits. Prior CABG again noted. Marked elevation of right hemidiaphragm is stable. Both lungs are clear. Surgical clips again seen in the left axilla. IMPRESSION: Stable elevation of right hemidiaphragm. No active disease. Electronically Signed   By: Marlaine Hind M.D.   On: 10/20/2021 10:07    Procedures Procedures    Medications Ordered in ED Medications - No data to display  ED Course/ Medical Decision Making/ A&P                           Medical Decision Making Pt has had a cough for 4 days.  Pt denies covid orflu exposure   Problems Addressed: COVID: acute illness or injury    Details: postive covid test today  Amount and/or Complexity of Data Reviewed External Data Reviewed: labs.    Details: I reviewed labs from January.  Pt had a normal BUn and creatine Radiology: ordered and independent interpretation performed. Decision-making details documented in ED Course.    Details: elevated diaphram  no pneumonia  . Discussion of management or test interpretation with external provider(s): Pharmacy consulted to review medications.  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  Risk OTC drugs. Prescription drug management. Risk Details: Pt's 02 sats are normal  Pt nontoxic appearing,  pneumonia ruled out            Final Clinical Impression(s) / ED Diagnoses Final diagnoses:  COVID    Rx / DC Orders ED Discharge Orders          Ordered    nirmatrelvir/ritonavir EUA (  PAXLOVID) 20 x 150 MG & 10 x 100MG  TABS  2 times daily        10/20/21 1213           An After Visit Summary was printed and given to the patient.  An After Visit Summary was printed and given to the patient.    Fransico Meadow, Hershal Coria 10/20/21 1220    Fredia Sorrow, MD 10/22/21 [phone]

## 2021-10-20 NOTE — Discharge Instructions (Addendum)
Do not take your cholesterol medication while on the covid medication>  Tyleno for fever and bodyaches

## 2021-10-20 NOTE — ED Triage Notes (Signed)
Pt arrives ambulatory to ED with reports of a cough starting yesterday, denies testing at home for covid. Cough is productive with yellow sputum.

## 2021-12-11 ENCOUNTER — Telehealth: Payer: Self-pay | Admitting: *Deleted

## 2021-12-11 NOTE — Telephone Encounter (Signed)
Office returning call to Patricia Cuevas. They explained that pt is getting a cleaning and extraction of the teeth 29,30, and 28. She reported that pt has the option of these extractions or root canal  ?

## 2021-12-11 NOTE — Telephone Encounter (Signed)
? ?  Pre-operative Risk Assessment  ?  ?Patient Name: [name]  ?DOB: Jul 23, 1951 ?MRN: [id number]  ? ? ? ?Request for Surgical Clearance   ? ?Procedure:   3 TEETH EXTRACTED , CLEANING; PT MAY OPT FOR ROOT CANALS THOUGH  ? ?Date of Surgery:  Clearance TBD                              ?   ?Surgeon:  DR. Lytle Butte ?Surgeon's Group or Practice Name:  [facility] ?Phone number:  [phone] ?Fax number:  [phone] ?  ?Type of Clearance Requested:   ?- Medical  PER DDS OFFICE THE PT WILL NOT NEED TO HOLD ASA ?  ?Type of Anesthesia:  Local  ?  ?Additional requests/questions:   ? ?Signed, ?Julaine Hua   ?12/11/2021, 4:50 PM  ? ?

## 2021-12-11 NOTE — Telephone Encounter (Signed)
Dental office did call back though I was not available to s/w them at the moment. Left message for DDS office that I did some of the clarification. However, we do need to know what type of anesthesia will be used as well does the DDS want the ASA to be held. It is generally up to the DDS if they want meds to be held. Cardiologist can give recommendations as to hold time.  ?

## 2021-12-11 NOTE — Telephone Encounter (Signed)
Left message for dental office to call with clarification of procedure. Once I have clarification I will be happy to forward to our pre op provider.  ?

## 2021-12-12 ENCOUNTER — Telehealth: Payer: Self-pay | Admitting: *Deleted

## 2021-12-12 ENCOUNTER — Ambulatory Visit (INDEPENDENT_AMBULATORY_CARE_PROVIDER_SITE_OTHER): Payer: Medicare Other | Admitting: General Practice

## 2021-12-12 DIAGNOSIS — Z01810 Encounter for preprocedural cardiovascular examination: Secondary | ICD-10-CM

## 2021-12-12 NOTE — Telephone Encounter (Signed)
Pt is agreeable to plan of care for tele pre op appt 12/12/21 @ 3 pm. Med rec and consent are done. I wished the pt a Happy Patricia Cuevas as her [birth year] is [birth year]. ?

## 2021-12-12 NOTE — Progress Notes (Signed)
? ?Virtual Visit via Telephone Note  ? ?This visit type was conducted due to national recommendations for restrictions regarding the COVID-19 Pandemic (e.g. social distancing) in an effort to limit this patient's exposure and mitigate transmission in our community.  Due to her co-morbid illnesses, this patient is at least at moderate risk for complications without adequate follow up.  This format is felt to be most appropriate for this patient at this time.  The patient did not have access to video technology/had technical difficulties with video requiring transitioning to audio format only (telephone).  All issues noted in this document were discussed and addressed.  No physical exam could be performed with this format.  Please refer to the patient's chart for her  consent to telehealth for [facility]. ? ?Evaluation Performed:  Preoperative cardiovascular risk assessment ?_____________  ? ?Date:  12/12/2021  ? ?Patient ID:  Patricia Cuevas, DOB Jul 27, 1951, MRN [id number] ?Patient Location:  ?Home ?Provider location:   ?Office ? ?Primary Care Provider:  [name], MD ?Primary Cardiologist:  Patricia Furbish, MD ? ?Chief Complaint  ?  ?71 y.o. y/o female with a h/o alcohol abuse, asthma, coronary artery disease, chronic back Cuevas, diabetes, who is pending 3 teeth to be extracted, dental cleaning, and presents today for telephonic preoperative cardiovascular risk assessment. ? ?Past Medical History  ?  ?Past Medical History:  ?Diagnosis Date  ? Alcohol abuse   ? in the past, resolved  ? Arthritis   ? Asthma   ? Blood transfusion without reported diagnosis 1984  ? after surgery  ? Breast cancer ([place])   ? Double mastectomy 2007; s/p tamoxifen and arimidex therapy; no recurrence since 05/2008  ? CAD (coronary artery disease)   ? Nuclear stress test Feb 11, 2011, EF 64%, no scar or ischemia    //         catheterization, November, 2011,patent LIMA-LAD-small caliber distal vessel with diffuse plaque, patent SVG to OM1 and  OM 2, patent SVG to PDA and PLA, occluded SVG to diagonal, medical therapy;  Lexiscan Myoview 6/14:  Inferior thinning, no ischemia, EF 61%, low risk  ? CHF (congestive heart failure) ([place])   ? Chronic back Cuevas   ? Diabetes mellitus   ? Dyslipidemia   ? Ejection fraction   ? EF 65%, echo, [place], Jan 16, 2011  ? Hx of CABG   ? 2007  ? Hx of colonic polyps   ? Hyperlipidemia   ? Hypertension   ? Myocardial infarction [facility])   ? Nausea & vomiting   ? hospitalization, November, 2011, stricture GE junction, questionable stenosis gastrojejunostomy, disruptive primary peristaltic wave, GE reflux, delayed emptying proximal gastric pouch  ? Normal cardiac stress test 02/2013  ? Overweight(278.02)   ? stomach stapling 1985 followed by weight loss, then return of weight  ? Sciatica   ? Sleep apnea   ? CPAP being arranged May, 2011//does not use CPAP  ? Tobacco abuse   ? in the past, resolved  ? UTI (urinary tract infection)   ? ?Past Surgical History:  ?Procedure Laterality Date  ? ABDOMINAL HYSTERECTOMY  1989  ? CARDIAC CATHETERIZATION N/A 12/20/2015  ? Procedure: Left Heart Cath and Cors/Grafts Angiography;  Surgeon: Patricia Man, MD;  Location: [facility];  Service: Cardiovascular;  Laterality: N/A;  ? CORONARY ARTERY BYPASS GRAFT  2009  ? CORONARY STENT PLACEMENT    ? GASTRIC BYPASS  1983  ? open.  [place]  ? MASTECTOMY Bilateral  1987  ? ? ?Allergies ? ?Allergies  ?Allergen Reactions  ? Latex Itching and Rash  ? ? ?History of Present Illness  ?  ?Patricia Cuevas is a 71 y.o. female who presents via audio/video conferencing for a telehealth visit today.  Pt was last seen in cardiology clinic on 07/22/21 by Patricia Montana, NP-C.  At that time Patricia Cuevas was doing well .  The patient is now pending 3 teeth extraction and dental cleaning.  Since her last visit, she remained stable from a cardiac standpoint. ? ?Today she denies chest Cuevas, shortness of breath, lower extremity edema, fatigue, palpitations,  melena, hematuria, hemoptysis, diaphoresis, weakness, presyncope, syncope, orthopnea, and PND. ? ? ? ?Home Medications  ?  ?Prior to Admission medications   ?Medication Sig Start Date End Date Taking? Authorizing Provider  ?albuterol (PROVENTIL HFA;VENTOLIN HFA) 108 (90 BASE) MCG/ACT inhaler Inhale 2 puffs into the lungs every 6 (six) hours as needed for shortness of breath.    [provider]  ?aspirin EC 81 MG tablet Take 81 mg by mouth daily.     [provider]  ?benzonatate (TESSALON) 100 MG capsule Take 1 capsule (100 mg total) by mouth every 8 (eight) hours. 07/30/21   Patricia Cuevas, April, MD  ?cetirizine (ZYRTEC) 10 MG tablet Take 1 tablet (10 mg total) by mouth daily. ?Patient taking differently: Take 10 mg by mouth daily as needed for allergies. 08/16/17   Patricia Cuevas, Patricia Kirks, PA-C  ?cholecalciferol (VITAMIN D3) 25 MCG (1000 UT) tablet Take 1,000 Units by mouth daily.    [provider]  ?diclofenac Sodium (VOLTAREN) 1 % GEL Apply 4 g topically 4 (four) times daily. 05/01/21   Patricia Etienne, Patricia Cuevas  ?fluticasone (FLONASE) 50 MCG/ACT nasal spray Place 2 sprays into both nostrils daily. ?Patient taking differently: Place 2 sprays into both nostrils daily as needed for allergies. 08/16/17   Patricia Cuevas, Patricia Kirks, PA-C  ?glipiZIDE (GLUCOTROL) 10 MG tablet Take 10 mg by mouth every morning. 12/06/20   [provider]  ?HYDROcodone-acetaminophen (NORCO) 10-325 MG tablet Take 1 tablet by mouth 2 (two) times daily as needed.    [provider]  ?latanoprost (XALATAN) 0.005 % ophthalmic solution Place 1 drop into both eyes at bedtime as needed (dry eyes).    [provider]  ?lisinopril-hydrochlorothiazide (PRINZIDE,ZESTORETIC) 20-12.5 MG per tablet Take 1 tablet by mouth daily.    [provider]  ?magnesium oxide (MAG-OX) 400 MG tablet Take 400 mg by mouth daily as needed (leg cramps). 01/13/21   [provider]  ?metoprolol tartrate (LOPRESSOR) 25 MG tablet TAKE 1  TABLET BY MOUTH TWICE DAILY 09/30/21   Patricia Pain, MD  ?montelukast (SINGULAIR) 10 MG tablet Take 10 mg by mouth at bedtime. 08/14/19   [provider]  ?nitroGLYCERIN (NITROSTAT) 0.4 MG SL tablet Place 1 tablet (0.4 mg total) under the tongue every 5 (five) minutes as needed for chest Cuevas. 07/22/21   Loel Dubonnet, NP  ?phenazopyridine (PYRIDIUM) 200 MG tablet Take 1 tablet (200 mg total) by mouth 3 (three) times daily as needed for Cuevas. 08/10/20   [name], MD  ?rosuvastatin (CRESTOR) 10 MG tablet Take 1 tablet (10 mg total) by mouth at bedtime. 08/26/18   Patricia Pain, MD  ? ? ?Physical Exam  ?  ?Vital Signs:  JOELLY BOLANOS does not have vital signs available for review today. ? ?Given telephonic nature of communication, physical exam is limited. ?AAOx3. NAD. Normal affect.  Speech  and respirations are unlabored. ? ?Accessory Clinical Findings  ?  ?None ? ?Assessment & Plan  ?  ?1.  Preoperative Cardiovascular Risk Assessment: ? ?  ? ?Primary Cardiologist: Patricia Furbish, MD ? ?Chart reviewed as part of pre-operative protocol coverage. Given past medical history and time since last visit, based on ACC/AHA guidelines, AZRIELLE SPRINGSTEEN would be at acceptable risk for the planned procedure without further cardiovascular testing.  ? ?Patient was advised that if she develops new symptoms prior to surgery to contact our office to arrange a follow-up appointment.  He verbalized understanding. ? ?Patient does not require SBE prophylaxis prior to her dental extractions or cleaning ? ?A copy of this note will be routed to requesting surgeon. ? ?Time:   ?Today, I have spent 5 minutes with the patient with telehealth technology discussing medical history, symptoms, and management plan.  Prior to her phone evaluation I spent greater than 10 minutes reviewing patient's past medical history and medications. ? ? ?Deberah Pelton, NP ? ?12/12/2021, 9:52 AM ? ?

## 2021-12-12 NOTE — Telephone Encounter (Signed)
Patient has not requested to hold ASA. ? ?Preoperative team, please contact this patient and set up a phone call appointment for further cardiac evaluation.  Thank you for your help. ? ?Jossie Ng. Madolyn Ackroyd NP-C ? ?  ?12/12/2021, 6:58 AM ?Tea ?[address] ?Office [phone] Fax [phone] ? ?

## 2021-12-12 NOTE — Telephone Encounter (Signed)
Pt is agreeable to plan of care for tele pre op appt 12/12/21 @ 3 pm. Med rec and consent are done. I wished the pt a Happy Birthday as her [birth year] is [birth year]. ? ?  ?Patient Consent for Virtual Visit  ? ? ?   ? ?[name] has provided verbal consent on 12/12/2021 for a virtual visit (video or telephone). ? ? ?CONSENT FOR VIRTUAL VISIT FOR:  Patricia Cuevas  ?By participating in this virtual visit I agree to the following: ? ?I hereby voluntarily request, consent and authorize [facility] and its employed or contracted physicians, physician assistants, nurse practitioners or other licensed health care professionals (the Practitioner), to provide me with telemedicine health care services (the ?Services") as deemed necessary by the treating Practitioner. I acknowledge and consent to receive the Services by the Practitioner via telemedicine. I understand that the telemedicine visit will involve communicating with the Practitioner through live audiovisual communication technology and the disclosure of certain medical information by electronic transmission. I acknowledge that I have been given the opportunity to request an in-person assessment or other available alternative prior to the telemedicine visit and am voluntarily participating in the telemedicine visit. ? ?I understand that I have the right to withhold or withdraw my consent to the use of telemedicine in the course of my care at any time, without affecting my right to future care or treatment, and that the Practitioner or I may terminate the telemedicine visit at any time. I understand that I have the right to inspect all information obtained and/or recorded in the course of the telemedicine visit and may receive copies of available information for a reasonable fee.  I understand that some of the potential risks of receiving the Services via telemedicine include:  ?Delay or interruption in medical evaluation due to technological equipment failure or  disruption; ?Information transmitted may not be sufficient (e.g. poor resolution of images) to allow for appropriate medical decision making by the Practitioner; and/or  ?In rare instances, security protocols could fail, causing a breach of personal health information. ? ?Furthermore, I acknowledge that it is my responsibility to provide information about my medical history, conditions and care that is complete and accurate to the best of my ability. I acknowledge that Practitioner's advice, recommendations, and/or decision may be based on factors not within their control, such as incomplete or inaccurate data provided by me or distortions of diagnostic images or specimens that may result from electronic transmissions. I understand that the practice of medicine is not an exact science and that Practitioner makes no warranties or guarantees regarding treatment outcomes. I acknowledge that a copy of this consent can be made available to me via my patient portal ([facility]), or I can request a printed copy by calling the office of [facility].   ? ?I understand that my insurance will be billed for this visit.  ? ?I have read or had this consent read to me. ?I understand the contents of this consent, which adequately explains the benefits and risks of the Services being provided via telemedicine.  ?I have been provided ample opportunity to ask questions regarding this consent and the Services and have had my questions answered to my satisfaction. ?I give my informed consent for the services to be provided through the use of telemedicine in my medical care ? ? ? ?

## 2022-06-06 ENCOUNTER — Encounter (HOSPITAL_BASED_OUTPATIENT_CLINIC_OR_DEPARTMENT_OTHER): Payer: Self-pay | Admitting: Emergency Medicine

## 2022-06-06 ENCOUNTER — Emergency Department (HOSPITAL_BASED_OUTPATIENT_CLINIC_OR_DEPARTMENT_OTHER)
Admission: EM | Admit: 2022-06-06 | Discharge: 2022-06-06 | Disposition: A | Discharge status: Home / Self Care | Payer: Medicare Other | Attending: Emergency Medicine | Admitting: Emergency Medicine

## 2022-06-06 ENCOUNTER — Emergency Department (HOSPITAL_BASED_OUTPATIENT_CLINIC_OR_DEPARTMENT_OTHER): Payer: Medicare Other

## 2022-06-06 ENCOUNTER — Other Ambulatory Visit: Payer: Self-pay

## 2022-06-06 DIAGNOSIS — I110 Hypertensive heart disease with heart failure: Secondary | ICD-10-CM | POA: Diagnosis not present

## 2022-06-06 DIAGNOSIS — Z951 Presence of aortocoronary bypass graft: Secondary | ICD-10-CM | POA: Insufficient documentation

## 2022-06-06 DIAGNOSIS — Z853 Personal history of malignant neoplasm of breast: Secondary | ICD-10-CM | POA: Insufficient documentation

## 2022-06-06 DIAGNOSIS — I2510 Atherosclerotic heart disease of native coronary artery without angina pectoris: Secondary | ICD-10-CM | POA: Diagnosis not present

## 2022-06-06 DIAGNOSIS — J45909 Unspecified asthma, uncomplicated: Secondary | ICD-10-CM | POA: Diagnosis not present

## 2022-06-06 DIAGNOSIS — Z7951 Long term (current) use of inhaled steroids: Secondary | ICD-10-CM | POA: Diagnosis not present

## 2022-06-06 DIAGNOSIS — I509 Heart failure, unspecified: Secondary | ICD-10-CM | POA: Diagnosis not present

## 2022-06-06 DIAGNOSIS — E119 Type 2 diabetes mellitus without complications: Secondary | ICD-10-CM | POA: Diagnosis not present

## 2022-06-06 DIAGNOSIS — J209 Acute bronchitis, unspecified: Secondary | ICD-10-CM | POA: Diagnosis not present

## 2022-06-06 DIAGNOSIS — Z7984 Long term (current) use of oral hypoglycemic drugs: Secondary | ICD-10-CM | POA: Insufficient documentation

## 2022-06-06 DIAGNOSIS — Z7982 Long term (current) use of aspirin: Secondary | ICD-10-CM | POA: Insufficient documentation

## 2022-06-06 DIAGNOSIS — Z91040 Latex allergy status: Secondary | ICD-10-CM | POA: Diagnosis not present

## 2022-06-06 DIAGNOSIS — Z20822 Contact with and (suspected) exposure to covid-19: Secondary | ICD-10-CM | POA: Diagnosis not present

## 2022-06-06 DIAGNOSIS — Z87891 Personal history of nicotine dependence: Secondary | ICD-10-CM | POA: Insufficient documentation

## 2022-06-06 DIAGNOSIS — Z79899 Other long term (current) drug therapy: Secondary | ICD-10-CM | POA: Diagnosis not present

## 2022-06-06 DIAGNOSIS — R0602 Shortness of breath: Secondary | ICD-10-CM | POA: Diagnosis present

## 2022-06-06 LAB — URINALYSIS, ROUTINE W REFLEX MICROSCOPIC
Bilirubin Urine: NEGATIVE
Glucose, UA: NEGATIVE mg/dL
Hgb urine dipstick: NEGATIVE
Ketones, ur: NEGATIVE mg/dL
Leukocytes,Ua: NEGATIVE
Nitrite: NEGATIVE
Protein, ur: NEGATIVE mg/dL
Specific Gravity, Urine: 1.025 (ref 1.005–1.030)
pH: 5.5 (ref 5.0–8.0)

## 2022-06-06 LAB — CBC WITH DIFFERENTIAL/PLATELET
Abs Immature Granulocytes: 0.01 10*3/uL (ref 0.00–0.07)
Basophils Absolute: 0 10*3/uL (ref 0.0–0.1)
Basophils Relative: 0 %
Eosinophils Absolute: 0 10*3/uL (ref 0.0–0.5)
Eosinophils Relative: 1 %
HCT: 39.4 % (ref 36.0–46.0)
Hemoglobin: 12.6 g/dL (ref 12.0–15.0)
Immature Granulocytes: 0 %
Lymphocytes Relative: 51 %
Lymphs Abs: 3.9 10*3/uL (ref 0.7–4.0)
MCH: 28.1 pg (ref 26.0–34.0)
MCHC: 32 g/dL (ref 30.0–36.0)
MCV: 87.9 fL (ref 80.0–100.0)
Monocytes Absolute: 0.7 10*3/uL (ref 0.1–1.0)
Monocytes Relative: 9 %
Neutro Abs: 2.9 10*3/uL (ref 1.7–7.7)
Neutrophils Relative %: 39 %
Platelets: 171 10*3/uL (ref 150–400)
RBC: 4.48 MIL/uL (ref 3.87–5.11)
RDW: 14.3 % (ref 11.5–15.5)
WBC: 7.5 10*3/uL (ref 4.0–10.5)
nRBC: 0 % (ref 0.0–0.2)

## 2022-06-06 LAB — BASIC METABOLIC PANEL
Anion gap: 5 (ref 5–15)
BUN: 13 mg/dL (ref 8–23)
CO2: 28 mmol/L (ref 22–32)
Calcium: 8.9 mg/dL (ref 8.9–10.3)
Chloride: 105 mmol/L (ref 98–111)
Creatinine, Ser: 0.77 mg/dL (ref 0.44–1.00)
GFR, Estimated: >60 mL/min (ref >60)
Glucose, Bld: 127 mg/dL — ABNORMAL HIGH (ref 70–99)
Potassium: 3.7 mmol/L (ref 3.5–5.1)
Sodium: 138 mmol/L (ref 135–145)

## 2022-06-06 LAB — SARS CORONAVIRUS 2 BY RT PCR: SARS Coronavirus 2 by RT PCR: NEGATIVE

## 2022-06-06 MED ORDER — ALBUTEROL SULFATE HFA 108 (90 BASE) MCG/ACT IN AERS
2.0000 | INHALATION_SPRAY | RESPIRATORY_TRACT | Status: DC | PRN
  Administered 2022-06-06: 2 via RESPIRATORY_TRACT
  Filled 2022-06-06: qty 6.7

## 2022-06-06 NOTE — ED Triage Notes (Signed)
Pt reports that for past 2 days she has had weakness, SOB, and a productive cough with yellow sputum. Denies fevers, N/V/D. Reports her sister had neg COVID yesterday, but was caring for a child that tested positive for COVID the day before.

## 2022-06-06 NOTE — ED Provider Notes (Signed)
[facility] Provider Note: Georgena Spurling, MD, FACEP  CSN: [id number] MRN: [id number] ARRIVAL: 06/06/22 at [place]: [place]  06/06/22 5:04 AM [name] is a 71 y.o. female with multiple medical problems.  She is here with 2 days of weakness, body aches, shortness of breath and cough productive of yellow sputum.  She has not had a fever.  She has not had nausea, vomiting or diarrhea.  She has had exposure to a child with COVID recently.  She also had COVID in February of this year.  She is out of her albuterol inhaler.   Past Medical History:  Diagnosis Date   Alcohol abuse    in the past, resolved   Arthritis    Asthma    Breast cancer ([place] [facility])    Double mastectomy 2007; s/p tamoxifen and arimidex therapy; no recurrence since 05/2008   CAD (coronary artery disease)    Nuclear stress test Feb 11, 2011, EF 64%, no scar or ischemia    //         catheterization, November, 2011,patent LIMA-LAD-small caliber distal vessel with diffuse plaque, patent SVG to OM1 and OM 2, patent SVG to PDA and PLA, occluded SVG to diagonal, medical therapy;  [facility] 6/14:  Inferior thinning, no ischemia, EF 61%, low risk   CHF (congestive heart failure) (HCC)    Chronic back pain    Diabetes mellitus    Hx of colonic polyps    Hyperlipidemia    Hypertension    Myocardial infarction (HCC)    Nausea & vomiting    hospitalization, November, 2011, stricture GE junction, questionable stenosis gastrojejunostomy, disruptive primary peristaltic wave, GE reflux, delayed emptying proximal gastric pouch   Overweight(278.02)    stomach stapling 1985 followed by weight loss, then return of weight   Sciatica    Sleep apnea    CPAP being arranged May, 2011//does not use CPAP   Tobacco abuse    in the past, resolved   UTI (urinary tract infection)     Past Surgical History:  Procedure Laterality Date   [place] N/A 12/20/2015   Procedure: Left Heart Cath and Cors/Grafts Angiography;  Surgeon: Leonie Man, MD;  Location: [facility];  Service: Cardiovascular;  Laterality: N/A;   CORONARY ARTERY BYPASS GRAFT  2009   CORONARY STENT PLACEMENT     GASTRIC BYPASS  1983   open.  [facility]   MASTECTOMY Bilateral 1987    Family History  Problem Relation Age of Onset   Hypertension Mother    Heart disease Mother    Diabetes Mother    Heart disease Father    Colon cancer Father        does not know age of onset   Heart disease Sister    Colon cancer Sister 89   Stomach cancer Sister    Diabetes Sister    Asthma Sister    Diabetes Sister    Esophageal cancer Neg Hx    Rectal cancer Neg Hx     Social History   Tobacco Use   Smoking status: Former    Packs/day: 3.00    Years: 27.00    Total pack years: 81.00    Types: Cigarettes    Quit date: 09/14/2000    Years since quitting: 21.7   Smokeless tobacco: Never  Vaping Use  Vaping Use: Never used  Substance Use Topics   Alcohol use: No   Drug use: No    Prior to Admission medications   Medication Sig Start Date End Date Taking? Authorizing Provider  albuterol (PROVENTIL HFA;VENTOLIN HFA) 108 (90 BASE) MCG/ACT inhaler Inhale 2 puffs into the lungs every 6 (six) hours as needed for shortness of breath.    [provider]  aspirin EC 81 MG tablet Take 81 mg by mouth daily.     [provider]  benzonatate (TESSALON) 100 MG capsule Take 1 capsule (100 mg total) by mouth every 8 (eight) hours. 07/30/21   Palumbo, April, MD  cetirizine (ZYRTEC) 10 MG tablet Take 1 tablet (10 mg total) by mouth daily. Patient taking differently: Take 10 mg by mouth daily as needed for allergies. 08/16/17   Maczis, Barth Kirks, PA-C  cholecalciferol (VITAMIN D3) 25 MCG (1000 UT) tablet Take 1,000 Units by mouth daily.    [provider]  diclofenac Sodium (VOLTAREN) 1 % GEL Apply 4 g  topically 4 (four) times daily. 05/01/21   Deno Etienne, DO  fluticasone (FLONASE) 50 MCG/ACT nasal spray Place 2 sprays into both nostrils daily. Patient taking differently: Place 2 sprays into both nostrils daily as needed for allergies. 08/16/17   Maczis, Barth Kirks, PA-C  glipiZIDE (GLUCOTROL) 10 MG tablet Take 10 mg by mouth every morning. 12/06/20   [provider]  HYDROcodone-acetaminophen (NORCO) 10-325 MG tablet Take 1 tablet by mouth 2 (two) times daily as needed.    [provider]  latanoprost (XALATAN) 0.005 % ophthalmic solution Place 1 drop into both eyes at bedtime as needed (dry eyes).    [provider]  lisinopril-hydrochlorothiazide (PRINZIDE,ZESTORETIC) 20-12.5 MG per tablet Take 1 tablet by mouth daily.    [provider]  magnesium oxide (MAG-OX) 400 MG tablet Take 400 mg by mouth daily as needed (leg cramps). 01/13/21   [provider]  metoprolol tartrate (LOPRESSOR) 25 MG tablet TAKE 1 TABLET BY MOUTH TWICE DAILY 09/30/21   [name], MD  montelukast (SINGULAIR) 10 MG tablet Take 10 mg by mouth at bedtime. 08/14/19   [provider]  nitroGLYCERIN (NITROSTAT) 0.4 MG SL tablet Place 1 tablet (0.4 mg total) under the tongue every 5 (five) minutes as needed for chest pain. 07/22/21   Loel Dubonnet, NP  phenazopyridine (PYRIDIUM) 200 MG tablet Take 1 tablet (200 mg total) by mouth 3 (three) times daily as needed for pain. 08/10/20   [name], MD  rosuvastatin (CRESTOR) 10 MG tablet Take 1 tablet (10 mg total) by mouth at bedtime. 08/26/18   [name], MD    Allergies Latex   REVIEW OF SYSTEMS  Negative except as noted here or in the History of Present Illness.   PHYSICAL EXAMINATION  Initial Vital Signs Blood pressure (!) 143/69, pulse (!) 57, temperature 98.2 F (36.8 C), temperature source Oral, resp. rate 15, SpO2 95 %.  Examination General: Well-developed, well-nourished female in no acute  distress; appearance consistent with age of record HENT: normocephalic; atraumatic Eyes: Normal appearance Neck: supple Heart: regular rate and rhythm Lungs: clear to auscultation bilaterally Abdomen: soft; nondistended; nontender; bowel sounds present Extremities: No deformity; full range of motion; pulses normal Neurologic: Awake, alert and oriented; motor function intact in all extremities and symmetric; no facial droop Skin: Warm and dry Psychiatric: Normal mood and affect   RESULTS  Summary of this visit's results, reviewed and interpreted by myself:  EKG Interpretation  Date/Time:    Ventricular Rate:    PR Interval:    QRS Duration:   QT Interval:    QTC Calculation:   R Axis:     Text Interpretation:         Laboratory Studies: Results for orders placed or performed during the hospital encounter of 06/06/22 (from the past 24 hour(s))  SARS Coronavirus 2 by RT PCR (hospital order, performed in [facility] hospital lab) *cepheid single result test* Anterior Nasal Swab     Status: None   Collection Time: 06/06/22  5:14 AM   Specimen: Anterior Nasal Swab  Result Value Ref Range   SARS Coronavirus 2 by RT PCR NEGATIVE NEGATIVE  CBC with Differential     Status: None   Collection Time: 06/06/22  5:14 AM  Result Value Ref Range   WBC 7.5 4.0 - 10.5 K/uL   RBC 4.48 3.87 - 5.11 MIL/uL   Hemoglobin 12.6 12.0 - 15.0 g/dL   HCT 39.4 36.0 - 46.0 %   MCV 87.9 80.0 - 100.0 fL   MCH 28.1 26.0 - 34.0 pg   MCHC 32.0 30.0 - 36.0 g/dL   RDW 14.3 11.5 - 15.5 %   Platelets 171 150 - 400 K/uL   nRBC 0.0 0.0 - 0.2 %   Neutrophils Relative % 39 %   Neutro Abs 2.9 1.7 - 7.7 K/uL   Lymphocytes Relative 51 %   Lymphs Abs 3.9 0.7 - 4.0 K/uL   Monocytes Relative 9 %   Monocytes Absolute 0.7 0.1 - 1.0 K/uL   Eosinophils Relative 1 %   Eosinophils Absolute 0.0 0.0 - 0.5 K/uL   Basophils Relative 0 %   Basophils Absolute 0.0 0.0 - 0.1 K/uL   Immature Granulocytes 0 %   Abs  Immature Granulocytes 0.01 0.00 - 0.07 K/uL  Basic metabolic panel     Status: Abnormal   Collection Time: 06/06/22  5:14 AM  Result Value Ref Range   Sodium 138 135 - 145 mmol/L   Potassium 3.7 3.5 - 5.1 mmol/L   Chloride 105 98 - 111 mmol/L   CO2 28 22 - 32 mmol/L   Glucose, Bld 127 (H) 70 - 99 mg/dL   BUN 13 8 - 23 mg/dL   Creatinine, Ser 0.77 0.44 - 1.00 mg/dL   Calcium 8.9 8.9 - 10.3 mg/dL   GFR, Estimated >60 >60 mL/min   Anion gap 5 5 - 15  Urinalysis, Routine w reflex microscopic Urine, Clean Catch     Status: None   Collection Time: 06/06/22  5:19 AM  Result Value Ref Range   Color, Urine YELLOW YELLOW   APPearance CLEAR CLEAR   Specific Gravity, Urine 1.025 1.005 - 1.030   pH 5.5 5.0 - 8.0   Glucose, UA NEGATIVE NEGATIVE mg/dL   Hgb urine dipstick NEGATIVE NEGATIVE   Bilirubin Urine NEGATIVE NEGATIVE   Ketones, ur NEGATIVE NEGATIVE mg/dL   Protein, ur NEGATIVE NEGATIVE mg/dL   Nitrite NEGATIVE NEGATIVE   Leukocytes,Ua NEGATIVE NEGATIVE   Imaging Studies: DG Chest 2 View  Result Date: 06/06/2022 CLINICAL DATA:  Coughing and shortness of breath. Weakness and productive cough. EXAM: CHEST - 2 VIEW COMPARISON:  Portable chest 01/31/2022 FINDINGS: There are intact sternotomy sutures and CABG change. There is mild cardiomegaly without evidence of CHF. Stable mediastinum with aortic atherosclerosis and tortuosity. Right hemidiaphragm chronically elevated to the level of the mid hilum with overlying atelectatic bands. There are small eventrations of  the left hemidiaphragm. The lungs are clear of focal infiltrates. No pleural effusion is seen. There is thoracic spondylosis.  Right axillary surgical clips. IMPRESSION: No active cardiopulmonary disease. Stable post CABG chest with cardiomegaly and chronically elevated right diaphragm. Electronically Signed   By: Telford Nab M.D.   On: 06/06/2022 06:10    ED COURSE and MDM  Nursing notes, initial and subsequent vitals signs,  including pulse oximetry, reviewed and interpreted by myself.  Vitals:   06/06/22 0457 06/06/22 0504 06/06/22 0600  BP: (!) 143/69  (!) 106/50  Pulse: (!) 57  62  Resp: 15  18  Temp: 98.2 F (36.8 C)    TempSrc: Oral    SpO2: 95%  90%  Weight:  91.6 kg   Height:  '4\' 11"'$  (1.499 m)    Medications  albuterol (VENTOLIN HFA) 108 (90 Base) MCG/ACT inhaler 2 puff (has no administration in time range)    Patient negative for COVID but symptoms are suspicious for a viral illness.  We will refill her albuterol inhaler.  I do not believe antibiotics are indicated at this time.  PROCEDURES  Procedures   ED DIAGNOSES     ICD-10-CM   1. Acute bronchitis with bronchospasm  J20.9     2. COVID-19 virus not detected  Z20.822          Shanon Rosser, MD 06/06/22 [phone]

## 2022-07-01 ENCOUNTER — Emergency Department (HOSPITAL_BASED_OUTPATIENT_CLINIC_OR_DEPARTMENT_OTHER)
Admission: EM | Admit: 2022-07-01 | Discharge: 2022-07-01 | Disposition: A | Discharge status: Home / Self Care | Payer: Medicare Other | Attending: Emergency Medicine | Admitting: Emergency Medicine

## 2022-07-01 ENCOUNTER — Emergency Department (HOSPITAL_BASED_OUTPATIENT_CLINIC_OR_DEPARTMENT_OTHER): Payer: Medicare Other

## 2022-07-01 ENCOUNTER — Other Ambulatory Visit: Payer: Self-pay

## 2022-07-01 ENCOUNTER — Encounter (HOSPITAL_BASED_OUTPATIENT_CLINIC_OR_DEPARTMENT_OTHER): Payer: Self-pay

## 2022-07-01 DIAGNOSIS — R0789 Other chest pain: Secondary | ICD-10-CM | POA: Insufficient documentation

## 2022-07-01 DIAGNOSIS — E119 Type 2 diabetes mellitus without complications: Secondary | ICD-10-CM | POA: Insufficient documentation

## 2022-07-01 DIAGNOSIS — Z853 Personal history of malignant neoplasm of breast: Secondary | ICD-10-CM | POA: Insufficient documentation

## 2022-07-01 DIAGNOSIS — Z79899 Other long term (current) drug therapy: Secondary | ICD-10-CM | POA: Insufficient documentation

## 2022-07-01 DIAGNOSIS — Z7984 Long term (current) use of oral hypoglycemic drugs: Secondary | ICD-10-CM | POA: Insufficient documentation

## 2022-07-01 DIAGNOSIS — Z91040 Latex allergy status: Secondary | ICD-10-CM | POA: Diagnosis not present

## 2022-07-01 DIAGNOSIS — I10 Essential (primary) hypertension: Secondary | ICD-10-CM | POA: Diagnosis not present

## 2022-07-01 DIAGNOSIS — Z7982 Long term (current) use of aspirin: Secondary | ICD-10-CM | POA: Diagnosis not present

## 2022-07-01 DIAGNOSIS — Z951 Presence of aortocoronary bypass graft: Secondary | ICD-10-CM | POA: Diagnosis not present

## 2022-07-01 DIAGNOSIS — Z8679 Personal history of other diseases of the circulatory system: Secondary | ICD-10-CM

## 2022-07-01 DIAGNOSIS — I2510 Atherosclerotic heart disease of native coronary artery without angina pectoris: Secondary | ICD-10-CM | POA: Diagnosis not present

## 2022-07-01 LAB — CBC
HCT: 39.2 % (ref 36.0–46.0)
Hemoglobin: 12.4 g/dL (ref 12.0–15.0)
MCH: 28.1 pg (ref 26.0–34.0)
MCHC: 31.6 g/dL (ref 30.0–36.0)
MCV: 88.9 fL (ref 80.0–100.0)
Platelets: 169 10*3/uL (ref 150–400)
RBC: 4.41 MIL/uL (ref 3.87–5.11)
RDW: 14.8 % (ref 11.5–15.5)
WBC: 6.1 10*3/uL (ref 4.0–10.5)
nRBC: 0 % (ref 0.0–0.2)

## 2022-07-01 LAB — TROPONIN I (HIGH SENSITIVITY)
Troponin I (High Sensitivity): 3 ng/L (ref <18)
Troponin I (High Sensitivity): 3 ng/L (ref <18)

## 2022-07-01 LAB — BASIC METABOLIC PANEL
Anion gap: 6 (ref 5–15)
BUN: 11 mg/dL (ref 8–23)
CO2: 24 mmol/L (ref 22–32)
Calcium: 9 mg/dL (ref 8.9–10.3)
Chloride: 106 mmol/L (ref 98–111)
Creatinine, Ser: 0.7 mg/dL (ref 0.44–1.00)
GFR, Estimated: >60 mL/min (ref >60)
Glucose, Bld: 133 mg/dL — ABNORMAL HIGH (ref 70–99)
Potassium: 3.7 mmol/L (ref 3.5–5.1)
Sodium: 136 mmol/L (ref 135–145)

## 2022-07-01 NOTE — ED Triage Notes (Signed)
Pt c/o sharp CP on LT side that radiates to LT arm. Also has numbness to LT arm and numbness to LT chest. Pt with hx of stents and heart cath. Denies N/V and other sx.

## 2022-07-01 NOTE — ED Provider Notes (Signed)
[facility] EMERGENCY DEPARTMENT Provider Note   CSN: [id number] Arrival date & time: 07/01/22  0205     History  Chief Complaint  Patient presents with   Chest Pain    Patricia Cuevas is a 71 y.o. female.  Patient is a 71 year old female with past medical history of coronary artery disease with CABG, diabetes, hypertension, breast cancer.  Patient presenting today with complaints of chest discomfort.  This started approximately 1030 yesterday evening while she was lying in bed.  She denies any shortness of breath, nausea, diaphoresis, or radiation to the arm or jaw.  She reports taking 2 baby aspirin and 1 nitroglycerin and symptoms seem to improve.  She denies any fevers or chills.  She denies any recent exertional symptoms.  The history is provided by the patient.       Home Medications Prior to Admission medications   Medication Sig Start Date End Date Taking? Authorizing Provider  albuterol (PROVENTIL HFA;VENTOLIN HFA) 108 (90 BASE) MCG/ACT inhaler Inhale 2 puffs into the lungs every 6 (six) hours as needed for shortness of breath.    [provider]  aspirin EC 81 MG tablet Take 81 mg by mouth daily.     [provider]  benzonatate (TESSALON) 100 MG capsule Take 1 capsule (100 mg total) by mouth every 8 (eight) hours. 07/30/21   Palumbo, April, MD  cetirizine (ZYRTEC) 10 MG tablet Take 1 tablet (10 mg total) by mouth daily. Patient taking differently: Take 10 mg by mouth daily as needed for allergies. 08/16/17   Maczis, Barth Kirks, PA-C  cholecalciferol (VITAMIN D3) 25 MCG (1000 UT) tablet Take 1,000 Units by mouth daily.    [provider]  diclofenac Sodium (VOLTAREN) 1 % GEL Apply 4 g topically 4 (four) times daily. 05/01/21   Deno Etienne, DO  fluticasone (FLONASE) 50 MCG/ACT nasal spray Place 2 sprays into both nostrils daily. Patient taking differently: Place 2 sprays into both nostrils daily as needed for allergies. 08/16/17   Maczis,  Barth Kirks, PA-C  glipiZIDE (GLUCOTROL) 10 MG tablet Take 10 mg by mouth every morning. 12/06/20   [provider]  HYDROcodone-acetaminophen (NORCO) 10-325 MG tablet Take 1 tablet by mouth 2 (two) times daily as needed.    [provider]  latanoprost (XALATAN) 0.005 % ophthalmic solution Place 1 drop into both eyes at bedtime as needed (dry eyes).    [provider]  lisinopril-hydrochlorothiazide (PRINZIDE,ZESTORETIC) 20-12.5 MG per tablet Take 1 tablet by mouth daily.    [provider]  magnesium oxide (MAG-OX) 400 MG tablet Take 400 mg by mouth daily as needed (leg cramps). 01/13/21   [provider]  metoprolol tartrate (LOPRESSOR) 25 MG tablet TAKE 1 TABLET BY MOUTH TWICE DAILY 09/30/21   [name], MD  montelukast (SINGULAIR) 10 MG tablet Take 10 mg by mouth at bedtime. 08/14/19   [provider]  nitroGLYCERIN (NITROSTAT) 0.4 MG SL tablet Place 1 tablet (0.4 mg total) under the tongue every 5 (five) minutes as needed for chest pain. 07/22/21   Loel Dubonnet, NP  phenazopyridine (PYRIDIUM) 200 MG tablet Take 1 tablet (200 mg total) by mouth 3 (three) times daily as needed for pain. 08/10/20   [name], MD  rosuvastatin (CRESTOR) 10 MG tablet Take 1 tablet (10 mg total) by mouth at bedtime. 08/26/18   [name], MD      Allergies    Latex    Review of Systems  Review of Systems  All other systems reviewed and are negative.   Physical Exam Updated Vital Signs BP 117/67 (BP Location: Left Arm)   Pulse 74   Temp 98.5 F (36.9 C) (Oral)   Resp 20   Ht '4\' 11"'$  (1.499 m)   Wt 91.6 kg   SpO2 94%   BMI 40.79 kg/m  Physical Exam Vitals and nursing note reviewed.  Constitutional:      General: She is not in acute distress.    Appearance: She is well-developed. She is not diaphoretic.  HENT:     Head: Normocephalic and atraumatic.  Cardiovascular:     Rate and Rhythm: Normal rate and regular rhythm.     Heart  sounds: No murmur heard.    No friction rub. No gallop.  Pulmonary:     Effort: Pulmonary effort is normal. No respiratory distress.     Breath sounds: Normal breath sounds. No wheezing.  Abdominal:     General: Bowel sounds are normal. There is no distension.     Palpations: Abdomen is soft.     Tenderness: There is no abdominal tenderness.  Musculoskeletal:        General: Normal range of motion.     Cervical back: Normal range of motion and neck supple.     Right lower leg: No tenderness. No edema.     Left lower leg: No tenderness. No edema.  Skin:    General: Skin is warm and dry.  Neurological:     General: No focal deficit present.     Mental Status: She is alert and oriented to person, place, and time.     ED Results / Procedures / Treatments   Labs (all labs ordered are listed, but only abnormal results are displayed) Labs Reviewed  BASIC METABOLIC PANEL - Abnormal; Notable for the following components:      Result Value   Glucose, Bld 133 (*)    All other components within normal limits  CBC  TROPONIN I (HIGH SENSITIVITY)  TROPONIN I (HIGH SENSITIVITY)    EKG EKG Interpretation  Date/Time:  Wednesday July 01 2022 02:17:40 EDT Ventricular Rate:  70 PR Interval:  146 QRS Duration: 94 QT Interval:  418 QTC Calculation: 451 R Axis:   -9 Text Interpretation: Normal sinus rhythm with sinus arrhythmia Nonspecific T wave abnormality Abnormal ECG Confirmed by Veryl Speak [id number]) on 07/01/2022 2:42:31 AM  Radiology DG Chest 2 View  Result Date: 07/01/2022 CLINICAL DATA:  Chest pain EXAM: CHEST - 2 VIEW COMPARISON:  06/06/2022 FINDINGS: Stable elevation of the right hemidiaphragm. Prior CABG. Heart is normal size. No confluent opacities or effusions. No acute bony abnormality. IMPRESSION: Chronic elevation of the right hemidiaphragm.  No active disease. Electronically Signed   By: Rolm Baptise M.D.   On: 07/01/2022 02:37    Procedures Procedures     Medications Ordered in ED Medications - No data to display  ED Course/ Medical Decision Making/ A&P  Patient with history of coronary artery disease status post CABG in the past presenting with complaints of chest discomfort.  This started approximately 1030 while she was lying down.  Shortly afterward, she took some nitroglycerin and 2 baby aspirin and this did seem to help.  She has been symptom-free while in the ER.  She arrives here afebrile with stable vital signs.  Physical examination is unremarkable.  Work-up initiated including EKG, chest x-ray, troponin x2, and CBC/BMP.  The studies were all unremarkable.  Her EKG is  unchanged and troponin x2 is negative.  Patient describes symptoms that are different than what she experienced with her prior heart pain.  She has been asymptomatic for several hours and 2 troponins are both negative.  At this point, I feel as though patient can safely be discharged with outpatient follow-up and as needed return.  Final Clinical Impression(s) / ED Diagnoses Final diagnoses:  None    Rx / DC Orders ED Discharge Orders     None         Veryl Speak, MD 07/01/22 [phone]

## 2022-07-01 NOTE — Discharge Instructions (Signed)
Continue medications as previously prescribed.  Return to the emergency department for worsening chest pain, difficulty breathing, high fevers, or for other new and concerning symptoms.

## 2022-08-02 ENCOUNTER — Other Ambulatory Visit: Payer: Self-pay

## 2022-08-02 ENCOUNTER — Emergency Department (HOSPITAL_BASED_OUTPATIENT_CLINIC_OR_DEPARTMENT_OTHER): Payer: Medicare Other

## 2022-08-02 ENCOUNTER — Encounter (HOSPITAL_BASED_OUTPATIENT_CLINIC_OR_DEPARTMENT_OTHER): Payer: Self-pay | Admitting: Emergency Medicine

## 2022-08-02 ENCOUNTER — Emergency Department (HOSPITAL_BASED_OUTPATIENT_CLINIC_OR_DEPARTMENT_OTHER)
Admission: EM | Admit: 2022-08-02 | Discharge: 2022-08-02 | Disposition: A | Discharge status: Home / Self Care | Payer: Medicare Other | Attending: Emergency Medicine | Admitting: Emergency Medicine

## 2022-08-02 DIAGNOSIS — Z7982 Long term (current) use of aspirin: Secondary | ICD-10-CM | POA: Insufficient documentation

## 2022-08-02 DIAGNOSIS — N3000 Acute cystitis without hematuria: Secondary | ICD-10-CM | POA: Diagnosis not present

## 2022-08-02 DIAGNOSIS — I10 Essential (primary) hypertension: Secondary | ICD-10-CM | POA: Diagnosis not present

## 2022-08-02 DIAGNOSIS — Z91040 Latex allergy status: Secondary | ICD-10-CM | POA: Insufficient documentation

## 2022-08-02 DIAGNOSIS — Z7984 Long term (current) use of oral hypoglycemic drugs: Secondary | ICD-10-CM | POA: Diagnosis not present

## 2022-08-02 DIAGNOSIS — E119 Type 2 diabetes mellitus without complications: Secondary | ICD-10-CM | POA: Insufficient documentation

## 2022-08-02 DIAGNOSIS — I2510 Atherosclerotic heart disease of native coronary artery without angina pectoris: Secondary | ICD-10-CM | POA: Diagnosis not present

## 2022-08-02 DIAGNOSIS — N939 Abnormal uterine and vaginal bleeding, unspecified: Secondary | ICD-10-CM | POA: Diagnosis present

## 2022-08-02 DIAGNOSIS — Z79899 Other long term (current) drug therapy: Secondary | ICD-10-CM | POA: Insufficient documentation

## 2022-08-02 LAB — URINALYSIS, ROUTINE W REFLEX MICROSCOPIC
Bilirubin Urine: NEGATIVE
Glucose, UA: NEGATIVE mg/dL
Ketones, ur: NEGATIVE mg/dL
Nitrite: POSITIVE — AB
Protein, ur: >=300 mg/dL — AB
Specific Gravity, Urine: 1.025 (ref 1.005–1.030)
pH: 5.5 (ref 5.0–8.0)

## 2022-08-02 LAB — CBC WITH DIFFERENTIAL/PLATELET
Abs Immature Granulocytes: 0.02 10*3/uL (ref 0.00–0.07)
Basophils Absolute: 0 10*3/uL (ref 0.0–0.1)
Basophils Relative: 0 %
Eosinophils Absolute: 0 10*3/uL (ref 0.0–0.5)
Eosinophils Relative: 0 %
HCT: 40.5 % (ref 36.0–46.0)
Hemoglobin: 13 g/dL (ref 12.0–15.0)
Immature Granulocytes: 0 %
Lymphocytes Relative: 29 %
Lymphs Abs: 2.5 10*3/uL (ref 0.7–4.0)
MCH: 28.5 pg (ref 26.0–34.0)
MCHC: 32.1 g/dL (ref 30.0–36.0)
MCV: 88.8 fL (ref 80.0–100.0)
Monocytes Absolute: 0.6 10*3/uL (ref 0.1–1.0)
Monocytes Relative: 7 %
Neutro Abs: 5.3 10*3/uL (ref 1.7–7.7)
Neutrophils Relative %: 64 %
Platelets: 160 10*3/uL (ref 150–400)
RBC: 4.56 MIL/uL (ref 3.87–5.11)
RDW: 14.9 % (ref 11.5–15.5)
WBC: 8.5 10*3/uL (ref 4.0–10.5)
nRBC: 0 % (ref 0.0–0.2)

## 2022-08-02 LAB — COMPREHENSIVE METABOLIC PANEL
ALT: 16 U/L (ref 0–44)
AST: 25 U/L (ref 15–41)
Albumin: 3.6 g/dL (ref 3.5–5.0)
Alkaline Phosphatase: 62 U/L (ref 38–126)
Anion gap: 6 (ref 5–15)
BUN: 10 mg/dL (ref 8–23)
CO2: 26 mmol/L (ref 22–32)
Calcium: 8.6 mg/dL — ABNORMAL LOW (ref 8.9–10.3)
Chloride: 104 mmol/L (ref 98–111)
Creatinine, Ser: 0.81 mg/dL (ref 0.44–1.00)
GFR, Estimated: >60 mL/min (ref >60)
Glucose, Bld: 135 mg/dL — ABNORMAL HIGH (ref 70–99)
Potassium: 4.9 mmol/L (ref 3.5–5.1)
Sodium: 136 mmol/L (ref 135–145)
Total Bilirubin: 1.1 mg/dL (ref 0.3–1.2)
Total Protein: 7.3 g/dL (ref 6.5–8.1)

## 2022-08-02 LAB — URINALYSIS, MICROSCOPIC (REFLEX): WBC, UA: >50 WBC/hpf (ref 0–5)

## 2022-08-02 LAB — OCCULT BLOOD X 1 CARD TO LAB, STOOL: Fecal Occult Bld: NEGATIVE

## 2022-08-02 MED ORDER — ACETAMINOPHEN 325 MG PO TABS
650.0000 mg | ORAL_TABLET | Freq: Once | ORAL | Status: AC
  Administered 2022-08-02: 650 mg via ORAL
  Filled 2022-08-02: qty 2

## 2022-08-02 MED ORDER — IOHEXOL 300 MG/ML  SOLN
100.0000 mL | Freq: Once | INTRAMUSCULAR | Status: AC | PRN
  Administered 2022-08-02: 100 mL via INTRAVENOUS

## 2022-08-02 MED ORDER — CEPHALEXIN 500 MG PO CAPS: 500.0000 mg | ORAL_CAPSULE | Freq: Two times a day (BID) | ORAL | 0 refills | Status: AC

## 2022-08-02 MED ORDER — CEPHALEXIN 250 MG PO CAPS
500.0000 mg | ORAL_CAPSULE | Freq: Once | ORAL | Status: AC
  Administered 2022-08-02: 500 mg via ORAL
  Filled 2022-08-02: qty 2

## 2022-08-02 MED ORDER — CEPHALEXIN 500 MG PO CAPS: 500.0000 mg | ORAL_CAPSULE | Freq: Two times a day (BID) | ORAL | 0 refills | Status: DC

## 2022-08-02 NOTE — ED Provider Notes (Addendum)
[facility] EMERGENCY DEPARTMENT Provider Note   CSN: [id number] Arrival date & time: 08/02/22  1431     History  Chief Complaint  Patient presents with   Vaginal Bleeding    Patricia Cuevas is a 71 y.o. female.  Urinalysis appears patient here with concern for may be blood from her vagina or from her urine.  Since yesterday she has had some spotting but noticed some blood after urinating today in the toilet bowl.  Some blood on the tissue paper as well.  She states she has a history of kidney stones.  She denies any bloody stools but thought her stools been darker recently.  She has not had any large blood clots or any large amount of bleeding.  She is not sure if blood is coming from her vagina or from her urine.  She is having some pelvic pressure and some urinary tract symptoms.  She denies being on any blood thinners.  She has a history of hypertension, diabetes, CAD, tobacco use.  Denies any chest pain or shortness of breath or lightheadedness.  The history is provided by the patient.       Home Medications Prior to Admission medications   Medication Sig Start Date End Date Taking? Authorizing Provider  cephALEXin (KEFLEX) 500 MG capsule Take 1 capsule (500 mg total) by mouth 2 (two) times daily for 5 days. 08/02/22 08/07/22 Yes Rehema Muffley, DO  albuterol (PROVENTIL HFA;VENTOLIN HFA) 108 (90 BASE) MCG/ACT inhaler Inhale 2 puffs into the lungs every 6 (six) hours as needed for shortness of breath.    [provider]  aspirin EC 81 MG tablet Take 81 mg by mouth daily.     [provider]  benzonatate (TESSALON) 100 MG capsule Take 1 capsule (100 mg total) by mouth every 8 (eight) hours. 07/30/21   Palumbo, April, MD  cetirizine (ZYRTEC) 10 MG tablet Take 1 tablet (10 mg total) by mouth daily. Patient taking differently: Take 10 mg by mouth daily as needed for allergies. 08/16/17   Maczis, Barth Kirks, PA-C  cholecalciferol (VITAMIN D3) 25 MCG (1000  UT) tablet Take 1,000 Units by mouth daily.    [provider]  diclofenac Sodium (VOLTAREN) 1 % GEL Apply 4 g topically 4 (four) times daily. 05/01/21   Deno Etienne, DO  fluticasone (FLONASE) 50 MCG/ACT nasal spray Place 2 sprays into both nostrils daily. Patient taking differently: Place 2 sprays into both nostrils daily as needed for allergies. 08/16/17   Maczis, Barth Kirks, PA-C  glipiZIDE (GLUCOTROL) 10 MG tablet Take 10 mg by mouth every morning. 12/06/20   [provider]  HYDROcodone-acetaminophen (NORCO) 10-325 MG tablet Take 1 tablet by mouth 2 (two) times daily as needed.    [provider]  latanoprost (XALATAN) 0.005 % ophthalmic solution Place 1 drop into both eyes at bedtime as needed (dry eyes).    [provider]  lisinopril-hydrochlorothiazide (PRINZIDE,ZESTORETIC) 20-12.5 MG per tablet Take 1 tablet by mouth daily.    [provider]  magnesium oxide (MAG-OX) 400 MG tablet Take 400 mg by mouth daily as needed (leg cramps). 01/13/21   [provider]  metoprolol tartrate (LOPRESSOR) 25 MG tablet TAKE 1 TABLET BY MOUTH TWICE DAILY 09/30/21   [name], MD  montelukast (SINGULAIR) 10 MG tablet Take 10 mg by mouth at bedtime. 08/14/19   [provider]  nitroGLYCERIN (NITROSTAT) 0.4 MG SL tablet Place 1 tablet (0.4 mg total) under the tongue every 5 (  five) minutes as needed for chest pain. 07/22/21   Loel Dubonnet, NP  phenazopyridine (PYRIDIUM) 200 MG tablet Take 1 tablet (200 mg total) by mouth 3 (three) times daily as needed for pain. 08/10/20   [name], MD  rosuvastatin (CRESTOR) 10 MG tablet Take 1 tablet (10 mg total) by mouth at bedtime. 08/26/18   [name], MD      Allergies    Latex    Review of Systems   Review of Systems  Physical Exam Updated Vital Signs BP (!) 114/56   Pulse 83   Temp 98.2 F (36.8 C) (Oral)   Resp 18   Ht '4\' 11"'$  (1.499 m)   Wt 91.6 kg   SpO2 94%   BMI 40.80 kg/m   Physical Exam Vitals and nursing note reviewed.  Constitutional:      General: She is not in acute distress.    Appearance: She is well-developed. She is not ill-appearing.  HENT:     Head: Normocephalic and atraumatic.     Nose: Nose normal.     Mouth/Throat:     Mouth: Mucous membranes are moist.  Eyes:     Extraocular Movements: Extraocular movements intact.     Conjunctiva/sclera: Conjunctivae normal.     Pupils: Pupils are equal, round, and reactive to light.  Cardiovascular:     Rate and Rhythm: Normal rate and regular rhythm.     Pulses: Normal pulses.     Heart sounds: Normal heart sounds. No murmur heard. Pulmonary:     Effort: Pulmonary effort is normal. No respiratory distress.     Breath sounds: Normal breath sounds.  Abdominal:     Palpations: Abdomen is soft.     Tenderness: There is abdominal tenderness.     Comments: Mild tenderness to the suprapubic area on exam  Genitourinary:    Comments: There does not appear to be any blood coming from the vagina on external exam, stool appears to be grossly brown, there is no blood on pad that she is wearing Musculoskeletal:     Cervical back: Normal range of motion and neck supple.  Skin:    General: Skin is warm and dry.     Capillary Refill: Capillary refill takes less than 2 seconds.  Neurological:     General: No focal deficit present.     Mental Status: She is alert.     ED Results / Procedures / Treatments   Labs (all labs ordered are listed, but only abnormal results are displayed) Labs Reviewed  COMPREHENSIVE METABOLIC PANEL - Abnormal; Notable for the following components:      Result Value   Glucose, Bld 135 (*)    Calcium 8.6 (*)    All other components within normal limits  URINALYSIS, ROUTINE W REFLEX MICROSCOPIC - Abnormal; Notable for the following components:   APPearance CLOUDY (*)    Hgb urine dipstick LARGE (*)    Protein, ur >=300 (*)    Nitrite POSITIVE (*)    Leukocytes,Ua MODERATE (*)     All other components within normal limits  URINALYSIS, MICROSCOPIC (REFLEX) - Abnormal; Notable for the following components:   Bacteria, UA MANY (*)    All other components within normal limits  CBC WITH DIFFERENTIAL/PLATELET  OCCULT BLOOD X 1 CARD TO LAB, STOOL    EKG None  Radiology CT ABDOMEN PELVIS W CONTRAST  Result Date: 08/02/2022 CLINICAL DATA:  Abdominal pain, acute, nonlocalized EXAM: CT ABDOMEN AND PELVIS WITH CONTRAST TECHNIQUE:  Multidetector CT imaging of the abdomen and pelvis was performed using the standard protocol following bolus administration of intravenous contrast. RADIATION DOSE REDUCTION: This exam was performed according to the departmental dose-optimization program which includes automated exposure control, adjustment of the mA and/or kV according to patient size and/or use of iterative reconstruction technique. CONTRAST:  183m OMNIPAQUE IOHEXOL 300 MG/ML  SOLN COMPARISON:  October 19, 2019, September 28, 2021. March 07, 2017 FINDINGS: Lower chest: Similar elevation of the RIGHT hemidiaphragm with RIGHT basilar atelectasis. Hepatobiliary: Cholelithiasis without evidence of acute cholecystitis. Focal fatty deposition adjacent the falciform ligament. Portal vein is patent. Pancreas: Unremarkable. No pancreatic ductal dilatation or surrounding inflammatory changes. Spleen: Normal in size without focal abnormality. Adrenals/Urinary Tract: Adrenal glands are unremarkable. Knees enhance symmetrically. No hydronephrosis. No obstructing nephrolithiasis. Bladder is circumferentially thick walled with submucosal enhancement and mild adjacent fat stranding. Mild urothelial enhancement and faint fat stranding is noted adjacent to the LEFT ureter. Stomach/Bowel: No evidence of bowel obstruction. Multiple clips are within the gastric cardia. There is circumferential bowel wall prominence of proximal stomach and distal esophagus. Appendix is normal. Vascular/Lymphatic: Vascular  calcifications throughout the nonaneurysmal abdominal aorta. Reproductive: Status post hysterectomy. Vaginal cuff demonstrates mildly prominent walls with mucosal enhancement. Other: No free air or free fluid. Musculoskeletal: No acute or significant osseous findings. IMPRESSION: 1. Circumferential bladder wall thickening with submucosal enhancement and mild adjacent fat stranding. There is additional mild urothelial enhancement and faint fat stranding adjacent to the LEFT ureter. These findings could reflect underlying cystitis and ureteritis. Recommend correlation with urinalysis. 2. There is circumferential bowel wall thickening of the proximal stomach and distal esophagus. This could reflect esophagitis and gastritis in the appropriate clinical setting. This could be further evaluated with endoscopy as clinically indicated. 3. Vaginal cuff demonstrates mildly prominent walls with mucosal enhancement. This could reflect underlying infection in the appropriate clinical setting. Recommend correlation with physical exam findings. Aortic Atherosclerosis (ICD10-I70.0). Electronically Signed   By: SValentino SaxonM.D.   On: 08/02/2022 17:08    Procedures Procedures    Medications Ordered in ED Medications  iohexol (OMNIPAQUE) 300 MG/ML solution 100 mL (100 mLs Intravenous Contrast Given 08/02/22 1642)  acetaminophen (TYLENOL) tablet 650 mg (650 mg Oral Given 08/02/22 1619)  cephALEXin (KEFLEX) capsule 500 mg (500 mg Oral Given 08/02/22 1642)    ED Course/ Medical Decision Making/ A&P                           Medical Decision Making Amount and/or Complexity of Data Reviewed Labs: ordered. Radiology: ordered.  Risk OTC drugs. Prescription drug management.   MJordyn HofackerDukes is here with possible blood in her urine/vagina.  History of hypertension, diabetes, CAD.  Not on blood thinners.  Overall seems like she is likely having some blood in her urine.  She has noticed some blood after  urination yesterday and today.  Just some spotting when she wipes.  Has not had any blood clots.  She had brown stool on exam.  No obvious blood from the vaginal vault.  Overall suspect differential diagnosis is UTI versus kidney stone versus may be fibroid or some type of abnormal uterine bleeding.  Certainly if this is vaginal bleeding would be concerned about endometrial cancer or some sort of uterine cancer.  Does not appear to be having a GI bleed given brown stool on exam.  Ultimately will check a CBC, CMP, CT scan abdomen pelvis and urine study.  Either way we will likely have her follow-up with OB/GYN in case she needs further work-up for vaginal bleeding.  Urine per my review and interpretation appears consistent with infection.  CT scan also consistent with urinary tract infection.  She is not really having any symptoms of esophagitis or gastritis.  She not having any vaginal discharge.  Overall my suspicion is that this is a UTI.  We will have her do course of antibiotics and follow-up with her primary care doctor to see if she needs any further work-up or OB/GYN follow-up.  I do not think the source of bleeding is vaginal at this time.  I did discuss CT findings about may be some prominent walls of the vaginal cuff.  She will talk with her primary care doctor about possibly arranging OB follow-up for this.  At this time we will treat UTI.  This chart was dictated using voice recognition software.  Despite best efforts to proofread,  errors can occur which can change the documentation meaning.    Final Clinical Impression(s) / ED Diagnoses Final diagnoses:  Acute cystitis without hematuria    Rx / DC Orders ED Discharge Orders          Ordered    cephALEXin (KEFLEX) 500 MG capsule  2 times daily        08/02/22 1715              Lennice Sites, DO 08/02/22 1716    Lennice Sites, DO 08/02/22 1719

## 2022-08-02 NOTE — ED Notes (Signed)
Signature pad not working. Pt verbalized understanding of discharge instructions 

## 2022-08-02 NOTE — Discharge Instructions (Signed)
Overall I suspect her symptoms are from a urinary tract infection.  Please finish course of antibiotics and then follow-up with her primary care doctor to discuss may be the need to follow-up with an OB/GYN.  Take next dose antibiotic tomorrow.

## 2022-08-02 NOTE — ED Triage Notes (Signed)
Pt reports vaginal bleeding and lower pelvic pain since yesterday; denies NVD

## 2022-09-20 IMAGING — DX DG CHEST 1V PORT
1 series · 1 of 1 positions shown · non-contrast
Comparison: Chest radiographs 05/01/2021 and earlier.

CLINICAL DATA: 70-year-old female with productive cough for 2 days
and chest pain. Flu like symptoms.

EXAM:
PORTABLE CHEST 1 VIEW

[chest ap]
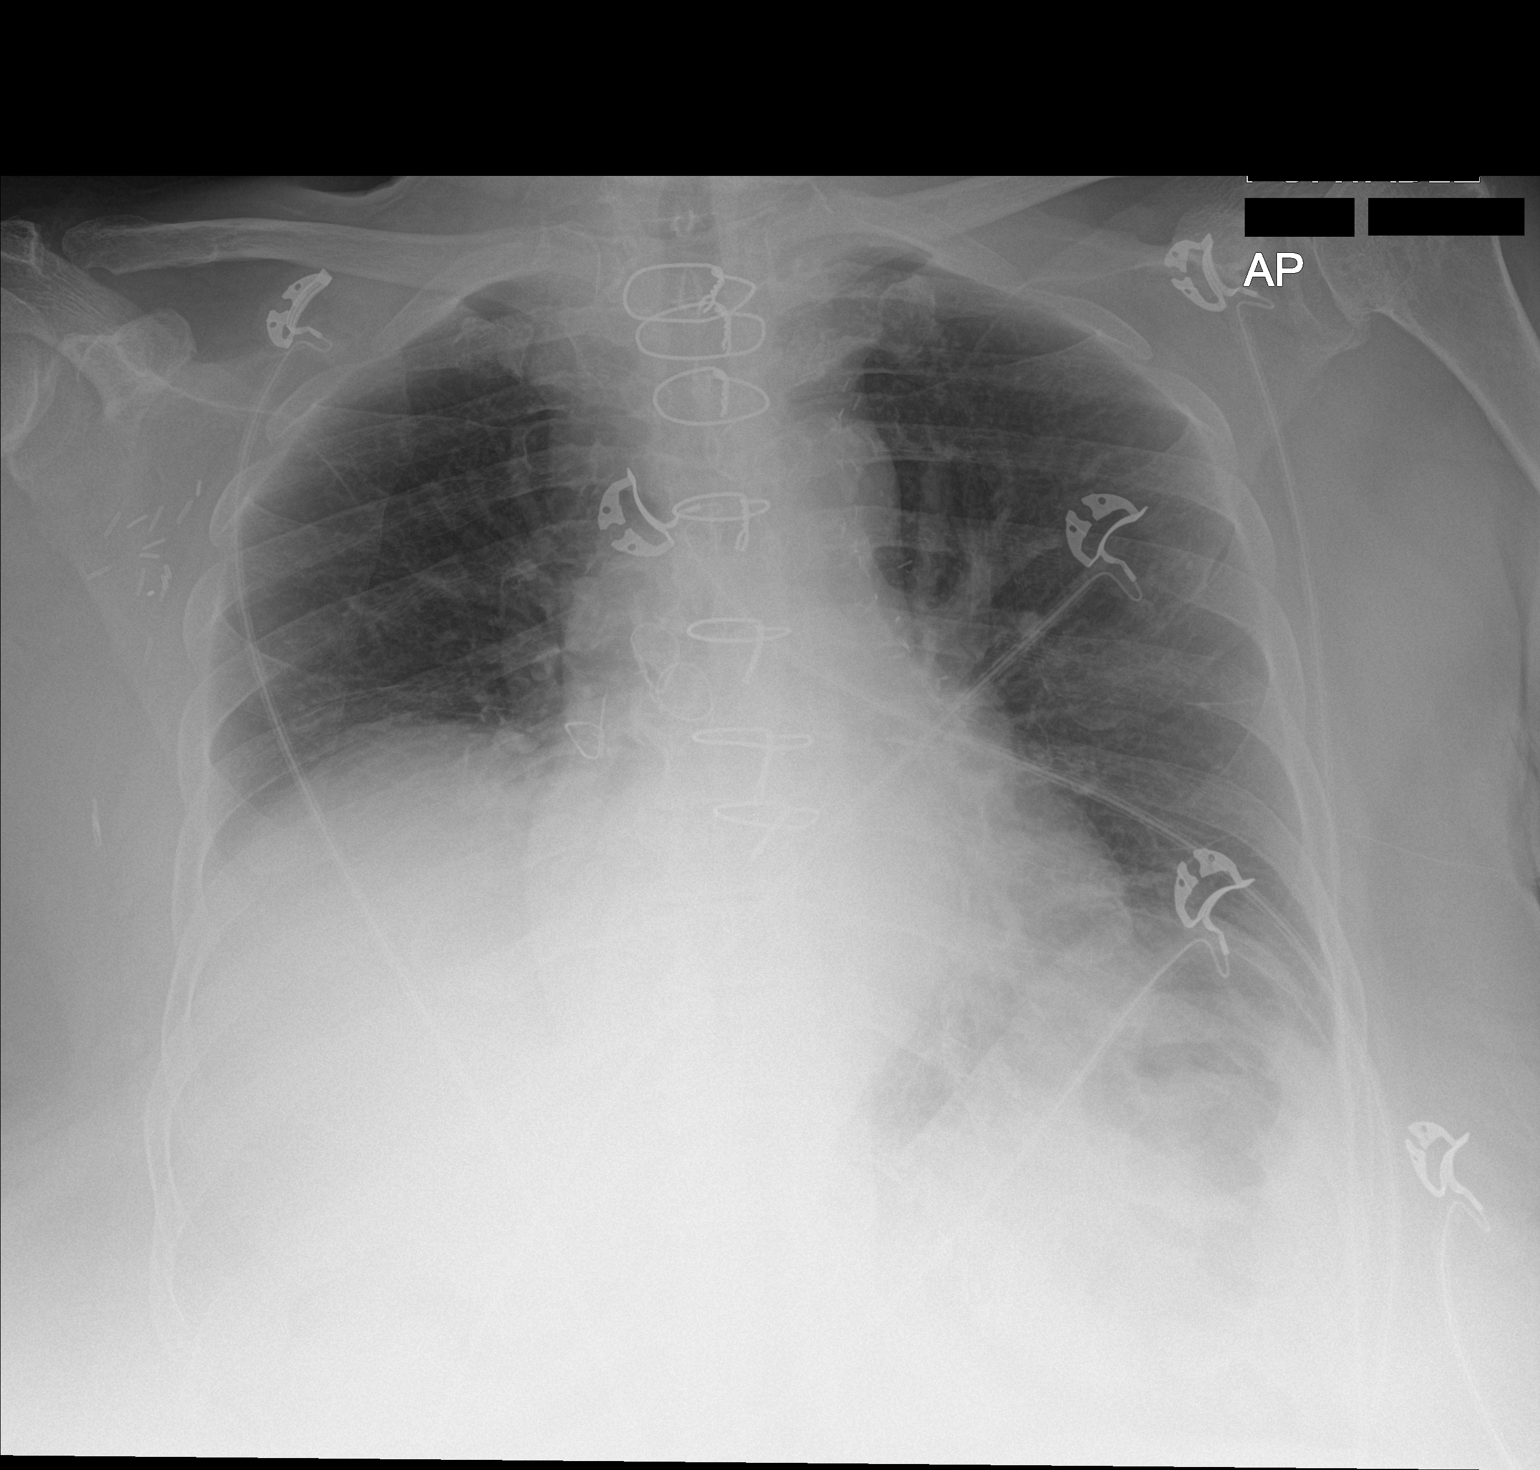

[1 of 1 positions shown; findings below may reference images not displayed]

FINDINGS: Portable AP semi upright view at 8071 hours. Moderate chronic
elevation of the right hemidiaphragm for greater than 10 years,
suggesting chronic right phrenic nerve palsy. Prior CABG.
Mediastinal contours remain within normal limits. Visualized
tracheal air column is within normal limits. No pneumothorax,
pulmonary edema, pleural effusion or acute pulmonary opacity.
Chronic right axillary and chest wall surgical clips. Negative
visible bowel gas. No acute osseous abnormality identified.
IMPRESSION: 1. No acute cardiopulmonary abnormality.
2. Chronic right phrenic nerve palsy again suspected.

## 2022-09-25 ENCOUNTER — Encounter (HOSPITAL_BASED_OUTPATIENT_CLINIC_OR_DEPARTMENT_OTHER): Payer: Self-pay | Admitting: Emergency Medicine

## 2022-09-25 ENCOUNTER — Emergency Department (HOSPITAL_BASED_OUTPATIENT_CLINIC_OR_DEPARTMENT_OTHER)
Admission: EM | Admit: 2022-09-25 | Discharge: 2022-09-26 | Disposition: A | Discharge status: Home / Self Care | Payer: 59 | Attending: Emergency Medicine | Admitting: Emergency Medicine

## 2022-09-25 ENCOUNTER — Other Ambulatory Visit: Payer: Self-pay

## 2022-09-25 DIAGNOSIS — I10 Essential (primary) hypertension: Secondary | ICD-10-CM | POA: Insufficient documentation

## 2022-09-25 DIAGNOSIS — Z7982 Long term (current) use of aspirin: Secondary | ICD-10-CM | POA: Insufficient documentation

## 2022-09-25 DIAGNOSIS — E119 Type 2 diabetes mellitus without complications: Secondary | ICD-10-CM | POA: Insufficient documentation

## 2022-09-25 DIAGNOSIS — Z91040 Latex allergy status: Secondary | ICD-10-CM | POA: Diagnosis not present

## 2022-09-25 DIAGNOSIS — R1030 Lower abdominal pain, unspecified: Secondary | ICD-10-CM | POA: Diagnosis present

## 2022-09-25 DIAGNOSIS — Z951 Presence of aortocoronary bypass graft: Secondary | ICD-10-CM | POA: Insufficient documentation

## 2022-09-25 DIAGNOSIS — Z79899 Other long term (current) drug therapy: Secondary | ICD-10-CM | POA: Diagnosis not present

## 2022-09-25 DIAGNOSIS — N390 Urinary tract infection, site not specified: Secondary | ICD-10-CM

## 2022-09-25 DIAGNOSIS — K8020 Calculus of gallbladder without cholecystitis without obstruction: Secondary | ICD-10-CM | POA: Diagnosis not present

## 2022-09-25 DIAGNOSIS — Z7984 Long term (current) use of oral hypoglycemic drugs: Secondary | ICD-10-CM | POA: Insufficient documentation

## 2022-09-25 DIAGNOSIS — I2510 Atherosclerotic heart disease of native coronary artery without angina pectoris: Secondary | ICD-10-CM | POA: Diagnosis not present

## 2022-09-25 LAB — COMPREHENSIVE METABOLIC PANEL
ALT: 15 U/L (ref 0–44)
AST: 26 U/L (ref 15–41)
Albumin: 4 g/dL (ref 3.5–5.0)
Alkaline Phosphatase: 79 U/L (ref 38–126)
Anion gap: 8 (ref 5–15)
BUN: 11 mg/dL (ref 8–23)
CO2: 24 mmol/L (ref 22–32)
Calcium: 9.1 mg/dL (ref 8.9–10.3)
Chloride: 104 mmol/L (ref 98–111)
Creatinine, Ser: 0.79 mg/dL (ref 0.44–1.00)
GFR, Estimated: >60 mL/min (ref >60)
Glucose, Bld: 93 mg/dL (ref 70–99)
Potassium: 3.7 mmol/L (ref 3.5–5.1)
Sodium: 136 mmol/L (ref 135–145)
Total Bilirubin: 0.2 mg/dL — ABNORMAL LOW (ref 0.3–1.2)
Total Protein: 7.7 g/dL (ref 6.5–8.1)

## 2022-09-25 LAB — CBC WITH DIFFERENTIAL/PLATELET
Abs Immature Granulocytes: 0.01 10*3/uL (ref 0.00–0.07)
Basophils Absolute: 0 10*3/uL (ref 0.0–0.1)
Basophils Relative: 0 %
Eosinophils Absolute: 0.1 10*3/uL (ref 0.0–0.5)
Eosinophils Relative: 1 %
HCT: 41.2 % (ref 36.0–46.0)
Hemoglobin: 13.2 g/dL (ref 12.0–15.0)
Immature Granulocytes: 0 %
Lymphocytes Relative: 51 %
Lymphs Abs: 3.1 10*3/uL (ref 0.7–4.0)
MCH: 28.3 pg (ref 26.0–34.0)
MCHC: 32 g/dL (ref 30.0–36.0)
MCV: 88.2 fL (ref 80.0–100.0)
Monocytes Absolute: 0.5 10*3/uL (ref 0.1–1.0)
Monocytes Relative: 8 %
Neutro Abs: 2.5 10*3/uL (ref 1.7–7.7)
Neutrophils Relative %: 40 %
Platelets: 174 10*3/uL (ref 150–400)
RBC: 4.67 MIL/uL (ref 3.87–5.11)
RDW: 14.7 % (ref 11.5–15.5)
WBC: 6.2 10*3/uL (ref 4.0–10.5)
nRBC: 0 % (ref 0.0–0.2)

## 2022-09-25 LAB — URINALYSIS, ROUTINE W REFLEX MICROSCOPIC
Bilirubin Urine: NEGATIVE
Glucose, UA: NEGATIVE mg/dL
Ketones, ur: NEGATIVE mg/dL
Nitrite: NEGATIVE
Protein, ur: 100 mg/dL — AB
Specific Gravity, Urine: 1.025 (ref 1.005–1.030)
pH: 6.5 (ref 5.0–8.0)

## 2022-09-25 LAB — URINALYSIS, MICROSCOPIC (REFLEX)

## 2022-09-25 NOTE — ED Triage Notes (Signed)
Patient c/o abdominal pain and cramping since yesterday, states she has burning with urination and noticed blood today on the tissue after urinating.

## 2022-09-25 NOTE — ED Provider Notes (Signed)
[facility] EMERGENCY DEPARTMENT Provider Note   CSN: [id number] Arrival date & time: 09/25/22  1953     History {Add pertinent medical, surgical, social history, OB history to HPI:1} Chief Complaint  Patient presents with   Abdominal Pain    Patricia Cuevas is a 72 y.o. female.  The history is provided by the patient.  Abdominal Pain Patricia Cuevas is a 72 y.o. female who presents to the Emergency Department complaining of abdominal pain. Lower abdominal pain that started yesterday morning.  This morning had a very dark BM. Today has dysuria.   No fever, N/V.       Home Medications Prior to Admission medications   Medication Sig Start Date End Date Taking? Authorizing Provider  albuterol (PROVENTIL HFA;VENTOLIN HFA) 108 (90 BASE) MCG/ACT inhaler Inhale 2 puffs into the lungs every 6 (six) hours as needed for shortness of breath.    [provider]  aspirin EC 81 MG tablet Take 81 mg by mouth daily.     [provider]  benzonatate (TESSALON) 100 MG capsule Take 1 capsule (100 mg total) by mouth every 8 (eight) hours. 07/30/21   Palumbo, April, MD  cetirizine (ZYRTEC) 10 MG tablet Take 1 tablet (10 mg total) by mouth daily. Patient taking differently: Take 10 mg by mouth daily as needed for allergies. 08/16/17   Maczis, Barth Kirks, PA-C  cholecalciferol (VITAMIN D3) 25 MCG (1000 UT) tablet Take 1,000 Units by mouth daily.    [provider]  diclofenac Sodium (VOLTAREN) 1 % GEL Apply 4 g topically 4 (four) times daily. 05/01/21   Deno Etienne, DO  fluticasone (FLONASE) 50 MCG/ACT nasal spray Place 2 sprays into both nostrils daily. Patient taking differently: Place 2 sprays into both nostrils daily as needed for allergies. 08/16/17   Maczis, Barth Kirks, PA-C  glipiZIDE (GLUCOTROL) 10 MG tablet Take 10 mg by mouth every morning. 12/06/20   [provider]  HYDROcodone-acetaminophen (NORCO) 10-325 MG tablet Take 1 tablet by mouth 2 (two)  times daily as needed.    [provider]  latanoprost (XALATAN) 0.005 % ophthalmic solution Place 1 drop into both eyes at bedtime as needed (dry eyes).    [provider]  lisinopril-hydrochlorothiazide (PRINZIDE,ZESTORETIC) 20-12.5 MG per tablet Take 1 tablet by mouth daily.    [provider]  magnesium oxide (MAG-OX) 400 MG tablet Take 400 mg by mouth daily as needed (leg cramps). 01/13/21   [provider]  metoprolol tartrate (LOPRESSOR) 25 MG tablet TAKE 1 TABLET BY MOUTH TWICE DAILY 09/30/21   [name], MD  montelukast (SINGULAIR) 10 MG tablet Take 10 mg by mouth at bedtime. 08/14/19   [provider]  nitroGLYCERIN (NITROSTAT) 0.4 MG SL tablet Place 1 tablet (0.4 mg total) under the tongue every 5 (five) minutes as needed for chest pain. 07/22/21   Loel Dubonnet, NP  phenazopyridine (PYRIDIUM) 200 MG tablet Take 1 tablet (200 mg total) by mouth 3 (three) times daily as needed for pain. 08/10/20   [name], MD  rosuvastatin (CRESTOR) 10 MG tablet Take 1 tablet (10 mg total) by mouth at bedtime. 08/26/18   [name], MD      Allergies    Latex    Review of Systems   Review of Systems  Gastrointestinal:  Positive for abdominal pain.  All other systems reviewed and are negative.   Physical Exam Updated Vital Signs BP (!) 130/59   Pulse 70  Temp 98.4 F (36.9 C) (Oral)   Resp 16   Ht '4\' 11"'$  (1.499 m)   Wt 90.7 kg   SpO2 94%   BMI 40.40 kg/m  Physical Exam Vitals and nursing note reviewed.  Constitutional:      Appearance: She is well-developed.  HENT:     Head: Normocephalic and atraumatic.  Cardiovascular:     Rate and Rhythm: Normal rate and regular rhythm.  Pulmonary:     Effort: Pulmonary effort is normal. No respiratory distress.  Abdominal:     Palpations: Abdomen is soft.     Tenderness: There is no guarding or rebound.     Comments: Moderate lower abdominal tenderness  Musculoskeletal:         General: No tenderness.  Skin:    General: Skin is warm and dry.  Neurological:     Mental Status: She is alert and oriented to person, place, and time.  Psychiatric:        Behavior: Behavior normal.     ED Results / Procedures / Treatments   Labs (all labs ordered are listed, but only abnormal results are displayed) Labs Reviewed  URINALYSIS, ROUTINE W REFLEX MICROSCOPIC - Abnormal; Notable for the following components:      Result Value   APPearance CLOUDY (*)    Hgb urine dipstick MODERATE (*)    Protein, ur 100 (*)    Leukocytes,Ua MODERATE (*)    All other components within normal limits  COMPREHENSIVE METABOLIC PANEL - Abnormal; Notable for the following components:   Total Bilirubin 0.2 (*)    All other components within normal limits  URINALYSIS, MICROSCOPIC (REFLEX) - Abnormal; Notable for the following components:   Bacteria, UA FEW (*)    All other components within normal limits  CBC WITH DIFFERENTIAL/PLATELET    EKG None  Radiology No results found.  Procedures Procedures  {Document cardiac monitor, telemetry assessment procedure when appropriate:1}  Medications Ordered in ED Medications - No data to display  ED Course/ Medical Decision Making/ A&P   {   Click here for ABCD2, HEART and other calculatorsREFRESH Note before signing :1}                          Medical Decision Making Amount and/or Complexity of Data Reviewed Labs: ordered.   ***  {Document critical care time when appropriate:1} {Document review of labs and clinical decision tools ie heart score, Chads2Vasc2 etc:1}  {Document your independent review of radiology images, and any outside records:1} {Document your discussion with family members, caretakers, and with consultants:1} {Document social determinants of health affecting pt's care:1} {Document your decision making why or why not admission, treatments were needed:1} Final Clinical Impression(s) / ED Diagnoses Final  diagnoses:  None    Rx / DC Orders ED Discharge Orders     None

## 2022-09-26 ENCOUNTER — Emergency Department (HOSPITAL_BASED_OUTPATIENT_CLINIC_OR_DEPARTMENT_OTHER): Payer: 59

## 2022-09-26 DIAGNOSIS — K8020 Calculus of gallbladder without cholecystitis without obstruction: Secondary | ICD-10-CM | POA: Diagnosis not present

## 2022-09-26 MED ORDER — CEPHALEXIN 500 MG PO CAPS: 500.0000 mg | ORAL_CAPSULE | Freq: Two times a day (BID) | ORAL | 0 refills | Status: DC

## 2022-09-26 MED ORDER — CEPHALEXIN 250 MG PO CAPS
500.0000 mg | ORAL_CAPSULE | Freq: Once | ORAL | Status: AC
  Administered 2022-09-26: 500 mg via ORAL
  Filled 2022-09-26: qty 2

## 2022-09-26 MED ORDER — SODIUM CHLORIDE 0.9 % IV SOLN: 1.0000 g | Freq: Once | INTRAVENOUS | Status: DC

## 2022-09-26 MED ORDER — ACETAMINOPHEN 325 MG PO TABS
650.0000 mg | ORAL_TABLET | Freq: Once | ORAL | Status: AC
  Administered 2022-09-26: 650 mg via ORAL
  Filled 2022-09-26: qty 2

## 2022-09-28 LAB — URINE CULTURE: Culture: 80000 — AB

## 2022-09-29 ENCOUNTER — Telehealth (HOSPITAL_BASED_OUTPATIENT_CLINIC_OR_DEPARTMENT_OTHER): Payer: Self-pay

## 2022-09-29 NOTE — Telephone Encounter (Signed)
Post ED Visit - Positive Culture Follow-up  Culture report reviewed by antimicrobial stewardship pharmacist: [place] Team '[]'$  Elenor Quinones, Pharm.D. '[]'$  [name], Pharm.D., BCPS AQ-ID '[x]'$  Luisa Hart, Pharm.D., BCPS '[]'$  Alycia Rossetti, Pharm.D., BCPS '[]'$  [place], Pharm.D., BCPS, AAHIVP '[]'$  Legrand Como, Pharm.D., BCPS, AAHIVP '[]'$  Salome Arnt, PharmD, BCPS '[]'$  Johnnette Gourd, PharmD, BCPS '[]'$  Hughes Better, PharmD, BCPS '[]'$  Leeroy Cha, PharmD '[]'$  Laqueta Linden, PharmD, BCPS '[]'$  Albertina Parr, PharmD  [place] Team '[]'$  Leodis Sias, PharmD '[]'$  Lindell Spar, PharmD '[]'$  Royetta Asal, PharmD '[]'$  Graylin Shiver, Rph '[]'$  Rema Fendt) Glennon Mac, PharmD '[]'$  Arlyn Dunning, PharmD '[]'$  Netta Cedars, PharmD '[]'$  Dia Sitter, PharmD '[]'$  Leone Haven, PharmD '[]'$  Gretta Arab, PharmD '[]'$  Theodis Shove, PharmD '[]'$  Peggyann Juba, PharmD '[]'$  Reuel Boom, PharmD   Positive urine culture Treated with Cephalexin, organism sensitive to the same and no further patient follow-up is required at this time.  Glennon Hamilton 09/29/2022, 12:26 PM

## 2022-10-11 ENCOUNTER — Emergency Department (HOSPITAL_BASED_OUTPATIENT_CLINIC_OR_DEPARTMENT_OTHER): Payer: 59

## 2022-10-11 ENCOUNTER — Emergency Department (HOSPITAL_BASED_OUTPATIENT_CLINIC_OR_DEPARTMENT_OTHER)
Admission: EM | Admit: 2022-10-11 | Discharge: 2022-10-12 | Disposition: A | Discharge status: Home / Self Care | Payer: 59 | Attending: Emergency Medicine | Admitting: Emergency Medicine

## 2022-10-11 ENCOUNTER — Encounter (HOSPITAL_BASED_OUTPATIENT_CLINIC_OR_DEPARTMENT_OTHER): Payer: Self-pay

## 2022-10-11 DIAGNOSIS — Z955 Presence of coronary angioplasty implant and graft: Secondary | ICD-10-CM | POA: Diagnosis not present

## 2022-10-11 DIAGNOSIS — Z7982 Long term (current) use of aspirin: Secondary | ICD-10-CM | POA: Insufficient documentation

## 2022-10-11 DIAGNOSIS — Z20822 Contact with and (suspected) exposure to covid-19: Secondary | ICD-10-CM | POA: Diagnosis not present

## 2022-10-11 DIAGNOSIS — I2510 Atherosclerotic heart disease of native coronary artery without angina pectoris: Secondary | ICD-10-CM | POA: Diagnosis not present

## 2022-10-11 DIAGNOSIS — M25511 Pain in right shoulder: Secondary | ICD-10-CM | POA: Insufficient documentation

## 2022-10-11 DIAGNOSIS — R079 Chest pain, unspecified: Secondary | ICD-10-CM | POA: Diagnosis not present

## 2022-10-11 DIAGNOSIS — Z91040 Latex allergy status: Secondary | ICD-10-CM | POA: Insufficient documentation

## 2022-10-11 DIAGNOSIS — R0789 Other chest pain: Secondary | ICD-10-CM

## 2022-10-11 NOTE — ED Triage Notes (Addendum)
Pt reports RT sided CP that began in the morning and has now radiated to her RT shoulder. Denies N/V/D. Pt denies SOB at this time. 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 Pt endorses a cough and congestion at this time. Has not been around anyone who is sick.

## 2022-10-12 DIAGNOSIS — R079 Chest pain, unspecified: Secondary | ICD-10-CM | POA: Diagnosis not present

## 2022-10-12 LAB — BASIC METABOLIC PANEL
Anion gap: 6 (ref 5–15)
BUN: 11 mg/dL (ref 8–23)
CO2: 26 mmol/L (ref 22–32)
Calcium: 8.9 mg/dL (ref 8.9–10.3)
Chloride: 101 mmol/L (ref 98–111)
Creatinine, Ser: 0.77 mg/dL (ref 0.44–1.00)
GFR, Estimated: >60 mL/min (ref >60)
Glucose, Bld: 104 mg/dL — ABNORMAL HIGH (ref 70–99)
Potassium: 3.8 mmol/L (ref 3.5–5.1)
Sodium: 133 mmol/L — ABNORMAL LOW (ref 135–145)

## 2022-10-12 LAB — RESP PANEL BY RT-PCR (RSV, FLU A&B, COVID)  RVPGX2
Influenza A by PCR: NEGATIVE
Influenza B by PCR: NEGATIVE
Resp Syncytial Virus by PCR: NEGATIVE
SARS Coronavirus 2 by RT PCR: NEGATIVE

## 2022-10-12 LAB — CBC
HCT: 42.2 % (ref 36.0–46.0)
Hemoglobin: 13.5 g/dL (ref 12.0–15.0)
MCH: 28 pg (ref 26.0–34.0)
MCHC: 32 g/dL (ref 30.0–36.0)
MCV: 87.6 fL (ref 80.0–100.0)
Platelets: 208 10*3/uL (ref 150–400)
RBC: 4.82 MIL/uL (ref 3.87–5.11)
RDW: 14.6 % (ref 11.5–15.5)
WBC: 8.2 10*3/uL (ref 4.0–10.5)
nRBC: 0 % (ref 0.0–0.2)

## 2022-10-12 LAB — TROPONIN I (HIGH SENSITIVITY): Troponin I (High Sensitivity): 3 ng/L (ref <18)

## 2022-10-12 NOTE — ED Notes (Addendum)
Attempted IV start x 2, pt did not tolerate well, c/o of pain with every movement of the catheter, notified 2nd RN to attempt

## 2022-10-12 NOTE — ED Provider Notes (Signed)
[facility] Provider Note   CSN: [id number] Arrival date & time: 10/11/22  2329     History  Chief Complaint  Patient presents with   Chest Pain    Patricia Cuevas is a 72 y.o. female.  Patient is a 72 year old female presenting with complaints of right-sided chest pain.  This began earlier this morning.  The pain radiates into her right shoulder and is sharp in nature.  It is worse when she moves or turns on that side.  She denies any shortness of breath, nausea, diaphoresis, or radiation to the arm or jaw.  Patient does have prior history of coronary artery disease with CABG in the past.  Symptoms feel different than what she experienced with her prior heart issues.  The history is provided by the patient.       Home Medications Prior to Admission medications   Medication Sig Start Date End Date Taking? Authorizing Provider  albuterol (PROVENTIL HFA;VENTOLIN HFA) 108 (90 BASE) MCG/ACT inhaler Inhale 2 puffs into the lungs every 6 (six) hours as needed for shortness of breath.    [provider]  aspirin EC 81 MG tablet Take 81 mg by mouth daily.     [provider]  benzonatate (TESSALON) 100 MG capsule Take 1 capsule (100 mg total) by mouth every 8 (eight) hours. 07/30/21   Palumbo, April, MD  cephALEXin (KEFLEX) 500 MG capsule Take 1 capsule (500 mg total) by mouth 2 (two) times daily. 09/26/22   Quintella Reichert, MD  cetirizine (ZYRTEC) 10 MG tablet Take 1 tablet (10 mg total) by mouth daily. Patient taking differently: Take 10 mg by mouth daily as needed for allergies. 08/16/17   Maczis, Barth Kirks, PA-C  cholecalciferol (VITAMIN D3) 25 MCG (1000 UT) tablet Take 1,000 Units by mouth daily.    [provider]  diclofenac Sodium (VOLTAREN) 1 % GEL Apply 4 g topically 4 (four) times daily. 05/01/21   Deno Etienne, DO  fluticasone (FLONASE) 50 MCG/ACT nasal spray Place 2 sprays into both nostrils daily. Patient  taking differently: Place 2 sprays into both nostrils daily as needed for allergies. 08/16/17   Maczis, Barth Kirks, PA-C  glipiZIDE (GLUCOTROL) 10 MG tablet Take 10 mg by mouth every morning. 12/06/20   [provider]  HYDROcodone-acetaminophen (NORCO) 10-325 MG tablet Take 1 tablet by mouth 2 (two) times daily as needed.    [provider]  latanoprost (XALATAN) 0.005 % ophthalmic solution Place 1 drop into both eyes at bedtime as needed (dry eyes).    [provider]  lisinopril-hydrochlorothiazide (PRINZIDE,ZESTORETIC) 20-12.5 MG per tablet Take 1 tablet by mouth daily.    [provider]  magnesium oxide (MAG-OX) 400 MG tablet Take 400 mg by mouth daily as needed (leg cramps). 01/13/21   [provider]  metoprolol tartrate (LOPRESSOR) 25 MG tablet TAKE 1 TABLET BY MOUTH TWICE DAILY 09/30/21   [name], MD  montelukast (SINGULAIR) 10 MG tablet Take 10 mg by mouth at bedtime. 08/14/19   [provider]  nitroGLYCERIN (NITROSTAT) 0.4 MG SL tablet Place 1 tablet (0.4 mg total) under the tongue every 5 (five) minutes as needed for chest pain. 07/22/21   Loel Dubonnet, NP  phenazopyridine (PYRIDIUM) 200 MG tablet Take 1 tablet (200 mg total) by mouth 3 (three) times daily as needed for pain. 08/10/20   [name], MD  rosuvastatin (CRESTOR) 10 MG tablet Take 1 tablet (10 mg total)  by mouth at bedtime. 08/26/18   [name], MD      Allergies    Latex    Review of Systems   Review of Systems  All other systems reviewed and are negative.   Physical Exam Updated Vital Signs BP 127/67   Pulse 70   Temp 99.5 F (37.5 C) (Oral)   Resp (!) 21   Ht '4\' 11"'$  (1.499 m)   Wt 91.6 kg   SpO2 97%   BMI 40.80 kg/m  Physical Exam Vitals and nursing note reviewed.  Constitutional:      General: She is not in acute distress.    Appearance: She is well-developed. She is not diaphoretic.  HENT:     Head: Normocephalic and atraumatic.   Cardiovascular:     Rate and Rhythm: Normal rate and regular rhythm.     Heart sounds: No murmur heard.    No friction rub. No gallop.  Pulmonary:     Effort: Pulmonary effort is normal. No respiratory distress.     Breath sounds: Normal breath sounds. No wheezing.     Comments: There is tenderness to palpation over the right upper chest wall that seems to reproduce her symptoms. Abdominal:     General: Bowel sounds are normal. There is no distension.     Palpations: Abdomen is soft.     Tenderness: There is no abdominal tenderness.  Musculoskeletal:        General: Normal range of motion.     Cervical back: Normal range of motion and neck supple.  Skin:    General: Skin is warm and dry.  Neurological:     General: No focal deficit present.     Mental Status: She is alert and oriented to person, place, and time.     ED Results / Procedures / Treatments   Labs (all labs ordered are listed, but only abnormal results are displayed) Labs Reviewed  BASIC METABOLIC PANEL - Abnormal; Notable for the following components:      Result Value   Sodium 133 (*)    Glucose, Bld 104 (*)    All other components within normal limits  RESP PANEL BY RT-PCR (RSV, FLU A&B, COVID)  RVPGX2  CBC  TROPONIN I (HIGH SENSITIVITY)  TROPONIN I (HIGH SENSITIVITY)    EKG EKG Interpretation  Date/Time:  Sunday October 11 2022 23:40:29 EST Ventricular Rate:  76 PR Interval:  152 QRS Duration: 86 QT Interval:  365 QTC Calculation: 411 R Axis:   29 Text Interpretation: Sinus rhythm Abnormal R-wave progression, early transition No significant change since 07/01/2022 Confirmed by Veryl Speak [phone] on 10/11/2022 11:43:44 PM  Radiology DG Chest 2 View  Result Date: 10/12/2022 CLINICAL DATA:  Right-sided chest pain radiating to the right shoulder EXAM: CHEST - 2 VIEW COMPARISON:  Radiographs 07/01/2022 FINDINGS: Stable elevation of the right hemidiaphragm. Stable cardiomediastinal silhouette.  Sternotomy. CABG. Low lung volumes accentuate pulmonary vascularity. No focal consolidation, pleural effusion, or pneumothorax. Surgical clips right axilla. IMPRESSION: No active cardiopulmonary disease. Chronic elevation of the right hemidiaphragm. Electronically Signed   By: Placido Sou M.D.   On: 10/12/2022 00:02    Procedures Procedures    Medications Ordered in ED Medications - No data to display  ED Course/ Medical Decision Making/ A&P  Patient is a 72 year old female with history of coronary artery disease with prior CABG.  Patient presenting today with complaints of pain in her right chest and shoulder.  This has been worsening since  this morning.  She denies any injury or trauma.  She arrives here with pain that is reproducible with palpation and movement.  Vital signs are stable with no hypoxia and no fever.  Workup initiated including CBC, metabolic panel, and troponin.  These were all unremarkable.  Chest x-ray shows no acute process.  EKG shows no changes from prior studies.  At this point, patient seems to be feeling better after receiving Zofran.  As mentioned above, her symptoms are reproducible and workup is unremarkable despite nearly 24 hours of symptoms.  She has no EKG changes and negative troponin.  I feel as though patient can safely be discharged with what I believed to be musculoskeletal chest pain.  I doubt ACS, aortic dissection, pneumothorax, or other emergent pathology.  Final Clinical Impression(s) / ED Diagnoses Final diagnoses:  None    Rx / DC Orders ED Discharge Orders     None         Veryl Speak, MD 10/12/22 [phone]

## 2022-10-12 NOTE — ED Notes (Signed)
Reviewed AVS/discharge instruction with patient. Time allotted for and all questions answered. Patient is agreeable for d/c and escorted to ed exit by staff.  

## 2022-10-12 NOTE — Progress Notes (Signed)
RT unable to obtain IV access with ultrasound.

## 2022-10-12 NOTE — Discharge Instructions (Signed)
Take ibuprofen 600 mg every 6 hours as needed for pain.  Rest.  Follow-up with your primary doctor if symptoms are not improving in the next few days, and return to the ER if symptoms significantly worsen or change.

## 2022-10-15 ENCOUNTER — Ambulatory Visit: Payer: 59 | Attending: Cardiology | Admitting: Cardiology

## 2022-10-15 ENCOUNTER — Encounter: Payer: Self-pay | Admitting: *Deleted

## 2022-10-15 ENCOUNTER — Encounter: Payer: Self-pay | Admitting: Cardiology

## 2022-10-15 VITALS — BP 100/70 | HR 84 | Ht <= 58 in | Wt 192.0 lb

## 2022-10-15 DIAGNOSIS — Z951 Presence of aortocoronary bypass graft: Secondary | ICD-10-CM | POA: Diagnosis not present

## 2022-10-15 DIAGNOSIS — I10 Essential (primary) hypertension: Secondary | ICD-10-CM

## 2022-10-15 DIAGNOSIS — I25118 Atherosclerotic heart disease of native coronary artery with other forms of angina pectoris: Secondary | ICD-10-CM

## 2022-10-15 DIAGNOSIS — R072 Precordial pain: Secondary | ICD-10-CM

## 2022-10-15 DIAGNOSIS — I209 Angina pectoris, unspecified: Secondary | ICD-10-CM | POA: Diagnosis not present

## 2022-10-15 NOTE — Patient Instructions (Signed)
Medication Instructions:  The current medical regimen is effective;  continue present plan and medications.  *If you need a refill on your cardiac medications before your next appointment, please call your pharmacy*  Testing/Procedures: Your physician has requested that you have a lexiscan myoview. For further information please visit [email or web address]. Please follow instruction sheet, as given.  Follow-Up: At [facility], you and your health needs are our priority.  As part of our continuing mission to provide you with exceptional heart care, we have created designated Provider Care Teams.  These Care Teams include your primary Cardiologist (physician) and Advanced Practice Providers (APPs -  Physician Assistants and Nurse Practitioners) who all work together to provide you with the care you need, when you need it.  We recommend signing up for the patient portal called "MyChart".  Sign up information is provided on this After Visit Summary.  MyChart is used to connect with patients for Virtual Visits (Telemedicine).  Patients are able to view lab/test results, encounter notes, upcoming appointments, etc.  Non-urgent messages can be sent to your provider as well.   To learn more about what you can do with MyChart, go to [email or web address].    Your next appointment:   1 year(s)  Provider:   Candee Furbish, MD

## 2022-10-15 NOTE — Progress Notes (Signed)
Cardiology Office Note:    Date:  10/15/2022   ID:  Patricia Cuevas, DOB 05/13/1951, MRN [id number]  PCP:  [name], MD  [facility] Cardiologist:  Candee Furbish, MD  [facility] Electrophysiologist:  None   Referring MD: [name], MD     History of Present Illness:    Patricia Cuevas is a 72 y.o. female here for the follow-up of CAD, chest pain.  CABG 2007 Cardiac catheterization 2011 Cardiac catheterization 2017: Patent LIMA to LAD small caliber vessel distally diffuse plaque with SVG patent to OM and OM 2.  Patent SVG to PDA and PLA, occluded SVG to diagonal.  She gets the majority of her care in the emergency department. Abdominal pain for instance.  Has been recently for chest pain.  Note reviewed.  EKG today unchanged from prior.  She recently lost her daughter unfortunately.  She has 3 children she is taking care of.  16, 17 and 19.  The 73 year old boy has graduated and has a job.  72 year old had an incident where she snuck out of the house at 2 AM and was found by the police at [place] on [address].  She spent the night detained.     No fevers chills nausea vomiting.  Past Medical History:  Diagnosis Date   Alcohol abuse    in the past, resolved   Arthritis    Asthma    Breast cancer ([place])    Double mastectomy 2007; s/p tamoxifen and arimidex therapy; no recurrence since 05/2008   CAD (coronary artery disease)    Nuclear stress test Feb 11, 2011, EF 64%, no scar or ischemia    //         catheterization, November, 2011,patent LIMA-LAD-small caliber distal vessel with diffuse plaque, patent SVG to OM1 and OM 2, patent SVG to PDA and PLA, occluded SVG to diagonal, medical therapy;  [place] 6/14:  Inferior thinning, no ischemia, EF 61%, low risk   CHF (congestive heart failure) (HCC)    Chronic back pain    Diabetes mellitus    Hx of colonic polyps    Hyperlipidemia    Hypertension    Myocardial infarction (HCC)    Nausea & vomiting     hospitalization, November, 2011, stricture GE junction, questionable stenosis gastrojejunostomy, disruptive primary peristaltic wave, GE reflux, delayed emptying proximal gastric pouch   Overweight(278.02)    stomach stapling 1985 followed by weight loss, then return of weight   Sciatica    Sleep apnea    CPAP being arranged May, 2011//does not use CPAP   Tobacco abuse    in the past, resolved   UTI (urinary tract infection)     Past Surgical History:  Procedure Laterality Date   [place] N/A 12/20/2015   Procedure: Left Heart Cath and Cors/Grafts Angiography;  Surgeon: Leonie Man, MD;  Location: [facility];  Service: Cardiovascular;  Laterality: N/A;   CORONARY ARTERY BYPASS GRAFT  2009   CORONARY STENT PLACEMENT     GASTRIC BYPASS  1983   open.  [place]   MASTECTOMY Bilateral 1987    Current Medications: Current Meds  Medication Sig   albuterol (PROVENTIL HFA;VENTOLIN HFA) 108 (90 BASE) MCG/ACT inhaler Inhale 2 puffs into the lungs every 6 (six) hours as needed for shortness of breath.   aspirin EC 81 MG tablet Take 81 mg by mouth daily.    benzonatate (TESSALON) 100 MG  capsule Take 1 capsule (100 mg total) by mouth every 8 (eight) hours.   cetirizine (ZYRTEC) 10 MG tablet Take 1 tablet (10 mg total) by mouth daily. (Patient taking differently: Take 10 mg by mouth daily as needed for allergies.)   cholecalciferol (VITAMIN D3) 25 MCG (1000 UT) tablet Take 1,000 Units by mouth daily.   diclofenac Sodium (VOLTAREN) 1 % GEL Apply 4 g topically 4 (four) times daily.   fluticasone (FLONASE) 50 MCG/ACT nasal spray Place 2 sprays into both nostrils daily. (Patient taking differently: Place 2 sprays into both nostrils daily as needed for allergies.)   glipiZIDE (GLUCOTROL) 10 MG tablet Take 10 mg by mouth every morning.   HYDROcodone-acetaminophen (NORCO) 10-325 MG tablet Take 1 tablet by mouth 2 (two) times daily as needed.    latanoprost (XALATAN) 0.005 % ophthalmic solution Place 1 drop into both eyes at bedtime as needed (dry eyes).   lisinopril-hydrochlorothiazide (PRINZIDE,ZESTORETIC) 20-12.5 MG per tablet Take 1 tablet by mouth daily.   magnesium oxide (MAG-OX) 400 MG tablet Take 400 mg by mouth daily as needed (leg cramps).   metoprolol tartrate (LOPRESSOR) 25 MG tablet TAKE 1 TABLET BY MOUTH TWICE DAILY   nitroGLYCERIN (NITROSTAT) 0.4 MG SL tablet Place 1 tablet (0.4 mg total) under the tongue every 5 (five) minutes as needed for chest pain.   phenazopyridine (PYRIDIUM) 200 MG tablet Take 1 tablet (200 mg total) by mouth 3 (three) times daily as needed for pain.   rosuvastatin (CRESTOR) 10 MG tablet Take 1 tablet (10 mg total) by mouth at bedtime.     Allergies:   Latex   Social History   Socioeconomic History   Marital status: Single    Spouse name: Not on file   Number of children: Not on file   Years of education: Not on file   Highest education level: Not on file  Occupational History   Occupation: Disabled  Tobacco Use   Smoking status: Former    Packs/day: 3.00    Years: 27.00    Total pack years: 81.00    Types: Cigarettes    Quit date: 09/14/2000    Years since quitting: 22.0   Smokeless tobacco: Never  Vaping Use   Vaping Use: Never used  Substance and Sexual Activity   Alcohol use: No   Drug use: No   Sexual activity: Not on file  Other Topics Concern   Not on file  Social History Narrative   Not on file   Social Determinants of Health   Financial Resource Strain: Not on file  Food Insecurity: Not on file  Transportation Needs: Not on file  Physical Activity: Not on file  Stress: Not on file  Social Connections: Not on file     Family History: The patient's family history includes Asthma in her sister; Colon cancer in her father; Colon cancer (age of onset: 6) in her sister; Diabetes in her mother, sister, and sister; Heart disease in her father, mother, and sister;  Hypertension in her mother; Stomach cancer in her sister. There is no history of Esophageal cancer or Rectal cancer.  ROS:   Please see the history of present illness.     All other systems reviewed and are negative.  EKGs/Labs/Other Studies Reviewed:    The following studies were reviewed today:   Cath 12/20/15: Prox RCA to Mid RCA lesion, 55% stenosed. Mid RCA to Dist RCA lesion, 100% stenosed. Mild disease of the Ostial-Prox SVG-dRCA(filling PLA & PDA). Mid LAD lesion,  100% stenosed. LIMA-LAD is patent, but very small to a small distal LAD. Very tortuous graft - not a good option to attempt PCI through. Prox Cx to Mid Cx lesion, 100% stenosed. The lesion was previously treated with a stent (unknown type) greater than two years ago. Mid Cx lesion, 100% stenosed. Patent SVG-OM2 & OM3. Origin to Prox SVG-Diag Graft lesion, 100% stenosed. Known. Sequential SVG-OM2-OM3 was injected is large & widely patent. The left ventricular systolic function is normal.    EKG: EKG on 12/02/2019-sinus rhythm nonspecific ST changes.  Excellent.  Recent Labs: 09/25/2022: ALT 15 10/12/2022: BUN 11; Creatinine, Ser 0.77; Hemoglobin 13.5; Platelets 208; Potassium 3.8; Sodium 133  Recent Lipid Panel    Component Value Date/Time   CHOL 158 11/15/2018 1550   TRIG 138 11/15/2018 1550   HDL 46 11/15/2018 1550   CHOLHDL 3.4 11/15/2018 1550   CHOLHDL 3.4 05/17/2009 0130   VLDL 19 05/17/2009 0130   LDLCALC 84 11/15/2018 1550     Risk Assessment/Calculations:       Physical Exam:    VS:  BP 100/70   Pulse 84   Ht '4\' 10"'$  (1.473 m)   Wt 192 lb (87.1 kg)   SpO2 99%   BMI 40.13 kg/m     Wt Readings from Last 3 Encounters:  10/15/22 192 lb (87.1 kg)  10/11/22 202 lb (91.6 kg)  09/25/22 200 lb (90.7 kg)     GEN: Well nourished, well developed, in no acute distress HEENT: normal Neck: no JVD, carotid bruits, or masses Cardiac: RRR; no murmurs, rubs, or gallops,no edema, CABG scar Respiratory:   clear to auscultation bilaterally, normal work of breathing GI: soft, nontender, nondistended, + BS MS: no deformity or atrophy Skin: warm and dry, no rash Neuro:  Alert and Oriented x 3, Strength and sensation are intact Psych: euthymic mood, full affect   ASSESSMENT:    1. Precordial pain   2. Coronary artery disease of native artery of native heart with stable angina pectoris ([place])   3. Angina, class III ([place])   4. Hx of CABG   5. Primary hypertension    PLAN:    In order of problems listed above:  Coronary artery disease/angina -2017 cardiac catheterization reassuring medical management no change in anatomy from 2011. -It has been since 2017 since last stress test.  Since she has been to the emergency room a few times with chest discomfort, I think it makes sense for [place] to go ahead and proceed with Lexiscan stress test.  Troponins were normal at last check.  History of CABG bypass surgery 2007 -Continue with secondary risk factor prevention.  No changes made to medical management  Hyperlipidemia -Crestor 10 mg.  Continue with goal LDL less than 70.  Last check 84 in our records.  PCP may have more recent lab values.  Morbid obesity -Continue to encourage weight loss.  Peak weight previously was 525 pounds.  Doing great.  She is now down to 192.  Angina -Metoprolol. More tired with walking. Racing HR when stopped.  Okay to continue this medication.  Other labs include recent hemoglobin of 13.2 creatinine is 0.79.  ER visits reviewed       Medication Adjustments/Labs and Tests Ordered: Current medicines are reviewed at length with the patient today.  Concerns regarding medicines are outlined above.  No orders of the defined types were placed in this encounter.  No orders of the defined types were placed in this encounter.   Patient  Instructions  Medication Instructions:  The current medical regimen is effective;  continue present plan and medications.  *If you  need a refill on your cardiac medications before your next appointment, please call your pharmacy*  Testing/Procedures: Your physician has requested that you have a lexiscan myoview. For further information please visit [email or web address]. Please follow instruction sheet, as given.  Follow-Up: At [facility], you and your health needs are our priority.  As part of our continuing mission to provide you with exceptional heart care, we have created designated Provider Care Teams.  These Care Teams include your primary Cardiologist (physician) and Advanced Practice Providers (APPs -  Physician Assistants and Nurse Practitioners) who all work together to provide you with the care you need, when you need it.  We recommend signing up for the patient portal called "MyChart".  Sign up information is provided on this After Visit Summary.  MyChart is used to connect with patients for Virtual Visits (Telemedicine).  Patients are able to view lab/test results, encounter notes, upcoming appointments, etc.  Non-urgent messages can be sent to your provider as well.   To learn more about what you can do with MyChart, go to [email or web address].    Your next appointment:   1 year(s)  Provider:   Candee Furbish, MD        Signed, Candee Furbish, MD  10/15/2022 9:54 AM    [place]

## 2022-10-19 ENCOUNTER — Telehealth (HOSPITAL_COMMUNITY): Payer: Self-pay | Admitting: *Deleted

## 2022-10-19 NOTE — Telephone Encounter (Signed)
Patient given detailed instructions per Myocardial Perfusion Study Information Sheet for the test on 10/21/22 Patient notified to arrive 15 minutes early and that it is imperative to arrive on time for appointment to keep from having the test rescheduled.  If you need to cancel or reschedule your appointment, please call the office within 24 hours of your appointment. . Patient verbalized understanding. Kirstie Peri, RN

## 2022-10-21 ENCOUNTER — Ambulatory Visit (HOSPITAL_COMMUNITY): Payer: 59 | Attending: Cardiology

## 2022-10-21 DIAGNOSIS — R072 Precordial pain: Secondary | ICD-10-CM

## 2022-10-21 LAB — MYOCARDIAL PERFUSION IMAGING
LV dias vol: 44 mL (ref 46–106)
LV sys vol: 10 mL
Nuc Stress EF: 76 %
Peak HR: 84 {beats}/min
Rest HR: 57 {beats}/min
Rest Nuclear Isotope Dose: 10.8 mCi
SDS: 7
SRS: 0
SSS: 7
ST Depression (mm): 0 mm
Stress Nuclear Isotope Dose: 32.9 mCi
TID: 0.94

## 2022-10-21 MED ORDER — TECHNETIUM TC 99M TETROFOSMIN IV KIT
10.8000 | PACK | Freq: Once | INTRAVENOUS | Status: AC | PRN
  Administered 2022-10-21: 10.8 via INTRAVENOUS

## 2022-10-21 MED ORDER — REGADENOSON 0.4 MG/5ML IV SOLN
0.4000 mg | Freq: Once | INTRAVENOUS | Status: AC
  Administered 2022-10-21: 0.4 mg via INTRAVENOUS

## 2022-10-21 MED ORDER — TECHNETIUM TC 99M TETROFOSMIN IV KIT
32.9000 | PACK | Freq: Once | INTRAVENOUS | Status: AC | PRN
  Administered 2022-10-21: 32.9 via INTRAVENOUS

## 2022-10-26 ENCOUNTER — Encounter: Payer: Self-pay | Admitting: *Deleted

## 2022-10-26 MED ORDER — ISOSORBIDE MONONITRATE ER 30 MG PO TB24: 30.0000 mg | ORAL_TABLET | Freq: Every day | ORAL | 3 refills | Status: AC

## 2022-10-26 NOTE — Progress Notes (Unsigned)
Pt aware of results and to start Isosorbide 30 mg a day.   RX sent into [facility] on [address], [place], [place].  F/u appt scheduled for 01/18/23 at 1:30 pm.

## 2022-12-01 ENCOUNTER — Other Ambulatory Visit: Payer: Self-pay

## 2022-12-01 ENCOUNTER — Emergency Department (HOSPITAL_BASED_OUTPATIENT_CLINIC_OR_DEPARTMENT_OTHER): Payer: 59

## 2022-12-01 ENCOUNTER — Emergency Department (HOSPITAL_BASED_OUTPATIENT_CLINIC_OR_DEPARTMENT_OTHER)
Admission: EM | Admit: 2022-12-01 | Discharge: 2022-12-01 | Disposition: A | Discharge status: Home / Self Care | Payer: 59 | Attending: Emergency Medicine | Admitting: Emergency Medicine

## 2022-12-01 ENCOUNTER — Encounter (HOSPITAL_BASED_OUTPATIENT_CLINIC_OR_DEPARTMENT_OTHER): Payer: Self-pay | Admitting: Emergency Medicine

## 2022-12-01 DIAGNOSIS — N3000 Acute cystitis without hematuria: Secondary | ICD-10-CM | POA: Insufficient documentation

## 2022-12-01 DIAGNOSIS — R319 Hematuria, unspecified: Secondary | ICD-10-CM | POA: Diagnosis present

## 2022-12-01 LAB — URINALYSIS, W/ REFLEX TO CULTURE (INFECTION SUSPECTED)
Bilirubin Urine: NEGATIVE
Glucose, UA: NEGATIVE mg/dL
Ketones, ur: NEGATIVE mg/dL
Nitrite: NEGATIVE
Protein, ur: 30 mg/dL — AB
Specific Gravity, Urine: 1.015 (ref 1.005–1.030)
pH: 6.5 (ref 5.0–8.0)

## 2022-12-01 LAB — CBC WITH DIFFERENTIAL/PLATELET
Abs Immature Granulocytes: 0.03 10*3/uL (ref 0.00–0.07)
Basophils Absolute: 0 10*3/uL (ref 0.0–0.1)
Basophils Relative: 0 %
Eosinophils Absolute: 0 10*3/uL (ref 0.0–0.5)
Eosinophils Relative: 0 %
HCT: 41 % (ref 36.0–46.0)
Hemoglobin: 12.7 g/dL (ref 12.0–15.0)
Immature Granulocytes: 0 %
Lymphocytes Relative: 35 %
Lymphs Abs: 2.5 10*3/uL (ref 0.7–4.0)
MCH: 27.4 pg (ref 26.0–34.0)
MCHC: 31 g/dL (ref 30.0–36.0)
MCV: 88.6 fL (ref 80.0–100.0)
Monocytes Absolute: 0.7 10*3/uL (ref 0.1–1.0)
Monocytes Relative: 9 %
Neutro Abs: 3.9 10*3/uL (ref 1.7–7.7)
Neutrophils Relative %: 56 %
Platelets: 276 10*3/uL (ref 150–400)
RBC: 4.63 MIL/uL (ref 3.87–5.11)
RDW: 15.8 % — ABNORMAL HIGH (ref 11.5–15.5)
WBC: 7.2 10*3/uL (ref 4.0–10.5)
nRBC: 0 % (ref 0.0–0.2)

## 2022-12-01 LAB — BASIC METABOLIC PANEL
Anion gap: 14 (ref 5–15)
BUN: <5 mg/dL — ABNORMAL LOW (ref 8–23)
CO2: 12 mmol/L — ABNORMAL LOW (ref 22–32)
Calcium: 6.7 mg/dL — ABNORMAL LOW (ref 8.9–10.3)
Chloride: 104 mmol/L (ref 98–111)
Creatinine, Ser: <0.3 mg/dL — ABNORMAL LOW (ref 0.44–1.00)
Glucose, Bld: 57 mg/dL — ABNORMAL LOW (ref 70–99)
Potassium: 3.9 mmol/L (ref 3.5–5.1)
Sodium: 130 mmol/L — ABNORMAL LOW (ref 135–145)

## 2022-12-01 MED ORDER — FENTANYL CITRATE PF 50 MCG/ML IJ SOSY
50.0000 ug | PREFILLED_SYRINGE | Freq: Once | INTRAMUSCULAR | Status: AC
  Administered 2022-12-01: 50 ug via INTRAVENOUS
  Filled 2022-12-01: qty 1

## 2022-12-01 MED ORDER — LACTATED RINGERS IV BOLUS
1000.0000 mL | Freq: Once | INTRAVENOUS | Status: AC
  Administered 2022-12-01: 1000 mL via INTRAVENOUS

## 2022-12-01 MED ORDER — ACETAMINOPHEN 500 MG PO TABS
1000.0000 mg | ORAL_TABLET | Freq: Once | ORAL | Status: AC
  Administered 2022-12-01: 1000 mg via ORAL
  Filled 2022-12-01: qty 2

## 2022-12-01 MED ORDER — CEPHALEXIN 500 MG PO CAPS: 500.0000 mg | ORAL_CAPSULE | Freq: Three times a day (TID) | ORAL | 0 refills | Status: DC

## 2022-12-01 NOTE — ED Provider Notes (Signed)
[facility] Provider Note   CSN: [id number] Arrival date & time: 12/01/22  1135     History Chief Complaint  Patient presents with   Hematuria    HPI Patricia Cuevas is a 72 y.o. female presenting for left-sided flank pain over the last 24 hours.  States that starting to develop hematuria and worsening left groin pain now.  Endorses a history of nephrolithiasis.  Denies fevers chills nausea vomiting syncope shortness of breath.  She otherwise ambulatory tolerating p.o. intake.  No medications prior to arrival..   Patient's recorded medical, surgical, social, medication list and allergies were reviewed in the Snapshot window as part of the initial history.   Review of Systems   Review of Systems  Constitutional:  Negative for chills and fever.  HENT:  Negative for ear pain and sore throat.   Eyes:  Negative for pain and visual disturbance.  Respiratory:  Negative for cough and shortness of breath.   Cardiovascular:  Negative for chest pain and palpitations.  Gastrointestinal:  Negative for abdominal pain and vomiting.  Genitourinary:  Positive for flank pain and hematuria. Negative for dysuria.  Musculoskeletal:  Negative for arthralgias and back pain.  Skin:  Negative for color change and rash.  Neurological:  Negative for seizures and syncope.  All other systems reviewed and are negative.   Physical Exam Updated Vital Signs BP (!) 153/79   Pulse 82   Temp 98.4 F (36.9 C)   Resp 20   Wt 91.6 kg   SpO2 95%   BMI 42.22 kg/m  Physical Exam Vitals and nursing note reviewed.  Constitutional:      General: She is not in acute distress.    Appearance: She is well-developed.  HENT:     Head: Normocephalic and atraumatic.  Eyes:     Conjunctiva/sclera: Conjunctivae normal.  Cardiovascular:     Rate and Rhythm: Normal rate and regular rhythm.     Heart sounds: No murmur heard. Pulmonary:     Effort: Pulmonary effort is  normal. No respiratory distress.     Breath sounds: Normal breath sounds.  Abdominal:     General: There is no distension.     Palpations: Abdomen is soft.     Tenderness: There is abdominal tenderness (Left lower quadrant tenderness). There is no right CVA tenderness or left CVA tenderness.  Musculoskeletal:        General: No swelling or tenderness. Normal range of motion.     Cervical back: Neck supple.  Skin:    General: Skin is warm and dry.  Neurological:     General: No focal deficit present.     Mental Status: She is alert and oriented to person, place, and time. Mental status is at baseline.     Cranial Nerves: No cranial nerve deficit.      ED Course/ Medical Decision Making/ A&P Clinical Course as of 12/01/22 1357  Tue Dec 01, 2022  1312 CT Renal Stone Study [CC]  1351 Sodium(!): 130 [CC]  0000000 Basic metabolic panel(!) This lab likely represents collection off of the line with the lactated Ringer's running given osmotic and laboratory evaluation consistent with buffered lactated Ringer's at sodium and potassium levels identified here.  Unlikely to be systemic blood work. Offered repeat blood work to patient however she feels symptomatically comfortable with outpatient care management UTI treatment and does not want to stay in the emergency department for repeat lab work.. [CC]  Clinical Course User Index [CC] Tretha Sciara, MD    Procedures Procedures   Medications Ordered in ED Medications  lactated ringers bolus 1,000 mL (0 mLs Intravenous Stopped 12/01/22 1327)  acetaminophen (TYLENOL) tablet 1,000 mg (1,000 mg Oral Given 12/01/22 1217)  fentaNYL (SUBLIMAZE) injection 50 mcg (50 mcg Intravenous Given 12/01/22 1215)   Medical Decision Making:   Patricia Cuevas is a 72 y.o. female who presented to the ED today with left sided flank pain, detailed above.    Additional history discussed with patient's family/caregivers.  Patient's presentation is complicated  by their history of advanced age, multiple comorbid medical conditions.  Patient placed on continuous vitals and telemetry monitoring while in ED which was reviewed periodically.  Complete initial physical exam performed, notably the patient  was hemodynamically stable in no acute distress.     Reviewed and confirmed nursing documentation for past medical history, family history, social history.    Initial Assessment:   With the patient's presentation of flank pain, most likely diagnosis is Urologic pathology (including UL vs UTI vs pyelo) vs MSK etiology. Other diagnoses were considered including (but not limited to) gastroenteritis, colitis, small bowel obstruction, appendicitis, cholecystitis, pancreatitis. These are considered less likely due to history of present illness and physical exam findings.   This is most consistent with an acute life/limb threatening illness complicated by underlying chronic conditions.   Initial Plan:  CBC/ Metabolic Panel  to evaluate for underlying infectious/metabolic etiology for patient's abdominal pain  CT Ab/pelvis without contrast due to favored urologic over GI etiology for patient's abdominal pain  Urinalysis and repeat physical assessment to evaluate for UTI/Pyelonpehritis  Empiric management of symptoms with escalating pain control and antiemetics as needed.   Initial Study Results:   Laboratory  All laboratory results reviewed without evidence of clinically relevant pathology.   Exceptions include: Bacteriuria and hematuria. See ED course for CMP results   EKG EKG was reviewed independently. Rate, rhythm, axis, intervals all examined and without medically relevant abnormality. ST segments without concerns for elevations.    Radiology All images reviewed independently. Agree with radiology report at this time.   CT Renal Stone Study  Result Date: 12/01/2022 CLINICAL DATA:  Abdominal/flank pain.  Stone suspected. EXAM: CT ABDOMEN AND PELVIS  WITHOUT CONTRAST TECHNIQUE: Multidetector CT imaging of the abdomen and pelvis was performed following the standard protocol without IV contrast. RADIATION DOSE REDUCTION: This exam was performed according to the departmental dose-optimization program which includes automated exposure control, adjustment of the mA and/or kV according to patient size and/or use of iterative reconstruction technique. COMPARISON:  09/26/2022 neck pain FINDINGS: Lower chest: Chronic elevation of the right hemidiaphragm with chronic volume loss at the right lung base. Newly seen patchy infiltrate in the inferior right upper lobe consistent with bronchopneumonia. Hepatobiliary: Liver parenchyma is normal without contrast. Small calcified gallstone without CT evidence of cholecystitis or obstruction. Pancreas: Normal Spleen: Normal Adrenals/Urinary Tract: Adrenal glands are normal. Kidneys are normal. No cyst, mass, stone or hydronephrosis. Bladder is normal. Stomach/Bowel: Previous bariatric surgery. No acute abnormality seen affecting the stomach, small intestine or colon. Vascular/Lymphatic: Aortic atherosclerosis. No aneurysm. IVC is normal. No adenopathy. Reproductive: Previous hysterectomy.  No pelvic mass. Other: No free fluid or air. Musculoskeletal: Ordinary spinal degenerative changes are present. IMPRESSION: 1. No acute abdominal finding seen to explain the clinical presentation. No evidence of urinary tract stone disease or other urinary tract pathology. 2. Chronic elevation of the right hemidiaphragm with chronic volume loss at  the right lung base. Newly seen patchy infiltrate in the inferior right upper lobe consistent with bronchopneumonia. 3. Small calcified gallstone without CT evidence of cholecystitis or obstruction. 4. Previous bariatric surgery. No acute bowel finding. 5. Aortic atherosclerosis. Aortic Atherosclerosis (ICD10-I70.0). Electronically Signed   By: Nelson Chimes M.D.   On: 12/01/2022 12:16    Final  Reassessment and Plan:   Evidence of UTI on exam.  Will treat with Keflex 3 times daily for the next 5 days and have patient follow-up with her primary care provider to ensure urinary clearance and urine culture results.  Patient expressed understanding feels comfortable outpatient care management does not want to stay for repeat BMP and patient discharged with no further acute events.  Disposition:  I have considered need for hospitalization, however, considering all of the above, I believe this patient is stable for discharge at this time.  Patient/family educated about specific return precautions for given chief complaint and symptoms.  Patient/family educated about follow-up with PCP.     Patient/family expressed understanding of return precautions and need for follow-up. Patient spoken to regarding all imaging and laboratory results and appropriate follow up for these results. All education provided in verbal form with additional information in written form. Time was allowed for answering of patient questions. Patient discharged.    Emergency Department Medication Summary:   Medications  lactated ringers bolus 1,000 mL (0 mLs Intravenous Stopped 12/01/22 1327)  acetaminophen (TYLENOL) tablet 1,000 mg (1,000 mg Oral Given 12/01/22 1217)  fentaNYL (SUBLIMAZE) injection 50 mcg (50 mcg Intravenous Given 12/01/22 1215)             Clinical Impression:  1. Acute cystitis without hematuria      Discharge   Final Clinical Impression(s) / ED Diagnoses Final diagnoses:  Acute cystitis without hematuria    Rx / DC Orders ED Discharge Orders          Ordered    cephALEXin (KEFLEX) 500 MG capsule  3 times daily        12/01/22 1357              Tretha Sciara, MD 12/01/22 1357

## 2022-12-01 NOTE — ED Notes (Signed)
Discharge paperwork reviewed entirely with patient, including Rx's and follow up care. Pain was under control. Pt verbalized understanding as well as all parties involved. No questions or concerns voiced at the time of discharge. No acute distress noted.   Pt ambulated out to PVA without incident or assistance.  

## 2022-12-01 NOTE — ED Notes (Signed)
Patient ambulatory to [facility]

## 2022-12-01 NOTE — ED Triage Notes (Signed)
Hematuria and left lower abd pain , dysuria . Hx kidney stone.

## 2022-12-03 LAB — URINE CULTURE: Culture: >=100000 — AB

## 2022-12-04 ENCOUNTER — Telehealth (HOSPITAL_BASED_OUTPATIENT_CLINIC_OR_DEPARTMENT_OTHER): Payer: Self-pay | Admitting: *Deleted

## 2022-12-04 NOTE — Telephone Encounter (Signed)
Post ED Visit - Positive Culture Follow-up  Culture report reviewed by antimicrobial stewardship pharmacist: [place] Team []  Elenor Quinones, Pharm.D. []  [name], Pharm.D., BCPS AQ-ID []  Parks Neptune, Pharm.D., BCPS []  Alycia Rossetti, Pharm.D., BCPS []  [place], Pharm.D., BCPS, AAHIVP []  Legrand Como, Pharm.D., BCPS, AAHIVP []  Salome Arnt, PharmD, BCPS []  Johnnette Gourd, PharmD, BCPS []  Hughes Better, PharmD, BCPS []  Leeroy Cha, PharmD []  Laqueta Linden, PharmD, BCPS []  Albertina Parr, PharmD  [facility] Team []  Leodis Sias, PharmD []  Lindell Spar, PharmD []  Royetta Asal, PharmD []  Graylin Shiver, Rph []  Rema Fendt) Glennon Mac, PharmD []  Arlyn Dunning, PharmD []  Netta Cedars, PharmD []  Dia Sitter, PharmD []  Leone Haven, PharmD []  Gretta Arab, PharmD []  Theodis Shove, PharmD []  Peggyann Juba, PharmD []  Reuel Boom, PharmD   Positive urine culture Treated with Cephalexin, organism sensitive to the same and no further patient follow-up is required at this time.  Dewayne Hatch, PharmD  Harlon Flor Talley 12/04/2022, 10:27 AM

## 2023-01-16 NOTE — Progress Notes (Deleted)
Cardiology Office Note:    Date:  01/16/2023   ID:  Patricia Cuevas, DOB March 22, 1951, MRN [id number]  PCP:  [name], MD   [facility] Providers Cardiologist:  Donato Schultz, MD { Click to update primary MD,subspecialty MD or APP then REFRESH:1}    Referring MD: [name], MD   Chief Complaint: ***  History of Present Illness:    Patricia Cuevas is a *** 72 y.o. female with a hx of CAD s/p CABG 2007, breast cancer s/p double mastectomy 2007, HTN, HLD,   History of CABG 2007 with cardiac catheterization 2011 and again in 2017 which revealed patent LIMA to LAD, small caliber vessel distally diffuse plaque with SVG patent to OM and OM 2, patent SVG to PDA and PLA, occluded SVG to diagonal, no significant change in anatomy from 2011 cath, no culprit lesion to explain abnormal stress test, normal LVEF.   Last cardiology clinic visit was 10/15/2022 with Dr. [name].  She reported restent occurrence of chest pain.  Frequent ED visits for variety of symptoms, gets most of her care in the ED. she had recently lost her daughter.  Cares for 3 children ages 14, 57 and 46. No evidence of ACS in ED prior to office visit. She reported some fatigue with metoprolol but heart was racing when it was stopped. No medication changes were made. Nuclear stress test was ordered for evaluation of chest discomfort. Stress test unchanged fromprior test and she was advised to start Imdur 30 mg daily and return for follow-up in 3 months.  Today, she is here   Past Medical History:  Diagnosis Date   Alcohol abuse    in the past, resolved   Arthritis    Asthma    Breast cancer (HCC)    Double mastectomy 2007; s/p tamoxifen and arimidex therapy; no recurrence since 05/2008   CAD (coronary artery disease)    Nuclear stress test Feb 11, 2011, EF 64%, no scar or ischemia    //         catheterization, November, 2011,patent LIMA-LAD-small caliber distal vessel with diffuse plaque, patent SVG to OM1 and OM 2,  patent SVG to PDA and PLA, occluded SVG to diagonal, medical therapy;  Lexiscan Myoview 6/14:  Inferior thinning, no ischemia, EF 61%, low risk   CHF (congestive heart failure) (HCC)    Chronic back pain    Diabetes mellitus    Hx of colonic polyps    Hyperlipidemia    Hypertension    Myocardial infarction (HCC)    Nausea & vomiting    hospitalization, November, 2011, stricture GE junction, questionable stenosis gastrojejunostomy, disruptive primary peristaltic wave, GE reflux, delayed emptying proximal gastric pouch   Overweight(278.02)    stomach stapling 1985 followed by weight loss, then return of weight   Sciatica    Sleep apnea    CPAP being arranged May, 2011//does not use CPAP   Tobacco abuse    in the past, resolved   UTI (urinary tract infection)     Past Surgical History:  Procedure Laterality Date   ABDOMINAL HYSTERECTOMY  1989   CARDIAC CATHETERIZATION N/A 12/20/2015   Procedure: Left Heart Cath and Cors/Grafts Angiography;  Surgeon: Marykay Lex, MD;  Location: [facility] INVASIVE CV LAB;  Service: Cardiovascular;  Laterality: N/A;   CORONARY ARTERY BYPASS GRAFT  2009   CORONARY STENT PLACEMENT     GASTRIC BYPASS  1983   open.  [place]   MASTECTOMY Bilateral 1987  Current Medications: No outpatient medications have been marked as taking for the 01/18/23 encounter (Appointment) with Lissa Hoard, Zachary George, NP.     Allergies:   Latex   Social History   Socioeconomic History   Marital status: Single    Spouse name: Not on file   Number of children: Not on file   Years of education: Not on file   Highest education level: Not on file  Occupational History   Occupation: Disabled  Tobacco Use   Smoking status: Former    Packs/day: 3.00    Years: 27.00    Additional pack years: 0.00    Total pack years: 81.00    Types: Cigarettes    Quit date: 09/14/2000    Years since quitting: 22.3   Smokeless tobacco: Never  Vaping Use   Vaping Use: Never used  Substance  and Sexual Activity   Alcohol use: No   Drug use: No   Sexual activity: Not on file  Other Topics Concern   Not on file  Social History Narrative   Not on file   Social Determinants of Health   Financial Resource Strain: Not on file  Food Insecurity: Not on file  Transportation Needs: Not on file  Physical Activity: Not on file  Stress: Not on file  Social Connections: Not on file     Family History: The patient's ***family history includes Asthma in her sister; Colon cancer in her father; Colon cancer (age of onset: 5) in her sister; Diabetes in her mother, sister, and sister; Heart disease in her father, mother, and sister; Hypertension in her mother; Stomach cancer in her sister. There is no history of Esophageal cancer or Rectal cancer.  ROS:   Please see the history of present illness.    *** All other systems reviewed and are negative.  Labs/Other Studies Reviewed:    The following studies were reviewed today:  Lexiscan Myoview 10/21/22    Findings are consistent with no ischemia. The study is low risk.   No ST deviation was noted.   LV perfusion is abnormal. Defect 1: There is a medium defect with mild reduction in uptake present in the mid to basal inferior and inferolateral location(s) that is fixed. There is normal wall motion in the defect area.   Left ventricular function is normal. Nuclear stress EF: 76 %. The left ventricular ejection fraction is hyperdynamic (>65%). End diastolic cavity size is normal. End systolic cavity size is normal.   Prior study available for comparison from 12/13/2015. No changes compared to prior study.   There is a mild defect in the basal to mid inferior and inferolateral wall that improves with stress. This was seen on prior study as well, and cath done after that study did not find culprit lesion. Cannot determine if this is artifact vs. Hibernating myocardium based on available data. Would consider cardiac PET in the future if testing  needed.  LHC 12/20/15 Prox RCA to Mid RCA lesion, 55% stenosed. Mid RCA to Dist RCA lesion, 100% stenosed. Mild disease of the Ostial-Prox SVG-dRCA(filling PLA & PDA). Mid LAD lesion, 100% stenosed. LIMA-LAD is patent, but very small to a small distal LAD. Very tortuous graft - not a good option to attempt PCI through. Prox Cx to Mid Cx lesion, 100% stenosed. The lesion was previously treated with a stent (unknown type) greater than two years ago. Mid Cx lesion, 100% stenosed. Patent SVG-OM2 & OM3. Origin to Prox SVG-Diag Graft lesion, 100% stenosed. Known. Sequential SVG-OM2-OM3  was injected is large & widely patent. The left ventricular systolic function is normal.     No real change in anatomy from 2011 catheterization. No culprit lesion to explain the patient's stress test.   Normal EF with normal EDP and stable coronary arteries. She should be at low risk from a cardiovascular standpoint for her surgery from a revascularization standpoint.  Lexiscan Myoview 12/13/15 Nuclear stress EF: 63%. There was no ST segment deviation noted during stress. Defect 1: There is a medium defect of mild severity present in the basal inferolateral, mid inferior and mid inferolateral location. Findings consistent with ischemia. This is an intermediate risk study. The left ventricular ejection fraction is normal (55-65%).   There is medium size area mild severity ischemia in the basal and mid inferolateral and mid inferior walls (SDS 3).  Recent Labs: 09/25/2022: ALT 15 12/01/2022: BUN <5; Creatinine, Ser <0.30; Hemoglobin 12.7; Platelets 276; Potassium 3.9; Sodium 130  Recent Lipid Panel    Component Value Date/Time   CHOL 158 11/15/2018 1550   TRIG 138 11/15/2018 1550   HDL 46 11/15/2018 1550   CHOLHDL 3.4 11/15/2018 1550   CHOLHDL 3.4 05/17/2009 0130   VLDL 19 05/17/2009 0130   LDLCALC 84 11/15/2018 1550     Risk Assessment/Calculations:   {Does this patient have ATRIAL  FIBRILLATION?:[id number]}       Physical Exam:    VS:  There were no vitals taken for this visit.    Wt Readings from Last 3 Encounters:  12/01/22 202 lb (91.6 kg)  10/21/22 192 lb (87.1 kg)  10/15/22 192 lb (87.1 kg)     GEN: *** Well nourished, well developed in no acute distress HEENT: Normal NECK: No JVD; No carotid bruits CARDIAC: ***RRR, no murmurs, rubs, gallops RESPIRATORY:  Clear to auscultation without rales, wheezing or rhonchi  ABDOMEN: Soft, non-tender, non-distended MUSCULOSKELETAL:  No edema; No deformity. *** pedal pulses, ***bilaterally SKIN: Warm and dry NEUROLOGIC:  Alert and oriented x 3 PSYCHIATRIC:  Normal affect   EKG:  EKG is *** ordered today.  The ekg ordered today demonstrates ***  No BP recorded.  {Refresh Note OR Click here to enter BP  :1}***    Diagnoses:    No diagnosis found. Assessment and Plan:     CAD: s/p CABG 2007. Myoview 10/21/2022 revealed no evidence of ischemia.  She has a mild defect in the basal to mid inferior and inferior lateral wall that improves with stress seen on previous study as well, no culprit lesion on cath.  Hyperlipidemia LDL goal < 55: Hypertension:   {Are you ordering a CV Procedure (e.g. stress test, cath, DCCV, TEE, etc)?   Press F2        :[id number]}   Disposition:  Medication Adjustments/Labs and Tests Ordered: Current medicines are reviewed at length with the patient today.  Concerns regarding medicines are outlined above.  No orders of the defined types were placed in this encounter.  No orders of the defined types were placed in this encounter.   There are no Patient Instructions on file for this visit.   Signed, Levi Aland, NP  01/16/2023 5:12 PM    [facility]

## 2023-01-18 ENCOUNTER — Ambulatory Visit: Payer: 59 | Attending: Nurse Practitioner | Admitting: Nurse Practitioner

## 2023-07-27 ENCOUNTER — Emergency Department (HOSPITAL_COMMUNITY): Payer: 59

## 2023-07-27 ENCOUNTER — Emergency Department (HOSPITAL_COMMUNITY)
Admission: EM | Admit: 2023-07-27 | Discharge: 2023-07-27 | Disposition: A | Discharge status: Home / Self Care | Payer: 59 | Attending: Emergency Medicine | Admitting: Emergency Medicine

## 2023-07-27 DIAGNOSIS — Z91040 Latex allergy status: Secondary | ICD-10-CM | POA: Diagnosis not present

## 2023-07-27 DIAGNOSIS — I10 Essential (primary) hypertension: Secondary | ICD-10-CM | POA: Insufficient documentation

## 2023-07-27 DIAGNOSIS — E119 Type 2 diabetes mellitus without complications: Secondary | ICD-10-CM | POA: Diagnosis not present

## 2023-07-27 DIAGNOSIS — I25708 Atherosclerosis of coronary artery bypass graft(s), unspecified, with other forms of angina pectoris: Secondary | ICD-10-CM | POA: Diagnosis not present

## 2023-07-27 DIAGNOSIS — R0789 Other chest pain: Secondary | ICD-10-CM | POA: Diagnosis present

## 2023-07-27 DIAGNOSIS — R072 Precordial pain: Secondary | ICD-10-CM | POA: Diagnosis not present

## 2023-07-27 DIAGNOSIS — Z7982 Long term (current) use of aspirin: Secondary | ICD-10-CM | POA: Diagnosis not present

## 2023-07-27 DIAGNOSIS — Z7984 Long term (current) use of oral hypoglycemic drugs: Secondary | ICD-10-CM | POA: Diagnosis not present

## 2023-07-27 DIAGNOSIS — I2510 Atherosclerotic heart disease of native coronary artery without angina pectoris: Secondary | ICD-10-CM | POA: Insufficient documentation

## 2023-07-27 DIAGNOSIS — Z951 Presence of aortocoronary bypass graft: Secondary | ICD-10-CM | POA: Diagnosis not present

## 2023-07-27 DIAGNOSIS — R079 Chest pain, unspecified: Secondary | ICD-10-CM | POA: Insufficient documentation

## 2023-07-27 DIAGNOSIS — Z79899 Other long term (current) drug therapy: Secondary | ICD-10-CM | POA: Insufficient documentation

## 2023-07-27 LAB — BASIC METABOLIC PANEL
Anion gap: 9 (ref 5–15)
BUN: 5 mg/dL — ABNORMAL LOW (ref 8–23)
CO2: 25 mmol/L (ref 22–32)
Calcium: 9.1 mg/dL (ref 8.9–10.3)
Chloride: 105 mmol/L (ref 98–111)
Creatinine, Ser: 0.7 mg/dL (ref 0.44–1.00)
GFR, Estimated: >60 mL/min (ref >60)
Glucose, Bld: 133 mg/dL — ABNORMAL HIGH (ref 70–99)
Potassium: 4.1 mmol/L (ref 3.5–5.1)
Sodium: 139 mmol/L (ref 135–145)

## 2023-07-27 LAB — CBC
HCT: 40.8 % (ref 36.0–46.0)
Hemoglobin: 12.8 g/dL (ref 12.0–15.0)
MCH: 28.1 pg (ref 26.0–34.0)
MCHC: 31.4 g/dL (ref 30.0–36.0)
MCV: 89.5 fL (ref 80.0–100.0)
Platelets: 184 K/uL (ref 150–400)
RBC: 4.56 MIL/uL (ref 3.87–5.11)
RDW: 14.5 % (ref 11.5–15.5)
WBC: 5 K/uL (ref 4.0–10.5)
nRBC: 0 % (ref 0.0–0.2)

## 2023-07-27 LAB — TROPONIN I (HIGH SENSITIVITY)
Troponin I (High Sensitivity): 5 ng/L (ref <18)
Troponin I (High Sensitivity): 5 ng/L (ref <18)

## 2023-07-27 NOTE — Consult Note (Addendum)
Cardiology Consultation   Patient ID: ZHOIE MCKEE MRN: [id number]; DOB: 1951/04/11  Admit date: 07/27/2023 Date of Consult: 07/27/2023  PCP:  [name], MD   [facility] Providers Cardiologist:  Donato Schultz, MD   Patient Profile:   Patricia Cuevas is a 72 y.o. female with a hx of CAD s/p CABG 2007, DM, asthma, alcohol abuse, hyperlipidemia, history of DVT, obesity, and OSA not on CPAP who is being seen 07/27/2023 for the evaluation of chest pain at the request of Dr. Lockie Mola.  History of Present Illness:   Patricia Cuevas has a prior history of CAD dating back to her CABG x 4 in 2007.  Unfortunately she receives a lot of her care in the ER with frequent visits without admissions.  CAD with CABG x 4 in 2007: LIMA-LAD, SVG-PDA-PLA, SVG-OM1-OM2, SVG-Diagonal.  Heart catheterization in 2011 and 2017 showed 3 out of 4 grafts patent with an occluded SVG-diagonal.  Heart catheterization in 2017 followed an abnormal nuclear stress test, but showed no change in disease from 2011 LHC.  No culprit lesion to explain the abnormal stress test.    She was hospitalized a [facility] for chest pain and underwent nuclear stress test 09/29/22 that was unremarkable. Due to ongoing chest pain, she underwent repeat nuclear stress test 10/21/2022 that did not appear different than her prior study.  No further ischemic evaluation was planned at that time.  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 She was seen in the ER there 02/25/23 with chest pain, hs troponin 20 --> 27. She reportedly did not know why she takes imdur. Due to history of bradycardia, cardiology hesitant to increase BB.  PTA: ASA, 30 mg imdur, 25 mg lopressor BID, lisinopril-hydrochlorothiazide 20-25 mg, 10 mg crestor.   She presented to [facility] today 07/27/23 with chest pain that stated at 1:30 AM. CP was very brief, less  than 3 seconds, but she took NTG x 2 and proceeded to the ER.  HS troponin 5, delta pending  During my interview she states that she was already awake at 1:30 AM when she developed a sharp 7/10 chest pain in her right chest that lasted less than 3 seconds. She became mildly nauseous, but no SOB or diaphoresis. She no longer had chest pain but "didn't feel right" so she took 2 nitroglycerin tablets at the same time and proceeded to the ER.   On arrival, she was mildly hypertensive. She reports compliance with medications. EKG with sinus bradycardia and sinus arrhythmia with nonspecific T wave changes and low voltage in V6.  She denies current chest pain, has had no recurrence since her initial event. This chest pain is not like her usual chest pain - CP earlier this year felt like a fist pressing on her, this was described as a brief sharp CP.  She denies recent illness and weight gain. No changes in medications. She smokes 0.5 cigarettes per day.   Past Medical History:  Diagnosis Date   Alcohol abuse    in the past, resolved   Arthritis    Asthma    Breast cancer (HCC)    Double mastectomy 2007; s/p tamoxifen and arimidex therapy; no recurrence since 05/2008   CAD (coronary artery disease)    Nuclear stress test Feb 11, 2011, EF 64%, no scar or ischemia    //  catheterization, November, 2011,patent LIMA-LAD-small caliber distal vessel with diffuse plaque, patent SVG to OM1 and OM 2, patent SVG to PDA and PLA, occluded SVG to diagonal, medical therapy;  Lexiscan Myoview 6/14:  Inferior thinning, no ischemia, EF 61%, low risk   CHF (congestive heart failure) (HCC)    Chronic back pain    Diabetes mellitus    Hx of colonic polyps    Hyperlipidemia    Hypertension    Myocardial infarction (HCC)    Nausea & vomiting    hospitalization, November, 2011, stricture GE junction, questionable stenosis gastrojejunostomy, disruptive primary peristaltic wave, GE reflux, delayed emptying proximal  gastric pouch   Overweight(278.02)    stomach stapling 1985 followed by weight loss, then return of weight   Sciatica    Sleep apnea    CPAP being arranged May, 2011//does not use CPAP   Tobacco abuse    in the past, resolved   UTI (urinary tract infection)     Past Surgical History:  Procedure Laterality Date   ABDOMINAL HYSTERECTOMY  1989   CARDIAC CATHETERIZATION N/A 12/20/2015   Procedure: Left Heart Cath and Cors/Grafts Angiography;  Surgeon: Marykay Lex, MD;  Location: [facility] INVASIVE CV LAB;  Service: Cardiovascular;  Laterality: N/A;   CORONARY ARTERY BYPASS GRAFT  2009   CORONARY STENT PLACEMENT     GASTRIC BYPASS  1983   open.  [place]   MASTECTOMY Bilateral 1987     Home Medications:  Prior to Admission medications   Medication Sig Start Date End Date Taking? Authorizing Provider  albuterol (PROVENTIL HFA;VENTOLIN HFA) 108 (90 BASE) MCG/ACT inhaler Inhale 2 puffs into the lungs every 6 (six) hours as needed for shortness of breath.   Yes [provider]  aspirin EC 81 MG tablet Take 81 mg by mouth daily.    Yes [provider]  cholecalciferol (VITAMIN D3) 25 MCG (1000 UT) tablet Take 1,000 Units by mouth daily.   Yes [provider]  fluticasone (FLONASE) 50 MCG/ACT nasal spray Place 2 sprays into both nostrils daily. Patient taking differently: Place 2 sprays into both nostrils daily as needed for allergies. 08/16/17  Yes Maczis, Elmer Sow, PA-C  glipiZIDE (GLUCOTROL) 10 MG tablet Take 10 mg by mouth every morning. 12/06/20  Yes [provider]  HYDROcodone-acetaminophen (NORCO) 10-325 MG tablet Take 1 tablet by mouth 2 (two) times daily as needed for moderate pain (pain score 4-6).   Yes [provider]  isosorbide mononitrate (IMDUR) 30 MG 24 hr tablet Take 1 tablet (30 mg total) by mouth daily. 10/26/22  Yes Jake Bathe, MD  lisinopril-hydrochlorothiazide (ZESTORETIC) 20-25 MG tablet Take 1 tablet by mouth daily.   Yes  [provider]  magnesium oxide (MAG-OX) 400 MG tablet Take 400 mg by mouth daily as needed (leg cramps). 01/13/21  Yes [provider]  metoprolol tartrate (LOPRESSOR) 25 MG tablet TAKE 1 TABLET BY MOUTH TWICE DAILY 09/30/21  Yes Jake Bathe, MD  nitroGLYCERIN (NITROSTAT) 0.4 MG SL tablet Place 1 tablet (0.4 mg total) under the tongue every 5 (five) minutes as needed for chest pain. 07/22/21  Yes Alver Sorrow, NP  rosuvastatin (CRESTOR) 10 MG tablet Take 1 tablet (10 mg total) by mouth at bedtime. 08/26/18  Yes Jake Bathe, MD    Inpatient Medications: Scheduled Meds:  Continuous Infusions:  PRN Meds:   Allergies:    Allergies  Allergen Reactions   Latex Itching and Rash    Social History:  Social History   Socioeconomic History   Marital status: Single    Spouse name: Not on file   Number of children: Not on file   Years of education: Not on file   Highest education level: Not on file  Occupational History   Occupation: Disabled  Tobacco Use   Smoking status: Former    Current packs/day: 0.00    Average packs/day: 3.0 packs/day for 27.0 years (81.0 ttl pk-yrs)    Types: Cigarettes    Start date: 09/14/1973    Quit date: 09/14/2000    Years since quitting: 22.8   Smokeless tobacco: Never  Vaping Use   Vaping status: Never Used  Substance and Sexual Activity   Alcohol use: No   Drug use: No   Sexual activity: Not on file  Other Topics Concern   Not on file  Social History Narrative   Not on file   Social Determinants of Health   Financial Resource Strain: Not on file  Food Insecurity: Low Risk  (02/25/2023)   Received from [facility]   Hunger Vital Sign    Worried About Running Out of Food in the Last Year: Never true    Ran Out of Food in the Last Year: Never true  Recent Concern: Food Insecurity - Medium Risk (01/01/2023)   Received from [facility]   Food vital sign    Within the past 12 months, you worried that your food  would run out before you got money to buy more: Sometimes true    Within the past 12 months, the food you bought just didn't last and you didn't have money to get more. : Sometimes true  Transportation Needs: Not on file (02/25/2023)  Physical Activity: Not on file  Stress: Not on file  Social Connections: Not on file  Intimate Partner Violence: Low Risk  (09/28/2022)   [facility] from [facility] prior to 11/14/2022., [facility] visits prior to 11/14/2022.   Safety    How often does anyone, including family and friends, physically hurt you?: Never    How often does anyone, including family and friends, insult or talk down to you?: Never    How often does anyone, including family and friends, threaten you with harm?: Never    How often does anyone, including family and friends, scream or curse at you?: Never    Family History:    Family History  Problem Relation Age of Onset   Hypertension Mother    Heart disease Mother    Diabetes Mother    Heart disease Father    Colon cancer Father        does not know age of onset   Heart disease Sister    Colon cancer Sister 18   Stomach cancer Sister    Diabetes Sister    Asthma Sister    Diabetes Sister    Esophageal cancer Neg Hx    Rectal cancer Neg Hx      ROS:  Please see the history of present illness.   All other ROS reviewed and negative.     Physical Exam/Data:   Vitals:   07/27/23 0935 07/27/23 0946 07/27/23 1018  BP: (!) 144/68  132/67  Pulse: 63  (!) 56  Resp: 18  18  Temp: 98 F (36.7 C)  98.8 F (37.1 C)  TempSrc: Oral  Oral  SpO2: 93%  97%  Weight:  90.7 kg    No intake or  output data in the 24 hours ending 07/27/23 1133    07/27/2023    9:46 AM 12/01/2022   11:49 AM 10/21/2022   10:08 AM  Last 3 Weights  Weight (lbs) 200 lb 202 lb 192 lb  Weight (kg) 90.719 kg 91.627 kg 87.091 kg     Body mass index is 41.8 kg/m.  General:  obese female in NAD HEENT:  normal Neck: no JVD Vascular: No carotid bruits; Distal pulses 2+ bilaterally Cardiac:  regular rhythm, bradycardic rate Lungs:  crackle in right base, otherwise clear  Abd: soft, nontender, no hepatomegaly  Ext: no edema Musculoskeletal:  No deformities, BUE and BLE strength normal and equal Skin: warm and dry  Neuro:  CNs 2-12 intact, no focal abnormalities noted Psych:  Normal affect   EKG:  The EKG was personally reviewed and demonstrates:  sinus bradycardia with HR 56 with nonspecific T wave changes and low voltage in V6 Telemetry:  Telemetry was personally reviewed and demonstrates:  N/A  Relevant CV Studies:  Nuclear stress test 10/2022   Findings are consistent with no ischemia. The study is low risk.   No ST deviation was noted.   LV perfusion is abnormal. Defect 1: There is a medium defect with mild reduction in uptake present in the mid to basal inferior and inferolateral location(s) that is fixed. There is normal wall motion in the defect area.   Left ventricular function is normal. Nuclear stress EF: 76 %. The left ventricular ejection fraction is hyperdynamic (>65%). End diastolic cavity size is normal. End systolic cavity size is normal.   Prior study available for comparison from 12/13/2015. No changes compared to prior study.   There is a mild defect in the basal to mid inferior and inferolateral wall that improves with stress. This was seen on prior study as well, and cath done after that study did not find culprit lesion. Cannot determine if this is artifact vs. Hibernating myocardium based on available data. Would consider cardiac PET in the future if testing needed.   Left heart cath 12/2015: Prox RCA to Mid RCA lesion, 55% stenosed. Mid RCA to Dist RCA lesion, 100% stenosed. Mild disease of the Ostial-Prox SVG-dRCA(filling PLA & PDA). Mid LAD lesion, 100% stenosed. LIMA-LAD is patent, but very small to a small distal LAD. Very tortuous graft - not a good option to  attempt PCI through. Prox Cx to Mid Cx lesion, 100% stenosed. The lesion was previously treated with a stent (unknown type) greater than two years ago. Mid Cx lesion, 100% stenosed. Patent SVG-OM2 & OM3. Origin to Prox SVG-Diag Graft lesion, 100% stenosed. Known. Sequential SVG-OM2-OM3 was injected is large & widely patent. The left ventricular systolic function is normal.     No real change in anatomy from 2011 catheterization. No culprit lesion to explain the patient's stress test.   Normal EF with normal EDP and stable coronary arteries. She should be at low risk from a cardiovascular standpoint for her surgery from a revascularization standpoint.     Plan: Standard post sheath removal. Anticipate discharge later today. Continue medical management.   Laboratory Data:  High Sensitivity Troponin:  No results for input(s): "TROPONINIHS" in the last 720 hours.   ChemistryNo results for input(s): "NA", "K", "CL", "CO2", "GLUCOSE", "BUN", "CREATININE", "CALCIUM", "MG", "GFRNONAA", "GFRAA", "ANIONGAP" in the last 168 hours.  No results for input(s): "PROT", "ALBUMIN", "AST", "ALT", "ALKPHOS", "BILITOT" in the last 168 hours. Lipids No results for input(s): "CHOL", "TRIG", "HDL", "LABVLDL", "LDLCALC", "CHOLHDL"  in the last 168 hours.  Hematology Recent Labs  Lab 07/27/23 0956  WBC 5.0  RBC 4.56  HGB 12.8  HCT 40.8  MCV 89.5  MCH 28.1  MCHC 31.4  RDW 14.5  PLT 184   Thyroid No results for input(s): "TSH", "FREET4" in the last 168 hours.  BNPNo results for input(s): "BNP", "PROBNP" in the last 168 hours.  DDimer No results for input(s): "DDIMER" in the last 168 hours.   Radiology/Studies:  No results found.   Assessment and Plan:   Chest pain CAD s/p CABG x 4 2007 with 3/4 patent stents Nuclear stress test 10/2022 unchanged from stress test 2017 that resulted in heart catheterization in 2017 that showed no progression of disease and 3/4 patent stents.  -- chest pain sounds  atypical- lasting less than 3 seconds, no recurrence -- initial troponin negative -- she is exquisitely tender  in that she recoiled when pressing on her right chest but reported to me palpation did not elicit her chest pain -- will await delta troponin  - I think that if troponin rises or she has a recurrence in CP, we may need to evaluate her with definitive angiograph - last cath in 2017 with 2 recent nuclear stress tests -- decisions regarding repeat heart cath and possible need for DAPT complicated by recent (12/2022 GI bleed) -- plan to update an echocardiogram, if second troponin is negative, may consider doing this outpatient -- may need to increase imdur vs adding ranexa to titrate anti-anginal regimen   Hypertension Records indicate she is taking 30 mg imdur and lisinopril-hydrochlorothiazide - hypertensive on arrival but has not had AM medications today - would increase imdur to possibly 30 mg BID to avoid hypotension and better coverage for anti-anginal effects, can also consider ranexa as above   Hyperlipidemia with LDL goal < 70 Needs updated lipid panel and LP(a) - only on 10 mg crestor - if she is unable to tolerate higher doses, may need to consider PCSK9i or inclisiran to increase compliance   Obesity OSA not on CPAP Minimal smoking - advised complete smoking cessation    Risk Assessment/Risk Scores:      For questions or updates, please contact [facility] Please consult [email or web address] for contact info under    Signed, Marcelino Duster, PA  07/27/2023 11:33 AM  I have seen and examined the patient along with Marcelino Duster, PA .  I have reviewed the chart, notes and new data.  I agree with PA/NP's note.  Key new complaints: chest pain very brief and different from previous angina Key examination changes: normal CV exam Key new findings / data: low risk ECG and biomarkers. The nuclear stress test a few months ago was low risk.  PLAN: No  plan for invasive evaluation. Not unreasonable to increase long acting nitrates if symptoms happen again, but suspect this was not coronary disease related pain. Will make sure she has appropriate outpatient follow up.   Thurmon Fair, MD, [facility] [phone] 07/27/2023, 2:02 PM

## 2023-07-27 NOTE — Discharge Instructions (Signed)
Follow-up with cardiology team outpatient.  They should be calling you with a follow-up appointment.
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
## 2023-07-27 NOTE — ED Notes (Signed)
Pt ambulatory to bathroom

## 2023-07-27 NOTE — ED Provider Notes (Signed)
[place] EMERGENCY DEPARTMENT AT [facility] Provider Note   CSN: [id number] Arrival date & time: 07/27/23  0930     History  Chief Complaint  Patient presents with   Chest Pain    Patricia Cuevas is a 72 y.o. female.  Patient here after episode of chest discomfort that occurred around 1 AM this morning.  She got up took 2 nitroglycerin and pain improved and then pain came back shortly afterwards where she took an aspirin.  Pains been improved felt a little bit of discomfort continuing this morning and came for evaluation.  Right now pains pretty much gone.  She denies any recent surgery or travel.  She has history of CAD and bypass surgery.  She denies any cough or sputum production.  She states does not feel like reflux.  She denies shortness of breath.  No leg swelling.  History of hypertension diabetes tobacco use as well in the past.  She had a hospital stay for chest pain few months ago [facility].  Sounds like she has had 2 stress test recently this year.  The history is provided by the patient.       Home Medications Prior to Admission medications   Medication Sig Start Date End Date Taking? Authorizing Provider  albuterol (PROVENTIL HFA;VENTOLIN HFA) 108 (90 BASE) MCG/ACT inhaler Inhale 2 puffs into the lungs every 6 (six) hours as needed for shortness of breath.   Yes [provider]  aspirin EC 81 MG tablet Take 81 mg by mouth daily.    Yes [provider]  cholecalciferol (VITAMIN D3) 25 MCG (1000 UT) tablet Take 1,000 Units by mouth daily.   Yes [provider]  fluticasone (FLONASE) 50 MCG/ACT nasal spray Place 2 sprays into both nostrils daily. Patient taking differently: Place 2 sprays into both nostrils daily as needed for allergies. 08/16/17  Yes Maczis, Elmer Sow, PA-C  glipiZIDE (GLUCOTROL) 10 MG tablet Take 10 mg by mouth every morning. 12/06/20  Yes [provider]  HYDROcodone-acetaminophen (NORCO)  10-325 MG tablet Take 1 tablet by mouth 2 (two) times daily as needed for moderate pain (pain score 4-6).   Yes [provider]  isosorbide mononitrate (IMDUR) 30 MG 24 hr tablet Take 1 tablet (30 mg total) by mouth daily. 10/26/22  Yes Jake Bathe, MD  lisinopril-hydrochlorothiazide (ZESTORETIC) 20-25 MG tablet Take 1 tablet by mouth daily.   Yes [provider]  magnesium oxide (MAG-OX) 400 MG tablet Take 400 mg by mouth daily as needed (leg cramps). 01/13/21  Yes [provider]  metoprolol tartrate (LOPRESSOR) 25 MG tablet TAKE 1 TABLET BY MOUTH TWICE DAILY 09/30/21  Yes Jake Bathe, MD  nitroGLYCERIN (NITROSTAT) 0.4 MG SL tablet Place 1 tablet (0.4 mg total) under the tongue every 5 (five) minutes as needed for chest pain. 07/22/21  Yes Alver Sorrow, NP  rosuvastatin (CRESTOR) 10 MG tablet Take 1 tablet (10 mg total) by mouth at bedtime. 08/26/18  Yes Jake Bathe, MD      Allergies    Latex    Review of Systems   Review of Systems  Physical Exam Updated Vital Signs BP (!) 157/62   Pulse 60   Temp 98.4 F (36.9 C) (Oral)   Resp 17   Wt 90.7 kg   SpO2 100%   BMI 41.80 kg/m  Physical Exam Vitals and nursing note reviewed.  Constitutional:      General: She is not  in acute distress.    Appearance: She is well-developed. She is not ill-appearing.  HENT:     Head: Normocephalic and atraumatic.  Eyes:     Extraocular Movements: Extraocular movements intact.     Conjunctiva/sclera: Conjunctivae normal.     Pupils: Pupils are equal, round, and reactive to light.  Cardiovascular:     Rate and Rhythm: Normal rate and regular rhythm.     Pulses:          Radial pulses are 2+ on the right side and 2+ on the left side.     Heart sounds: Normal heart sounds. No murmur heard. Pulmonary:     Effort: Pulmonary effort is normal. No respiratory distress.     Breath sounds: Normal breath sounds. No decreased breath sounds, wheezing or rhonchi.   Abdominal:     Palpations: Abdomen is soft.     Tenderness: There is no abdominal tenderness.  Musculoskeletal:        General: No swelling. Normal range of motion.     Cervical back: Normal range of motion and neck supple.     Right lower leg: No edema.     Left lower leg: No edema.  Skin:    General: Skin is warm and dry.     Capillary Refill: Capillary refill takes less than 2 seconds.  Neurological:     General: No focal deficit present.     Mental Status: She is alert.  Psychiatric:        Mood and Affect: Mood normal.     ED Results / Procedures / Treatments   Labs (all labs ordered are listed, but only abnormal results are displayed) Labs Reviewed  BASIC METABOLIC PANEL - Abnormal; Notable for the following components:      Result Value   Glucose, Bld 133 (*)    BUN 5 (*)    All other components within normal limits  CBC  TROPONIN I (HIGH SENSITIVITY)  TROPONIN I (HIGH SENSITIVITY)    EKG EKG Interpretation Date/Time:  Tuesday July 27 2023 09:38:15 EST Ventricular Rate:  56 PR Interval:  134 QRS Duration:  84 QT Interval:  392 QTC Calculation: 378 R Axis:   -1  Text Interpretation: Sinus bradycardia with sinus arrhythmia Nonspecific T wave abnormality Abnormal ECG When compared with ECG of 11-Oct-2022 23:40, PREVIOUS ECG IS PRESENT Confirmed by Virgina Norfolk [id number]) on 07/27/2023 10:06:31 AM  Radiology No results found.  Procedures Procedures    Medications Ordered in ED Medications - No data to display  ED Course/ Medical Decision Making/ A&P                                 Medical Decision Making Amount and/or Complexity of Data Reviewed Labs: ordered. Radiology: ordered.   Patricia Cuevas is here with chest pain.  History of CAD status post CABG, hypertension diabetes.  Normal vitals.  No fever.  EKG per my review and interpretation shows sinus rhythm.  No ischemic changes.  Patient with episode of chest discomfort around 9 hours ago  that woke her from her sleep.  She took nitro glycerin x 2 and helped.  She woke up another hour or so later and felt pain again and took aspirin.  She has had a little bit of discomfort throughout the morning but now mostly resolved but decided come for evaluation.  She had [facility] where she had some positive troponins and had a stress test that was unremarkable.  She has not had a heart catheterization in several years.  She had a stress test with her cardiologist also in January that was unremarkable.  Vital signs are reassuring.  She has had no PE risk factors.  No infectious symptoms.  Differential diagnosis could be atypical pain from GI process or muscular process or some other stressor but could be ACS given her history.  Have no concern for PE or infectious process at this time.  Will get CBC BMP troponin and chest x-ray and talk with cardiology team.  Lab work per my review and interpretation is unremarkable.  Troponin negative x 2.  No significant anemia or electrolyte abnormality kidney injury or leukocytosis.  No pneumonia or pneumothorax on chest x-ray per my review and interpretation.  Overall patient follow-up with cardiology outpatient.  We discussed may be making some blood pressure medication adjustments but will have her follow-up outpatient to see what her blood pressure is there.  It has been fairly controlled here.  Overall she is not having any chest pain.  I have no concern for PE other emergent process.  Discharged in good condition.  Understands return precautions.  This chart was dictated using voice recognition software.  Despite best efforts to proofread,  errors can occur which can change the documentation meaning.         Final Clinical Impression(s) / ED Diagnoses Final diagnoses:  Nonspecific chest pain    Rx / DC Orders ED Discharge Orders     None         Virgina Norfolk, DO 07/27/23 1430

## 2023-07-27 NOTE — ED Notes (Signed)
Patient discharged by this RN. Patient verbalizes understanding of instructions with no additional questions for this RN. Patient in wheelchair to lobby with family member.

## 2023-08-17 ENCOUNTER — Encounter: Payer: Self-pay | Admitting: Cardiology

## 2023-08-17 ENCOUNTER — Ambulatory Visit: Payer: 59 | Attending: Cardiology | Admitting: Cardiology

## 2023-08-17 VITALS — BP 138/82 | HR 56 | Resp 16 | Ht <= 58 in | Wt 197.0 lb

## 2023-08-17 DIAGNOSIS — Z951 Presence of aortocoronary bypass graft: Secondary | ICD-10-CM | POA: Diagnosis not present

## 2023-08-17 DIAGNOSIS — I209 Angina pectoris, unspecified: Secondary | ICD-10-CM

## 2023-08-17 DIAGNOSIS — I25118 Atherosclerotic heart disease of native coronary artery with other forms of angina pectoris: Secondary | ICD-10-CM | POA: Diagnosis not present

## 2023-08-17 NOTE — Progress Notes (Signed)
Cardiology Office Note:  .   Date:  08/17/2023  ID:  [name], DOB 1951/07/18, MRN [id number] PCP: [name], MD  [facility] Providers Cardiologist:  Donato Schultz, MD     History of Present Illness: [name] Kitchen   Patricia Cuevas is a 72 y.o. female Discussed with the use of AI scribe  History of Present Illness   The patient, a 72 year old with a history of coronary artery disease, presented for a follow-up visit. She underwent bypass surgery in 2007 and a cardiac catheterization in 2017, which revealed patent lima to LAD with small caliber vessel distally, diffuse plaque within SVG, patent to OM and OM2, patent SVG to PDA and PLA, and occluded SVG to diagonal. The patient has a history of seeking care in the emergency department for abdominal and chest pain.  In February 2024, a Lexiscan stress test was performed, which showed no ischemia and was unchanged from prior, indicating overall low risk. The ejection fraction was normal with a medium defect in the mid to basal inferior and inferior lateral location. Following the stress test, the patient was prescribed isosorbide 30 mg once a day.  In November 2024, the patient visited the emergency room again, but the workup was unremarkable with negative troponin. The patient reported feeling much better at the time of the current visit. She has been taking the prescribed isosorbide regularly, which she reported as being more helpful than previous medications.  The patient's medication regimen also includes aspirin 81 mg, lisinopril/hydrochlorothiazide 20/25 mg, metoprolol tartrate 25 mg twice a day, and Crestor 10 mg for cholesterol. The patient's LDL levels have been satisfactory on this regimen.  The patient enjoys walking and exercising, although she reported that she can only manage one block at a time. Despite this, she has been trying to walk every day. The patient's diet was not discussed in detail, but she reported eating a little bit  of everything.           Studies Reviewed: .        Results LABS Troponin: Negative (07/27/2023)  DIAGNOSTIC Cardiac catheterization: Patent LIMA to LAD with small caliber vessel distally, diffuse plaque within SVG, patent to OM and OM2, patent SVG to PDA and PLA, occluded SVG to diagonal (2017) Lexiscan stress test: Unchanged from prior, showing no ischemia, overall low risk, normal ejection fraction with a medium defect in the mid to basal inferior and inferolateral location (10/21/2022)  Risk Assessment/Calculations:            Physical Exam:   VS:  BP 138/82 (BP Location: Right Arm, Patient Position: Sitting, Cuff Size: Large)   Pulse (!) 56   Resp 16   Ht 4\' 10"  (1.473 m)   Wt 197 lb (89.4 kg)   SpO2 97%   BMI 41.17 kg/m    Wt Readings from Last 3 Encounters:  08/17/23 197 lb (89.4 kg)  07/27/23 200 lb (90.7 kg)  12/01/22 202 lb (91.6 kg)    GEN: Well nourished, well developed in no acute distress NECK: No JVD; No carotid bruits CARDIAC: RRR, no murmurs, no rubs, no gallops RESPIRATORY:  Clear to auscultation without rales, wheezing or rhonchi  ABDOMEN: Soft, non-tender, non-distended EXTREMITIES:  No edema; No deformity   ASSESSMENT AND PLAN: .    Assessment and Plan    Coronary Artery Disease (CAD)   CAD with bypass surgery in 2007 and cardiac catheterization in 2017 showing patent grafts and occluded SVG to diagonal. Lexiscan stress  test on 10/21/2022 showed no ischemia, low risk, normal ejection fraction, and medium defect in the mid to basal inferior and inferior lateral location. ER visit on 07/27/2023 for chest pain showed negative troponin and unremarkable workup. Currently well-managed on isosorbide 30 mg daily, aspirin 81 mg, lisinopril/hydrochlorothiazide 20/25 mg, metoprolol tartrate 25 mg twice daily, and Crestor 10 mg. Discussed the importance of continuing current medications and regular exercise. She prefers to continue current regimen and is  compliant with medications.   - Continue isosorbide 30 mg daily   - Continue aspirin 81 mg daily   - Continue lisinopril/hydrochlorothiazide 20/25 mg daily   - Continue metoprolol tartrate 25 mg twice daily   - Continue Crestor 10 mg daily   - Encourage regular walking and exercise   - Schedule follow-up visit in one year    General Health Maintenance   Maintaining a healthy lifestyle with regular walking and exercise. No changes needed in diet or activity level.   - Encourage continued regular walking and exercise   - Advise caution in cold weather to prevent falls    Follow-up   - Schedule follow-up visit in one year.               Signed, Donato Schultz, MD

## 2023-08-17 NOTE — Patient Instructions (Signed)

## 2023-12-20 ENCOUNTER — Emergency Department (HOSPITAL_BASED_OUTPATIENT_CLINIC_OR_DEPARTMENT_OTHER)
Admission: EM | Admit: 2023-12-20 | Discharge: 2023-12-21 | Disposition: A | Discharge status: Home / Self Care | Attending: Emergency Medicine | Admitting: Emergency Medicine

## 2023-12-20 ENCOUNTER — Other Ambulatory Visit: Payer: Self-pay

## 2023-12-20 ENCOUNTER — Emergency Department (HOSPITAL_BASED_OUTPATIENT_CLINIC_OR_DEPARTMENT_OTHER)

## 2023-12-20 ENCOUNTER — Encounter (HOSPITAL_BASED_OUTPATIENT_CLINIC_OR_DEPARTMENT_OTHER): Payer: Self-pay | Admitting: Emergency Medicine

## 2023-12-20 DIAGNOSIS — Z7982 Long term (current) use of aspirin: Secondary | ICD-10-CM | POA: Insufficient documentation

## 2023-12-20 DIAGNOSIS — J069 Acute upper respiratory infection, unspecified: Secondary | ICD-10-CM | POA: Diagnosis not present

## 2023-12-20 DIAGNOSIS — Z91040 Latex allergy status: Secondary | ICD-10-CM | POA: Insufficient documentation

## 2023-12-20 DIAGNOSIS — R059 Cough, unspecified: Secondary | ICD-10-CM | POA: Diagnosis present

## 2023-12-20 LAB — BASIC METABOLIC PANEL WITH GFR
Anion gap: 8 (ref 5–15)
BUN: 16 mg/dL (ref 8–23)
CO2: 25 mmol/L (ref 22–32)
Calcium: 8.7 mg/dL — ABNORMAL LOW (ref 8.9–10.3)
Chloride: 106 mmol/L (ref 98–111)
Creatinine, Ser: 0.82 mg/dL (ref 0.44–1.00)
GFR, Estimated: >60 mL/min (ref >60)
Glucose, Bld: 178 mg/dL — ABNORMAL HIGH (ref 70–99)
Potassium: 4 mmol/L (ref 3.5–5.1)
Sodium: 139 mmol/L (ref 135–145)

## 2023-12-20 LAB — CBC WITH DIFFERENTIAL/PLATELET
Abs Immature Granulocytes: 0.01 10*3/uL (ref 0.00–0.07)
Basophils Absolute: 0 10*3/uL (ref 0.0–0.1)
Basophils Relative: 0 %
Eosinophils Absolute: 0 10*3/uL (ref 0.0–0.5)
Eosinophils Relative: 1 %
HCT: 40 % (ref 36.0–46.0)
Hemoglobin: 12.9 g/dL (ref 12.0–15.0)
Immature Granulocytes: 0 %
Lymphocytes Relative: 45 %
Lymphs Abs: 3.2 10*3/uL (ref 0.7–4.0)
MCH: 28.9 pg (ref 26.0–34.0)
MCHC: 32.3 g/dL (ref 30.0–36.0)
MCV: 89.5 fL (ref 80.0–100.0)
Monocytes Absolute: 0.5 10*3/uL (ref 0.1–1.0)
Monocytes Relative: 7 %
Neutro Abs: 3.3 10*3/uL (ref 1.7–7.7)
Neutrophils Relative %: 47 %
Platelets: 159 10*3/uL (ref 150–400)
RBC: 4.47 MIL/uL (ref 3.87–5.11)
RDW: 14.7 % (ref 11.5–15.5)
WBC: 7.1 10*3/uL (ref 4.0–10.5)
nRBC: 0 % (ref 0.0–0.2)

## 2023-12-20 LAB — RESP PANEL BY RT-PCR (RSV, FLU A&B, COVID)  RVPGX2
Influenza A by PCR: NEGATIVE
Influenza B by PCR: NEGATIVE
Resp Syncytial Virus by PCR: NEGATIVE
SARS Coronavirus 2 by RT PCR: NEGATIVE

## 2023-12-20 LAB — TROPONIN I (HIGH SENSITIVITY)
Troponin I (High Sensitivity): 3 ng/L (ref <18)
Troponin I (High Sensitivity): 4 ng/L (ref <18)

## 2023-12-20 MED ORDER — BENZONATATE 100 MG PO CAPS: 100.0000 mg | ORAL_CAPSULE | Freq: Three times a day (TID) | ORAL | 0 refills | Status: AC

## 2023-12-20 MED ORDER — AMOXICILLIN-POT CLAVULANATE 875-125 MG PO TABS: 1.0000 | ORAL_TABLET | Freq: Two times a day (BID) | ORAL | 0 refills | Status: AC

## 2023-12-20 MED ORDER — ONDANSETRON 4 MG PO TBDP: ORAL_TABLET | ORAL | 0 refills | Status: AC

## 2023-12-20 NOTE — ED Triage Notes (Signed)
 Pt states she has been having a productive cough for 2 days.  Pt states today her chest has been hurting with coughing.  No known fever.

## 2023-12-20 NOTE — ED Provider Notes (Signed)
 [place] [facility] AT [facility] Provider Note   CSN: [id number] Arrival date & time: 12/20/23  1806     History  Chief Complaint  Patient presents with   URI   Cough   Chest Pain    Patricia Cuevas is a 73 y.o. female.  73 yo F with a chief complaints of cough congestion going on for couple days.  Patient has a granddaughter who is sick with something similar.  She said that she was diagnosed with a cold.  She has had a little bit of chest discomfort with coughing.  Denies nausea vomiting or diarrhea.  Denies ear pain denies sore throat.   URI Presenting symptoms: cough   Cough Associated symptoms: chest pain   Chest Pain Associated symptoms: cough        Home Medications Prior to Admission medications   Medication Sig Start Date End Date Taking? Authorizing Provider  amoxicillin-clavulanate (AUGMENTIN) 875-125 MG tablet Take 1 tablet by mouth every 12 (twelve) hours. 12/20/23  Yes Melene Plan, DO  benzonatate (TESSALON) 100 MG capsule Take 1 capsule (100 mg total) by mouth every 8 (eight) hours. 12/20/23  Yes Melene Plan, DO  ondansetron (ZOFRAN-ODT) 4 MG disintegrating tablet 4mg  ODT q4 hours prn nausea/vomit 12/20/23  Yes Melene Plan, DO  albuterol (PROVENTIL HFA;VENTOLIN HFA) 108 (90 BASE) MCG/ACT inhaler Inhale 2 puffs into the lungs every 6 (six) hours as needed for shortness of breath.    [provider]  aspirin EC 81 MG tablet Take 81 mg by mouth daily.     [provider]  cholecalciferol (VITAMIN D3) 25 MCG (1000 UT) tablet Take 1,000 Units by mouth daily.    [provider]  fluticasone (FLONASE) 50 MCG/ACT nasal spray Place 2 sprays into both nostrils daily. Patient taking differently: Place 2 sprays into both nostrils daily as needed for allergies. 08/16/17   Maczis, Elmer Sow, PA-C  glipiZIDE (GLUCOTROL) 10 MG tablet Take 10 mg by mouth every morning. 12/06/20   [provider]  HYDROcodone-acetaminophen  (NORCO) 10-325 MG tablet Take 1 tablet by mouth 2 (two) times daily as needed for moderate pain (pain score 4-6).    [provider]  isosorbide mononitrate (IMDUR) 30 MG 24 hr tablet Take 1 tablet (30 mg total) by mouth daily. 10/26/22   Jake Bathe, MD  lisinopril-hydrochlorothiazide (ZESTORETIC) 20-25 MG tablet Take 1 tablet by mouth daily.    [provider]  magnesium oxide (MAG-OX) 400 MG tablet Take 400 mg by mouth daily as needed (leg cramps). 01/13/21   [provider]  metoprolol tartrate (LOPRESSOR) 25 MG tablet TAKE 1 TABLET BY MOUTH TWICE DAILY 09/30/21   Jake Bathe, MD  nitroGLYCERIN (NITROSTAT) 0.4 MG SL tablet Place 1 tablet (0.4 mg total) under the tongue every 5 (five) minutes as needed for chest pain. 07/22/21   Alver Sorrow, NP  rosuvastatin (CRESTOR) 10 MG tablet Take 1 tablet (10 mg total) by mouth at bedtime. 08/26/18   Jake Bathe, MD      Allergies    Latex    Review of Systems   Review of Systems  Respiratory:  Positive for cough.   Cardiovascular:  Positive for chest pain.    Physical Exam Updated Vital Signs BP (!) 147/74 (BP Location: Left Arm)   Pulse 67   Temp 97.9 F (36.6 C) (Oral)   Resp 17   Ht 4\' 10"  (1.473 m)   Wt 93 kg  SpO2 97%   BMI 42.85 kg/m  Physical Exam Vitals and nursing note reviewed.  Constitutional:      General: She is not in acute distress.    Appearance: She is well-developed. She is not diaphoretic.  HENT:     Head: Normocephalic and atraumatic.     Comments: Swollen turbinates, posterior nasal drip, right frontal sinuses tender to percussion, tm normal bilaterally.   Eyes:     Pupils: Pupils are equal, round, and reactive to light.  Cardiovascular:     Rate and Rhythm: Normal rate and regular rhythm.     Heart sounds: No murmur heard.    No friction rub. No gallop.  Pulmonary:     Effort: Pulmonary effort is normal.     Breath sounds: No wheezing or rales.  Abdominal:      General: There is no distension.     Palpations: Abdomen is soft.     Tenderness: There is no abdominal tenderness.  Musculoskeletal:        General: No tenderness.     Cervical back: Normal range of motion and neck supple.  Skin:    General: Skin is warm and dry.  Neurological:     Mental Status: She is alert and oriented to person, place, and time.  Psychiatric:        Behavior: Behavior normal.     ED Results / Procedures / Treatments   Labs (all labs ordered are listed, but only abnormal results are displayed) Labs Reviewed  BASIC METABOLIC PANEL WITH GFR - Abnormal; Notable for the following components:      Result Value   Glucose, Bld 178 (*)    Calcium 8.7 (*)    All other components within normal limits  RESP PANEL BY RT-PCR (RSV, FLU A&B, COVID)  [id number]  CBC WITH DIFFERENTIAL/PLATELET  TROPONIN I (HIGH SENSITIVITY)  TROPONIN I (HIGH SENSITIVITY)    EKG None  Radiology DG Chest 2 View Result Date: 12/20/2023 CLINICAL DATA:  Shortness of breath and cough. EXAM: CHEST - 2 VIEW COMPARISON:  July 27, 2023 FINDINGS: Multiple sternal wires and vascular clips are noted. The heart size and mediastinal contours are within normal limits. Low lung volumes are seen with stable mild to moderate severity elevation of the right hemidiaphragm. There is no evidence of an acute infiltrate, pleural effusion or pneumothorax. Radiopaque surgical clips are seen overlying the right axilla. The visualized skeletal structures are unremarkable. IMPRESSION: 1. Evidence of prior median sternotomy/CABG. 2. No acute or active cardiopulmonary disease. Electronically Signed   By: Aram Candela M.D.   On: 12/20/2023 21:00    Procedures Procedures    Medications Ordered in ED Medications - No data to display  ED Course/ Medical Decision Making/ A&P                                 Medical Decision Making Amount and/or Complexity of Data Reviewed ECG/medicine tests:  ordered.  Risk Prescription drug management.   73 yo F with a chief complaints of cough congestion going on for couple days.  She has been around her granddaughter who is a similar illness.  She is well-appearing nontoxic.  She does have some signs and symptoms consistent with sinusitis.  She has 2 troponins here that are negative.  Chest x-ray independently interpreted by me without focal infiltrate or pneumothorax.  No significant electrolyte abnormalities.  Will treat with antibiotics for possible  sinusitis.  Likely viral syndrome.  PCP follow-up.  11:24 PM:  I have discussed the diagnosis/risks/treatment options with the patient.  Evaluation and diagnostic testing in the emergency department does not suggest an emergent condition requiring admission or immediate intervention beyond what has been performed at this time.  They will follow up with PCP. We also discussed returning to the ED immediately if new or worsening sx occur. We discussed the sx which are most concerning (e.g., sudden worsening pain, fever, inability to tolerate by mouth) that necessitate immediate return. Medications administered to the patient during their visit and any new prescriptions provided to the patient are listed below.  Medications given during this visit Medications - No data to display   The patient appears reasonably screen and/or stabilized for discharge and I doubt any other medical condition or other [facility] requiring further screening, evaluation, or treatment in the ED at this time prior to discharge.          Final Clinical Impression(s) / ED Diagnoses Final diagnoses:  Viral URI with cough    Rx / DC Orders ED Discharge Orders          Ordered    benzonatate (TESSALON) 100 MG capsule  Every 8 hours        12/20/23 2320    ondansetron (ZOFRAN-ODT) 4 MG disintegrating tablet        12/20/23 2320    amoxicillin-clavulanate (AUGMENTIN) 875-125 MG tablet  Every 12 hours        12/20/23 2320               Melene Plan, DO 12/20/23 2324

## 2023-12-20 NOTE — Discharge Instructions (Signed)
 Take tylenol 2 pills 4 times a day and motrin 4 pills 3 times a day.  Drink plenty of fluids.  Return for worsening shortness of breath, headache, confusion. Follow up with your family doctor.
